# Patient Record
Sex: Female | Born: 1950 | Race: Black or African American | Hispanic: No | Marital: Married | Smoking: Former smoker
Health system: Southern US, Community
[De-identification: ages and names within clinical notes are randomized; demographics above are authoritative.]

## PROBLEM LIST (undated history)

## (undated) DIAGNOSIS — F329 Major depressive disorder, single episode, unspecified: Secondary | ICD-10-CM

## (undated) DIAGNOSIS — B1920 Unspecified viral hepatitis C without hepatic coma: Secondary | ICD-10-CM

## (undated) DIAGNOSIS — I509 Heart failure, unspecified: Secondary | ICD-10-CM

## (undated) DIAGNOSIS — G4700 Insomnia, unspecified: Secondary | ICD-10-CM

## (undated) DIAGNOSIS — I10 Essential (primary) hypertension: Secondary | ICD-10-CM

## (undated) DIAGNOSIS — F32A Depression, unspecified: Secondary | ICD-10-CM

## (undated) DIAGNOSIS — J45909 Unspecified asthma, uncomplicated: Secondary | ICD-10-CM

## (undated) DIAGNOSIS — Z9981 Dependence on supplemental oxygen: Secondary | ICD-10-CM

## (undated) HISTORY — DX: Essential (primary) hypertension: I10

## (undated) HISTORY — DX: Insomnia, unspecified: G47.00

## (undated) HISTORY — DX: Unspecified viral hepatitis C without hepatic coma: B19.20

## (undated) HISTORY — DX: Unspecified asthma, uncomplicated: J45.909

## (undated) HISTORY — PX: COLONOSCOPY W/ POLYPECTOMY: SHX1380

## (undated) HISTORY — DX: Depression, unspecified: F32.A

## (undated) HISTORY — DX: Dependence on supplemental oxygen: Z99.81

## (undated) HISTORY — DX: Heart failure, unspecified: I50.9

## (undated) HISTORY — DX: Major depressive disorder, single episode, unspecified: F32.9

## (undated) HISTORY — PX: KNEE ARTHROSCOPY: SUR90

---

## 2006-11-01 ENCOUNTER — Emergency Department (HOSPITAL_COMMUNITY): Admission: EM | Admit: 2006-11-01 | Discharge: 2006-11-01 | Payer: Self-pay | Admitting: Emergency Medicine

## 2006-12-10 ENCOUNTER — Emergency Department: Payer: Self-pay | Admitting: Emergency Medicine

## 2006-12-10 ENCOUNTER — Other Ambulatory Visit: Payer: Self-pay

## 2007-02-03 ENCOUNTER — Emergency Department (HOSPITAL_COMMUNITY): Admission: EM | Admit: 2007-02-03 | Discharge: 2007-02-03 | Payer: Self-pay | Admitting: *Deleted

## 2007-03-16 ENCOUNTER — Emergency Department (HOSPITAL_COMMUNITY): Admission: EM | Admit: 2007-03-16 | Discharge: 2007-03-16 | Payer: Self-pay | Admitting: Emergency Medicine

## 2007-03-19 ENCOUNTER — Ambulatory Visit: Payer: Self-pay | Admitting: Cardiology

## 2007-04-06 ENCOUNTER — Ambulatory Visit: Payer: Self-pay

## 2007-04-07 ENCOUNTER — Ambulatory Visit: Payer: Self-pay | Admitting: Cardiology

## 2007-06-27 ENCOUNTER — Other Ambulatory Visit: Payer: Self-pay

## 2007-06-27 ENCOUNTER — Emergency Department: Payer: Self-pay | Admitting: Emergency Medicine

## 2007-08-07 ENCOUNTER — Emergency Department: Payer: Self-pay | Admitting: Emergency Medicine

## 2007-08-07 ENCOUNTER — Other Ambulatory Visit: Payer: Self-pay

## 2007-09-21 ENCOUNTER — Emergency Department: Payer: Self-pay | Admitting: Emergency Medicine

## 2007-10-19 ENCOUNTER — Ambulatory Visit: Payer: Self-pay | Admitting: Internal Medicine

## 2007-10-20 ENCOUNTER — Ambulatory Visit: Payer: Self-pay | Admitting: Gastroenterology

## 2007-11-06 ENCOUNTER — Emergency Department: Payer: Self-pay | Admitting: Internal Medicine

## 2007-12-06 ENCOUNTER — Ambulatory Visit: Payer: Self-pay | Admitting: Gastroenterology

## 2008-04-27 ENCOUNTER — Emergency Department: Payer: Self-pay | Admitting: Emergency Medicine

## 2008-07-16 ENCOUNTER — Encounter: Payer: Self-pay | Admitting: Cardiovascular Disease

## 2008-07-16 ENCOUNTER — Emergency Department: Payer: Self-pay | Admitting: Emergency Medicine

## 2008-11-17 ENCOUNTER — Ambulatory Visit: Payer: Self-pay | Admitting: Internal Medicine

## 2009-02-06 ENCOUNTER — Encounter: Payer: Self-pay | Admitting: Emergency Medicine

## 2009-02-06 ENCOUNTER — Ambulatory Visit: Payer: Self-pay | Admitting: Radiology

## 2009-02-06 ENCOUNTER — Observation Stay (HOSPITAL_COMMUNITY): Admission: AD | Admit: 2009-02-06 | Discharge: 2009-02-07 | Payer: Self-pay | Admitting: Internal Medicine

## 2009-02-06 ENCOUNTER — Ambulatory Visit: Payer: Self-pay | Admitting: Internal Medicine

## 2009-02-07 ENCOUNTER — Encounter: Payer: Self-pay | Admitting: Cardiovascular Disease

## 2009-08-27 ENCOUNTER — Encounter: Payer: Self-pay | Admitting: Cardiovascular Disease

## 2009-08-29 ENCOUNTER — Ambulatory Visit: Payer: Self-pay | Admitting: Internal Medicine

## 2009-10-29 ENCOUNTER — Ambulatory Visit: Payer: Self-pay | Admitting: Cardiovascular Disease

## 2009-10-29 DIAGNOSIS — E785 Hyperlipidemia, unspecified: Secondary | ICD-10-CM

## 2009-10-29 DIAGNOSIS — I700 Atherosclerosis of aorta: Secondary | ICD-10-CM

## 2009-12-10 ENCOUNTER — Ambulatory Visit: Payer: Self-pay | Admitting: Internal Medicine

## 2009-12-11 ENCOUNTER — Emergency Department: Payer: Self-pay | Admitting: Emergency Medicine

## 2010-01-07 ENCOUNTER — Ambulatory Visit: Payer: Self-pay | Admitting: Gastroenterology

## 2010-02-04 ENCOUNTER — Ambulatory Visit: Payer: Self-pay

## 2010-02-15 ENCOUNTER — Inpatient Hospital Stay: Payer: Self-pay | Admitting: Family Medicine

## 2010-02-19 NOTE — Assessment & Plan Note (Signed)
Summary: NP6/AMD   Visit Type:  Initial Consult Primary Caeli Linehan:  Ruthann Cancer Anderson,M.D.  CC:  Abnormal CT of abdomen and pelvis.  Occas. has chest discomfort.Marland Kitchen  History of Present Illness: 60 year old woman with a history of smoking for 27 years who quit 5 years ago, strong family history of coronary artery disease who presents via referral from [facility] for peripheral vascular disease as seen on a CT scan. She has a history of borderline diabetes, mild obesity, hypertension, chronic hepatitis C.  She denies any symptoms of shortness of breath or chest pain. She was seen by urology for hematuria, pain and evaluation for stones. A CT scan showed atherosclerotic calcification in the aorta.   She currently feels well. She is not as active as she would like and her husband reports that she does not exercise.  A stress Myoview  in 2009 showed her to exercise for 6 minutes 35 seconds. Maximum heart rate 150, which is 92% of predicted maximum heart rate. Her MET level achieved was 7.  There were no ST-segment changes. Her ejection fraction was 78% with normal contractility and thickening in all areas of myocardium.  There was no sign of ischemia except for some minimal soft tissue attenuation in the inferior wall.   cholesterol is not available to [place] at this time  EKG shows normal sinus rhythm with rate of 84 beats per minute, no significant ST or T wave changes  Preventive Screening-Counseling & Management  Alcohol-Tobacco     Smoking Status: quit  Caffeine-Diet-Exercise     Does Patient Exercise: no      Drug Use:  no.    Current Medications (verified): 1)  Felodipine 5 Mg Xr24h-Tab (Felodipine) .... One Tablet Once Daily 2)  Losartan Potassium-Hctz 100-12.5 Mg Tabs (Losartan Potassium-Hctz) .... One Tablet Once Daily 3)  Aspir-Low 81 Mg Tbec (Aspirin) .... One Tablet Once Daily  Allergies (verified): 1)  ! Codeine  Past History:  Family History: Last updated:  10/29/2009 Family History of Coronary Artery Disease: Mother (age 53)  & Brother (age 34) deceased with MI.   Family History of Diabetes:  Family History of Hypertension:   Social History: Last updated: 10/29/2009 Tobacco Use - Former. Smoked 1PPD x 20 years. Quit 2006. Alcohol Use - yes--social Regular Exercise - no Drug Use - no Full Time  Risk Factors: Exercise: no (10/29/2009)  Risk Factors: Smoking Status: quit (10/29/2009)  Past Medical History: Hepatitis C Hypertension Diabetes Mellitus Colon Cancer  Past Surgical History: None  Family History: Family History of Coronary Artery Disease: Mother (age 41)  & Brother (age 59) deceased with MI.   Family History of Diabetes:  Family History of Hypertension:   Social History: Tobacco Use - Former. Smoked 1PPD x 20 years. Quit 2006. Alcohol Use - yes--social Regular Exercise - no Drug Use - no Full Time Smoking Status:  quit Does Patient Exercise:  no Drug Use:  no  Review of Systems  The patient denies anorexia, fever, weight loss, weight gain, vision loss, decreased hearing, hoarseness, chest pain, syncope, dyspnea on exertion, peripheral edema, prolonged cough, abdominal pain, incontinence, muscle weakness, depression, and enlarged lymph nodes.    Vital Signs:  Patient profile:   60 year old female Height:      64 inches Weight:      182 pounds BMI:     31.35 Pulse rate:   83 / minute BP sitting:   138 / 85  (left arm) Cuff size:   large  Vitals Entered By: Dolores Lory, [place] (October 29, 2009 11:49 AM)  Physical Exam  General:  Well developed, well nourished, in no acute distress. Head:  normocephalic and atraumatic Neck:  Neck supple, no JVD. No masses, thyromegaly or abnormal cervical nodes. Lungs:  Clear bilaterally to auscultation and percussion. Heart:  Non-displaced PMI, chest non-tender; regular rate and rhythm, S1, S2 without murmurs, rubs or gallops. Carotid upstroke normal, no bruit.  Pedals normal pulses. No edema, no varicosities. Abdomen:  Bowel sounds positive; abdomen soft and non-tender without masses Msk:  Back normal, normal gait. Muscle strength and tone normal. Pulses:  pulses normal in all 4 extremities Extremities:  No clubbing or cyanosis. Neurologic:  Alert and oriented x 3. Skin:  Intact without lesions or rashes. Psych:  Normal affect.   Impression & Recommendations:  Problem # 1:  AORTIC ATHEROSCLEROSIS (ICD-440.0) CT scan confirms calcific atherosclerosis. She has a long smoking history though stopped several years ago. Given her newly found peripheral vascular disease, her ideal LDL should be less than 70, total cholesterol less than 150. We'll try to obtain her most recent lipids from Dr. Frazier Richards. We will start her on simvastatin 10 mg daily titrating up to 20 mg daily after several weeks.  Problem # 2:  CVELFYBOFBPZWC-HENID (POE-423.4) She does have a very strong family history of coronary artery disease and will need to treat her aggressively. Also in light of her peripheral vascular disease, total cholesterol should be less than 150.  Her updated medication list for this problem includes:    Simvastatin 20 Mg Tabs (Simvastatin) .Marland Kitchen... Take one tablet by mouth daily at bedtime  Patient Instructions: 1)  Your physician recommends that you return for a FASTING lipid profile: January 2012 2)  Your physician has recommended you make the following change in your medication: Start simvastatin 6m take 1/2 tab for 1 month after 1 month start taking 1 tab daily  3)  Your physician recommends that you schedule a follow-up appointment in: as needed  Prescriptions: SIMVASTATIN 20 MG TABS (SIMVASTATIN) Take one tablet by mouth daily at bedtime  #30 x 12   Entered by:   ACordelia Pen RN   Authorized by:   TEsmond PlantsMD   Signed by:   ACordelia Pen RN on 10/29/2009   Method used:   Electronically to        [facility]  [address]  [facility](retail)       [address] [place]  [id number]      Ph: [phone]      Fax: [phone]  RxID:   [id number]  Appended Document: NP6/AMD I have reviewed her abdominal CT scan. This shows moderate diffuse calcific atherosclerosis through the mid to distal aorta, into the iliac arteries.   Aggressive lipid management should be continued. Screening for claudication type symptoms and possibly a lower extremity arterial ultrasound should be considered should she develop symptoms.

## 2010-04-07 LAB — COMPREHENSIVE METABOLIC PANEL
ALT: 41 U/L — ABNORMAL HIGH (ref 0–35)
AST: 51 U/L — ABNORMAL HIGH (ref 0–37)
Albumin: 3.3 g/dL — ABNORMAL LOW (ref 3.5–5.2)
Alkaline Phosphatase: 88 U/L (ref 39–117)
BUN: 20 mg/dL (ref 6–23)
GFR calc Af Amer: >60 mL/min (ref >60)
Potassium: 3.3 mEq/L — ABNORMAL LOW (ref 3.5–5.1)
Sodium: 138 mEq/L (ref 135–145)
Total Protein: 7.4 g/dL (ref 6.0–8.3)

## 2010-04-07 LAB — HEMOGLOBIN A1C: Mean Plasma Glucose: 166 mg/dL

## 2010-04-07 LAB — LIPID PANEL
Cholesterol: 104 mg/dL (ref 0–200)
Triglycerides: 56 mg/dL (ref <150)

## 2010-04-07 LAB — CARDIAC PANEL(CRET KIN+CKTOT+MB+TROPI)
CK, MB: 1.1 ng/mL (ref 0.3–4.0)
CK, MB: 1.2 ng/mL (ref 0.3–4.0)
Relative Index: INVALID (ref 0.0–2.5)
Total CK: 46 U/L (ref 7–177)
Troponin I: 0.01 ng/mL (ref 0.00–0.06)

## 2010-04-07 LAB — GLUCOSE, CAPILLARY: Glucose-Capillary: 106 mg/dL — ABNORMAL HIGH (ref 70–99)

## 2010-04-07 LAB — LIPASE, BLOOD: Lipase: 20 U/L (ref 11–59)

## 2010-04-08 LAB — CBC
RBC: 5.29 MIL/uL — ABNORMAL HIGH (ref 3.87–5.11)
WBC: 8.1 10*3/uL (ref 4.0–10.5)

## 2010-04-08 LAB — POCT CARDIAC MARKERS
CKMB, poc: <1 ng/mL — ABNORMAL LOW (ref 1.0–8.0)
Myoglobin, poc: 68.5 ng/mL (ref 12–200)
Troponin i, poc: <0.05 ng/mL (ref 0.00–0.09)

## 2010-04-08 LAB — BASIC METABOLIC PANEL WITH GFR
BUN: 20 mg/dL (ref 6–23)
CO2: 28 meq/L (ref 19–32)
Calcium: 9.7 mg/dL (ref 8.4–10.5)
Chloride: 104 meq/L (ref 96–112)
Creatinine, Ser: 0.8 mg/dL (ref 0.4–1.2)
GFR calc non Af Amer: >60 mL/min
Glucose, Bld: 97 mg/dL (ref 70–99)
Potassium: 3.7 meq/L (ref 3.5–5.1)
Sodium: 144 meq/L (ref 135–145)

## 2010-04-08 LAB — D-DIMER, QUANTITATIVE: D-Dimer, Quant: 0.3 ug/mL-FEU (ref 0.00–0.48)

## 2010-04-14 ENCOUNTER — Emergency Department: Payer: Self-pay | Admitting: Internal Medicine

## 2010-04-15 ENCOUNTER — Ambulatory Visit: Payer: Self-pay | Admitting: Gastroenterology

## 2010-06-04 NOTE — Assessment & Plan Note (Signed)
[facility] OFFICE NOTE   Stephanie Short, Stephanie Short                     MRN:          [id number]  DATE:04/07/2007                            DOB:          [birth year]    Stephanie Short returns today for follow-up concerning her chest discomfort.   A stress Myoview showed her to exercise for 6 minutes 35 seconds.  Maximum heart rate 150, which is 92% of predicted maximum heart rate.  Her MET level achieved was 7.  There were no ST-segment changes.   Her ejection fraction was 78% with normal contractility and thickening  in all areas of myocardium.  There was no sign of ischemia except for  some minimal soft tissue attenuation in the inferior wall.   I have spent about 20 minutes talking to Stephanie Short today about the  results of her stress test, exercise counseling, risk factor  modification including continuing her control of her blood pressure.  At  this point I think she only needs to see [place] back p.r.n.  She needs  primary care in the area who can follow annual blood work.     Thomas C. Verl Blalock, MD, [facility]  Electronically Signed    TCW/MedQ  DD: 04/07/2007  DT: 04/07/2007  Job #: [id number]

## 2010-06-04 NOTE — Assessment & Plan Note (Signed)
[facility] OFFICE NOTE   Stephanie Short, Stephanie Short                     MRN:          [id number]  DATE:03/19/2007                            DOB:          [birth year]    HISTORY:  I was asked by Dr. Lajean Saver to evaluate and follow with  Stephanie Short who was seen in the [facility] Emergency Room with chest  discomfort on March 16, 2007.   She is 60 years of age and just moved here from the [place] area.  She  is married and has 5 children.  She has had some intermittent chest  discomfort that is described as a dull aching pressure in the left side  of the chest.  She had some tingling in the arm at one point.  This was  not exertion related.   She does have risk factors including age, remote tobacco which she quit  in October 2005, history of recreational drug use until 1980, somewhat  sedentary lifestyle, obesity and hypertension.  She has diet-controlled  diabetes.   CURRENT MEDICATIONS:  1. Plendil 5 mg a day.  2. Lisinopril 5 mg a day.  3. Atenolol 100 mg a day.  4. Aspirin 81 mg a day.   SOCIAL HISTORY:  She no longer smokes.  She does not drink any alcohol.  She just moved to the area.  She is married.  She has 5 children.   PAST SURGICAL HISTORY:  Negative.   FAMILY HISTORY:  Negative for premature coronary artery disease, though  there is a history of stroke and coronary events in older parents and  siblings.  There is also history of diabetes and congestive heart  failure in her older father.   REVIEW OF SYSTEMS:  Other than HPI, positive for constipation, fatigue,  urinary tract problems and reflux.  She also has a problem with  insomnia.  The rest of her points of care review of systems were  reviewed and are negative.   PHYSICAL EXAMINATION:  VITAL SIGNS:  Blood pressure today is 132/86,  pulse is 60 and regular.  She is 5 feet 4 inches and weighs 170 pounds.  GENERAL:  Extremely  pleasant.  SKIN:  Warm and dry, unremarkable.  HEENT:  Normocephalic, atraumatic.  PERRL.  Extraocular movements are  intact.  Sclerae are clear.  Face symmetry is normal.  Carotid upstrokes  were equal bilaterally without bruits, no JVD.  NECK:  Supple.  Thyroid is not enlarged.  Trachea is midline.  LUNGS:  Clear.  HEART:  Reveals a regular rate and rhythm without gallop.  ABDOMEN:  Soft, good bowel sounds.  No midline bruit.  No hepatomegaly.  Organomegaly was difficult to assess.  EXTREMITIES:  No edema.  Pulses are intact.  NEURO:  Intact.   DIAGNOSTICS:  Electrocardiogram from the emergency room was reviewed as  was her blood work.  Her EKG was essentially normal except for some  nonspecific T-wave changes.  Her chest x-ray showed no acute  cardiopulmonary disease.   LABORATORY DATA:  Her hemoglobin is 17 and she again states she  does not  smoke.  I think she does admit to not drinking enough fluids.   ASSESSMENT:  1. Chest discomfort, worrisome for angina with multiple cardiac risk      factors.  2. Hypertension in good control.  3. Remote tobacco.  4. Elevated hemoglobin.  5. Question dehydration.   RECOMMENDATIONS:  1. Increase free water.  2. Increase activity to 3 hours per week.  3. Stress Myoview to rule out any obstructive coronary disease.  4. See [place] back in several weeks to discuss this and answer any other      questions.     Thomas C. Verl Blalock, MD, [facility]  Electronically Signed    TCW/MedQ  DD: 03/19/2007  DT: 03/20/2007  Job #: [id number]   cc:   Silvano Bilis. Ashok Cordia, M.D.

## 2010-07-05 ENCOUNTER — Encounter: Payer: Self-pay | Admitting: Cardiology

## 2010-07-11 ENCOUNTER — Emergency Department: Payer: Self-pay | Admitting: Emergency Medicine

## 2010-09-07 ENCOUNTER — Emergency Department: Payer: Self-pay | Admitting: *Deleted

## 2010-10-10 LAB — POCT CARDIAC MARKERS
Myoglobin, poc: 63.7
Operator id: 4295
Troponin i, poc: <0.05

## 2010-10-10 LAB — DIFFERENTIAL
Eosinophils Absolute: 0.1
Lymphocytes Relative: 18
Lymphs Abs: 1.3
Neutrophils Relative %: 72

## 2010-10-10 LAB — BASIC METABOLIC PANEL
BUN: 17
Creatinine, Ser: 0.88
GFR calc non Af Amer: >60

## 2010-10-10 LAB — CBC
Platelets: 219
WBC: 7.3

## 2010-10-11 LAB — I-STAT 8, (EC8 V) (CONVERTED LAB)
Bicarbonate: 30.6 — ABNORMAL HIGH
HCT: 52 — ABNORMAL HIGH
Hemoglobin: 17.7 — ABNORMAL HIGH
Operator id: 284251
Sodium: 140
TCO2: 32
pCO2, Ven: 55 — ABNORMAL HIGH

## 2010-10-11 LAB — DIFFERENTIAL
Basophils Absolute: 0
Basophils Relative: 1
Eosinophils Absolute: 0.1
Eosinophils Relative: 1
Lymphocytes Relative: 23
Lymphs Abs: 1.5
Monocytes Absolute: 0.6
Monocytes Relative: 10
Neutro Abs: 4.3
Neutrophils Relative %: 66

## 2010-10-11 LAB — COMPREHENSIVE METABOLIC PANEL
ALT: 50 — ABNORMAL HIGH
AST: 65 — ABNORMAL HIGH
Albumin: 3.7
CO2: 31
Calcium: 10.2
GFR calc Af Amer: >60
GFR calc non Af Amer: >60
Sodium: 142
Total Protein: 8.3

## 2010-10-11 LAB — CBC
HCT: 46.8 — ABNORMAL HIGH
Hemoglobin: 16 — ABNORMAL HIGH
MCHC: 34.1
MCV: 88.3
Platelets: 200
RBC: 5.3 — ABNORMAL HIGH
RDW: 14.1
WBC: 6.5

## 2010-10-11 LAB — POCT CARDIAC MARKERS
Myoglobin, poc: 46.1
Troponin i, poc: <0.05
Troponin i, poc: <0.05

## 2010-10-11 LAB — URINALYSIS, ROUTINE W REFLEX MICROSCOPIC
Bilirubin Urine: NEGATIVE
Glucose, UA: NEGATIVE
Hgb urine dipstick: NEGATIVE
Protein, ur: NEGATIVE
Urobilinogen, UA: 0.2

## 2010-10-11 LAB — POCT I-STAT CREATININE
Creatinine, Ser: 1.1
Operator id: 284251

## 2010-10-31 LAB — URINALYSIS, ROUTINE W REFLEX MICROSCOPIC
Glucose, UA: NEGATIVE
Ketones, ur: NEGATIVE
Nitrite: NEGATIVE
Protein, ur: NEGATIVE
pH: 6.5

## 2010-10-31 LAB — CBC
MCV: 88.1
Platelets: 201
RBC: 4.87
WBC: 6.3

## 2010-10-31 LAB — POCT CARDIAC MARKERS
CKMB, poc: <1 — ABNORMAL LOW
Myoglobin, poc: 51.4
Operator id: 2725

## 2010-10-31 LAB — BASIC METABOLIC PANEL
BUN: 14
Chloride: 105
Creatinine, Ser: 0.89
GFR calc Af Amer: >60
GFR calc non Af Amer: >60

## 2010-10-31 LAB — DIFFERENTIAL
Lymphocytes Relative: 21
Lymphs Abs: 1.3
Monocytes Relative: 10
Neutro Abs: 4.3
Neutrophils Relative %: 68

## 2010-12-06 ENCOUNTER — Emergency Department: Payer: Self-pay | Admitting: Emergency Medicine

## 2010-12-26 ENCOUNTER — Ambulatory Visit: Payer: Self-pay | Admitting: Physical Medicine and Rehabilitation

## 2011-01-21 HISTORY — PX: BACK SURGERY: SHX140

## 2011-01-28 ENCOUNTER — Ambulatory Visit: Payer: Self-pay | Admitting: Unknown Physician Specialty

## 2011-02-03 ENCOUNTER — Ambulatory Visit: Payer: Self-pay | Admitting: Unknown Physician Specialty

## 2011-02-03 LAB — URINALYSIS, COMPLETE
Bacteria: NONE SEEN
Bilirubin,UR: NEGATIVE
Ketone: NEGATIVE
RBC,UR: <1 /HPF (ref 0–5)
Specific Gravity: 1.021 (ref 1.003–1.030)
Squamous Epithelial: NONE SEEN
WBC UR: 4 /HPF (ref 0–5)

## 2011-02-03 LAB — MRSA PCR SCREENING

## 2011-02-03 LAB — CBC
HCT: 44.6 % (ref 35.0–47.0)
MCHC: 33.3 g/dL (ref 32.0–36.0)
MCV: 90 fL (ref 80–100)
RDW: 13.8 % (ref 11.5–14.5)

## 2011-02-03 LAB — BASIC METABOLIC PANEL
Anion Gap: 9 (ref 7–16)
BUN: 22 mg/dL — ABNORMAL HIGH (ref 7–18)
Calcium, Total: 10.1 mg/dL (ref 8.5–10.1)
Chloride: 105 mmol/L (ref 98–107)
Creatinine: 1.31 mg/dL — ABNORMAL HIGH (ref 0.60–1.30)
EGFR (African American): 53 — ABNORMAL LOW
EGFR (Non-African Amer.): 44 — ABNORMAL LOW
Osmolality: 290 (ref 275–301)
Potassium: 3.9 mmol/L (ref 3.5–5.1)
Sodium: 144 mmol/L (ref 136–145)

## 2011-02-13 ENCOUNTER — Ambulatory Visit: Payer: Self-pay | Admitting: Unknown Physician Specialty

## 2011-02-14 LAB — HEMOGLOBIN: HGB: 12.2 g/dL (ref 12.0–16.0)

## 2011-03-12 ENCOUNTER — Ambulatory Visit: Payer: Self-pay | Admitting: Internal Medicine

## 2011-05-22 ENCOUNTER — Emergency Department: Payer: Self-pay | Admitting: *Deleted

## 2011-05-22 LAB — CBC
HGB: 14.5 g/dL (ref 12.0–16.0)
MCH: 28.4 pg (ref 26.0–34.0)
MCHC: 32.8 g/dL (ref 32.0–36.0)
MCV: 86 fL (ref 80–100)
RBC: 5.11 10*6/uL (ref 3.80–5.20)
RDW: 14.4 % (ref 11.5–14.5)
WBC: 8 10*3/uL (ref 3.6–11.0)

## 2011-05-22 LAB — CK TOTAL AND CKMB (NOT AT ARMC)
CK, Total: 61 U/L (ref 21–215)
CK-MB: <0.5 ng/mL — ABNORMAL LOW (ref 0.5–3.6)

## 2011-05-22 LAB — COMPREHENSIVE METABOLIC PANEL
Albumin: 3.8 g/dL (ref 3.4–5.0)
Alkaline Phosphatase: 98 U/L (ref 50–136)
Anion Gap: 6 — ABNORMAL LOW (ref 7–16)
Bilirubin,Total: 0.3 mg/dL (ref 0.2–1.0)
Calcium, Total: 9.3 mg/dL (ref 8.5–10.1)
Co2: 28 mmol/L (ref 21–32)
Creatinine: 1.18 mg/dL (ref 0.60–1.30)
EGFR (African American): 58 — ABNORMAL LOW
Potassium: 4.1 mmol/L (ref 3.5–5.1)

## 2011-07-20 ENCOUNTER — Emergency Department: Payer: Self-pay | Admitting: *Deleted

## 2011-08-21 ENCOUNTER — Ambulatory Visit: Payer: Self-pay | Admitting: Pain Medicine

## 2011-09-01 ENCOUNTER — Ambulatory Visit: Payer: Self-pay | Admitting: Pain Medicine

## 2012-05-23 LAB — CBC
HCT: 42.5 % (ref 35.0–47.0)
MCH: 29.4 pg (ref 26.0–34.0)
Platelet: 220 10*3/uL (ref 150–440)
RBC: 4.93 10*6/uL (ref 3.80–5.20)
RDW: 13.7 % (ref 11.5–14.5)

## 2012-05-23 LAB — COMPREHENSIVE METABOLIC PANEL
Albumin: 4.1 g/dL (ref 3.4–5.0)
Anion Gap: 6 — ABNORMAL LOW (ref 7–16)
Chloride: 106 mmol/L (ref 98–107)
Creatinine: 0.91 mg/dL (ref 0.60–1.30)
EGFR (African American): >60
EGFR (Non-African Amer.): >60
Glucose: 91 mg/dL (ref 65–99)
Osmolality: 279 (ref 275–301)
SGOT(AST): 41 U/L — ABNORMAL HIGH (ref 15–37)
Sodium: 139 mmol/L (ref 136–145)
Total Protein: 9.1 g/dL — ABNORMAL HIGH (ref 6.4–8.2)

## 2012-05-23 LAB — TROPONIN I
Troponin-I: <0.02 ng/mL
Troponin-I: <0.02 ng/mL

## 2012-05-24 ENCOUNTER — Observation Stay: Payer: Self-pay | Admitting: Internal Medicine

## 2012-05-24 LAB — CBC WITH DIFFERENTIAL/PLATELET
Eosinophil %: 2 %
HCT: 37.7 % (ref 35.0–47.0)
Lymphocyte #: 2 10*3/uL (ref 1.0–3.6)
MCH: 29.8 pg (ref 26.0–34.0)
MCHC: 34.8 g/dL (ref 32.0–36.0)
MCV: 86 fL (ref 80–100)
Monocyte %: 9.2 %
Neutrophil %: 53.4 %
Platelet: 180 10*3/uL (ref 150–440)
RDW: 13.4 % (ref 11.5–14.5)
WBC: 5.9 10*3/uL (ref 3.6–11.0)

## 2012-05-24 LAB — COMPREHENSIVE METABOLIC PANEL
Albumin: 3.3 g/dL — ABNORMAL LOW (ref 3.4–5.0)
Alkaline Phosphatase: 91 U/L (ref 50–136)
Anion Gap: 7 (ref 7–16)
Bilirubin,Total: 0.4 mg/dL (ref 0.2–1.0)
Co2: 25 mmol/L (ref 21–32)
Potassium: 3.6 mmol/L (ref 3.5–5.1)
SGOT(AST): 31 U/L (ref 15–37)

## 2012-05-24 LAB — CK TOTAL AND CKMB (NOT AT ARMC)
CK, Total: 45 U/L (ref 21–215)
CK, Total: 48 U/L (ref 21–215)

## 2012-05-24 LAB — LIPID PANEL
Ldl Cholesterol, Calc: 38 mg/dL (ref 0–100)
VLDL Cholesterol, Calc: 13 mg/dL (ref 5–40)

## 2012-05-24 LAB — TROPONIN I: Troponin-I: <0.02 ng/mL

## 2012-05-27 ENCOUNTER — Ambulatory Visit: Payer: Self-pay | Admitting: Internal Medicine

## 2012-07-26 ENCOUNTER — Ambulatory Visit: Payer: Self-pay | Admitting: Gastroenterology

## 2012-09-16 ENCOUNTER — Ambulatory Visit: Payer: Self-pay | Admitting: Gastroenterology

## 2012-09-16 LAB — CBC WITH DIFFERENTIAL/PLATELET
Eosinophil %: 1 %
HGB: 15.3 g/dL (ref 12.0–16.0)
Lymphocyte %: 17.8 %
MCV: 85 fL (ref 80–100)
Neutrophil #: 6.4 10*3/uL (ref 1.4–6.5)
Platelet: 193 10*3/uL (ref 150–440)
RDW: 14.7 % — ABNORMAL HIGH (ref 11.5–14.5)

## 2012-09-16 LAB — PROTIME-INR
INR: 1
Prothrombin Time: 13.3 secs (ref 11.5–14.7)

## 2012-09-21 LAB — PATHOLOGY REPORT

## 2013-01-10 ENCOUNTER — Emergency Department: Payer: Self-pay | Admitting: Emergency Medicine

## 2013-01-10 LAB — COMPREHENSIVE METABOLIC PANEL
Albumin: 3.6 g/dL (ref 3.4–5.0)
Anion Gap: 7 (ref 7–16)
Bilirubin,Total: 0.3 mg/dL (ref 0.2–1.0)
Chloride: 104 mmol/L (ref 98–107)
Co2: 27 mmol/L (ref 21–32)
Creatinine: 1.13 mg/dL (ref 0.60–1.30)
Potassium: 3.9 mmol/L (ref 3.5–5.1)
Total Protein: 8.2 g/dL (ref 6.4–8.2)

## 2013-01-10 LAB — CBC
HGB: 13.6 g/dL (ref 12.0–16.0)
MCH: 29.6 pg (ref 26.0–34.0)
MCHC: 34.1 g/dL (ref 32.0–36.0)
Platelet: 192 10*3/uL (ref 150–440)
WBC: 7.7 10*3/uL (ref 3.6–11.0)

## 2013-01-11 LAB — URINALYSIS, COMPLETE
Bacteria: NONE SEEN
Bilirubin,UR: NEGATIVE
Ketone: NEGATIVE
Nitrite: NEGATIVE

## 2013-01-11 LAB — TROPONIN I: Troponin-I: <0.02 ng/mL

## 2013-03-20 ENCOUNTER — Emergency Department: Payer: Self-pay | Admitting: Emergency Medicine

## 2013-03-20 LAB — BASIC METABOLIC PANEL
Anion Gap: 6 — ABNORMAL LOW (ref 7–16)
BUN: 22 mg/dL — ABNORMAL HIGH (ref 7–18)
Calcium, Total: 10.5 mg/dL — ABNORMAL HIGH (ref 8.5–10.1)
Chloride: 102 mmol/L (ref 98–107)
Co2: 28 mmol/L (ref 21–32)
Creatinine: 1.15 mg/dL (ref 0.60–1.30)
EGFR (African American): 59 — ABNORMAL LOW
EGFR (Non-African Amer.): 51 — ABNORMAL LOW
Glucose: 98 mg/dL (ref 65–99)
Osmolality: 275 (ref 275–301)
Potassium: 4 mmol/L (ref 3.5–5.1)
Sodium: 136 mmol/L (ref 136–145)

## 2013-03-20 LAB — CBC
HCT: 42.1 % (ref 35.0–47.0)
HGB: 14.2 g/dL (ref 12.0–16.0)
MCH: 29 pg (ref 26.0–34.0)
MCHC: 33.9 g/dL (ref 32.0–36.0)
MCV: 86 fL (ref 80–100)
PLATELETS: 194 10*3/uL (ref 150–440)
RBC: 4.91 10*6/uL (ref 3.80–5.20)
RDW: 13.6 % (ref 11.5–14.5)
WBC: 7.3 10*3/uL (ref 3.6–11.0)

## 2013-03-20 LAB — TROPONIN I: Troponin-I: <0.02 ng/mL

## 2013-06-12 DIAGNOSIS — E1165 Type 2 diabetes mellitus with hyperglycemia: Secondary | ICD-10-CM | POA: Insufficient documentation

## 2013-06-12 DIAGNOSIS — J45909 Unspecified asthma, uncomplicated: Secondary | ICD-10-CM | POA: Insufficient documentation

## 2013-06-12 DIAGNOSIS — I10 Essential (primary) hypertension: Secondary | ICD-10-CM | POA: Insufficient documentation

## 2013-06-12 DIAGNOSIS — G4700 Insomnia, unspecified: Secondary | ICD-10-CM | POA: Insufficient documentation

## 2013-06-29 DIAGNOSIS — R202 Paresthesia of skin: Secondary | ICD-10-CM | POA: Insufficient documentation

## 2013-06-30 ENCOUNTER — Ambulatory Visit: Payer: Self-pay | Admitting: Internal Medicine

## 2013-07-06 ENCOUNTER — Ambulatory Visit: Payer: Self-pay | Admitting: Neurology

## 2013-10-14 ENCOUNTER — Emergency Department: Payer: Self-pay | Admitting: Emergency Medicine

## 2013-12-31 DIAGNOSIS — I700 Atherosclerosis of aorta: Secondary | ICD-10-CM | POA: Insufficient documentation

## 2014-01-11 DIAGNOSIS — F325 Major depressive disorder, single episode, in full remission: Secondary | ICD-10-CM | POA: Insufficient documentation

## 2014-05-12 NOTE — H&P (Signed)
PATIENT NAME:  Stephanie Short, Stephanie Short MR#:  [id number] DATE OF BIRTH:  03-01-1950  DATE OF ADMISSION:  05/24/2012  REFERRING PHYSICIAN:  Dr. Elyn Peers   PRIMARY CARE PHYSICIAN: Dr. Ouida Sills, Ruthann Cancer.   CHIEF COMPLAINT: Chest pain.   HISTORY OF PRESENT ILLNESS: This is a 64 year old female with significant past medical history of hypertension, borderline diabetes, anxiety disorder, remote history of tobacco abuse, who presents with complaints of chest pain. The patient reports chest pain started left-sided substernal, sharp in quality, radiating to the left arm and shoulder was accompanied by some nausea, and resolved on its own, happened at rest, no relieving or provoking factors. Denies any palpitations, sweating, shortness of breath, weakness, dizziness, lightheadedness. He has some complaints of headache, reports she had similar presentation of chest pain a couple of years ago. Reports she had negative stress test at [facility] around 18 months ago. The patient had troponin negative x2. The patient's EKG did not show any acute findings. Hospitalist services were requested to admit the patient for further evaluation for her chest pain.   PAST MEDICAL HISTORY: 1.  Borderline diabetes, not on any medications.  2.  Hypertension.  3.  Osteoarthritis.  4.  Hepatitis C.  5.  Anxiety.   SOCIAL HISTORY: The patient had history of smoking. He quit 10 years ago. No history of alcohol or illicit drug use.   FAMILY HISTORY: Significant for coronary artery disease in the family.   ALLERGIES:  1.  ACE INHIBITOR.  2.  CODEINE.  3.  DILAUDID.   4.  LOSARTAN.  5.  MORPHINE.  6.  TRAMADOL.   HOME MEDICATIONS:  1.  Spironolactone 25 mg oral daily.  2.  Xanax 0.5 as needed.  3.  Zolpidem 5 mg at bedtime.  4.  Aspirin 81 mg oral daily.  6.  Felodipine 5 mg oral daily.  7.  Milk thistle 500 mg 2 capsules daily.   REVIEW OF SYSTEMS: CONSTITUTIONAL: Patient denies any fever or chills, fatigue, weakness.   EYES: Denies blurry vision, double vision, inflammation or glaucoma.  ENT: Denies tinnitus, ear pain, hearing loss, epistaxis or discharge.  RESPIRATORY: Denies cough, wheezing, hemoptysis, dyspnea, painful respirations, chronic obstructive pulmonary disease.  CARDIOVASCULAR: Has complaints of chest pain. Denies edema, arrhythmia, palpitations, syncope.  GASTROINTESTINAL: Has complaints of nausea but denies any vomiting, diarrhea, abdominal pain, hematemesis, melena, jaundice, rectal bleed, constipation or bright red blood per rectum. GENITOURINARY: Denies dysuria, hematuria, renal colic.  ENDOCRINE:  Denies polyuria, polydipsia, heat or cold intolerance.  HEMATOLOGY: Denies anemia, easy bruising, bleeding diathesis.  INTEGUMENTARY: Denies acne, rash or skin lesions.  MUSCULOSKELETAL: Complains of chronic lower back pain. Denies any cramps, limited activity, redness, or gout. Has history of arthritis.  NEUROLOGICAL: Complains of headache but denies dysarthria, epilepsy, dementia, ataxia, memory loss, seizures, CVA, transient ischemic attack.  PSYCHIATRIC: History of anxiety and panic attacks. Denies insomnia, bipolar disorder, schizophrenia, substance abuse, alcohol abuse.   PHYSICAL EXAMINATION: VITAL SIGNS: Temperature 97.8, pulse 80, respiratory rate 15, blood pressure 139/85, saturating 96% on room air.  GENERAL: Well-nourished female who looks comfortable in bed in no apparent distress.  HEENT: Head atraumatic, normocephalic. Pupils equal, reactive to light. Pink conjunctivae. Anicteric sclerae. Moist oral mucosa.   NECK: Supple. No thyromegaly. No JVD.  CHEST: Good air entry bilaterally. No wheezing, rales, rhonchi, clear to auscultation, had no chest tenderness on palpation.  CARDIOVASCULAR: S1, S2 heard. No rubs, murmurs, or gallops.  ABDOMEN: Soft, nontender, nondistended. Bowel sounds present.  EXTREMITIES: No  edema. No clubbing. No cyanosis.  PSYCHIATRIC: Appropriate affect.  Awake, alert x3. Intact judgment and insight.  NEUROLOGIC:  Cranial nerves grossly intact. Motor five out of five.  SKIN: Normal skin turgor. Warm and dry.   PERTINENT LABORATORY DATA: Glucose 91, BUN 18, creatinine 0.91, sodium 139, potassium 4, chloride 106, ALT 37, AST 41, alkaline phosphatase 113. Troponin less than 0.02 x 2. White blood cells 7.3, hemoglobin 14.5, hematocrit 42.5, platelets 220. EKG showing normal sinus rhythm without significant ST or T wave changes at 72 beats per minute.   ASSESSMENT AND PLAN: 1.  Chest pain. The patient has a history of remote tobacco abuse, as well as history of family coronary artery disease and borderline diabetes. The patient will be admitted with telemetry monitor. We will continue to cycle her cardiac enzymes and if negative, will schedule her for Myoview stress test in a.m. The patient given 324 mg of aspirin in the ED. We will check her lipid panel, we will check her hemoglobin A1c.  2.  History of borderline diabetes, we will check a glycohemoglobin.  3.  Hypertension. Acceptable. Continue on felodipine and Aldactone.  4.  Anxiety.  Continue with p.r.n. alprazolam.  5.  Deep vein thrombosis prophylaxis, subcutaneous heparin.  6.  CODE STATUS: Full code.   Total time spent on admission and patient care: 50 minutes.    ____________________________ Albertine Patricia, MD dse:kb D: 05/24/2012 00:55:35 ET T: 05/24/2012 02:24:01 ET JOB#: [id number]  cc: Albertine Patricia, MD, <Dictator> DAWOOD Graciela Husbands MD ELECTRONICALLY SIGNED 05/24/2012 6:20

## 2014-05-14 NOTE — Op Note (Signed)
PATIENT NAME:  Stephanie Short, Stephanie Short MR#:  [id number] DATE OF BIRTH:  1950-05-21  DATE OF PROCEDURE:  01/28/2011  PREOPERATIVE DIAGNOSIS: Esophageal foreign body.   POSTOPERATIVE DIAGNOSIS: Esophageal foreign body.    OPERATION PERFORMED: Rigid esophagoscopy with removal of foreign body.   SURGEON: Roena Malady, MD    OPERATIVE FINDINGS: Two small chicken bones lodged in the cervical esophagus.   DESCRIPTION OF PROCEDURE: [name] was identified in the Emergency Room and taken to the operating room where she was placed in the supine position. After general endotracheal anesthesia, the table was turned 90 degrees. The patient is edentulous. A soft sponge was placed on the upper gum. A cervical esophagoscope was then introduced into the upper esophagus. There was an obvious foreign body lodged at approximately the level of the cricopharyngeus. Using the meat grabbers, the bones were grasped and removed in their entirety. There were two small bones which matched what was seen on the CT scan. Cervical esophagoscope was then introduced back into the cervical esophagus. There were some slight abrasions of the esophageal wall but there was no evidence of perforation and no evidence of significant ulceration. There was no residual foreign body noted. The esophagoscope was then removed. The patient was then returned to anesthesia where she was extubated in the operating room and taken to the recovery room in stable condition.   CULTURES: None.   SPECIMEN: None.   ESTIMATED BLOOD LOSS: None.   ____________________________ Roena Malady, MD ctm:drc D: 01/28/2011 18:08:47 ET T: 01/29/2011 07:44:47 ET JOB#: [id number] cc: Roena Malady, MD, <Dictator> Roena Malady MD ELECTRONICALLY SIGNED 02/13/2011 7:32

## 2014-05-14 NOTE — Discharge Summary (Signed)
PATIENT NAME:  Stephanie Short, Stephanie Short MR#:  [id number] DATE OF BIRTH:  1950/01/30  DATE OF ADMISSION:  02/13/2011 DATE OF DISCHARGE:  02/16/2011  ADMITTING DIAGNOSIS: Lumbar spinal stenosis with radiculopathy.   DISCHARGE DIAGNOSIS: Lumbar spinal stenosis with radiculopathy.    OPERATION: On 02/13/2011 the patient had a lumbar decompression at the L3-4, L4-5, and L5-S1 level.   SURGEON: Albesa Seen, MD   ASSISTANT: Reche Dixon, PA-C   ANESTHESIA: General.   ESTIMATED BLOOD LOSS: 200 mL.  REPLACEMENTS: 1700 mL of Crystalloid.   DRAINS: None.   COMPLICATIONS: None.   The patient was stabilized, brought to the recovery room, and then brought down to the orthopedic floor.   HISTORY: The patient is a 64 year old female who presented for persistent worsening pain involving her back and going down both legs. The patient was having worse on the right with her symptoms. The patient has had activity modification as well as had epidural injections with very little relief and has continued with difficulty.   PHYSICAL EXAMINATION: GENERAL: Well developed, well nourished female with a slow shuffling gait and a wide-based gait with ambulation with pain. CARDIAC: Normal. CHEST: Clear. MUSCULOSKELETAL: In regard to the lumbar spine, the patient has pain with extension but does okay with flexion. The patient has pain at the lumbosacral junction. The patient has straight leg raise positive on the right, negative on the left. This patient has mild weakness with extensor hallucis longus testing.   HOSPITAL COURSE: After initial admission on 02/13/2011, the patient was brought to the orthopedic floor. The patient had a hemoglobin the following day at 12.2 which remained stable. The patient had significant pain and was ambulating slowly, initially bed-to-chair, and progressed up to ambulating 200 feet on the day before discharge.   CONDITION AT DISCHARGE: Stable.   DISPOSITION: The patient was sent home.    DISCHARGE INSTRUCTIONS:  1. The patient will follow-up with [facility] in one week for staple removal.  2. The patient will keep her dressing on and try not to get it wet under the bandage but is allowed to shower.  3. The patient will use a regular diet.  4. The patient will call the clinic for any bright red bleeding, calf pain, bowel or bladder difficulty, or fever greater than 101.5.   DISCHARGE MEDICATIONS:  1. Resume home medication. 2. Tylenol 650 to 1000 mg every six hours as needed for pain. 3. Demerol 25 mg 1 to 2 tablets every 4 to 6 hours p.r.n. for pain if more severe.  ____________________________ Lenna Sciara. Reche Dixon, [place] jtm:drc D: 02/16/2011 06:55:29 ET T: 02/17/2011 09:52:47 ET JOB#: [id number]  cc: J. Reche Dixon, [place], <Dictator> J Effie Janoski [facility] PA ELECTRONICALLY SIGNED 02/19/2011 7:46

## 2014-05-14 NOTE — H&P (Signed)
PATIENT NAME:  Stephanie Short, GASCOIGNE MR#:  [id number] DATE OF BIRTH:  [birth year]  DATE OF ADMISSION:  01/28/2011  ER PHYSICIAN: Robb Matar, MD  ADMITTING PHYSICIAN: Beverly Gust, MD  REASON FOR ADMISSION: Foreign body in the esophagus.   HISTORY OF PRESENT ILLNESS: The patient is a 64 year old female who at lunch today was eating chicken wings and subsequently felt like a bone was lodged in her esophagus. She presented to the emergency room. She had both a diagnostic x-ray as well as a CT scan of the neck that showed a foreign body lodged transversely at approximately the level of the cricopharyngeus. She has not had anything to eat or drink since that time and is having significant dysphagia at this point.   PAST MEDICAL HISTORY: Mild hypertension; blood pressure on admission was 186/88.  MEDICATIONS: She has not taken any medications today.   REVIEW OF SYSTEMS: Review is significant only for dysphagia.   FAMILY HISTORY: Noncontributory.   PHYSICAL EXAMINATION:   HEENT: The external ears appear normal. The anterior nose is benign. The oral cavity and oropharynx are benign.  HEART: Regular rate and rhythm.   LUNGS: Clear to auscultation.  RESULTS: Review of her CT scan shows an obvious foreign body lodged in the esophagus.   IMPRESSION: Esophageal foreign body.   PLAN: We will take her to the operating room emergently for excision or removal of esophageal foreign body. She understands the risks and benefits including bleeding, infection, and esophageal perforation. She is eager to proceed. ____________________________ Roena Malady, MD ctm:slb D: 01/28/2011 17:08:55 ET T: 01/28/2011 17:39:11 ET JOB#: [id number]  cc: Roena Malady, MD, <Dictator> Roena Malady MD ELECTRONICALLY SIGNED 02/13/2011 7:32

## 2014-05-14 NOTE — Op Note (Signed)
PATIENT NAME:  Stephanie Short, Stephanie Short MR#:  [id number] DATE OF BIRTH:  Dec 05, 1950  DATE OF PROCEDURE:  02/13/2011  PREOPERATIVE DIAGNOSIS: Lumbar spinal stenosis with radiculopathy.   POSTOPERATIVE DIAGNOSIS: Lumbar spinal stenosis with radiculopathy.   PROCEDURES:  1. Lumbar decompression with laminectomy, bilateral partial facetectomy and foraminotomy L3-L4.  2. Lumbar laminectomy, decompression, bilateral facetectomy and foraminotomy L4-L5.  3. Lumbar laminectomy, decompression, bilateral facetectomy and foraminotomy L5-S1.   SURGEON: Alysia Penna. Mauri Pole, MD   ASSISTANT: Reche Dixon, PA-C.   ANESTHESIA: General.   ESTIMATED BLOOD LOSS: 200 mL.  REPLACEMENT: 1700 mL crystalloid.   DRAINS: None.   COMPLICATIONS: None.   BRIEF CLINICAL NOTE AND PATHOLOGY: The patient had persistent radicular pain with back pain, documented lumbar stenosis with foraminal narrowing. The options, risks, and benefits were discussed. The patient elected to proceed with operative intervention. At the time of the procedure, there was significant foraminal compression. There were mild bulging disks, diskectomy was not necessary at any level, and the disks were maintained for stability.   DESCRIPTION OF PROCEDURE: Preop antibiotics, adequate general anesthesia, prone position, all prominences well padded. Routine prepping and draping. Appropriate timeout was called.   Using lateral fluoroscopy, the area was marked and routine posterior incision was made. Subperiosteal dissection was performed after the fascia was incised along the tips of the spinous processes. A self-retaining retractor was placed and repositioned throughout the procedure.   The microscope was then brought onto the field, and decompression was performed with laminectomies at L3-L4, L4-L5 and L5-S1, bilateral partial facetectomies and foraminotomies. The nerve roots were carefully explored. The facet joints were undercut. It was felt that no instability  was created. The incision was thoroughly irrigated throughout and at the end of the procedure.   The entire area was covered with Surgiflo. The fascia was closed with #2 quill. The subcutaneous was closed with 0 quill, skin closed with staples. Soft sterile dressing was applied. Sponge, needle and instrument counts were reported as correct prior to and after wound closure. The patient was awakened and taken to the postanesthesia care unit having tolerated the procedure well.   ____________________________ Alysia Penna. Mauri Pole, MD jcc:cbb D: 02/14/2011 13:08:18 ET T: 02/14/2011 13:20:02 ET JOB#: [id number]  cc: Alysia Penna. Mauri Pole, MD, <Dictator> Alysia Penna Tiyanna Larcom MD ELECTRONICALLY SIGNED 02/16/2011 14:44

## 2014-05-23 DIAGNOSIS — E7800 Pure hypercholesterolemia, unspecified: Secondary | ICD-10-CM | POA: Insufficient documentation

## 2014-07-18 ENCOUNTER — Other Ambulatory Visit: Payer: Self-pay | Admitting: Internal Medicine

## 2014-07-18 DIAGNOSIS — Z1231 Encounter for screening mammogram for malignant neoplasm of breast: Secondary | ICD-10-CM

## 2014-07-27 ENCOUNTER — Ambulatory Visit
Admission: RE | Admit: 2014-07-27 | Discharge: 2014-07-27 | Disposition: A | Discharge status: Home / Self Care | Payer: 59 | Source: Ambulatory Visit | Attending: Internal Medicine | Admitting: Internal Medicine

## 2014-07-27 DIAGNOSIS — Z1231 Encounter for screening mammogram for malignant neoplasm of breast: Secondary | ICD-10-CM | POA: Insufficient documentation

## 2014-08-02 ENCOUNTER — Other Ambulatory Visit: Payer: Self-pay | Admitting: Obstetrics and Gynecology

## 2014-08-02 DIAGNOSIS — N939 Abnormal uterine and vaginal bleeding, unspecified: Secondary | ICD-10-CM

## 2014-08-02 DIAGNOSIS — N950 Postmenopausal bleeding: Secondary | ICD-10-CM | POA: Insufficient documentation

## 2014-08-07 ENCOUNTER — Ambulatory Visit
Admission: RE | Admit: 2014-08-07 | Discharge: 2014-08-07 | Disposition: A | Discharge status: Home / Self Care | Payer: 59 | Source: Ambulatory Visit | Attending: Obstetrics and Gynecology | Admitting: Obstetrics and Gynecology

## 2014-08-07 DIAGNOSIS — N939 Abnormal uterine and vaginal bleeding, unspecified: Secondary | ICD-10-CM | POA: Diagnosis not present

## 2014-08-15 ENCOUNTER — Encounter
Admission: RE | Admit: 2014-08-15 | Discharge: 2014-08-15 | Disposition: A | Discharge status: Home / Self Care | Payer: 59 | Source: Ambulatory Visit | Attending: Obstetrics and Gynecology | Admitting: Obstetrics and Gynecology

## 2014-08-15 DIAGNOSIS — I739 Peripheral vascular disease, unspecified: Secondary | ICD-10-CM | POA: Diagnosis not present

## 2014-08-15 DIAGNOSIS — Z79899 Other long term (current) drug therapy: Secondary | ICD-10-CM | POA: Diagnosis not present

## 2014-08-15 DIAGNOSIS — Z87891 Personal history of nicotine dependence: Secondary | ICD-10-CM | POA: Diagnosis not present

## 2014-08-15 DIAGNOSIS — Z01812 Encounter for preprocedural laboratory examination: Secondary | ICD-10-CM | POA: Insufficient documentation

## 2014-08-15 DIAGNOSIS — N939 Abnormal uterine and vaginal bleeding, unspecified: Secondary | ICD-10-CM | POA: Diagnosis present

## 2014-08-15 DIAGNOSIS — Z885 Allergy status to narcotic agent status: Secondary | ICD-10-CM | POA: Diagnosis not present

## 2014-08-15 DIAGNOSIS — N840 Polyp of corpus uteri: Secondary | ICD-10-CM

## 2014-08-15 DIAGNOSIS — Z888 Allergy status to other drugs, medicaments and biological substances status: Secondary | ICD-10-CM | POA: Diagnosis not present

## 2014-08-15 DIAGNOSIS — B182 Chronic viral hepatitis C: Secondary | ICD-10-CM | POA: Diagnosis not present

## 2014-08-15 DIAGNOSIS — N950 Postmenopausal bleeding: Secondary | ICD-10-CM | POA: Diagnosis not present

## 2014-08-15 DIAGNOSIS — N858 Other specified noninflammatory disorders of uterus: Secondary | ICD-10-CM | POA: Diagnosis not present

## 2014-08-15 DIAGNOSIS — K219 Gastro-esophageal reflux disease without esophagitis: Secondary | ICD-10-CM | POA: Diagnosis not present

## 2014-08-15 DIAGNOSIS — I10 Essential (primary) hypertension: Secondary | ICD-10-CM | POA: Diagnosis not present

## 2014-08-15 DIAGNOSIS — Z85038 Personal history of other malignant neoplasm of large intestine: Secondary | ICD-10-CM | POA: Diagnosis not present

## 2014-08-15 DIAGNOSIS — Z86010 Personal history of colonic polyps: Secondary | ICD-10-CM | POA: Diagnosis not present

## 2014-08-15 DIAGNOSIS — E119 Type 2 diabetes mellitus without complications: Secondary | ICD-10-CM | POA: Diagnosis not present

## 2014-08-15 LAB — CBC
HCT: 41.7 % (ref 35.0–47.0)
Hemoglobin: 13.6 g/dL (ref 12.0–16.0)
MCH: 27.6 pg (ref 26.0–34.0)
MCHC: 32.7 g/dL (ref 32.0–36.0)
MCV: 84.5 fL (ref 80.0–100.0)
Platelets: 209 10*3/uL (ref 150–440)
RBC: 4.93 MIL/uL (ref 3.80–5.20)
RDW: 14.8 % — ABNORMAL HIGH (ref 11.5–14.5)
WBC: 8.5 10*3/uL (ref 3.6–11.0)

## 2014-08-15 LAB — BASIC METABOLIC PANEL
Anion gap: 10 (ref 5–15)
BUN: 28 mg/dL — ABNORMAL HIGH (ref 6–20)
CALCIUM: 10.2 mg/dL (ref 8.9–10.3)
CO2: 26 mmol/L (ref 22–32)
Chloride: 101 mmol/L (ref 101–111)
Creatinine, Ser: 1.18 mg/dL — ABNORMAL HIGH (ref 0.44–1.00)
GFR, EST AFRICAN AMERICAN: 55 mL/min — AB (ref >60)
GFR, EST NON AFRICAN AMERICAN: 48 mL/min — AB (ref >60)
GLUCOSE: 98 mg/dL (ref 65–99)
POTASSIUM: 3.9 mmol/L (ref 3.5–5.1)
Sodium: 137 mmol/L (ref 135–145)

## 2014-08-15 NOTE — Patient Instructions (Signed)
  Your procedure is scheduled on: Friday August 18, 2014. Report to Same Day Surgery. To find out your arrival time please call [phone] between 1PM - 3PM on Thursday August 17, 2014.  Remember: Instructions that are not followed completely may result in serious medical risk, up to and including death, or upon the discretion of your surgeon and anesthesiologist your surgery may need to be rescheduled.    __x__ 1. Do not eat food or drink liquids after midnight. No gum chewing or hard candies.     __x     2. No Alcohol for 24 hours before or after surgery.   ____ 3. Bring all medications with you on the day of surgery if instructed.    __x__ 4. Notify your doctor if there is any change in your medical condition     (cold, fever, infections).     Do not wear jewelry, make-up, hairpins, clips or nail polish.  Do not wear lotions, powders, or perfumes. You may wear deodorant.  Do not shave 48 hours prior to surgery. Men may shave face and neck.  Do not bring valuables to the hospital.    [facility] is not responsible for any belongings or valuables.               Contacts, dentures or bridgework may not be worn into surgery.  Leave your suitcase in the car. After surgery it may be brought to your room.  For patients admitted to the hospital, discharge time is determined by your treatment team.   Patients discharged the day of surgery will not be allowed to drive home.    Please read over the following fact sheets that you were given:   [facility] Preparing for Surgery  __x__ Take these medicines the morning of surgery with A SIP OF WATER:    1. ranitidine (ZANTAC)    ____ Fleet Enema (as directed)   ____ Use CHG Soap as directed  ____ Use inhalers on the day of surgery  __x__ Stop metformin 2 days prior to surgery on Wednesday July 27th 2016.    ____ Take 1/2 of usual insulin dose the night before surgery and none on the morning of surgery.   _x___ Stopped aspirin on   July 24th per Dr's instructions.  ____ Stop Anti-inflammatories on does  Not apply.  OK to take Tylenol   ____ Stop supplements until after surgery.    ____ Bring C-Pap to the hospital.

## 2014-08-15 NOTE — H&P (Addendum)
Subjective:    Stephanie Short is a 64 y.o. female who presents to discuss management strategies for finding of endometrial polyp on bx. Pt had presented last week for postmenopausal vaginal bleeding.   History of diagnosis: She went through menopause 14 years ago and has never had any abnormal bleeding until 2 months ago. At that time, she felt some cramping and noticed a small amount of bleeding on her underwear. This resolved by the afternoon. No further bleeding after that time.   Pt has also endorsed some dysparunia since menopause as well as vaginal dryness.   Imaging:  TVUS: 7.18.16 6x3.0x4.1cm uterus; 22m, tiny endometrial fluid at upper uterine segment  Pathology:  7.13.16 Benign endometrial polyp fragment  Menstrual History: OB History    No data available     5 NSVDs, uncomplicated deliveries; ? H/o eclampsia   Menopause 14 yrs ago No LMP recorded. Patient is postmenopausal.   No family h/o cancer. No personal h/o cancer. No h/o ovarian cysts or fibroids. No h/o STDs.   The following portions of the patient's history were reviewed and updated as appropriate.   Past Surgical History  Procedure Laterality Date  . Knee arthroscopy Right     Past Medical History  Diagnosis Date  . Chronic hepatitis C   . Hypertension   . Diabetes mellitus     Diet controlled  . Colon cancer 2005    In a small colonic polyp  . Abnormal uterine bleeding   . Endometrial polyp    Current Outpatient Prescriptions on File Prior to Visit  Medication Sig Dispense Refill  . acetaminophen (TYLENOL EXTRA STRENGTH) 500 MG tablet Take 500 mg by mouth 2 (two) times daily as needed for Pain.    .Marland Kitchenaspirin (ENTERIC COATED ASPIRIN) 81 MG EC tablet Take 81 mg by mouth once daily.    . DULoxetine (CYMBALTA) 60 MG DR capsule Take 1 capsule (60 mg total) by mouth once daily. 30 capsule 11  . felodipine (PLENDIL) 5 MG ER tablet  TAKE 1 TABLET BY MOUTH EVERY DAY FOR BLOOD PRESSURE 90 tablet 1  . metFORMIN (GLUCOPHAGE-XR) 500 MG XR tablet Take 2 tablets (1,000 mg total) by mouth daily with dinner. 60 tablet 11  . ondansetron (ZOFRAN-ODT) 4 MG disintegrating tablet Take 1 tablet (4 mg total) by mouth every 8 (eight) hours as needed for Nausea. 15 tablet 0  . ranitidine (ZANTAC) 150 MG capsule TAKE ONE CAPSULE BY MOUTH TWICE A DAY 60 capsule 8  . spironolactone (ALDACTONE) 25 MG tablet TAKE 1 TABLET BY MOUTH EVERY MORNING 90 tablet 1  . zolpidem (AMBIEN) 10 mg tablet TAKE 1 TABLET BY MOUTH AT BEDTIME AS NEEDED 30 tablet 1   No current facility-administered medications on file prior to visit.  SH: former smoker, none currently, no drugs/alcohol Allergies  Allergen Reactions  . Ace Inhibitors Unknown  . Codeine Sulfate Unknown  . Dilaudid [Hydromorphone (Bulk)] Nausea  . Gabapentin Unknown  . Losartan-Hydrochlorothiazide Unknown  . Norco [Hydrocodone-Acetaminophen] Nausea  . Opioids - Morphine Analogues Nausea  . Percocet [Oxycodone-Acetaminophen] Nausea  . Tramadol Nausea and Vomiting    Objective:     Vitals    Filed Vitals:   08/11/14 1449  BP: 160/101  Pulse: 95  Weight: 83.915 kg (185 lb)        PHYSICAL EXAM: BP 160/101 mmHg  Pulse 95  Wt 83.915 kg (185 lb) Body mass index is 31.74 kg/(m^2). General appearance: alert, cooperative, pleasant, in no acute distress  Head: Normocephalic, atraumatic Heart: regular rate and rhythm, without murmur Lungs: clear to auscultation, without rales or wheeze, good air exchange Abdomen: soft, nondistended, nontender, no hepatosplenomegaly or masses Ext: no edema in LE bilaterally, good distal pulses  VE: no lesions on external genitalia, normal vulva; atrophied vaginal wall w/ loss of rugae, nonfriable vaginal wall; normal cervix Bimanual: No CMT, no ovarian masses, normal uterus, no  tenderness   HCM:  - pap/HPV neg 08/01/14 History of abnormal Pap smear: no. Last in 2013, NILM   Assessment:    64yo G5P5 w/ h/o HTN with postmenopausal bleeding, evidence of benign polyp on biopsy. Discussed r/b/a of polypectomy. Pt elects to proceed with D&C hysteroscopy, polypectomy in the OR.   Plan:   1. Endometrial polyp - plan for D&C hysteroscopy, polypectomy - r/b/a discussed with patient including risk of bleeding, pain, and infection. Other risks include risks associated with general anesthesia, small risk of uterine perforation, need for blood transfusion, and cessation of surgery without polypectomy due to increased fluid shifts during hysteroscopy.  - operative consent and blood transfusion consent signed - for surgical planning today - h/o HTN with values >160/110 on occasion, will need EKG  - Cat 2 Surgical risk; Class 2-3 ASA    Joylene Igo, MD     08/17/14  H&P update:  No changes  To OR for D&C hysteroscopy, polypectomy.  Lorette Ang, MD

## 2014-08-18 ENCOUNTER — Encounter
Admission: RE | Disposition: A | Discharge status: Home / Self Care | Payer: Self-pay | Source: Ambulatory Visit | Attending: Obstetrics and Gynecology

## 2014-08-18 ENCOUNTER — Ambulatory Visit
Admission: RE | Admit: 2014-08-18 | Discharge: 2014-08-18 | Disposition: A | Discharge status: Home / Self Care | Payer: 59 | Source: Ambulatory Visit | Attending: Obstetrics and Gynecology | Admitting: Obstetrics and Gynecology

## 2014-08-18 ENCOUNTER — Ambulatory Visit: Payer: 59 | Admitting: Anesthesiology

## 2014-08-18 ENCOUNTER — Encounter: Payer: Self-pay | Admitting: *Deleted

## 2014-08-18 DIAGNOSIS — Z79899 Other long term (current) drug therapy: Secondary | ICD-10-CM | POA: Insufficient documentation

## 2014-08-18 DIAGNOSIS — B182 Chronic viral hepatitis C: Secondary | ICD-10-CM | POA: Insufficient documentation

## 2014-08-18 DIAGNOSIS — I10 Essential (primary) hypertension: Secondary | ICD-10-CM | POA: Insufficient documentation

## 2014-08-18 DIAGNOSIS — Z885 Allergy status to narcotic agent status: Secondary | ICD-10-CM | POA: Insufficient documentation

## 2014-08-18 DIAGNOSIS — Z87891 Personal history of nicotine dependence: Secondary | ICD-10-CM | POA: Insufficient documentation

## 2014-08-18 DIAGNOSIS — N858 Other specified noninflammatory disorders of uterus: Secondary | ICD-10-CM | POA: Insufficient documentation

## 2014-08-18 DIAGNOSIS — I739 Peripheral vascular disease, unspecified: Secondary | ICD-10-CM | POA: Insufficient documentation

## 2014-08-18 DIAGNOSIS — Z888 Allergy status to other drugs, medicaments and biological substances status: Secondary | ICD-10-CM | POA: Insufficient documentation

## 2014-08-18 DIAGNOSIS — N840 Polyp of corpus uteri: Secondary | ICD-10-CM | POA: Insufficient documentation

## 2014-08-18 DIAGNOSIS — K219 Gastro-esophageal reflux disease without esophagitis: Secondary | ICD-10-CM | POA: Insufficient documentation

## 2014-08-18 DIAGNOSIS — N950 Postmenopausal bleeding: Secondary | ICD-10-CM | POA: Insufficient documentation

## 2014-08-18 DIAGNOSIS — Z86010 Personal history of colonic polyps: Secondary | ICD-10-CM | POA: Insufficient documentation

## 2014-08-18 DIAGNOSIS — E119 Type 2 diabetes mellitus without complications: Secondary | ICD-10-CM | POA: Insufficient documentation

## 2014-08-18 DIAGNOSIS — Z85038 Personal history of other malignant neoplasm of large intestine: Secondary | ICD-10-CM | POA: Insufficient documentation

## 2014-08-18 HISTORY — PX: HYSTEROSCOPY W/D&C: SHX1775

## 2014-08-18 LAB — TYPE AND SCREEN
ABO/RH(D): O POS
Antibody Screen: NEGATIVE

## 2014-08-18 LAB — GLUCOSE, CAPILLARY
GLUCOSE-CAPILLARY: 104 mg/dL — AB (ref 65–99)
Glucose-Capillary: 112 mg/dL — ABNORMAL HIGH (ref 65–99)

## 2014-08-18 LAB — ABO/RH: ABO/RH(D): O POS

## 2014-08-18 SURGERY — DILATATION AND CURETTAGE /HYSTEROSCOPY
Anesthesia: General

## 2014-08-18 MED ORDER — FAMOTIDINE 20 MG PO TABS
ORAL_TABLET | ORAL | Status: AC
  Administered 2014-08-18: 20 mg via ORAL
  Filled 2014-08-18: qty 1

## 2014-08-18 MED ORDER — FAMOTIDINE 20 MG PO TABS
20.0000 mg | ORAL_TABLET | Freq: Once | ORAL | Status: AC
  Administered 2014-08-18: 20 mg via ORAL

## 2014-08-18 MED ORDER — ONDANSETRON HCL 4 MG/2ML IJ SOLN
INTRAMUSCULAR | Status: AC
  Filled 2014-08-18: qty 2

## 2014-08-18 MED ORDER — LIDOCAINE HCL (CARDIAC) 20 MG/ML IV SOLN
INTRAVENOUS | Status: DC | PRN
  Administered 2014-08-18: 100 mg via INTRAVENOUS

## 2014-08-18 MED ORDER — FENTANYL CITRATE (PF) 100 MCG/2ML IJ SOLN: 25.0000 ug | INTRAMUSCULAR | Status: DC | PRN

## 2014-08-18 MED ORDER — ACETAMINOPHEN 10 MG/ML IV SOLN
INTRAVENOUS | Status: DC | PRN
  Administered 2014-08-18: 1000 mg via INTRAVENOUS

## 2014-08-18 MED ORDER — SODIUM CHLORIDE 0.9 % IV SOLN
INTRAVENOUS | Status: DC
  Administered 2014-08-18: 08:00:00 via INTRAVENOUS

## 2014-08-18 MED ORDER — ONDANSETRON HCL 4 MG/2ML IJ SOLN
4.0000 mg | Freq: Once | INTRAMUSCULAR | Status: AC | PRN
  Administered 2014-08-18: 4 mg via INTRAVENOUS

## 2014-08-18 MED ORDER — MIDAZOLAM HCL 5 MG/5ML IJ SOLN
INTRAMUSCULAR | Status: DC | PRN
  Administered 2014-08-18: 2 mg via INTRAVENOUS

## 2014-08-18 MED ORDER — ONDANSETRON HCL 4 MG/2ML IJ SOLN
INTRAMUSCULAR | Status: DC | PRN
  Administered 2014-08-18: 4 mg via INTRAVENOUS

## 2014-08-18 MED ORDER — ACETAMINOPHEN 10 MG/ML IV SOLN
INTRAVENOUS | Status: AC
  Filled 2014-08-18: qty 100

## 2014-08-18 MED ORDER — FENTANYL CITRATE (PF) 100 MCG/2ML IJ SOLN
INTRAMUSCULAR | Status: DC | PRN
  Administered 2014-08-18 (×4): 25 ug via INTRAVENOUS

## 2014-08-18 MED ORDER — PROPOFOL 10 MG/ML IV BOLUS
INTRAVENOUS | Status: DC | PRN
Start: 2014-08-18 — End: 2014-08-18
  Administered 2014-08-18: 200 mg via INTRAVENOUS

## 2014-08-18 SURGICAL SUPPLY — 17 items
CANISTER SUCT 3000ML (MISCELLANEOUS) ×2 IMPLANT
CATH ROBINSON RED A/P 16FR (CATHETERS) ×2 IMPLANT
ELECT RESECT POWERBALL 24F (MISCELLANEOUS) ×2 IMPLANT
GLOVE BIO SURGEON STRL SZ7 (GLOVE) ×8 IMPLANT
GLOVE BIOGEL PI IND STRL 7.5 (GLOVE) ×1 IMPLANT
GLOVE BIOGEL PI INDICATOR 7.5 (GLOVE) ×1
GOWN STRL REUS W/ TWL LRG LVL3 (GOWN DISPOSABLE) ×2 IMPLANT
GOWN STRL REUS W/TWL LRG LVL3 (GOWN DISPOSABLE) ×2
IV LACTATED RINGERS 1000ML (IV SOLUTION) ×2 IMPLANT
KIT RM TURNOVER CYSTO AR (KITS) ×2 IMPLANT
LOOP CUT RT ANGL 28F (MISCELLANEOUS) ×2 IMPLANT
PACK DNC HYST (MISCELLANEOUS) ×2 IMPLANT
PAD GROUND ADULT SPLIT (MISCELLANEOUS) ×2 IMPLANT
PAD OB MATERNITY 4.3X12.25 (PERSONAL CARE ITEMS) ×2 IMPLANT
PAD PREP 24X41 OB/GYN DISP (PERSONAL CARE ITEMS) ×2 IMPLANT
TUBING CONNECTING 10 (TUBING) ×2 IMPLANT
TUBING HYSTEROSCOPY DOLPHIN (MISCELLANEOUS) ×2 IMPLANT

## 2014-08-18 NOTE — Transfer of Care (Signed)
Immediate Anesthesia Transfer of Care Note  Patient: Stephanie Short  Procedure(s) Performed: Procedure(s): DILATATION AND CURETTAGE /HYSTEROSCOPY (N/A)  Patient Location: PACU  Anesthesia Type:General  Level of Consciousness: awake, alert  Airway & Oxygen Therapy: Patient Spontanous Breathing and Patient connected to face mask oxygen  Post-op Assessment: Report given to RN and Post -op Vital signs reviewed and stable  Post vital signs: Reviewed and stable  Last Vitals:  Filed Vitals:   08/18/14 0937  BP: 122/90  Pulse: 101  Temp: 35.9 C  Resp: 19    Complications: No apparent anesthesia complications

## 2014-08-18 NOTE — Discharge Instructions (Addendum)
Please take tylenol as needed for pain.  AMBULATORY SURGERY  DISCHARGE INSTRUCTIONS   1) The drugs that you were given will stay in your system until tomorrow so for the next 24 hours you should not:  A) Drive an automobile B) Make any legal decisions C) Drink any alcoholic beverage  2)  You may resume regular meals tomorrow.  Today it is better to start with liquids and gradually work up to solid foods. You may eat anything you prefer, but it is better to start with liquids, then soup and crackers, and gradually work up to solid foods.  2) Please notify your doctor immediately if you have any unusual bleeding, trouble breathing, redness and pain at the surgery site, drainage, fever, or pain not relieved by medication.  3) Additional Instructions:   Please contact your physician with any problems or Same Day Surgery at [phone], Monday through Friday 6 am to 4 pm, or [place] at [facility] number at [phone].

## 2014-08-18 NOTE — Addendum Note (Signed)
Addendum  created 08/18/14 1104 by Demetrius Charity, CRNA   Modules edited: Anesthesia Attestations

## 2014-08-18 NOTE — Op Note (Signed)
Operative Report Hysteroscopy with Dilation and Curettage   Indications: postmenopausal bleeding and endometrial polyp on biopsy  Pre-operative Diagnosis: post-menopausal bleeding, endometrial polyp  Post-operative Diagnosis: atrophic uterus  Procedure: 1. Exam under anesthesia 2. D&C 3. Hysteroscopy  Surgeon: Joylene Igo, MD  Assistant(s):  None  Anesthesia: General LMA anesthesia  Estimated Blood Loss:  Minimal         Intraoperative medications: none         Total IV Fluids: ml  Urine Output: 20 ml         Specimens: Endocervical curettings, endometrial curettings         Complications:  None; patient tolerated the procedure well.         Disposition: PACU - hemodynamically stable.         Condition: stable  Findings: Uterus measuring 7 cm by sound; normal cervix, vagina, perineum.   Indication for procedure/Consents: 76 female  here for scheduled surgery for endometrial polyp in postmenopausal woman. Risks of surgery were discussed with the patient including but not limited to: bleeding which may require transfusion; infection which may require antibiotics; injury to uterus or surrounding organs; intrauterine scarring which may impair future fertility; need for additional procedures including laparotomy or laparoscopy; and other postoperative/anesthesia complications. Written informed consent was obtained.    Procedure Details:   The patient was taken to the operating room where general anesthesia was administered and was found to be adequate. After a formal and adequate timeout was performed, she was placed in the dorsal lithotomy position and examined with the above findings. She was then prepped and draped in the sterile manner. Her bladder was catheterized for 20 ml of clear, yellow urine. A weighed speculum was then placed in the patient's vagina and a single tooth tenaculum was applied to the anterior lip of the cervix.  Her cervix was serially dilated to  21 [place] using [employer] dilators. The operative hysteroscope was introduced. Cavity was empty w/o evidence of endometrial polyp. Atrophic lining was noted. Left and Right os were visualized.  A sharp curettage was then performed until there was a gritty texture in all four quadrants. The tenaculum was removed from the anterior lip of the cervix and the vaginal speculum was removed after ensuring good hemostasis with pressure. The patient tolerated the procedure well and was taken to the recovery area awake and in stable condition. She received iv acetaminophen prior to leaving the OR.  The patient will be discharged to home as per PACU criteria. Routine postoperative instructions given. She was encouraged to use tylenol for pain given her intolerance of NSAIDS. She will follow up in the clinic in two weeks for postoperative evaluation.  Lorette Ang, MD

## 2014-08-18 NOTE — Anesthesia Preprocedure Evaluation (Signed)
Anesthesia Evaluation  Patient identified by MRN, date of birth, ID band Patient awake    Reviewed: Allergy & Precautions, NPO status , Patient's Chart, lab work & pertinent test results  History of Anesthesia Complications Negative for: history of anesthetic complications  Airway Mallampati: II  TM Distance: >3 FB Neck ROM: Full    Dental  (+) Upper Dentures, Lower Dentures   Pulmonary former smoker (quit x 10 yrs),          Cardiovascular hypertension, Pt. on medications + Peripheral Vascular Disease     Neuro/Psych    GI/Hepatic GERD-  Medicated,(+) Hepatitis - (hx, cured), C  Endo/Other  diabetes, Type 2, Oral Hypoglycemic Agents  Renal/GU      Musculoskeletal   Abdominal   Peds  Hematology   Anesthesia Other Findings   Reproductive/Obstetrics                             Anesthesia Physical Anesthesia Plan  ASA: III  Anesthesia Plan: General   Post-op Pain Management:    Induction: Intravenous  Airway Management Planned: LMA  Additional Equipment:   Intra-op Plan:   Post-operative Plan:   Informed Consent: I have reviewed the patients History and Physical, chart, labs and discussed the procedure including the risks, benefits and alternatives for the proposed anesthesia with the patient or authorized representative who has indicated his/her understanding and acceptance.     Plan Discussed with:   Anesthesia Plan Comments:         Anesthesia Quick Evaluation

## 2014-08-18 NOTE — Anesthesia Postprocedure Evaluation (Signed)
  Anesthesia Post-op Note  Patient: Stephanie Short  Procedure(s) Performed: Procedure(s): DILATATION AND CURETTAGE /HYSTEROSCOPY (N/A)  Anesthesia type:General  Patient location: PACU  Post pain: Pain level controlled  Post assessment: Post-op Vital signs reviewed, Patient's Cardiovascular Status Stable, Respiratory Function Stable, Patent Airway and No signs of Nausea or vomiting  Post vital signs: Reviewed and stable  Last Vitals:  Filed Vitals:   08/18/14 1034  BP: 132/71  Pulse: 94  Temp: 35.1 C  Resp: 16    Level of consciousness: awake, alert  and patient cooperative  Complications: No apparent anesthesia complications

## 2014-08-21 LAB — SURGICAL PATHOLOGY

## 2014-12-01 ENCOUNTER — Emergency Department: Payer: 59

## 2014-12-01 ENCOUNTER — Emergency Department
Admission: EM | Admit: 2014-12-01 | Discharge: 2014-12-01 | Disposition: A | Discharge status: Home / Self Care | Payer: 59 | Attending: Emergency Medicine | Admitting: Emergency Medicine

## 2014-12-01 ENCOUNTER — Encounter: Payer: Self-pay | Admitting: Medical Oncology

## 2014-12-01 DIAGNOSIS — Z87891 Personal history of nicotine dependence: Secondary | ICD-10-CM | POA: Insufficient documentation

## 2014-12-01 DIAGNOSIS — M25512 Pain in left shoulder: Secondary | ICD-10-CM | POA: Insufficient documentation

## 2014-12-01 DIAGNOSIS — E119 Type 2 diabetes mellitus without complications: Secondary | ICD-10-CM | POA: Diagnosis not present

## 2014-12-01 DIAGNOSIS — R110 Nausea: Secondary | ICD-10-CM | POA: Insufficient documentation

## 2014-12-01 DIAGNOSIS — R079 Chest pain, unspecified: Secondary | ICD-10-CM | POA: Diagnosis not present

## 2014-12-01 DIAGNOSIS — R1012 Left upper quadrant pain: Secondary | ICD-10-CM | POA: Insufficient documentation

## 2014-12-01 DIAGNOSIS — I10 Essential (primary) hypertension: Secondary | ICD-10-CM | POA: Insufficient documentation

## 2014-12-01 DIAGNOSIS — R1013 Epigastric pain: Secondary | ICD-10-CM | POA: Insufficient documentation

## 2014-12-01 DIAGNOSIS — R61 Generalized hyperhidrosis: Secondary | ICD-10-CM | POA: Insufficient documentation

## 2014-12-01 LAB — CBC
HCT: 39 % (ref 35.0–47.0)
Hemoglobin: 13.1 g/dL (ref 12.0–16.0)
MCH: 28.2 pg (ref 26.0–34.0)
MCHC: 33.6 g/dL (ref 32.0–36.0)
MCV: 83.8 fL (ref 80.0–100.0)
PLATELETS: 203 10*3/uL (ref 150–440)
RBC: 4.66 MIL/uL (ref 3.80–5.20)
RDW: 15.2 % — AB (ref 11.5–14.5)
WBC: 6 10*3/uL (ref 3.6–11.0)

## 2014-12-01 LAB — BASIC METABOLIC PANEL
Anion gap: 6 (ref 5–15)
BUN: 21 mg/dL — AB (ref 6–20)
CHLORIDE: 106 mmol/L (ref 101–111)
CO2: 26 mmol/L (ref 22–32)
CREATININE: 0.89 mg/dL (ref 0.44–1.00)
Calcium: 9.7 mg/dL (ref 8.9–10.3)
GFR calc Af Amer: >60 mL/min (ref >60)
GFR calc non Af Amer: >60 mL/min (ref >60)
GLUCOSE: 132 mg/dL — AB (ref 65–99)
Potassium: 4.5 mmol/L (ref 3.5–5.1)
Sodium: 138 mmol/L (ref 135–145)

## 2014-12-01 LAB — TROPONIN I
Troponin I: <0.03 ng/mL (ref <0.031)
Troponin I: <0.03 ng/mL (ref <0.031)

## 2014-12-01 LAB — LIPASE, BLOOD: Lipase: 24 U/L (ref 11–51)

## 2014-12-01 MED ORDER — MORPHINE SULFATE (PF) 4 MG/ML IV SOLN
4.0000 mg | Freq: Once | INTRAVENOUS | Status: DC
  Filled 2014-12-01: qty 1

## 2014-12-01 MED ORDER — ONDANSETRON HCL 4 MG/2ML IJ SOLN
4.0000 mg | Freq: Once | INTRAMUSCULAR | Status: AC
  Administered 2014-12-01: 4 mg via INTRAVENOUS
  Filled 2014-12-01: qty 2

## 2014-12-01 MED ORDER — IOHEXOL 350 MG/ML SOLN
100.0000 mL | Freq: Once | INTRAVENOUS | Status: AC | PRN
  Administered 2014-12-01: 100 mL via INTRAVENOUS

## 2014-12-01 MED ORDER — HYDROCODONE-ACETAMINOPHEN 5-325 MG PO TABS: 1.0000 | ORAL_TABLET | Freq: Once | ORAL | Status: DC

## 2014-12-01 NOTE — Discharge Instructions (Signed)
Abdominal Pain, Adult Many things can cause abdominal pain. Usually, abdominal pain is not caused by a disease and will improve without treatment. It can often be observed and treated at home. Your health care provider will do a physical exam and possibly order blood tests and X-rays to help determine the seriousness of your pain. However, in many cases, more time must pass before a clear cause of the pain can be found. Before that point, your health care provider may not know if you need more testing or further treatment. HOME CARE INSTRUCTIONS Monitor your abdominal pain for any changes. The following actions may help to alleviate any discomfort you are experiencing:  Only take over-the-counter or prescription medicines as directed by your health care provider.  Do not take laxatives unless directed to do so by your health care provider.  Try a clear liquid diet (broth, tea, or water) as directed by your health care provider. Slowly move to a bland diet as tolerated. SEEK MEDICAL CARE IF:  You have unexplained abdominal pain.  You have abdominal pain associated with nausea or diarrhea.  You have pain when you urinate or have a bowel movement.  You experience abdominal pain that wakes you in the night.  You have abdominal pain that is worsened or improved by eating food.  You have abdominal pain that is worsened with eating fatty foods.  You have a fever. SEEK IMMEDIATE MEDICAL CARE IF:  Your pain does not go away within 2 hours.  You keep throwing up (vomiting).  Your pain is felt only in portions of the abdomen, such as the right side or the left lower portion of the abdomen.  You pass bloody or black tarry stools. MAKE SURE YOU:  Understand these instructions.  Will watch your condition.  Will get help right away if you are not doing well or get worse.   This information is not intended to replace advice given to you by your health care provider. Make sure you discuss  any questions you have with your health care provider.   Document Released: 10/16/2004 Document Revised: 09/27/2014 Document Reviewed: 09/15/2012 Elsevier Interactive Patient Education 2016 [employer] Nonspecific Chest Pain  Chest pain can be caused by many different conditions. There is always a chance that your pain could be related to something serious, such as a heart attack or a blood clot in your lungs. Chest pain can also be caused by conditions that are not life-threatening. If you have chest pain, it is very important to follow up with your health care provider. CAUSES  Chest pain can be caused by:  Heartburn.  Pneumonia or bronchitis.  Anxiety or stress.  Inflammation around your heart (pericarditis) or lung (pleuritis or pleurisy).  A blood clot in your lung.  A collapsed lung (pneumothorax). It can develop suddenly on its own (spontaneous pneumothorax) or from trauma to the chest.  Shingles infection (varicella-zoster virus).  Heart attack.  Damage to the bones, muscles, and cartilage that make up your chest wall. This can include:  Bruised bones due to injury.  Strained muscles or cartilage due to frequent or repeated coughing or overwork.  Fracture to one or more ribs.  Sore cartilage due to inflammation (costochondritis). RISK FACTORS  Risk factors for chest pain may include:  Activities that increase your risk for trauma or injury to your chest.  Respiratory infections or conditions that cause frequent coughing.  Medical conditions or overeating that can cause heartburn.  Heart disease or family history  of heart disease.  Conditions or health behaviors that increase your risk of developing a blood clot.  Having had chicken pox (varicella zoster). SIGNS AND SYMPTOMS Chest pain can feel like:  Burning or tingling on the surface of your chest or deep in your chest.  Crushing, pressure, aching, or squeezing pain.  Dull or sharp pain that is worse  when you move, cough, or take a deep breath.  Pain that is also felt in your back, neck, shoulder, or arm, or pain that spreads to any of these areas. Your chest pain may come and go, or it may stay constant. DIAGNOSIS Lab tests or other studies may be needed to find the cause of your pain. Your health care provider may have you take a test called an ambulatory ECG (electrocardiogram). An ECG records your heartbeat patterns at the time the test is performed. You may also have other tests, such as:  Transthoracic echocardiogram (TTE). During echocardiography, sound waves are used to create a picture of all of the heart structures and to look at how blood flows through your heart.  Transesophageal echocardiogram (TEE).This is a more advanced imaging test that obtains images from inside your body. It allows your health care provider to see your heart in finer detail.  Cardiac monitoring. This allows your health care provider to monitor your heart rate and rhythm in real time.  Holter monitor. This is a portable device that records your heartbeat and can help to diagnose abnormal heartbeats. It allows your health care provider to track your heart activity for several days, if needed.  Stress tests. These can be done through exercise or by taking medicine that makes your heart beat more quickly.  Blood tests.  Imaging tests. TREATMENT  Your treatment depends on what is causing your chest pain. Treatment may include:  Medicines. These may include:  Acid blockers for heartburn.  Anti-inflammatory medicine.  Pain medicine for inflammatory conditions.  Antibiotic medicine, if an infection is present.  Medicines to dissolve blood clots.  Medicines to treat coronary artery disease.  Supportive care for conditions that do not require medicines. This may include:  Resting.  Applying heat or cold packs to injured areas.  Limiting activities until pain decreases. HOME CARE  INSTRUCTIONS  If you were prescribed an antibiotic medicine, finish it all even if you start to feel better.  Avoid any activities that bring on chest pain.  Do not use any tobacco products, including cigarettes, chewing tobacco, or electronic cigarettes. If you need help quitting, ask your health care provider.  Do not drink alcohol.  Take medicines only as directed by your health care provider.  Keep all follow-up visits as directed by your health care provider. This is important. This includes any further testing if your chest pain does not go away.  If heartburn is the cause for your chest pain, you may be told to keep your head raised (elevated) while sleeping. This reduces the chance that acid will go from your stomach into your esophagus.  Make lifestyle changes as directed by your health care provider. These may include:  Getting regular exercise. Ask your health care provider to suggest some activities that are safe for you.  Eating a heart-healthy diet. A registered dietitian can help you to learn healthy eating options.  Maintaining a healthy weight.  Managing diabetes, if necessary.  Reducing stress. SEEK MEDICAL CARE IF:  Your chest pain does not go away after treatment.  You have a rash with blisters  on your chest.  You have a fever. SEEK IMMEDIATE MEDICAL CARE IF:   Your chest pain is worse.  You have an increasing cough, or you cough up blood.  You have severe abdominal pain.  You have severe weakness.  You faint.  You have chills.  You have sudden, unexplained chest discomfort.  You have sudden, unexplained discomfort in your arms, back, neck, or jaw.  You have shortness of breath at any time.  You suddenly start to sweat, or your skin gets clammy.  You feel nauseous or you vomit.  You suddenly feel light-headed or dizzy.  Your heart begins to beat quickly, or it feels like it is skipping beats. These symptoms may represent a serious  problem that is an emergency. Do not wait to see if the symptoms will go away. Get medical help right away. Call your local emergency services (911 in the [place] Do not drive yourself to the hospital.   This information is not intended to replace advice given to you by your health care provider. Make sure you discuss any questions you have with your health care provider.   Document Released: 10/16/2004 Document Revised: 01/27/2014 Document Reviewed: 08/12/2013 Elsevier Interactive Patient Education [employer].

## 2014-12-01 NOTE — ED Notes (Signed)
Pt reports she had left sided chest pain yesterday- pain subsided but then worsened this am. Pt reports sob with pain.

## 2014-12-01 NOTE — ED Provider Notes (Signed)
[facility] Emergency Department Provider Note     Time seen: ----------------------------------------- 12:04 PM on 12/01/2014 -----------------------------------------    I have reviewed the triage vital signs and the nursing notes.   HISTORY  Chief Complaint Chest Pain    HPI Stephanie Short is a 64 y.o. female who presents ER for left-sided pain that started yesterday. Patient statesshe had epigastric and left upper quadrant pain that radiated into her chest and into her left shoulder. It was sharp, 10 out of 10, nothing made it better or worse. She states yesterday it happened briefly and then recurred today. She's never had pain like this before, currently has subsided some. Pain did radiate into her back and seemed to finally and up in her left shoulder   Past Medical History  Diagnosis Date  . Hypertension   . Diabetes mellitus   . Hepatitis C 2012    Dr. Staci Acosta pt took part in a study, now cured    Patient Active Problem List   Diagnosis Date Noted  . Endometrial polyp 08/15/2014  . HYPERLIPIDEMIA-MIXED 10/29/2009  . AORTIC ATHEROSCLEROSIS 10/29/2009    Past Surgical History  Procedure Laterality Date  . Back surgery  2013    [facility] Dr. Mauri Pole  . Hysteroscopy w/d&c N/A 08/18/2014    Procedure: DILATATION AND CURETTAGE /HYSTEROSCOPY;  Surgeon: Lorette Ang, MD;  Location: [facility] ORS;  Service: Gynecology;  Laterality: N/A;    Allergies Codeine; Dilaudid; Percocet; Tramadol; and Scallops  Social History Social History  Substance Use Topics  . Smoking status: Former Smoker -- 1.00 packs/day for 20 years    Quit date: 01/21/2004  . Smokeless tobacco: None  . Alcohol Use: 0.0 - 0.6 oz/week    0-1 Glasses of wine per week     Comment: Social    Review of Systems Constitutional: Negative for fever. Eyes: Negative for visual changes. ENT: Negative for sore throat. Cardiovascular: Positive for chest pain Respiratory:  Negative for shortness of breath. Gastrointestinal: Negative for abdominal pain, positive for nausea Genitourinary: Negative for dysuria. Musculoskeletal: Negative for back pain. Skin: Negative for rash. Positive for sweats Neurological: Negative for headaches, focal weakness or numbness.  10-point ROS otherwise negative.  ____________________________________________   PHYSICAL EXAM:  VITAL SIGNS: ED Triage Vitals  Enc Vitals Group     BP --      Pulse --      Resp --      Temp --      Temp src --      SpO2 --      Weight --      Height --      Head Cir --      Peak Flow --      Pain Score 12/01/14 1157 8     Pain Loc --      Pain Edu? --      Excl. in [place]? --     Constitutional: Alert and oriented. Well appearing and in no distress. Eyes: Conjunctivae are normal. PERRL. Normal extraocular movements. ENT   Head: Normocephalic and atraumatic.   Nose: No congestion/rhinnorhea.   Mouth/Throat: Mucous membranes are moist.   Neck: No stridor. Cardiovascular: Normal rate, regular rhythm. Normal and symmetric distal pulses are present in all extremities. No murmurs, rubs, or gallops. Respiratory: Normal respiratory effort without tachypnea nor retractions. Breath sounds are clear and equal bilaterally. No wheezes/rales/rhonchi. Gastrointestinal: Soft and nontender. No distention. No abdominal bruits.  Musculoskeletal: Nontender with normal range of  motion in all extremities. No joint effusions.  No lower extremity tenderness nor edema. Neurologic:  Normal speech and language. No gross focal neurologic deficits are appreciated. Speech is normal. No gait instability. Skin:  Skin is warm, dry and intact. No rash noted. Psychiatric: Mood and affect are normal. Speech and behavior are normal. Patient exhibits appropriate insight and judgment. ____________________________________________  EKG: Interpreted by me. Normal sinus rhythm rate 87 bpm, normal PR interval,  normal QRS with, normal QT interval. Normal axis.  ____________________________________________  ED COURSE:  Pertinent labs & imaging results that were available during my care of the patient were reviewed by me and considered in my medical decision making (see chart for details). Unclear etiology for symptoms. We'll check labs and imaging. Patient may need CT angiogram ____________________________________________    LABS (pertinent positives/negatives)  Labs Reviewed  BASIC METABOLIC PANEL - Abnormal; Notable for the following:    Glucose, Bld 132 (*)    BUN 21 (*)    All other components within normal limits  CBC - Abnormal; Notable for the following:    RDW 15.2 (*)    All other components within normal limits  TROPONIN I  LIPASE, BLOOD    RADIOLOGY Images were viewed by me  Chest x-ray, CT angiogram of the chest abdomen and pelvis IMPRESSION: No evidence of aneurysm or dissection involving the thoracoabdominal aorta.  No acute findings in the chest, abdomen or pelvis.  Mild patchy emphysematous disease and scarring which is stable.  Atherosclerotic coronary artery disease.  2 cm indeterminate hypodensity over the left renal midpole likely a cyst. Couple sub cm right renal cortical hypodensities too small to characterize but likely cysts. Recommend followup CT pre and post-contrast 6 months.  Minimal diverticulosis of the colon. ____________________________________________  FINAL ASSESSMENT AND PLAN  Abdominal pain, chest pain  Plan: Patient with labs and imaging as dictated above. No clear etiology for her symptoms, labs including troponin, lipase are all normal. She had a CT angiogram of the chest and pelvis which is normal. She was discharged with mild pain control and advised to have close follow-up for her doctor in 2 days for recheck.   Earleen Newport, MD   Earleen Newport, MD 12/01/14 [phone]

## 2014-12-01 NOTE — ED Notes (Addendum)
Pt brought in via triage w/ complaints of upper crushing abdominal pain which radiates to back, chest, left shoulder x 2 days.  Pt reports weakness, some SOB, nausea, sweating.  MD at bedside.

## 2014-12-13 ENCOUNTER — Other Ambulatory Visit: Payer: Self-pay | Admitting: Internal Medicine

## 2014-12-13 DIAGNOSIS — N281 Cyst of kidney, acquired: Secondary | ICD-10-CM

## 2014-12-19 ENCOUNTER — Ambulatory Visit: Admission: RE | Admit: 2014-12-19 | Source: Ambulatory Visit

## 2014-12-21 ENCOUNTER — Emergency Department: Payer: 59

## 2014-12-21 ENCOUNTER — Emergency Department
Admission: EM | Admit: 2014-12-21 | Discharge: 2014-12-21 | Disposition: A | Discharge status: Home / Self Care | Payer: 59 | Attending: Emergency Medicine | Admitting: Emergency Medicine

## 2014-12-21 ENCOUNTER — Encounter: Payer: Self-pay | Admitting: Emergency Medicine

## 2014-12-21 DIAGNOSIS — Z79899 Other long term (current) drug therapy: Secondary | ICD-10-CM | POA: Diagnosis not present

## 2014-12-21 DIAGNOSIS — M62838 Other muscle spasm: Secondary | ICD-10-CM | POA: Insufficient documentation

## 2014-12-21 DIAGNOSIS — I10 Essential (primary) hypertension: Secondary | ICD-10-CM | POA: Diagnosis not present

## 2014-12-21 DIAGNOSIS — Z87891 Personal history of nicotine dependence: Secondary | ICD-10-CM | POA: Diagnosis not present

## 2014-12-21 DIAGNOSIS — Z7982 Long term (current) use of aspirin: Secondary | ICD-10-CM | POA: Diagnosis not present

## 2014-12-21 DIAGNOSIS — Z7984 Long term (current) use of oral hypoglycemic drugs: Secondary | ICD-10-CM | POA: Insufficient documentation

## 2014-12-21 DIAGNOSIS — R0789 Other chest pain: Secondary | ICD-10-CM | POA: Diagnosis not present

## 2014-12-21 DIAGNOSIS — E119 Type 2 diabetes mellitus without complications: Secondary | ICD-10-CM | POA: Insufficient documentation

## 2014-12-21 DIAGNOSIS — R079 Chest pain, unspecified: Secondary | ICD-10-CM | POA: Diagnosis present

## 2014-12-21 LAB — CBC
HCT: 38.9 % (ref 35.0–47.0)
HEMOGLOBIN: 12.8 g/dL (ref 12.0–16.0)
MCH: 27.4 pg (ref 26.0–34.0)
MCHC: 32.9 g/dL (ref 32.0–36.0)
MCV: 83.3 fL (ref 80.0–100.0)
Platelets: 230 10*3/uL (ref 150–440)
RBC: 4.68 MIL/uL (ref 3.80–5.20)
RDW: 15 % — ABNORMAL HIGH (ref 11.5–14.5)
WBC: 7.3 10*3/uL (ref 3.6–11.0)

## 2014-12-21 LAB — BASIC METABOLIC PANEL
ANION GAP: 11 (ref 5–15)
BUN: 21 mg/dL — ABNORMAL HIGH (ref 6–20)
CALCIUM: 9.9 mg/dL (ref 8.9–10.3)
CO2: 22 mmol/L (ref 22–32)
CREATININE: 0.91 mg/dL (ref 0.44–1.00)
Chloride: 106 mmol/L (ref 101–111)
Glucose, Bld: 113 mg/dL — ABNORMAL HIGH (ref 65–99)
Potassium: 4.1 mmol/L (ref 3.5–5.1)
SODIUM: 139 mmol/L (ref 135–145)

## 2014-12-21 LAB — TROPONIN I

## 2014-12-21 LAB — GLUCOSE, CAPILLARY: GLUCOSE-CAPILLARY: 113 mg/dL — AB (ref 65–99)

## 2014-12-21 MED ORDER — ASPIRIN 81 MG PO CHEW
162.0000 mg | CHEWABLE_TABLET | Freq: Once | ORAL | Status: AC
  Administered 2014-12-21: 162 mg via ORAL

## 2014-12-21 MED ORDER — ASPIRIN 81 MG PO CHEW
CHEWABLE_TABLET | ORAL | Status: AC
  Filled 2014-12-21: qty 2

## 2014-12-21 NOTE — ED Provider Notes (Signed)
[facility] Emergency Department Provider Note REMINDER - THIS NOTE IS NOT A FINAL MEDICAL RECORD UNTIL IT IS SIGNED. UNTIL THEN, THE CONTENT BELOW MAY REFLECT INFORMATION FROM A DOCUMENTATION TEMPLATE, NOT THE ACTUAL PATIENT VISIT. ____________________________________________  Time seen: Approximately 4:26 PM  I have reviewed the triage vital signs and the nursing notes.   HISTORY  Chief Complaint Chest Pain    HPI Stephanie Short is a 64 y.o. female reports a previous history of hypertension, diabetes.  The patient reports that for about the last month, and occasionally even over the last several months, she will develop this severe kind of spasming feeling in her left upper abdomen told and radiated into her back and left shoulder. She reports that she had another episode today that became severe, but is "10 times better" than when it occurred about 3 weeks ago. She reports she gets his spasming pain that sort of feels like it takes her breath away and then goes away. It is not related to exertion. Today she was standing primary female.  In the present time she reports her pain is essentially almost gone, she states that her pain right now is about a 6 she is 10 times better than it was previously.  The pain is described as spasm me, and radiating up towards the left shoulder. Not associated with any nausea vomiting or diaphoresis.Patient reports that she thinks it's been due to "gas or cramps. She saw her primary care doctor after last ER visit for follow-up, and she said that there were really sure what is going on.  She reports that her last stress test was about 2 years ago with Dr. Chancy Milroy.   Past Medical History  Diagnosis Date  . Hypertension   . Diabetes mellitus   . Hepatitis C     Dr. Staci Acosta pt took part in a study, now cured    Patient Active Problem List   Diagnosis Date Noted  . Endometrial polyp 08/15/2014  . HYPERLIPIDEMIA-MIXED  10/29/2009  . AORTIC ATHEROSCLEROSIS 10/29/2009    Past Surgical History  Procedure Laterality Date  . Back surgery  2013    [facility] Dr. Mauri Pole  . Hysteroscopy w/d&c N/A 08/18/2014    Procedure: DILATATION AND CURETTAGE /HYSTEROSCOPY;  Surgeon: Lorette Ang, MD;  Location: [facility] ORS;  Service: Gynecology;  Laterality: N/A;    Current Outpatient Rx  Name  Route  Sig  Dispense  Refill  . acetaminophen (TYLENOL) 500 MG tablet   Oral   Take 1,000-1,500 mg by mouth every 6 (six) hours as needed for mild pain.         Marland Kitchen ALPRAZolam (XANAX) 0.25 MG tablet   Oral   Take 0.125-0.25 mg by mouth 2 (two) times daily as needed for anxiety or sleep.         . Ascorbic Acid (VITAMIN C PO)   Oral   Take 1 tablet by mouth at bedtime.         Marland Kitchen aspirin EC 81 MG tablet   Oral   Take 81 mg by mouth at bedtime.         . felodipine (PLENDIL) 10 MG 24 hr tablet   Oral   Take 10 mg by mouth at bedtime.         . metFORMIN (GLUCOPHAGE-XR) 500 MG 24 hr tablet   Oral   Take 1,000 mg by mouth every evening.         . ondansetron (ZOFRAN-ODT) 8  MG disintegrating tablet   Oral   Take 8 mg by mouth 3 (three) times daily as needed for nausea or vomiting.         . ranitidine (ZANTAC) 150 MG tablet   Oral   Take 150 mg by mouth 2 (two) times daily as needed for heartburn.          . spironolactone (ALDACTONE) 25 MG tablet   Oral   Take 25 mg by mouth daily.          Marland Kitchen zolpidem (AMBIEN) 10 MG tablet   Oral   Take 5-10 mg by mouth at bedtime as needed for sleep.           Allergies Codeine; Dilaudid; Percocet; Tramadol; Gabapentin; Hydrocodone-acetaminophen; Promethazine; and Scallops  Family History  Problem Relation Age of Onset  . Coronary artery disease Mother 20  . Coronary artery disease Brother 46  . Heart attack Brother     MI  . Diabetes Other   . Hypertension Other   . Colon cancer Other     Social History Social History  Substance Use Topics  .  Smoking status: Former Smoker -- 1.00 packs/day for 20 years    Quit date: 01/21/2004  . Smokeless tobacco: None  . Alcohol Use: 0.0 - 0.6 oz/week    0-1 Glasses of wine per week     Comment: Social    Review of Systems Constitutional: No fever/chills Eyes: No visual changes. ENT: No sore throat. Cardiovascular: See history of present illness Respiratory: Denies shortness of breath. Gastrointestinal: No abdominal pain.  No nausea, no vomiting.  No diarrhea.  No constipation. Genitourinary: Negative for dysuria. Musculoskeletal: Negative for back pain. Skin: Negative for rash. Neurological: Negative for headaches, focal weakness or numbness.  10-point ROS otherwise negative.  ____________________________________________   PHYSICAL EXAM:  VITAL SIGNS: ED Triage Vitals  Enc Vitals Group     BP 12/21/14 1350 130/75 mmHg     Pulse Rate 12/21/14 1350 93     Resp 12/21/14 1350 20     Temp 12/21/14 1350 97.6 F (36.4 C)     Temp Source 12/21/14 1350 Oral     SpO2 12/21/14 1350 95 %     Weight 12/21/14 1350 181 lb (82.101 kg)     Height 12/21/14 1350 5' 4.5" (1.638 m)     Head Cir --      Peak Flow --      Pain Score 12/21/14 1356 6     Pain Loc --      Pain Edu? --      Excl. in [place]? --    Constitutional: Alert and oriented. Well appearing though slightly anxious and in no acute distress. She just began her phone Moon and rhythm. Eyes: Conjunctivae are normal. PERRL. EOMI. Head: Atraumatic. Nose: No congestion/rhinnorhea. Mouth/Throat: Mucous membranes are moist.  Oropharynx non-erythematous. Neck: No stridor.   Cardiovascular: Normal rate, regular rhythm. Grossly normal heart sounds.  Good peripheral circulation. Respiratory: Normal respiratory effort.  No retractions. Lungs CTAB. Gastrointestinal: Soft and nontender. No distention. No abdominal bruits. No CVA tenderness. Musculoskeletal: No lower extremity tenderness nor edema.  No joint effusions. Neurologic:  Normal  speech and language. No gross focal neurologic deficits are appreciated. Skin:  Skin is warm, dry and intact. No rash noted. Psychiatric: Mood and affect are just slightly anxious and her friend accompanying her states that this is not unusual for. Speech and behavior are normal.  ____________________________________________   LABS (all  labs ordered are listed, but only abnormal results are displayed)  Labs Reviewed  BASIC METABOLIC PANEL - Abnormal; Notable for the following:    Glucose, Bld 113 (*)    BUN 21 (*)    All other components within normal limits  CBC - Abnormal; Notable for the following:    RDW 15.0 (*)    All other components within normal limits  GLUCOSE, CAPILLARY - Abnormal; Notable for the following:    Glucose-Capillary 113 (*)    All other components within normal limits  TROPONIN I  TROPONIN I   ____________________________________________  EKG  ED ECG REPORT I, Saniyyah Elster, the attending physician, personally viewed and interpreted this ECG.  Date: 12/21/2014 EKG Time: 1345 Rate: 85 Rhythm: normal sinus rhythm QRS Axis: normal Intervals: normal ST/T Wave abnormalities: normal Conduction Disutrbances: none Narrative Interpretation: unremarkable  ____________________________________________  RADIOLOGY  DG Chest 2 View (Final result) Result time: 12/21/14 14:36:50   Final result by Rad Results In Interface (12/21/14 14:36:50)   Narrative:   CLINICAL DATA: Central and right-sided chest pain. Intermittent shortness of breath  EXAM: CHEST 2 VIEW  COMPARISON: Chest radiograph December 01, 2014 and chest CT December 01, 2014  FINDINGS: There is slight scarring in the left base. Upper lobe emphysematous change is present, better appreciated on recent CT examination. There is no edema or consolidation. Heart size and pulmonary vascularity are normal. No adenopathy. No bone lesions. No pneumothorax.  IMPRESSION: Upper lobe emphysematous  change she is better seen on recent CT. Slight scarring left base. No edema or consolidation. No change in cardiac silhouette.     ____________________________________________   PROCEDURES  Procedure(s) performed: None  Critical Care performed: No  ____________________________________________   INITIAL IMPRESSION / ASSESSMENT AND PLAN / ED COURSE  Pertinent labs & imaging results that were available during my care of the patient were reviewed by me and considered in my medical decision making (see chart for details).  Patient is very reassuring EKG, no evidence of acute coronary disease. She is having what appears to be episodes of spasm-like crampy pain that appears to originate in the upper abdomen refer to the back. The present time her pain and symptoms are much better without intervention. Her labs them, and her clinical examination are very reassuring without evidence of significant abdominal discomfort and she reports significant improvement in her pain to a proximally 1/10 upon it was previous. She is in no distress.  The patient does have multiple risk factors for coronary disease, but her pain episodes like this seemed to be somewhat infrequent and recurrent in nature. Her troponin today is normal. I did offer her a GI cocktail, which she states she only wished for aspirin we discussed. I have called Dr. Linton Rump To discuss further recommendations as her cardiologist. Those symptoms and history are concerning, recurrent nature, improvement, and labs are quite reassuring. Notably the patient also had a CT  chest abdomen pelvis less than 1 month ago for the same symptoms that did not reveal an acute source. I do not believe repeat CT would be beneficial. ----------------------------------------- 4:36 PM on 12/21/2014 -----------------------------------------  Discussed with Dr. Chancy Milroy, reviewed clinical history EKG and he advises Isosorbide 3m once today, discharge or second  troponin is negative, and follow-up in his office at 9 AM tomorrow. Discussed with the patient and she is agreeable to the follow-up plan, careful return precautions, and she'll see Dr. KChancy Milroyat 9 AM.  ----------------------------------------- 6:25 PM on 12/21/2014 -----------------------------------------  Second  troponin is normal. Reassuring. Patient reports only minimal discomfort at this time. Given the patient's ongoing complaint of this discomfort that comes and goes over the last several months, and reassuring EKG and 2 negative troponins with her recent CT angiography I think she is appropriate for discharge and follow-up at 9 AM tomorrow with Dr. Chancy Milroy as planned. Patient is aware of the plan and careful return precautions including traditional chest pain precautions were reviewed with the patient. Because of her multiple allergies including nausea and vomiting to multiple medications, patient did not wish to take isosorbide tonight which I think is reasonable given her improvement in symptoms. ____________________________________________   FINAL CLINICAL IMPRESSION(S) / ED DIAGNOSES  Final diagnoses:  Other chest pain      Delman Kitten, MD 12/21/14 1826

## 2014-12-21 NOTE — ED Notes (Signed)
Patient presents to the ED with sharp central and right sided chest pain that began around 1pm.  Patient reports feeling short of breath and dizzy with the pain.  Patient denies shortness of breath at this time.  Patient reports feeling similar pain about 1 week ago.  Patient states she thought the pain was caused by heart burn but when she took heart burn medication the pain was not relieved.  Patient is in no obvious distress at this time.

## 2014-12-21 NOTE — Discharge Instructions (Signed)
Please follow-up with Dr. Chancy Milroy tomorrow at 9 AM.  You have been seen in the Emergency Department (ED) today for chest pain.  As we have discussed todays test results are normal, but you may require further testing.  Please follow up with the recommended doctor as instructed above in these documents regarding todays emergent visit and your recent symptoms to discuss further management.  Continue to take your regular medications. If you are not doing so already, please also take a daily baby aspirin (81 mg), at least until you follow up with your doctor.  Return to the Emergency Department (ED) if you experience any further chest pain/pressure/tightness, difficulty breathing, or sudden sweating, or other symptoms that concern you.   Chest Pain Observation It is often hard to give a specific diagnosis for the cause of chest pain. Among other possibilities your symptoms might be caused by inadequate oxygen delivery to your heart (angina). Angina that is not treated or evaluated can lead to a heart attack (myocardial infarction) or death. Blood tests, electrocardiograms, and X-rays may have been done to help determine a possible cause of your chest pain. After evaluation and observation, your health care provider has determined that it is unlikely your pain was caused by an unstable condition that requires hospitalization. However, a full evaluation of your pain may need to be completed, with additional diagnostic testing as directed. It is very important to keep your follow-up appointments. Not keeping your follow-up appointments could result in permanent heart damage, disability, or death. If there is any problem keeping your follow-up appointments, you must call your health care provider. HOME CARE INSTRUCTIONS  Due to the slight chance that your pain could be angina, it is important to follow your health care provider's treatment plan and also maintain a healthy lifestyle:  Maintain or work toward  achieving a healthy weight.  Stay physically active and exercise regularly.  Decrease your salt intake.  Eat a balanced, healthy diet. Talk to a dietitian to learn about heart-healthy foods.  Increase your fiber intake by including whole grains, vegetables, fruits, and nuts in your diet.  Avoid situations that cause stress, anger, or depression.  Take medicines as advised by your health care provider. Report any side effects to your health care provider. Do not stop medicines or adjust the dosages on your own.  Quit smoking. Do not use nicotine patches or gum until you check with your health care provider.  Keep your blood pressure, blood sugar, and cholesterol levels within normal limits.  Limit alcohol intake to no more than 1 drink per day for women who are not pregnant and 2 drinks per day for men.  Do not abuse drugs. SEEK IMMEDIATE MEDICAL CARE IF: You have severe chest pain or pressure which may include symptoms such as:  You feel pain or pressure in your arms, neck, jaw, or back.  You have severe back or abdominal pain, feel sick to your stomach (nauseous), or throw up (vomit).  You are sweating profusely.  You are having a fast or irregular heartbeat.  You feel short of breath while at rest.  You notice increasing shortness of breath during rest, sleep, or with activity.  You have chest pain that does not get better after rest or after taking your usual medicine.  You wake from sleep with chest pain.  You are unable to sleep because you cannot breathe.  You develop a frequent cough or you are coughing up blood.  You feel dizzy, faint, or  experience extreme fatigue.  You develop severe weakness, dizziness, fainting, or chills. Any of these symptoms may represent a serious problem that is an emergency. Do not wait to see if the symptoms will go away. Call your local emergency services (911 in the [place] Do not drive yourself to the hospital. MAKE SURE  YOU:  Understand these instructions.  Will watch your condition.  Will get help right away if you are not doing well or get worse.   This information is not intended to replace advice given to you by your health care provider. Make sure you discuss any questions you have with your health care provider.   Document Released: 02/08/2010 Document Revised: 01/11/2013 Document Reviewed: 07/08/2012 Elsevier Interactive Patient Education [employer].

## 2014-12-23 ENCOUNTER — Encounter: Payer: Self-pay | Admitting: *Deleted

## 2014-12-23 ENCOUNTER — Emergency Department: Payer: 59

## 2014-12-23 ENCOUNTER — Emergency Department
Admission: EM | Admit: 2014-12-23 | Discharge: 2014-12-23 | Disposition: A | Discharge status: Home / Self Care | Payer: 59 | Attending: Emergency Medicine | Admitting: Emergency Medicine

## 2014-12-23 DIAGNOSIS — R002 Palpitations: Secondary | ICD-10-CM | POA: Diagnosis not present

## 2014-12-23 DIAGNOSIS — Z7984 Long term (current) use of oral hypoglycemic drugs: Secondary | ICD-10-CM | POA: Diagnosis not present

## 2014-12-23 DIAGNOSIS — T463X5A Adverse effect of coronary vasodilators, initial encounter: Secondary | ICD-10-CM | POA: Diagnosis not present

## 2014-12-23 DIAGNOSIS — Z79899 Other long term (current) drug therapy: Secondary | ICD-10-CM | POA: Diagnosis not present

## 2014-12-23 DIAGNOSIS — R51 Headache: Secondary | ICD-10-CM | POA: Diagnosis not present

## 2014-12-23 DIAGNOSIS — Z7982 Long term (current) use of aspirin: Secondary | ICD-10-CM | POA: Diagnosis not present

## 2014-12-23 DIAGNOSIS — E119 Type 2 diabetes mellitus without complications: Secondary | ICD-10-CM | POA: Diagnosis not present

## 2014-12-23 DIAGNOSIS — I10 Essential (primary) hypertension: Secondary | ICD-10-CM | POA: Diagnosis not present

## 2014-12-23 DIAGNOSIS — T50905A Adverse effect of unspecified drugs, medicaments and biological substances, initial encounter: Secondary | ICD-10-CM

## 2014-12-23 DIAGNOSIS — F419 Anxiety disorder, unspecified: Secondary | ICD-10-CM | POA: Insufficient documentation

## 2014-12-23 DIAGNOSIS — Z87891 Personal history of nicotine dependence: Secondary | ICD-10-CM | POA: Insufficient documentation

## 2014-12-23 LAB — BASIC METABOLIC PANEL
Anion gap: 11 (ref 5–15)
BUN: 20 mg/dL (ref 6–20)
CALCIUM: 9.4 mg/dL (ref 8.9–10.3)
CO2: 22 mmol/L (ref 22–32)
CREATININE: 1.02 mg/dL — AB (ref 0.44–1.00)
Chloride: 105 mmol/L (ref 101–111)
GFR calc Af Amer: >60 mL/min (ref >60)
GFR, EST NON AFRICAN AMERICAN: 56 mL/min — AB (ref >60)
Glucose, Bld: 148 mg/dL — ABNORMAL HIGH (ref 65–99)
POTASSIUM: 4 mmol/L (ref 3.5–5.1)
SODIUM: 138 mmol/L (ref 135–145)

## 2014-12-23 LAB — CBC
HCT: 38 % (ref 35.0–47.0)
Hemoglobin: 12.5 g/dL (ref 12.0–16.0)
MCH: 27.8 pg (ref 26.0–34.0)
MCHC: 32.9 g/dL (ref 32.0–36.0)
MCV: 84.3 fL (ref 80.0–100.0)
Platelets: 228 10*3/uL (ref 150–440)
RBC: 4.51 MIL/uL (ref 3.80–5.20)
RDW: 15.3 % — ABNORMAL HIGH (ref 11.5–14.5)
WBC: 9.5 10*3/uL (ref 3.6–11.0)

## 2014-12-23 LAB — TROPONIN I: Troponin I: 0.03 ng/mL

## 2014-12-23 MED ORDER — LORAZEPAM 2 MG PO TABS
2.0000 mg | ORAL_TABLET | Freq: Once | ORAL | Status: AC
  Administered 2014-12-23: 2 mg via ORAL
  Filled 2014-12-23: qty 1

## 2014-12-23 MED ORDER — ACETAMINOPHEN 500 MG PO TABS
1000.0000 mg | ORAL_TABLET | Freq: Once | ORAL | Status: AC
  Administered 2014-12-23: 1000 mg via ORAL

## 2014-12-23 MED ORDER — ACETAMINOPHEN 500 MG PO TABS
ORAL_TABLET | ORAL | Status: AC
  Administered 2014-12-23: 1000 mg via ORAL
  Filled 2014-12-23: qty 2

## 2014-12-23 NOTE — ED Provider Notes (Signed)
Time Seen: Approximately 74  I have reviewed the triage notes  Chief Complaint: Medication Reaction   History of Present Illness: Stephanie Short is a 64 y.o. female who presents after taking isosorbide that was recently prescribed by her cardiologist. The patient's been evaluated on an outpatient basis for the possibility of acute coronary syndrome and has an upcoming stress echocardiogram planned. She saw a cardiologist after evaluation here in emergency department for chest discomfort and was started on the isosorbide. She states she took all her regular medications last evening at 11:00 and then took the isosorbide at 2 AM. She describes after approximately 15-20 minutes a headache and felt like her heart was racing with heart palpitations. Old shortness of breath and denies any focal weakness in either upper or lower extremities, no nausea or vomiting, no fever or cough, etc.   Past Medical History  Diagnosis Date  . Hypertension   . Diabetes mellitus   . Hepatitis C     Dr. Staci Acosta pt took part in a study, now cured    Patient Active Problem List   Diagnosis Date Noted  . Endometrial polyp 08/15/2014  . HYPERLIPIDEMIA-MIXED 10/29/2009  . AORTIC ATHEROSCLEROSIS 10/29/2009    Past Surgical History  Procedure Laterality Date  . Back surgery  2013    [facility] Dr. Mauri Pole  . Hysteroscopy w/d&c N/A 08/18/2014    Procedure: DILATATION AND CURETTAGE /HYSTEROSCOPY;  Surgeon: Lorette Ang, MD;  Location: [facility] ORS;  Service: Gynecology;  Laterality: N/A;    Past Surgical History  Procedure Laterality Date  . Back surgery  2013    [facility] Dr. Mauri Pole  . Hysteroscopy w/d&c N/A 08/18/2014    Procedure: DILATATION AND CURETTAGE /HYSTEROSCOPY;  Surgeon: Lorette Ang, MD;  Location: [facility] ORS;  Service: Gynecology;  Laterality: N/A;    Current Outpatient Rx  Name  Route  Sig  Dispense  Refill  . acetaminophen (TYLENOL) 500 MG tablet   Oral   Take 1,000-1,500 mg by mouth every 6  (six) hours as needed for mild pain.         Marland Kitchen ALPRAZolam (XANAX) 0.25 MG tablet   Oral   Take 0.125-0.25 mg by mouth 2 (two) times daily as needed for anxiety or sleep.         . Ascorbic Acid (VITAMIN C PO)   Oral   Take 1 tablet by mouth at bedtime.         Marland Kitchen aspirin EC 81 MG tablet   Oral   Take 81 mg by mouth at bedtime.         . felodipine (PLENDIL) 10 MG 24 hr tablet   Oral   Take 10 mg by mouth at bedtime.         . metFORMIN (GLUCOPHAGE-XR) 500 MG 24 hr tablet   Oral   Take 1,000 mg by mouth every evening.         . ondansetron (ZOFRAN-ODT) 8 MG disintegrating tablet   Oral   Take 8 mg by mouth 3 (three) times daily as needed for nausea or vomiting.         . ranitidine (ZANTAC) 150 MG tablet   Oral   Take 150 mg by mouth 2 (two) times daily as needed for heartburn.          . spironolactone (ALDACTONE) 25 MG tablet   Oral   Take 25 mg by mouth daily.          Marland Kitchen zolpidem (  AMBIEN) 10 MG tablet   Oral   Take 5-10 mg by mouth at bedtime as needed for sleep.           Allergies:  Codeine; Dilaudid; Percocet; Tramadol; Gabapentin; Hydrocodone-acetaminophen; Promethazine; and Scallops  Family History: Family History  Problem Relation Age of Onset  . Coronary artery disease Mother 36  . Coronary artery disease Brother 38  . Heart attack Brother     MI  . Diabetes Other   . Hypertension Other   . Colon cancer Other     Social History: Social History  Substance Use Topics  . Smoking status: Former Smoker -- 1.00 packs/day for 20 years    Quit date: 01/21/2004  . Smokeless tobacco: None  . Alcohol Use: 0.0 - 0.6 oz/week    0-1 Glasses of wine per week     Comment: Social     Review of Systems:   10 point review of systems was performed and was otherwise negative:  Constitutional: No fever Eyes: No visual disturbances ENT: No sore throat, ear pain Cardiac: No chest pain Respiratory: No current shortness of breath Abdomen:  No abdominal pain, no vomiting, No diarrhea Endocrine: No weight loss, No night sweats Extremities: No peripheral edema, cyanosis Skin: No rashes, easy bruising Neurologic: No focal weakness, trouble with speech or swollowing Urologic: No dysuria, Hematuria, or urinary frequency Her husband states that she seemed to be fairly anxious at home when her symptoms started.  Physical Exam:  ED Triage Vitals  Enc Vitals Group     BP 12/23/14 0422 134/70 mmHg     Pulse Rate 12/23/14 0422 107     Resp 12/23/14 0422 20     Temp 12/23/14 0422 97.5 F (36.4 C)     Temp Source 12/23/14 0422 Oral     SpO2 12/23/14 0422 92 %     Weight 12/23/14 0422 181 lb (82.101 kg)     Height 12/23/14 0422 _0  (1.626 m)     Head Cir --      Peak Flow --      Pain Score 12/23/14 0423 0     Pain Loc --      Pain Edu? --      Excl. in [place]? --     General: Awake , Alert , and Oriented times 3; GCS 15. Anxious Head: Normal cephalic , atraumatic Eyes: Pupils equal , round, reactive to light Nose/Throat: No nasal drainage, patent upper airway without erythema or exudate.  Neck: Supple, Full range of motion, No anterior adenopathy or palpable thyroid masses Lungs: Clear to ascultation without wheezes , rhonchi, or rales Heart: Regular rate, regular rhythm without murmurs , gallops , or rubs Abdomen: Soft, non tender without rebound, guarding , or rigidity; bowel sounds positive and symmetric in all 4 quadrants. No organomegaly .        Extremities: 2 plus symmetric pulses. No edema, clubbing or cyanosis Neurologic: normal ambulation, Motor symmetric without deficits, sensory intact Skin: warm, dry, no rashes   Labs:   All laboratory work was reviewed including any pertinent negatives or positives listed below:  Labs Reviewed  BASIC METABOLIC PANEL - Abnormal; Notable for the following:    Glucose, Bld 148 (*)    Creatinine, Ser 1.02 (*)    GFR calc non Af Amer 56 (*)    All other components within  normal limits  CBC - Abnormal; Notable for the following:    RDW 15.3 (*)    All  other components within normal limits  TROPONIN I   reviewed laboratory work showed no significant findings  EKG:  ED ECG REPORT I, Daymon Larsen, the attending physician, personally viewed and interpreted this ECG.  Date: 12/23/2014 EKG Time: 0450 Rate: *100 Rhythm: normal sinus rhythm QRS Axis: normal Intervals: normal ST/T Wave abnormalities: normal Conduction Disutrbances: none Narrative Interpretation: unremarkable Large artifact   Radiology:  last night. History of hypertension, diabetes, hepatitis-C.  EXAM: CHEST 2 VIEW  COMPARISON: Chest radiograph December 21, 2014 and CT chest December 01, 2014  FINDINGS: Cardiomediastinal silhouette is normal. RIGHT upper lobe scarring. Similar strandy densities LEFT lung base. The lungs are clear without pleural effusions or focal consolidations. Trachea projects midline and there is no pneumothorax. Soft tissue planes and included osseous structures are non-suspicious.  IMPRESSION: Similar LEFT lung base atelectasis/ scarring.   I personally reviewed the radiologic studies     ED Course:  Patient's stay was uneventful and she was given Tylenol for headache and Ativan by mouth. Her heart rate was monitored while here in emergency department she remained in normal sinus rhythm heart rate gradually decreased to the upper 80s and low 90s prior to discharge. She feels symptomatically improved and I felt there was unlikely to be any kind of true allergic reaction to the medication is most likely would be a side effect. She was advised touch base with her cardiologist office for either adjustment of the dosage of the medication or change to another form of ongoing nitroglycerin therapy. He denies any chest pain throughout this particular visit and otherwise her troponin, EKG, appears stable.    Assessment: Medication adverse effect   Final  Clinical Impression:   Final diagnoses:  Medication adverse effect, initial encounter     Plan:  Outpatient management Patient was advised to return immediately if condition worsens. Patient was advised to follow up with her primary care physician or other specialized physicians involved in their outpatient care             Daymon Larsen, MD 12/23/14 [phone]

## 2014-12-23 NOTE — ED Notes (Signed)
MD at bedside. 

## 2014-12-23 NOTE — ED Notes (Signed)
Pt presents w/ c/o heart racing and shortness of breath, denies chest pain. Pt RX'd isosorbide mononitrate and this is the first dose she has taken. Pt in no acute distress at this time.

## 2014-12-23 NOTE — Discharge Instructions (Signed)
Drug Toxicity Drug toxicity refers to harmful and unwanted (adverse) effects of a drug in your body. Drug toxicity often results from taking too much of a drug (overdose) by accident or on purpose. With some drugs, there is only a small difference between the dose that is needed to treat your condition and a dose that is harmful (narrow therapeutic range). However, any drug can be toxic at high doses, and even normal doses of certain drugs can be toxic for some people. These include over-the-counter (OTC) medicines. Drug toxicity can happen suddenly when you first start taking a drug or when you suddenly take too much of a drug (acute toxicity). It can also happen as a result of taking a drug for a long period of time (chronic toxicity). The effects of drug toxicity can be mild, dangerous, or even deadly. CAUSES Many things can cause drug toxicity. Common causes of acute toxicity include a drug overdose or an allergic reaction to a drug. Most drugs are broken down (metabolized) by your liver and eliminated (excreted) by your kidneys. Chronic drug toxicity can result from changes in the way that your body metabolizes a drug. This can happen, for example, if you weigh less than you did when you started taking a drug but you keep taking the same dose that you took at the heavier weight. RISK FACTORS You may have a higher risk for drug toxicity if you:  Are under 70 years of age or over 69 years of age.  Have liver disease, kidney disease, or another medical condition.  Are taking more than one drug.  Are pregnant.  Are allergic to certain drugs.  Have genes that cause you to be more affected by (susceptible to) certain drugs.  Take a drug that has a narrow therapeutic range. Certain types of drugs are more likely than others to cause toxicity. Many drugs have a narrow therapeutic range, including:  Blood thinners.  Heart medicines.  Diabetes medicines.  Medicines to prevent or stop  seizures.  Theophylline for asthma.  Lithium for bipolar disorder. SYMPTOMS Signs and symptoms of drug toxicity depend on the drug and the amount that was taken. They may start suddenly or develop gradually over time. DIAGNOSIS Drug toxicity may be diagnosed based on your symptoms. Some drugs have known side effects that suggest toxicity. It is important that you tell health care provider about all of the drugs that you are taking and whether you have ever had a reaction to a drug. Your health care provider will do a physical exam. You may have tests to check for drug toxicity, including:  Blood tests to measure the amount of the drug in your blood or to check for signs of kidney or liver damage.  Urine tests.  Other tests to check for organ damage. TREATMENT Treatment may include:  Stopping the drug.  Lowering the dose of the drug.  Switching to a different drug. You may also need treatment to stop or reverse the effects of the toxicity. These treatments depend on the drug that caused the toxicity, how severe the toxicity is, and which parts of your body are affected. HOME CARE INSTRUCTIONS  Take medicines only as directed by your health care provider. Always ask your health care provider to discuss the possible side effects of any new drug that you start taking.  Keep a list of all of the drugs that you take, including over-the-counter medicines. Bring this list with you to all of your medical visits.  Read  the drug inserts that come with your medicines.  Keep all follow-up visits as directed by your health care provider. This is important. SEEK MEDICAL CARE IF:  Your symptoms return.  You develop any new signs or symptoms when you are taking medicines.  You notice any signs that indicate that you are taking too much of your medicine, based on what your health care provider told you to watch for. SEEK IMMEDIATE MEDICAL CARE IF:  You have chest pain.  You have difficulty  breathing.  You have a loss of consciousness.   This information is not intended to replace advice given to you by your health care provider. Make sure you discuss any questions you have with your health care provider.   Document Released: 01/06/2005 Document Revised: 05/23/2014 Document Reviewed: 01/11/2014 Elsevier Interactive Patient Education [employer].

## 2014-12-23 NOTE — ED Notes (Signed)
Patient with no complaints at this time. Respirations even and unlabored. Skin warm/dry. Discharge instructions reviewed with patient at this time. Patient given opportunity to voice concerns/ask questions. IV removed per policy and band-aid applied to site. Patient discharged at this time and left Emergency Department with steady gait.  

## 2015-01-31 ENCOUNTER — Encounter: Payer: Self-pay | Admitting: Pain Medicine

## 2015-01-31 ENCOUNTER — Ambulatory Visit: Payer: 59 | Attending: Pain Medicine | Admitting: Pain Medicine

## 2015-01-31 ENCOUNTER — Other Ambulatory Visit: Payer: Self-pay | Admitting: Pain Medicine

## 2015-01-31 VITALS — BP 140/69 | HR 98 | Temp 98.0°F | Resp 18 | Ht 63.5 in | Wt 184.0 lb

## 2015-01-31 DIAGNOSIS — I129 Hypertensive chronic kidney disease with stage 1 through stage 4 chronic kidney disease, or unspecified chronic kidney disease: Secondary | ICD-10-CM | POA: Insufficient documentation

## 2015-01-31 DIAGNOSIS — I700 Atherosclerosis of aorta: Secondary | ICD-10-CM | POA: Insufficient documentation

## 2015-01-31 DIAGNOSIS — M8580 Other specified disorders of bone density and structure, unspecified site: Secondary | ICD-10-CM

## 2015-01-31 DIAGNOSIS — F1190 Opioid use, unspecified, uncomplicated: Secondary | ICD-10-CM | POA: Diagnosis not present

## 2015-01-31 DIAGNOSIS — M961 Postlaminectomy syndrome, not elsewhere classified: Secondary | ICD-10-CM | POA: Insufficient documentation

## 2015-01-31 DIAGNOSIS — M7062 Trochanteric bursitis, left hip: Secondary | ICD-10-CM | POA: Diagnosis not present

## 2015-01-31 DIAGNOSIS — M533 Sacrococcygeal disorders, not elsewhere classified: Secondary | ICD-10-CM | POA: Diagnosis not present

## 2015-01-31 DIAGNOSIS — M539 Dorsopathy, unspecified: Secondary | ICD-10-CM | POA: Diagnosis not present

## 2015-01-31 DIAGNOSIS — M545 Low back pain, unspecified: Secondary | ICD-10-CM

## 2015-01-31 DIAGNOSIS — Z5181 Encounter for therapeutic drug level monitoring: Secondary | ICD-10-CM | POA: Insufficient documentation

## 2015-01-31 DIAGNOSIS — F1120 Opioid dependence, uncomplicated: Secondary | ICD-10-CM

## 2015-01-31 DIAGNOSIS — G893 Neoplasm related pain (acute) (chronic): Secondary | ICD-10-CM

## 2015-01-31 DIAGNOSIS — M25559 Pain in unspecified hip: Secondary | ICD-10-CM | POA: Diagnosis present

## 2015-01-31 DIAGNOSIS — M160 Bilateral primary osteoarthritis of hip: Secondary | ICD-10-CM | POA: Diagnosis not present

## 2015-01-31 DIAGNOSIS — Z87891 Personal history of nicotine dependence: Secondary | ICD-10-CM | POA: Insufficient documentation

## 2015-01-31 DIAGNOSIS — G8929 Other chronic pain: Secondary | ICD-10-CM | POA: Diagnosis not present

## 2015-01-31 DIAGNOSIS — Z79891 Long term (current) use of opiate analgesic: Secondary | ICD-10-CM

## 2015-01-31 DIAGNOSIS — N183 Chronic kidney disease, stage 3 unspecified: Secondary | ICD-10-CM

## 2015-01-31 DIAGNOSIS — M792 Neuralgia and neuritis, unspecified: Secondary | ICD-10-CM

## 2015-01-31 DIAGNOSIS — Z79899 Other long term (current) drug therapy: Secondary | ICD-10-CM

## 2015-01-31 DIAGNOSIS — M5412 Radiculopathy, cervical region: Secondary | ICD-10-CM

## 2015-01-31 DIAGNOSIS — M47816 Spondylosis without myelopathy or radiculopathy, lumbar region: Secondary | ICD-10-CM

## 2015-01-31 DIAGNOSIS — M7061 Trochanteric bursitis, right hip: Secondary | ICD-10-CM

## 2015-01-31 DIAGNOSIS — E7800 Pure hypercholesterolemia, unspecified: Secondary | ICD-10-CM | POA: Insufficient documentation

## 2015-01-31 DIAGNOSIS — Z7189 Other specified counseling: Secondary | ICD-10-CM

## 2015-01-31 DIAGNOSIS — G3184 Mild cognitive impairment, so stated: Secondary | ICD-10-CM | POA: Insufficient documentation

## 2015-01-31 DIAGNOSIS — M549 Dorsalgia, unspecified: Secondary | ICD-10-CM | POA: Diagnosis present

## 2015-01-31 DIAGNOSIS — M810 Age-related osteoporosis without current pathological fracture: Secondary | ICD-10-CM

## 2015-01-31 DIAGNOSIS — R52 Pain, unspecified: Secondary | ICD-10-CM

## 2015-01-31 DIAGNOSIS — Z5189 Encounter for other specified aftercare: Secondary | ICD-10-CM

## 2015-01-31 DIAGNOSIS — M79606 Pain in leg, unspecified: Secondary | ICD-10-CM

## 2015-01-31 MED ORDER — ONDANSETRON 8 MG PO TBDP: 8.0000 mg | ORAL_TABLET | Freq: Three times a day (TID) | ORAL | Status: DC | PRN

## 2015-01-31 MED ORDER — OXYCODONE HCL 5 MG PO TABS: 2.5000 mg | ORAL_TABLET | ORAL | Status: DC | PRN

## 2015-01-31 NOTE — Progress Notes (Signed)
Did not bring medication with her. Out of Oxycodone pills for about a month.

## 2015-01-31 NOTE — Patient Instructions (Signed)
GENERAL RISKS AND COMPLICATIONS  What are the risk, side effects and possible complications? Generally speaking, most procedures are safe.  However, with any procedure there are risks, side effects, and the possibility of complications.  The risks and complications are dependent upon the sites that are lesioned, or the type of nerve block to be performed.  The closer the procedure is to the spine, the more serious the risks are.  Great care is taken when placing the radio frequency needles, block needles or lesioning probes, but sometimes complications can occur. 1. Infection: Any time there is an injection through the skin, there is a risk of infection.  This is why sterile conditions are used for these blocks.  There are four possible types of infection. 1. Localized skin infection. 2. Central Nervous System Infection-This can be in the form of Meningitis, which can be deadly. 3. Epidural Infections-This can be in the form of an epidural abscess, which can cause pressure inside of the spine, causing compression of the spinal cord with subsequent paralysis. This would require an emergency surgery to decompress, and there are no guarantees that the patient would recover from the paralysis. 4. Discitis-This is an infection of the intervertebral discs.  It occurs in about 1% of discography procedures.  It is difficult to treat and it may lead to surgery.        2. Pain: the needles have to go through skin and soft tissues, will cause soreness.       3. Damage to internal structures:  The nerves to be lesioned may be near blood vessels or    other nerves which can be potentially damaged.       4. Bleeding: Bleeding is more common if the patient is taking blood thinners such as  aspirin, Coumadin, Ticiid, Plavix, etc., or if he/she have some genetic predisposition  such as hemophilia. Bleeding into the spinal canal can cause compression of the spinal  cord with subsequent paralysis.  This would require an  emergency surgery to  decompress and there are no guarantees that the patient would recover from the  paralysis.       5. Pneumothorax:  Puncturing of a lung is a possibility, every time a needle is introduced in  the area of the chest or upper back.  Pneumothorax refers to free air around the  collapsed lung(s), inside of the thoracic cavity (chest cavity).  Another two possible  complications related to a similar event would include: Hemothorax and Chylothorax.   These are variations of the Pneumothorax, where instead of air around the collapsed  lung(s), you may have blood or chyle, respectively.       6. Spinal headaches: They may occur with any procedures in the area of the spine.       7. Persistent CSF (Cerebro-Spinal Fluid) leakage: This is a rare problem, but may occur  with prolonged intrathecal or epidural catheters either due to the formation of a fistulous  track or a dural tear.       8. Nerve damage: By working so close to the spinal cord, there is always a possibility of  nerve damage, which could be as serious as a permanent spinal cord injury with  paralysis.       9. Death:  Although rare, severe deadly allergic reactions known as "Anaphylactic  reaction" can occur to any of the medications used.      10. Worsening of the symptoms:  We can always make thing worse.  What are the chances of something like this happening? Chances of any of this occuring are extremely low.  By statistics, you have more of a chance of getting killed in a motor vehicle accident: while driving to the hospital than any of the above occurring .  Nevertheless, you should be aware that they are possibilities.  In general, it is similar to taking a shower.  Everybody knows that you can slip, hit your head and get killed.  Does that mean that you should not shower again?  Nevertheless always keep in mind that statistics do not mean anything if you happen to be on the wrong side of them.  Even if a procedure has a 1  (one) in a 1,000,000 (million) chance of going wrong, it you happen to be that one..Also, keep in mind that by statistics, you have more of a chance of having something go wrong when taking medications.  Who should not have this procedure? If you are on a blood thinning medication (e.g. Coumadin, Plavix, see list of "Blood Thinners"), or if you have an active infection going on, you should not have the procedure.  If you are taking any blood thinners, please inform your physician.  How should I prepare for this procedure?  Do not eat or drink anything at least six hours prior to the procedure.  Bring a driver with you .  It cannot be a taxi.  Come accompanied by an adult that can drive you back, and that is strong enough to help you if your legs get weak or numb from the local anesthetic.  Take all of your medicines the morning of the procedure with just enough water to swallow them.  If you have diabetes, make sure that you are scheduled to have your procedure done first thing in the morning, whenever possible.  If you have diabetes, take only half of your insulin dose and notify our nurse that you have done so as soon as you arrive at the clinic.  If you are diabetic, but only take blood sugar pills (oral hypoglycemic), then do not take them on the morning of your procedure.  You may take them after you have had the procedure.  Do not take aspirin or any aspirin-containing medications, at least eleven (11) days prior to the procedure.  They may prolong bleeding.  Wear loose fitting clothing that may be easy to take off and that you would not mind if it got stained with Betadine or blood.  Do not wear any jewelry or perfume  Remove any nail coloring.  It will interfere with some of our monitoring equipment.  NOTE: Remember that this is not meant to be interpreted as a complete list of all possible complications.  Unforeseen problems may occur.  BLOOD THINNERS The following drugs  contain aspirin or other products, which can cause increased bleeding during surgery and should not be taken for 2 weeks prior to and 1 week after surgery.  If you should need take something for relief of minor pain, you may take acetaminophen which is found in Tylenol,m Datril, Anacin-3 and Panadol. It is not blood thinner. The products listed below are.  Do not take any of the products listed below in addition to any listed on your instruction sheet.  A.P.C or A.P.C with Codeine Codeine Phosphate Capsules #3 Ibuprofen Ridaura  ABC compound Congesprin Imuran rimadil  Advil Cope Indocin Robaxisal  Alka-Seltzer Effervescent Pain Reliever and Antacid Coricidin or Coricidin-D  Indomethacin Rufen  Alka-Seltzer plus Cold Medicine Cosprin Ketoprofen S-A-C Tablets  Anacin Analgesic Tablets or Capsules Coumadin Korlgesic Salflex  Anacin Extra Strength Analgesic tablets or capsules CP-2 Tablets Lanoril Salicylate  Anaprox Cuprimine Capsules Levenox Salocol  Anexsia-D Dalteparin Magan Salsalate  Anodynos Darvon compound Magnesium Salicylate Sine-off  Ansaid Dasin Capsules Magsal Sodium Salicylate  Anturane Depen Capsules Marnal Soma  APF Arthritis pain formula Dewitt's Pills Measurin Stanback  Argesic Dia-Gesic Meclofenamic Sulfinpyrazone  Arthritis Bayer Timed Release Aspirin Diclofenac Meclomen Sulindac  Arthritis pain formula Anacin Dicumarol Medipren Supac  Analgesic (Safety coated) Arthralgen Diffunasal Mefanamic Suprofen  Arthritis Strength Bufferin Dihydrocodeine Mepro Compound Suprol  Arthropan liquid Dopirydamole Methcarbomol with Aspirin Synalgos  ASA tablets/Enseals Disalcid Micrainin Tagament  Ascriptin Doan's Midol Talwin  Ascriptin A/D Dolene Mobidin Tanderil  Ascriptin Extra Strength Dolobid Moblgesic Ticlid  Ascriptin with Codeine Doloprin or Doloprin with Codeine Momentum Tolectin  Asperbuf Duoprin Mono-gesic Trendar  Aspergum Duradyne Motrin or Motrin IB Triminicin  Aspirin  plain, buffered or enteric coated Durasal Myochrisine Trigesic  Aspirin Suppositories Easprin Nalfon Trillsate  Aspirin with Codeine Ecotrin Regular or Extra Strength Naprosyn Uracel  Atromid-S Efficin Naproxen Ursinus  Auranofin Capsules Elmiron Neocylate Vanquish  Axotal Emagrin Norgesic Verin  Azathioprine Empirin or Empirin with Codeine Normiflo Vitamin E  Azolid Emprazil Nuprin Voltaren  Bayer Aspirin plain, buffered or children's or timed BC Tablets or powders Encaprin Orgaran Warfarin Sodium  Buff-a-Comp Enoxaparin Orudis Zorpin  Buff-a-Comp with Codeine Equegesic Os-Cal-Gesic   Buffaprin Excedrin plain, buffered or Extra Strength Oxalid   Bufferin Arthritis Strength Feldene Oxphenbutazone   Bufferin plain or Extra Strength Feldene Capsules Oxycodone with Aspirin   Bufferin with Codeine Fenoprofen Fenoprofen Pabalate or Pabalate-SF   Buffets II Flogesic Panagesic   Buffinol plain or Extra Strength Florinal or Florinal with Codeine Panwarfarin   Buf-Tabs Flurbiprofen Penicillamine   Butalbital Compound Four-way cold tablets Penicillin   Butazolidin Fragmin Pepto-Bismol   Carbenicillin Geminisyn Percodan   Carna Arthritis Reliever Geopen Persantine   Carprofen Gold's salt Persistin   Chloramphenicol Goody's Phenylbutazone   Chloromycetin Haltrain Piroxlcam   Clmetidine heparin Plaquenil   Cllnoril Hyco-pap Ponstel   Clofibrate Hydroxy chloroquine Propoxyphen         Before stopping any of these medications, be sure to consult the physician who ordered them.  Some, such as Coumadin (Warfarin) are ordered to prevent or treat serious conditions such as "deep thrombosis", "pumonary embolisms", and other heart problems.  The amount of time that you may need off of the medication may also vary with the medication and the reason for which you were taking it.  If you are taking any of these medications, please make sure you notify your pain physician before you undergo any  procedures.         Facet Blocks Patient Information  Description: The facets are joints in the spine between the vertebrae.  Like any joints in the body, facets can become irritated and painful.  Arthritis can also effect the facets.  By injecting steroids and local anesthetic in and around these joints, we can temporarily block the nerve supply to them.  Steroids act directly on irritated nerves and tissues to reduce selling and inflammation which often leads to decreased pain.  Facet blocks may be done anywhere along the spine from the neck to the low back depending upon the location of your pain.   After numbing the skin with local anesthetic (like Novocaine), a small needle is passed onto the facet joints under x-ray guidance.  You may experience a sensation of pressure while this is being done.  The entire block usually lasts about 15-25 minutes.   Conditions which may be treated by facet blocks:   Low back/buttock pain  Neck/shoulder pain  Certain types of headaches  Preparation for the injection:  1. Do not eat any solid food or dairy products within 6 hours of your appointment. 2. You may drink clear liquid up to 2 hours before appointment.  Clear liquids include water, black coffee, juice or soda.  No milk or cream please. 3. You may take your regular medication, including pain medications, with a sip of water before your appointment.  Diabetics should hold regular insulin (if taken separately) and take 1/2 normal NPH dose the morning of the procedure.  Carry some sugar containing items with you to your appointment. 4. A driver must accompany you and be prepared to drive you home after your procedure. 5. Bring all your current medications with you. 6. An IV may be inserted and sedation may be given at the discretion of the physician. 7. A blood pressure cuff, EKG and other monitors will often be applied during the procedure.  Some patients may need to have extra oxygen  administered for a short period. 8. You will be asked to provide medical information, including your allergies and medications, prior to the procedure.  We must know immediately if you are taking blood thinners (like Coumadin/Warfarin) or if you are allergic to IV iodine contrast (dye).  We must know if you could possible be pregnant.  Possible side-effects:   Bleeding from needle site  Infection (rare, may require surgery)  Nerve injury (rare)  Numbness & tingling (temporary)  Difficulty urinating (rare, temporary)  Spinal headache (a headache worse with upright posture)  Light-headedness (temporary)  Pain at injection site (serveral days)  Decreased blood pressure (rare, temporary)  Weakness in arm/leg (temporary)  Pressure sensation in back/neck (temporary)   Call if you experience:   Fever/chills associated with headache or increased back/neck pain  Headache worsened by an upright position  New onset, weakness or numbness of an extremity below the injection site  Hives or difficulty breathing (go to the emergency room)  Inflammation or drainage at the injection site(s)  Severe back/neck pain greater than usual  New symptoms which are concerning to you  Please note:  Although the local anesthetic injected can often make your back or neck feel good for several hours after the injection, the pain will likely return. It takes 3-7 days for steroids to work.  You may not notice any pain relief for at least one week.  If effective, we will often do a series of 2-3 injections spaced 3-6 weeks apart to maximally decrease your pain.  After the initial series, you may be a candidate for a more permanent nerve block of the facets.  If you have any questions, please call #[phone]) [facility]

## 2015-01-31 NOTE — Progress Notes (Signed)
Patient's Name: Stephanie Short MRN: [id number] DOB: 1950-05-13 DOS: 01/31/2015  Primary Reason(s) for Visit: Encounter for Medication Management CC: Hip Pain and Back Pain   HPI:    Stephanie Short is a 65 y.o. year old, female patient, who returns today as an established patient. She has HYPERLIPIDEMIA-MIXED; AORTIC ATHEROSCLEROSIS; Endometrial polyp; Chronic pain; Chronic low back pain (Location of Primary Source of Pain) (Bilateral) (R>L); Lumbar spondylosis; Failed back surgical syndrome; Lumbar facet syndrome (Bilateral) (R>L); Long term current use of opiate analgesic; Long term prescription opiate use; Opiate use; Encounter for therapeutic drug level monitoring; Encounter for chronic pain management; Airway hyperreactivity; Atherosclerosis of abdominal aorta ([place]); Cervical nerve root disorder; Diabetes mellitus ([place]); Benign essential HTN; Cannot sleep; Major depression in remission ([place]); Burning or prickling sensation; Hemorrhage, postmenopausal; Pure hypercholesterolemia; Osteopenia; Chronic hip pain (Bilateral) (L>R); Greater trochanteric bursitis of hips (Bilateral); Chronic sacroiliac joint pain (Bilateral); Osteoarthritis of hips (Bilateral); Opioid dependence, uncomplicated ([place]); Pain management; Chronic pain of lower extremity  (Bilateral) (L>R); Postmenopausal osteoporosis; Chronic cervical radicular pain; Cancer associated pain; Neurogenic pain; and Chronic kidney disease on her problem list.. Her primarily concern today is the Hip Pain and Back Pain     The patient returns to the clinic today for pharmacological management of her chronic pain. Physical examination reveals the presence of bilateral lumbar facet syndrome, bilateral greater trochanteric bursitis, and bilateral sacroiliac joint pain. The Patient indicates that her primary pain is that of low back, bilaterally, with the right side being worse than the left. The patient has a history of a failed back surgery syndrome.  However, having said that, she indicates that prior to surgery she was having constant leg pain which went away after the surgery by Dr. Raliegh Ip list. She continued to have low back pain and pain going down to both of her hips with the left being worst on the right. She has been seen by several pain specialists and based on the available notes, the patient has had diagnostic local lumbar facet blocks done by Dr. Primus Bravo.  The patient is currently taking oxycodone 5 mg by mouth 3 times a day and she is requesting to have this dose increase. Evaluating the medication, she indicates that the onset of her analgesia is 10 minutes, the peak effect comes after 1.5 hours, and then last for hours. This is the typical pharmacology used this medication however, she indicates that every time that she takes the medication she has to take Zofran and she goes to sleep. Today we went over this and I explained to her that the goal of pain management is not to become nonfunctional, but to increase the level of functionality. To that goal, and we have that her current dose I believe it to be too high. So this problem, I have recommended that she start taking the oxycodone 5 mg (half of a tablet) up to 6 times a day. This will provide her with the same amount of pain medicine but it should not peak causing her to have problems with nausea or sleepiness. The patient admits that when she started taking the medication she would get cognitive impairment where she would become dizzy and somewhat disoriented.  Today's Pain Score: 8 , clinically she looks like a 2/10. Reported level of pain is incompatible with clinical obrservations. This may be secondary to a possible lack of understanding on how the pain scale works. Pain Type: Chronic pain Pain Location: Back Pain Orientation: Lower Pain Descriptors / Indicators: Spasm Pain  Frequency: Constant  Date of Last Visit: 08/30/14 (CPS) Service Provided on Last Visit: Med  Refill  Pharmacotherapy Review:   Side-effects or Adverse reactions: At the peak of the medication, she gets sleepy and nauseous. With time she has gotten better but at the very beginning she would get cognitive impairment where she would become dizzy and disoriented. Effectiveness: Described as relatively effective, allowing for increase in activities of daily living (ADL). At the peak of the medication she indicates that it would take away 100% of the pain. Onset of action: Within expected pharmacological parameters. Approximately 10 minutes. Duration of action: Within normal limits for medication. Approximately 4 hours. Peak effect: Timing and results are as within normal expected parameters. Approximately 1.5 hours. [place] PMP: Compliant with practice rules and regulations UDS Results: No UDS available, at this time UDS Interpretation: No UDS available, at this time Medication Assessment Form: Reviewed. Patient indicates being compliant with therapy Treatment compliance: Compliant Substance Use Disorder (SUD) Risk Level: Low Pharmacologic Plan: Dose was cut in half and then dosing. It was also short-term by half.  Lab Work: Illicit Drugs No results found for: THCU, COCAINSCRNUR, PCPSCRNUR, MDMA, AMPHETMU, METHADONE, ETOH  Inflammation Markers No results found for: ESRSEDRATE, CRP  Renal Function Lab Results  Component Value Date   BUN 20 12/23/2014   CREATININE 1.02* 12/23/2014   GFRAA >60 12/23/2014   GFRNONAA 56* 12/23/2014    Hepatic Function Lab Results  Component Value Date   AST 19 01/10/2013   ALT 21 01/10/2013   ALBUMIN 3.6 01/10/2013    Electrolytes Lab Results  Component Value Date   NA 138 12/23/2014   K 4.0 12/23/2014   CL 105 12/23/2014   CALCIUM 9.4 12/23/2014    Allergies:  Stephanie Short is allergic to codeine; dilaudid; percocet; tramadol; gabapentin; hydrocodone-acetaminophen; promethazine; and scallops.  Meds:  The patient has a current medication  list which includes the following prescription(s): acetaminophen, albuterol, alprazolam, ascorbic acid, aspirin ec, felodipine, metformin, ondansetron, oxycodone, ranitidine, spironolactone, trazodone, and zolpidem. Requested Prescriptions   Signed Prescriptions Disp Refills  . oxyCODONE (OXY IR/ROXICODONE) 5 MG immediate release tablet 90 tablet 0    Sig: Take 0.5 tablets (2.5 mg total) by mouth every 4 (four) hours as needed for moderate pain or severe pain.  Marland Kitchen ondansetron (ZOFRAN-ODT) 8 MG disintegrating tablet 30 tablet 0    Sig: Take 1 tablet (8 mg total) by mouth 3 (three) times daily as needed for nausea or vomiting.    ROS:  Constitutional: Afebrile, no chills, well hydrated and well nourished Gastrointestinal: negative Musculoskeletal:negative Neurological: negative Behavioral/Psych: negative  PFSH:  Medical:  Stephanie Short  has a past medical history of Hypertension; Diabetes mellitus; Hepatitis C; Asthma; Insomnia; and Depression. Family: family history includes Colon cancer in her other; Coronary artery disease (age of onset: 62) in her brother; Coronary artery disease (age of onset: 83) in her mother; Diabetes in her other; Heart attack in her brother; Hypertension in her other. Surgical:  has past surgical history that includes Back surgery (2013); Hysteroscopy w/D&C (N/A, 08/18/2014); Knee arthroscopy (Right); and Colonoscopy w/ polypectomy. Tobacco:  reports that she quit smoking about 11 years ago. She does not have any smokeless tobacco history on file. Alcohol:  reports that she drinks alcohol. Drug:  reports that she does not use illicit drugs.  Physical Exam:  Vitals:  Today's Vitals   01/31/15 0936  BP: 140/69  Pulse: 98  Temp: 98 F (36.7 C)  TempSrc: Oral  Resp:  18  Height: 5' 3.5" (1.613 m)  Weight: 184 lb (83.462 kg)  SpO2: 96%  PainSc: 8   PainLoc: Back  Calculated BMI: Body mass index is 32.08 kg/(m^2). General appearance: alert, cooperative, appears  stated age and no distress Eyes: PERLA Respiratory: No evidence respiratory distress, no audible rales or ronchi and no use of accessory muscles of respiration Neck: no adenopathy, no carotid bruit, no JVD, supple, symmetrical, trachea midline and thyroid not enlarged, symmetric, no tenderness/mass/nodules  Cervical Spine ROM: Decreased Palpation: No palpable trigger points  Upper Extremities ROM: Adequate bilaterally Strength: 5/5 for all flexors and extensors of the upper extremity, bilaterally Pulses: Palpable bilaterally Neurologic: No allodynia, No hyperesthesia, No hyperpathia and No sensory abnormalities  Lumbar Spine ROM: Decreased Palpation: No palpable trigger points Lumbar Hyperextension and rotation: Non-contributory Patrick's Maneuver: Non-contributory  Lower Extremities ROM: Adequate bilaterally Strength: 5/5 for all flexors and extensors of the lower extremity, bilaterally Pulses: Palpable bilaterally Neurologic: No allodynia, No hyperesthesia, No hyperpathia, No sensory abnormalities and No antalgic gait or posture  Assessment:  Encounter Diagnosis:  Primary Diagnosis: Chronic pain [G89.29]  Plan:   Interventional Therapies: Diagnostic, bilateral, lumbar facet block under fluoroscopic guidance and IV sedation.    Stephanie Short was seen today for hip pain and back pain.  Diagnoses and all orders for this visit:  Chronic pain -     Comprehensive metabolic panel -     C-reactive protein -     Magnesium -     Sedimentation rate -     Vitamin B12 -     oxyCODONE (OXY IR/ROXICODONE) 5 MG immediate release tablet; Take 0.5 tablets (2.5 mg total) by mouth every 4 (four) hours as needed for moderate pain or severe pain.  Chronic low back pain  Lumbar spondylosis, unspecified spinal osteoarthritis  Failed back surgical syndrome -     DG Lumbar Spine Complete W/Bend; Future  Lumbar facet syndrome (Bilateral) (R>L) -     LUMBAR FACET(MEDIAL BRANCH NERVE BLOCK)  MBNB; Future  Long term current use of opiate analgesic -     Drugs of abuse screen w/o alc, rtn urine-sln  Long term prescription opiate use  Opiate use -     ondansetron (ZOFRAN-ODT) 8 MG disintegrating tablet; Take 1 tablet (8 mg total) by mouth 3 (three) times daily as needed for nausea or vomiting.  Encounter for therapeutic drug level monitoring  Encounter for chronic pain management  Osteopenia  Chronic hip pain, unspecified laterality  Greater trochanteric bursitis of hips (Bilateral)  Chronic sacroiliac joint pain (Bilateral)  Primary osteoarthritis of both hips  Opioid dependence, uncomplicated ([facility])  Pain management  Chronic pain of lower extremity, unspecified laterality  Postmenopausal osteoporosis  Chronic cervical radicular pain  Cancer associated pain  Neurogenic pain  Chronic kidney disease, stage 3 (moderate)     Patient Instructions   GENERAL RISKS AND COMPLICATIONS  What are the risk, side effects and possible complications? Generally speaking, most procedures are safe.  However, with any procedure there are risks, side effects, and the possibility of complications.  The risks and complications are dependent upon the sites that are lesioned, or the type of nerve block to be performed.  The closer the procedure is to the spine, the more serious the risks are.  Great care is taken when placing the radio frequency needles, block needles or lesioning probes, but sometimes complications can occur. 1. Infection: Any time there is an injection through the skin, there is a risk of infection.  This is why sterile conditions are used for these blocks.  There are four possible types of infection. 1. Localized skin infection. 2. Central Nervous System Infection-This can be in the form of Meningitis, which can be deadly. 3. Epidural Infections-This can be in the form of an epidural abscess, which can cause pressure inside of the spine, causing compression of  the spinal cord with subsequent paralysis. This would require an emergency surgery to decompress, and there are no guarantees that the patient would recover from the paralysis. 4. Discitis-This is an infection of the intervertebral discs.  It occurs in about 1% of discography procedures.  It is difficult to treat and it may lead to surgery.        2. Pain: the needles have to go through skin and soft tissues, will cause soreness.       3. Damage to internal structures:  The nerves to be lesioned may be near blood vessels or    other nerves which can be potentially damaged.       4. Bleeding: Bleeding is more common if the patient is taking blood thinners such as  aspirin, Coumadin, Ticiid, Plavix, etc., or if he/she have some genetic predisposition  such as hemophilia. Bleeding into the spinal canal can cause compression of the spinal  cord with subsequent paralysis.  This would require an emergency surgery to  decompress and there are no guarantees that the patient would recover from the  paralysis.       5. Pneumothorax:  Puncturing of a lung is a possibility, every time a needle is introduced in  the area of the chest or upper back.  Pneumothorax refers to free air around the  collapsed lung(s), inside of the thoracic cavity (chest cavity).  Another two possible  complications related to a similar event would include: Hemothorax and Chylothorax.   These are variations of the Pneumothorax, where instead of air around the collapsed  lung(s), you may have blood or chyle, respectively.       6. Spinal headaches: They may occur with any procedures in the area of the spine.       7. Persistent CSF (Cerebro-Spinal Fluid) leakage: This is a rare problem, but may occur  with prolonged intrathecal or epidural catheters either due to the formation of a fistulous  track or a dural tear.       8. Nerve damage: By working so close to the spinal cord, there is always a possibility of  nerve damage, which could be as  serious as a permanent spinal cord injury with  paralysis.       9. Death:  Although rare, severe deadly allergic reactions known as "Anaphylactic  reaction" can occur to any of the medications used.      10. Worsening of the symptoms:  We can always make thing worse.  What are the chances of something like this happening? Chances of any of this occuring are extremely low.  By statistics, you have more of a chance of getting killed in a motor vehicle accident: while driving to the hospital than any of the above occurring .  Nevertheless, you should be aware that they are possibilities.  In general, it is similar to taking a shower.  Everybody knows that you can slip, hit your head and get killed.  Does that mean that you should not shower again?  Nevertheless always keep in mind that statistics do not mean anything if you happen to be on the  wrong side of them.  Even if a procedure has a 1 (one) in a 1,000,000 (million) chance of going wrong, it you happen to be that one..Also, keep in mind that by statistics, you have more of a chance of having something go wrong when taking medications.  Who should not have this procedure? If you are on a blood thinning medication (e.g. Coumadin, Plavix, see list of "Blood Thinners"), or if you have an active infection going on, you should not have the procedure.  If you are taking any blood thinners, please inform your physician.  How should I prepare for this procedure?  Do not eat or drink anything at least six hours prior to the procedure.  Bring a driver with you .  It cannot be a taxi.  Come accompanied by an adult that can drive you back, and that is strong enough to help you if your legs get weak or numb from the local anesthetic.  Take all of your medicines the morning of the procedure with just enough water to swallow them.  If you have diabetes, make sure that you are scheduled to have your procedure done first thing in the morning, whenever  possible.  If you have diabetes, take only half of your insulin dose and notify our nurse that you have done so as soon as you arrive at the clinic.  If you are diabetic, but only take blood sugar pills (oral hypoglycemic), then do not take them on the morning of your procedure.  You may take them after you have had the procedure.  Do not take aspirin or any aspirin-containing medications, at least eleven (11) days prior to the procedure.  They may prolong bleeding.  Wear loose fitting clothing that may be easy to take off and that you would not mind if it got stained with Betadine or blood.  Do not wear any jewelry or perfume  Remove any nail coloring.  It will interfere with some of our monitoring equipment.  NOTE: Remember that this is not meant to be interpreted as a complete list of all possible complications.  Unforeseen problems may occur.  BLOOD THINNERS The following drugs contain aspirin or other products, which can cause increased bleeding during surgery and should not be taken for 2 weeks prior to and 1 week after surgery.  If you should need take something for relief of minor pain, you may take acetaminophen which is found in Tylenol,m Datril, Anacin-3 and Panadol. It is not blood thinner. The products listed below are.  Do not take any of the products listed below in addition to any listed on your instruction sheet.  A.P.C or A.P.C with Codeine Codeine Phosphate Capsules #3 Ibuprofen Ridaura  ABC compound Congesprin Imuran rimadil  Advil Cope Indocin Robaxisal  Alka-Seltzer Effervescent Pain Reliever and Antacid Coricidin or Coricidin-D  Indomethacin Rufen  Alka-Seltzer plus Cold Medicine Cosprin Ketoprofen S-A-C Tablets  Anacin Analgesic Tablets or Capsules Coumadin Korlgesic Salflex  Anacin Extra Strength Analgesic tablets or capsules CP-2 Tablets Lanoril Salicylate  Anaprox Cuprimine Capsules Levenox Salocol  Anexsia-D Dalteparin Magan Salsalate  Anodynos Darvon compound  Magnesium Salicylate Sine-off  Ansaid Dasin Capsules Magsal Sodium Salicylate  Anturane Depen Capsules Marnal Soma  APF Arthritis pain formula Dewitt's Pills Measurin Stanback  Argesic Dia-Gesic Meclofenamic Sulfinpyrazone  Arthritis Bayer Timed Release Aspirin Diclofenac Meclomen Sulindac  Arthritis pain formula Anacin Dicumarol Medipren Supac  Analgesic (Safety coated) Arthralgen Diffunasal Mefanamic Suprofen  Arthritis Strength Bufferin Dihydrocodeine Mepro Compound Suprol  Arthropan  liquid Dopirydamole Methcarbomol with Aspirin Synalgos  ASA tablets/Enseals Disalcid Micrainin Tagament  Ascriptin Doan's Midol Talwin  Ascriptin A/D Dolene Mobidin Tanderil  Ascriptin Extra Strength Dolobid Moblgesic Ticlid  Ascriptin with Codeine Doloprin or Doloprin with Codeine Momentum Tolectin  Asperbuf Duoprin Mono-gesic Trendar  Aspergum Duradyne Motrin or Motrin IB Triminicin  Aspirin plain, buffered or enteric coated Durasal Myochrisine Trigesic  Aspirin Suppositories Easprin Nalfon Trillsate  Aspirin with Codeine Ecotrin Regular or Extra Strength Naprosyn Uracel  Atromid-S Efficin Naproxen Ursinus  Auranofin Capsules Elmiron Neocylate Vanquish  Axotal Emagrin Norgesic Verin  Azathioprine Empirin or Empirin with Codeine Normiflo Vitamin E  Azolid Emprazil Nuprin Voltaren  Bayer Aspirin plain, buffered or children's or timed BC Tablets or powders Encaprin Orgaran Warfarin Sodium  Buff-a-Comp Enoxaparin Orudis Zorpin  Buff-a-Comp with Codeine Equegesic Os-Cal-Gesic   Buffaprin Excedrin plain, buffered or Extra Strength Oxalid   Bufferin Arthritis Strength Feldene Oxphenbutazone   Bufferin plain or Extra Strength Feldene Capsules Oxycodone with Aspirin   Bufferin with Codeine Fenoprofen Fenoprofen Pabalate or Pabalate-SF   Buffets II Flogesic Panagesic   Buffinol plain or Extra Strength Florinal or Florinal with Codeine Panwarfarin   Buf-Tabs Flurbiprofen Penicillamine   Butalbital Compound  Four-way cold tablets Penicillin   Butazolidin Fragmin Pepto-Bismol   Carbenicillin Geminisyn Percodan   Carna Arthritis Reliever Geopen Persantine   Carprofen Gold's salt Persistin   Chloramphenicol Goody's Phenylbutazone   Chloromycetin Haltrain Piroxlcam   Clmetidine heparin Plaquenil   Cllnoril Hyco-pap Ponstel   Clofibrate Hydroxy chloroquine Propoxyphen         Before stopping any of these medications, be sure to consult the physician who ordered them.  Some, such as Coumadin (Warfarin) are ordered to prevent or treat serious conditions such as "deep thrombosis", "pumonary embolisms", and other heart problems.  The amount of time that you may need off of the medication may also vary with the medication and the reason for which you were taking it.  If you are taking any of these medications, please make sure you notify your pain physician before you undergo any procedures.         Facet Blocks Patient Information  Description: The facets are joints in the spine between the vertebrae.  Like any joints in the body, facets can become irritated and painful.  Arthritis can also effect the facets.  By injecting steroids and local anesthetic in and around these joints, we can temporarily block the nerve supply to them.  Steroids act directly on irritated nerves and tissues to reduce selling and inflammation which often leads to decreased pain.  Facet blocks may be done anywhere along the spine from the neck to the low back depending upon the location of your pain.   After numbing the skin with local anesthetic (like Novocaine), a small needle is passed onto the facet joints under x-ray guidance.  You may experience a sensation of pressure while this is being done.  The entire block usually lasts about 15-25 minutes.   Conditions which may be treated by facet blocks:   Low back/buttock pain  Neck/shoulder pain  Certain types of headaches  Preparation for the injection:  1. Do not  eat any solid food or dairy products within 6 hours of your appointment. 2. You may drink clear liquid up to 2 hours before appointment.  Clear liquids include water, black coffee, juice or soda.  No milk or cream please. 3. You may take your regular medication, including pain medications, with a sip of  water before your appointment.  Diabetics should hold regular insulin (if taken separately) and take 1/2 normal NPH dose the morning of the procedure.  Carry some sugar containing items with you to your appointment. 4. A driver must accompany you and be prepared to drive you home after your procedure. 5. Bring all your current medications with you. 6. An IV may be inserted and sedation may be given at the discretion of the physician. 7. A blood pressure cuff, EKG and other monitors will often be applied during the procedure.  Some patients may need to have extra oxygen administered for a short period. 8. You will be asked to provide medical information, including your allergies and medications, prior to the procedure.  We must know immediately if you are taking blood thinners (like Coumadin/Warfarin) or if you are allergic to IV iodine contrast (dye).  We must know if you could possible be pregnant.  Possible side-effects:   Bleeding from needle site  Infection (rare, may require surgery)  Nerve injury (rare)  Numbness & tingling (temporary)  Difficulty urinating (rare, temporary)  Spinal headache (a headache worse with upright posture)  Light-headedness (temporary)  Pain at injection site (serveral days)  Decreased blood pressure (rare, temporary)  Weakness in arm/leg (temporary)  Pressure sensation in back/neck (temporary)   Call if you experience:   Fever/chills associated with headache or increased back/neck pain  Headache worsened by an upright position  New onset, weakness or numbness of an extremity below the injection site  Hives or difficulty breathing (go to the  emergency room)  Inflammation or drainage at the injection site(s)  Severe back/neck pain greater than usual  New symptoms which are concerning to you  Please note:  Although the local anesthetic injected can often make your back or neck feel good for several hours after the injection, the pain will likely return. It takes 3-7 days for steroids to work.  You may not notice any pain relief for at least one week.  If effective, we will often do a series of 2-3 injections spaced 3-6 weeks apart to maximally decrease your pain.  After the initial series, you may be a candidate for a more permanent nerve block of the facets.  If you have any questions, please call #[phone]) [facility]   Medications discontinued today:  Medications Discontinued During This Encounter  Medication Reason  . ranitidine (ZANTAC) 150 MG capsule Error  . oxyCODONE (OXY IR/ROXICODONE) 5 MG immediate release tablet Reorder  . ondansetron (ZOFRAN-ODT) 8 MG disintegrating tablet Reorder  . pantoprazole (PROTONIX) 40 MG tablet Error   Medications administered today:  Ms. Sun had no medications administered during this visit.  Primary Care Physician: Kirk Ruths., MD Location: [facility] Facility Note by: Kathlen Brunswick. Dossie Arbour, M.D, DABA, DABAPM, DABPM, DABIPP, FIPP

## 2015-02-01 DIAGNOSIS — M810 Age-related osteoporosis without current pathological fracture: Secondary | ICD-10-CM | POA: Insufficient documentation

## 2015-02-01 DIAGNOSIS — M79606 Pain in leg, unspecified: Secondary | ICD-10-CM

## 2015-02-01 DIAGNOSIS — N189 Chronic kidney disease, unspecified: Secondary | ICD-10-CM | POA: Insufficient documentation

## 2015-02-01 DIAGNOSIS — M7062 Trochanteric bursitis, left hip: Secondary | ICD-10-CM

## 2015-02-01 DIAGNOSIS — M160 Bilateral primary osteoarthritis of hip: Secondary | ICD-10-CM | POA: Insufficient documentation

## 2015-02-01 DIAGNOSIS — R52 Pain, unspecified: Secondary | ICD-10-CM | POA: Insufficient documentation

## 2015-02-01 DIAGNOSIS — M7061 Trochanteric bursitis, right hip: Secondary | ICD-10-CM | POA: Insufficient documentation

## 2015-02-01 DIAGNOSIS — M533 Sacrococcygeal disorders, not elsewhere classified: Secondary | ICD-10-CM

## 2015-02-01 DIAGNOSIS — M792 Neuralgia and neuritis, unspecified: Secondary | ICD-10-CM | POA: Insufficient documentation

## 2015-02-01 DIAGNOSIS — M5412 Radiculopathy, cervical region: Secondary | ICD-10-CM

## 2015-02-01 DIAGNOSIS — G8929 Other chronic pain: Secondary | ICD-10-CM | POA: Insufficient documentation

## 2015-02-01 DIAGNOSIS — M25559 Pain in unspecified hip: Secondary | ICD-10-CM

## 2015-02-08 ENCOUNTER — Ambulatory Visit: Payer: Medicare Other | Admitting: Pain Medicine

## 2015-02-09 LAB — TOXASSURE SELECT 13 (MW), URINE: PDF: 0

## 2015-02-13 ENCOUNTER — Other Ambulatory Visit: Payer: Self-pay

## 2015-02-15 ENCOUNTER — Ambulatory Visit: Payer: 59 | Attending: Pain Medicine | Admitting: Pain Medicine

## 2015-02-15 ENCOUNTER — Encounter: Payer: Self-pay | Admitting: Pain Medicine

## 2015-02-15 VITALS — BP 139/80 | HR 88 | Temp 98.1°F | Resp 16 | Ht 63.0 in | Wt 182.0 lb

## 2015-02-15 DIAGNOSIS — M7061 Trochanteric bursitis, right hip: Secondary | ICD-10-CM | POA: Diagnosis not present

## 2015-02-15 DIAGNOSIS — E7800 Pure hypercholesterolemia, unspecified: Secondary | ICD-10-CM | POA: Insufficient documentation

## 2015-02-15 DIAGNOSIS — I129 Hypertensive chronic kidney disease with stage 1 through stage 4 chronic kidney disease, or unspecified chronic kidney disease: Secondary | ICD-10-CM | POA: Diagnosis not present

## 2015-02-15 DIAGNOSIS — M549 Dorsalgia, unspecified: Secondary | ICD-10-CM | POA: Diagnosis present

## 2015-02-15 DIAGNOSIS — I700 Atherosclerosis of aorta: Secondary | ICD-10-CM | POA: Diagnosis not present

## 2015-02-15 DIAGNOSIS — G893 Neoplasm related pain (acute) (chronic): Secondary | ICD-10-CM | POA: Insufficient documentation

## 2015-02-15 DIAGNOSIS — E782 Mixed hyperlipidemia: Secondary | ICD-10-CM | POA: Diagnosis not present

## 2015-02-15 DIAGNOSIS — E119 Type 2 diabetes mellitus without complications: Secondary | ICD-10-CM | POA: Insufficient documentation

## 2015-02-15 DIAGNOSIS — F1190 Opioid use, unspecified, uncomplicated: Secondary | ICD-10-CM | POA: Insufficient documentation

## 2015-02-15 DIAGNOSIS — M545 Low back pain: Secondary | ICD-10-CM | POA: Diagnosis not present

## 2015-02-15 DIAGNOSIS — G8929 Other chronic pain: Secondary | ICD-10-CM

## 2015-02-15 DIAGNOSIS — M47816 Spondylosis without myelopathy or radiculopathy, lumbar region: Secondary | ICD-10-CM | POA: Diagnosis not present

## 2015-02-15 DIAGNOSIS — M7062 Trochanteric bursitis, left hip: Secondary | ICD-10-CM | POA: Insufficient documentation

## 2015-02-15 DIAGNOSIS — F329 Major depressive disorder, single episode, unspecified: Secondary | ICD-10-CM | POA: Diagnosis not present

## 2015-02-15 MED ORDER — ROPIVACAINE HCL 2 MG/ML IJ SOLN
INTRAMUSCULAR | Status: AC
  Administered 2015-02-15: 9 mL
  Filled 2015-02-15: qty 20

## 2015-02-15 MED ORDER — MIDAZOLAM HCL 5 MG/5ML IJ SOLN
INTRAMUSCULAR | Status: AC
  Administered 2015-02-15: 2 mg via INTRAVENOUS
  Filled 2015-02-15: qty 5

## 2015-02-15 MED ORDER — FENTANYL CITRATE (PF) 100 MCG/2ML IJ SOLN
100.0000 ug | Freq: Once | INTRAMUSCULAR | Status: AC
  Administered 2015-02-15: 50 ug via INTRAVENOUS

## 2015-02-15 MED ORDER — ROPIVACAINE HCL 2 MG/ML IJ SOLN
9.0000 mL | Freq: Once | INTRAMUSCULAR | Status: AC
  Administered 2015-02-15: 9 mL

## 2015-02-15 MED ORDER — MIDAZOLAM HCL 5 MG/5ML IJ SOLN
5.0000 mg | Freq: Once | INTRAMUSCULAR | Status: AC
  Administered 2015-02-15: 2 mg via INTRAVENOUS

## 2015-02-15 MED ORDER — LACTATED RINGERS IV SOLN: 1000.0000 mL | INTRAVENOUS | Status: AC

## 2015-02-15 MED ORDER — TRIAMCINOLONE ACETONIDE 40 MG/ML IJ SUSP
40.0000 mg | Freq: Once | INTRAMUSCULAR | Status: AC
  Administered 2015-02-15: 40 mg

## 2015-02-15 MED ORDER — TRIAMCINOLONE ACETONIDE 40 MG/ML IJ SUSP
INTRAMUSCULAR | Status: AC
  Administered 2015-02-15: 40 mg
  Filled 2015-02-15: qty 2

## 2015-02-15 MED ORDER — FENTANYL CITRATE (PF) 100 MCG/2ML IJ SOLN
INTRAMUSCULAR | Status: AC
  Administered 2015-02-15: 50 ug via INTRAVENOUS
  Filled 2015-02-15: qty 2

## 2015-02-15 MED ORDER — LIDOCAINE HCL (PF) 1 % IJ SOLN: 10.0000 mL | Freq: Once | INTRAMUSCULAR | Status: DC

## 2015-02-15 NOTE — Progress Notes (Signed)
Safety precautions to be maintained throughout the outpatient stay will include: orient to surroundings, keep bed in low position, maintain call bell within reach at all times, provide assistance with transfer out of bed and ambulation.

## 2015-02-15 NOTE — Progress Notes (Signed)
Patient's Name: Stephanie Short MRN: [id number] DOB: 02/22/1950 DOS: 02/15/2015  Primary Reason(s) for Visit: Interventional Pain Management Treatment. CC: Back Pain   Pre-Procedure Assessment:  Ms. Games is a 65 y.o. year old, female patient, seen today for interventional treatment. She has HYPERLIPIDEMIA-MIXED; AORTIC ATHEROSCLEROSIS; Endometrial polyp; Chronic pain; Chronic low back pain (Location of Primary Source of Pain) (Bilateral) (R>L); Lumbar spondylosis; Failed back surgical syndrome; Lumbar facet syndrome (Bilateral) (R>L); Long term current use of opiate analgesic; Long term prescription opiate use; Opiate use; Encounter for therapeutic drug level monitoring; Encounter for chronic pain management; Airway hyperreactivity; Atherosclerosis of abdominal aorta ([place]); Cervical nerve root disorder; Diabetes mellitus ([place]); Benign essential HTN; Cannot sleep; Major depression in remission ([place]); Burning or prickling sensation; Hemorrhage, postmenopausal; Pure hypercholesterolemia; Osteopenia; Chronic hip pain (Bilateral) (L>R); Greater trochanteric bursitis of hips (Bilateral); Chronic sacroiliac joint pain (Bilateral); Osteoarthritis of hips (Bilateral); Opioid dependence, uncomplicated ([place]); Pain management; Chronic pain of lower extremity  (Bilateral) (L>R); Postmenopausal osteoporosis; Chronic cervical radicular pain; Cancer associated pain; Neurogenic pain; and Chronic kidney disease on her problem list.. Her primarily concern today is the Back Pain   Today's Initial Pain Score: 7/10 Reported level of pain is incompatible with clinical obrservations. This may be secondary to a possible lack of understanding on how the pain scale works. Pain Type: Chronic pain Pain Location: Back Pain Orientation:  (bilateral) Pain Descriptors / Indicators: Spasm, Sharp Pain Frequency: Constant  Post-procedure Pain Score: 0-No pain  Date of Last Visit: 01/31/15 Service Provided on Last Visit: Med  Refill  Verification of the correct person, correct site (including marking of site), and correct procedure were performed and confirmed by the patient.  Today's Vitals   02/15/15 1039 02/15/15 1044 02/15/15 1049 02/15/15 1053  BP: 139/84 141/91 138/82 139/80  Pulse: 89 89 88 88  Temp:      Resp: _0 Height:      Weight:      SpO2: 91% 94% 96% 97%  PainSc: 0-No pain 0-No pain 0-No pain 0-No pain  PainLoc:      Calculated BMI: Body mass index is 32.25 kg/(m^2). Allergies: She is allergic to codeine; dilaudid; percocet; tramadol; gabapentin; hydrocodone-acetaminophen; promethazine; and scallops.. 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 Lumbar facet syndrome (Bilateral) (R>L)   2. Lumbar spondylosis, unspecified spinal osteoarthritis   3. Chronic low back pain (Location of Primary Source of Pain) (Bilateral) (R>L)     In addition, [name] has Chronic pain; Chronic low back pain (Location of Primary Source of Pain) (Bilateral) (R>L); Lumbar spondylosis; Failed back surgical syndrome; Lumbar facet syndrome (Bilateral) (R>L); Cervical nerve root disorder; Chronic hip pain (Bilateral) (L>R); Greater trochanteric bursitis of hips (Bilateral); Chronic sacroiliac joint pain (Bilateral); Osteoarthritis of hips (Bilateral); Chronic pain of lower extremity  (Bilateral) (L>R); Chronic cervical radicular pain; Cancer associated pain; and Neurogenic pain on her pertinent problem list.  Consent: Secured. Under the influence of no sedatives a written informed consent was obtained, after having provided information on the risks and possible complications. To fulfill our ethical and legal obligations, as recommended by the American Medical Association's Code of Ethics, we have provided information to the patient about our  clinical impression; the nature and purpose of the treatment or procedure; the risks, benefits, and possible complications of the intervention; alternatives; the risk(s) and benefit(s)  of the alternative treatment(s) or procedure(s); and the risk(s) and benefit(s) of doing nothing. The patient was provided information about the risks and possible complications associated with the procedure. In the case of spinal procedures these may include, but are not limited to, failure to achieve desired goals, infection, bleeding, organ or nerve damage, allergic reactions, paralysis, and death. In addition, the patient was informed that Medicine is not an exact science; therefore, there is also the possibility of unforeseen risks and possible complications that may result in a catastrophic outcome. The patient indicated having understood very clearly. We have given the patient no guarantees and we have made no promises. Enough time was given to the patient to ask questions, all of which were answered to the patient's satisfaction.  Pre-Procedure Preparation: Safety Precautions: Allergies reviewed. Appropriate site, procedure, and patient were confirmed by following the Joint Commission's Universal Protocol (UP.01.01.01), in the form of a "Time Out". The patient was asked to confirm marked site and procedure, before commencing. The patient was asked about blood thinners, or active infections, both of which were denied. Patient was assessed for positional comfort and all pressure points were checked before starting procedure. Monitoring:  As per clinic protocol. Infection Control Precautions: Sterile technique used. Standard Universal Precautions were taken as recommended by the Department of [facility] [place] for Disease Control and Prevention (CDC). Standard pre-surgical skin prep was conducted. Respiratory hygiene and cough etiquette was practiced. Hand hygiene observed. Safe injection practices and needle disposal  techniques followed. SDV (single dose vial) medications used. Medications properly checked for expiration dates and contaminants. Personal protective equipment (PPE) used: Sterile double glove technique. Radiation resistant gloves. Sterile surgical gloves.  Anesthesia, Analgesia, Anxiolysis: Type: Moderate (Conscious) Sedation & Local Anesthesia Local Anesthetic: Lidocaine 1% Route: Intravenous (IV) IV Access: Secured Sedation: Meaningful verbal contact was maintained at all times during the procedure  Indication(s): Analgesia    Description of Procedure Process:  Time-out: "Time-out" completed before starting procedure, as per protocol. Position: Prone Target Area: For Lumbar Facet blocks, the target is the groove formed by the junction of the transverse process and superior articular process. For the L5 dorsal ramus, the target is the notch between superior articular process and sacral ala. For the S1 dorsal ramus, the target is the superior and lateral edge of the posterior S1 Sacral foramen. Approach: Paramedial approach. Area Prepped: Entire Posterior Lumbosacral Region Prepping solution: ChloraPrep (2% chlorhexidine gluconate and 70% isopropyl alcohol) Safety Precautions: Aspiration looking for blood return was conducted prior to all injections. At no point did we inject any substances, as a needle was being advanced. No attempts were made at seeking any paresthesias. Safe injection practices and needle disposal techniques used. Medications properly checked for expiration dates. SDV (single dose vial) medications used.   Description of the Procedure: Protocol guidelines were followed. The patient was placed in position over the fluoroscopy table. The target area was identified and the area prepped in the usual manner. Skin desensitized using vapocoolant spray. Skin & deeper tissues infiltrated with local anesthetic. Appropriate amount of time allowed to pass for local anesthetics to take  effect. The procedure needle was introduced through the skin, ipsilateral to the reported pain, and advanced to the target area. Employing the "Medial Branch Technique", the needles were advanced to the angle made by the superior and medial portion of the transverse process, and the lateral and inferior portion of the superior articulating process of the targeted vertebral bodies. This area is known as "Burton's Eye"  or the "Eye of the [place] Dog". A procedure needle was introduced through the skin, and this time advanced to the angle made by the superior and medial border of the sacral ala, and the lateral border of the S1 vertebral body. This last needle was later repositioned at the superior and lateral border of the posterior S1 foramen. Negative aspiration confirmed. Solution injected in intermittent fashion, asking for systemic symptoms every 0.5cc of injectate. The needles were then removed and the area cleansed, making sure to leave some of the prepping solution back to take advantage of its long term bactericidal properties. EBL: None Materials & Medications Used:  Needle(s) Used: 22g - 3.5" Spinal Needle(s) Medications Administered today: We administered fentaNYL, midazolam, ropivacaine (PF) 2 mg/ml (0.2%), triamcinolone acetonide, ropivacaine (PF) 2 mg/ml (0.2%), triamcinolone acetonide, fentaNYL, and midazolam.Please see chart orders for dosing details.  Imaging Guidance:  Type of Imaging Technique: Fluoroscopy Guidance (Spinal) Indication(s): Assistance in needle guidance and placement for procedures requiring needle placement in or near specific anatomical locations not easily accessible without such assistance. Exposure Time: Please see nurses notes. Contrast: None required. Fluoroscopic Guidance: I was personally present in the fluoroscopy suite, where the patient was placed in position for the procedure, over the fluoroscopy-compatible table. Fluoroscopy was manipulated, using "Tunnel  Vision Technique", to obtain the best possible view of the target area, on the affected side. Parallax error was corrected before commencing the procedure. A "direction-depth-direction" technique was used to introduce the needle under continuous pulsed fluoroscopic guidance. Once the target was reached, antero-posterior, oblique, and lateral fluoroscopic projection views were taken to confirm needle placement in all planes. Permanently recorded images stored by scanning into EMR. Interpretation: Intraoperative imaging interpretation by performing Physician. Adequate needle placement confirmed. Adequate needle placement confirmed in AP, lateral, & Oblique Views. No contrast injected.  Antibiotics:  Type:  Antibiotics Given (last 72 hours)    None      Indication(s): No indications identified.  Post-operative Assessment:  Complications: No immediate post-treatment complications were observed. Disposition: Return to clinic for follow-up evaluation. The patient tolerated the entire procedure well. A repeat set of vitals were taken after the procedure and the patient was kept under observation following institutional policy, for this procedure. Post-procedural neurological assessment was performed, showing return to baseline, prior to discharge. The patient was discharged home, once institutional criteria were met. The patient was provided with post-procedure discharge instructions, including a section on how to identify potential problems. Should any problems arise concerning this procedure, the patient was given instructions to immediately contact us, at any time, without hesitation. In any case, we plan to contact the patient by telephone for a follow-up status report regarding this interventional procedure. Comments:  No additional relevant information.  Primary Care Physician: Kirk Ruths., MD Location: [facility] Outpatient Pain Management Facility Note by: Kathlen Brunswick. Dossie Arbour, M.D, DABA,  DABAPM, DABPM, DABIPP, FIPP   Illustration of the posterior view of the lumbar spine and the posterior neural structures. Laminae of L2 through S1 are labeled. DPRL5, dorsal primary ramus of L5; DPRS1, dorsal primary ramus of S1; DPR3, dorsal primary ramus of L3; FJ, facet (zygapophyseal) joint L3-L4; I, inferior articular process of L4; LB1, lateral branch of dorsal primary ramus of L1; IAB, inferior articular branches from L3 medial branch (supplies L4-L5 facet joint); IBP, intermediate branch plexus; MB3, medial branch of dorsal primary ramus of L3; NR3, third lumbar nerve root; S, superior articular process of L5; SAB, superior articular branches from L4 (supplies L4-5 facet  joint also); TP3, transverse process of L3.  Disclaimer:  Medicine is not an Chief Strategy Officer. The only guarantee in medicine is that nothing is guaranteed. It is important to note that the decision to proceed with this intervention was based on the information collected from the patient. The Data and conclusions were drawn from the patient's questionnaire, the interview, and the physical examination. Because the information was provided in large part by the patient, it cannot be guaranteed that it has not been purposely or unconsciously manipulated. Every effort has been made to obtain as much relevant data as possible for this evaluation. It is important to note that the conclusions that lead to this procedure are derived in large part from the available data. Always take into account that the treatment will also be dependent on availability of resources and existing treatment guidelines, considered by other Pain Management Practitioners as being common knowledge and practice, at the time of the intervention. For Medico-Legal purposes, it is also important to point out that variation in procedural techniques and pharmacological choices are the acceptable norm. The indications, contraindications, technique, and results of the above  procedure should only be interpreted and judged by a Board-Certified Interventional Pain Specialist with extensive familiarity and expertise in the same exact procedure and technique. Attempts at providing opinions without similar or greater experience and expertise than that of the treating physician will be considered as inappropriate and unethical, and shall result in a formal complaint to the state medical board and applicable specialty societies.

## 2015-02-15 NOTE — Patient Instructions (Signed)
Facet Joint Block The facet joints connect the bones of the spine (vertebrae). They make it possible for you to bend, twist, and make other movements with your spine. They also prevent you from overbending, overtwisting, and making other excessive movements.  A facet joint block is a procedure where a numbing medicine (anesthetic) is injected into a facet joint. Often, a type of anti-inflammatory medicine called a steroid is also injected. A facet joint block may be done for two reasons:   Diagnosis. A facet joint block may be done as a test to see whether neck or back pain is caused by a worn-down or infected facet joint. If the pain gets better after a facet joint block, it means the pain is probably coming from the facet joint. If the pain does not get better, it means the pain is probably not coming from the facet joint.   Therapy. A facet joint block may be done to relieve neck or back pain caused by a facet joint. A facet joint block is only done as a therapy if the pain does not improve with medicine, exercise programs, physical therapy, and other forms of pain management. LET [facility] CARE PROVIDER KNOW ABOUT:   Any allergies you have.   All medicines you are taking, including vitamins, herbs, eyedrops, and over-the-counter medicines and creams.   Previous problems you or members of your family have had with the use of anesthetics.   Any blood disorders you have had.   Other health problems you have. RISKS AND COMPLICATIONS Generally, having a facet joint block is safe. However, as with any procedure, complications can occur. Possible complications associated with having a facet joint block include:   Bleeding.   Injury to a nerve near the injection site.   Pain at the injection site.   Weakness or numbness in areas controlled by nerves near the injection site.   Infection.   Temporary fluid retention.   Allergic reaction to anesthetics or medicines used during  the procedure. BEFORE THE PROCEDURE   Follow your health care provider's instructions if you are taking dietary supplements or medicines. You may need to stop taking them or reduce your dosage.   Do not take any new dietary supplements or medicines without asking your health care provider first.   Follow your health care provider's instructions about eating and drinking before the procedure. You may need to stop eating and drinking several hours before the procedure.   Arrange to have an adult drive you home after the procedure. PROCEDURE  You may need to remove your clothing and dress in an open-back gown so that your health care provider can access your spine.   The procedure will be done while you are lying on an X-ray table. Most of the time you will be asked to lie on your stomach, but you may be asked to lie in a different position if an injection will be made in your neck.   Special machines will be used to monitor your oxygen levels, heart rate, and blood pressure.   If an injection will be made in your neck, an intravenous (IV) tube will be inserted into one of your veins. Fluids and medicine will flow directly into your body through the IV tube.   The area over the facet joint where the injection will be made will be cleaned with an antiseptic soap. The surrounding skin will be covered with sterile drapes.   An anesthetic will be applied to your skin  to make the injection area numb. You may feel a temporary stinging or burning sensation.   A video X-ray machine will be used to locate the joint. A contrast dye may be injected into the facet joint area to help with locating the joint.   When the joint is located, an anesthetic medicine will be injected into the joint through the needle.   Your health care provider will ask you whether you feel pain relief. If you do feel relief, a steroid may be injected to provide pain relief for a longer period of time. If you do not  feel relief or feel only partial relief, additional injections of an anesthetic may be made in other facet joints.   The needle will be removed, the skin will be cleansed, and bandages will be applied.  AFTER THE PROCEDURE   You will be observed for 15-30 minutes before being allowed to go home. Do not drive. Have an adult drive you or take a taxi or public transportation instead.   If you feel pain relief, the pain will return in several hours or days when the anesthetic wears off.   You may feel pain relief 2-14 days after the procedure. The amount of time this relief lasts varies from person to person.   It is normal to feel some tenderness over the injected area(s) for 2 days following the procedure.   If you have diabetes, you may have a temporary increase in blood sugar.   This information is not intended to replace advice given to you by your health care provider. Make sure you discuss any questions you have with your health care provider.   Document Released: 05/28/2006 Document Revised: 01/27/2014 Document Reviewed: 10/27/2011 Elsevier Interactive Patient Education 2016 [employer] Pain Management Discharge Instructions  General Discharge Instructions :  If you need to reach your doctor call: Monday-Friday 8:00 am - 4:00 pm at [phone] or toll free [phone].  After clinic hours [phone] to have operator reach doctor.  Bring all of your medication bottles to all your appointments in the pain clinic.  To cancel or reschedule your appointment with Pain Management please remember to call 24 hours in advance to avoid a fee.  Refer to the educational materials which you have been given on: General Risks, I had my Procedure. Discharge Instructions, Post Sedation.  Post Procedure Instructions:  The drugs you were given will stay in your system until tomorrow, so for the next 24 hours you should not drive, make any legal decisions or drink any alcoholic  beverages.  You may eat anything you prefer, but it is better to start with liquids then soups and crackers, and gradually work up to solid foods.  Please notify your doctor immediately if you have any unusual bleeding, trouble breathing or pain that is not related to your normal pain.  Depending on the type of procedure that was done, some parts of your body may feel week and/or numb.  This usually clears up by tonight or the next day.  Walk with the use of an assistive device or accompanied by an adult for the 24 hours.  You may use ice on the affected area for the first 24 hours.  Put ice in a Ziploc bag and cover with a towel and place against area 15 minutes on 15 minutes off.  You may switch to heat after 24 hours.

## 2015-02-16 ENCOUNTER — Telehealth: Payer: Self-pay

## 2015-02-16 NOTE — Telephone Encounter (Signed)
No answer- Left message to call if needed

## 2015-02-21 ENCOUNTER — Other Ambulatory Visit: Payer: Self-pay | Admitting: Pain Medicine

## 2015-02-25 ENCOUNTER — Encounter: Payer: Self-pay | Admitting: Emergency Medicine

## 2015-02-25 ENCOUNTER — Emergency Department: Payer: 59

## 2015-02-25 ENCOUNTER — Emergency Department
Admission: EM | Admit: 2015-02-25 | Discharge: 2015-02-25 | Disposition: A | Discharge status: Home / Self Care | Payer: 59 | Attending: Emergency Medicine | Admitting: Emergency Medicine

## 2015-02-25 DIAGNOSIS — Z7952 Long term (current) use of systemic steroids: Secondary | ICD-10-CM | POA: Insufficient documentation

## 2015-02-25 DIAGNOSIS — M79652 Pain in left thigh: Secondary | ICD-10-CM | POA: Insufficient documentation

## 2015-02-25 DIAGNOSIS — J45901 Unspecified asthma with (acute) exacerbation: Secondary | ICD-10-CM | POA: Diagnosis not present

## 2015-02-25 DIAGNOSIS — I10 Essential (primary) hypertension: Secondary | ICD-10-CM | POA: Diagnosis not present

## 2015-02-25 DIAGNOSIS — Z79899 Other long term (current) drug therapy: Secondary | ICD-10-CM | POA: Insufficient documentation

## 2015-02-25 DIAGNOSIS — M2550 Pain in unspecified joint: Secondary | ICD-10-CM | POA: Diagnosis not present

## 2015-02-25 DIAGNOSIS — E119 Type 2 diabetes mellitus without complications: Secondary | ICD-10-CM | POA: Insufficient documentation

## 2015-02-25 DIAGNOSIS — J069 Acute upper respiratory infection, unspecified: Secondary | ICD-10-CM | POA: Diagnosis not present

## 2015-02-25 DIAGNOSIS — Z87891 Personal history of nicotine dependence: Secondary | ICD-10-CM | POA: Diagnosis not present

## 2015-02-25 DIAGNOSIS — R05 Cough: Secondary | ICD-10-CM | POA: Diagnosis present

## 2015-02-25 DIAGNOSIS — Z7982 Long term (current) use of aspirin: Secondary | ICD-10-CM | POA: Diagnosis not present

## 2015-02-25 DIAGNOSIS — Z7984 Long term (current) use of oral hypoglycemic drugs: Secondary | ICD-10-CM | POA: Insufficient documentation

## 2015-02-25 DIAGNOSIS — M545 Low back pain: Secondary | ICD-10-CM | POA: Insufficient documentation

## 2015-02-25 DIAGNOSIS — G8929 Other chronic pain: Secondary | ICD-10-CM | POA: Diagnosis not present

## 2015-02-25 MED ORDER — PREDNISONE 20 MG PO TABS: 40.0000 mg | ORAL_TABLET | Freq: Every day | ORAL | Status: AC

## 2015-02-25 MED ORDER — BENZONATATE 100 MG PO CAPS: 100.0000 mg | ORAL_CAPSULE | Freq: Three times a day (TID) | ORAL | Status: DC | PRN

## 2015-02-25 MED ORDER — SALINE SPRAY 0.65 % NA SOLN: 1.0000 | NASAL | Status: DC | PRN

## 2015-02-25 NOTE — ED Notes (Addendum)
Pt states having chills, subjective fever, nausea, headache and sinus pressure, a cough that is non-productive and a shooting pain down left thigh for the past couple of days.  The sxs were worse yesterday.  Pt has been trying to treat with hot tea and lemon and did take Tylenol 1067m around 1130 this morning.  C/o shortness of breath when ambulating long distances.  Hx of COPD.

## 2015-02-25 NOTE — ED Notes (Signed)
Pt asking about Z-pack prescription. Provided education on antibiotics do not work for viruses and that we are treating her sxs.  Also instructed patient to follow up with PCP in 2 days.

## 2015-02-25 NOTE — ED Provider Notes (Signed)
CSN: [id number]     Arrival date & time 02/25/15  1424 History   First MD Initiated Contact with Patient 02/25/15 1452     No chief complaint on file.     HPI Comments: 65 year old female presents today complaining of alternating sweats and chills, nasal congestion and cough for the past 2 days. Has not measured her temperature at home. Has also experienced some pain over her frontal and maxillary sinuses. Took some tylenol just prior to arrival with minimal relief. Has not taken any over the counter cold medicines. Reports she thinks she contracted the illness from her husband who works at the jail. Has also experienced nausea, no diarrhea or abdominal pain.  Pt also reports a sharp, shooting pain in her left thigh since receiving a lumbar facet injection 6 days ago. No numbness, tingling, loss of bowel or bladder function, perianal or genital numbness. Has chronic low back pain follow by Dr. Dossie Arbour  The history is provided by the patient.    Past Medical History  Diagnosis Date  . Hypertension   . Diabetes mellitus   . Hepatitis C     Dr. 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 Asthma   . Insomnia   . Depression    Past Surgical History  Procedure Laterality Date  . Back surgery  2013    [facility] Dr. Mauri Pole  . Hysteroscopy w/d&c N/A 08/18/2014    Procedure: DILATATION AND CURETTAGE /HYSTEROSCOPY;  Surgeon: Lorette Ang, MD;  Location: [facility] ORS;  Service: Gynecology;  Laterality: N/A;  . Knee arthroscopy Right   . Colonoscopy w/ polypectomy     Family History  Problem Relation Age of Onset  . Coronary artery disease Mother 66  . Coronary artery disease Brother 8  . Heart attack Brother     MI  . Diabetes Other   . Hypertension Other   . Colon cancer Other    Social History  Substance Use Topics  . Smoking status: Former Smoker -- 1.00 packs/day for 20 years    Quit date: 01/21/2004  . Smokeless tobacco: None  . Alcohol Use: 0.0 - 0.6 oz/week    0-1 Glasses of  wine per week     Comment: Social   OB History    No data available     Review of Systems  Constitutional: Negative for fever and chills.  HENT: Positive for congestion, rhinorrhea and sinus pressure.   Respiratory: Positive for cough. Negative for wheezing.   Gastrointestinal: Positive for nausea. Negative for vomiting, abdominal pain and diarrhea.  Musculoskeletal: Positive for myalgias and arthralgias.  Skin: Negative for rash.  Neurological: Positive for headaches. Negative for dizziness.  All other systems reviewed and are negative.     Allergies  Codeine; Dilaudid; Percocet; Tramadol; Gabapentin; Hydrocodone-acetaminophen; Promethazine; and Scallops  Home Medications   Prior to Admission medications   Medication Sig Start Date End Date Taking? Authorizing Provider  acetaminophen (TYLENOL) 500 MG tablet Take 1,000-1,500 mg by mouth every 6 (six) hours as needed for mild pain.    Historical Provider, MD  albuterol (PROVENTIL HFA;VENTOLIN HFA) 108 (90 Base) MCG/ACT inhaler Inhale 2 puffs into the lungs every 6 (six) hours as needed for wheezing or shortness of breath.    Historical Provider, MD  ALPRAZolam Duanne Moron) 0.25 MG tablet Take 0.125-0.25 mg by mouth 2 (two) times daily as needed for anxiety or sleep.    Historical Provider, MD  Ascorbic Acid (VITAMIN C PO)  Take 1 tablet by mouth at bedtime.    Historical Provider, MD  aspirin EC 81 MG tablet Take 81 mg by mouth at bedtime.    Historical Provider, MD  benzonatate (TESSALON PERLES) 100 MG capsule Take 1 capsule (100 mg total) by mouth 3 (three) times daily as needed for cough. 02/25/15 02/25/16  Harvest Dark, PA-C  felodipine (PLENDIL) 10 MG 24 hr tablet Take 10 mg by mouth at bedtime.    Historical Provider, MD  metFORMIN (GLUCOPHAGE-XR) 500 MG 24 hr tablet Take 1,000 mg by mouth every evening.    Historical Provider, MD  ondansetron (ZOFRAN-ODT) 8 MG disintegrating tablet Take 1 tablet (8 mg total) by mouth 3 (three) times  daily as needed for nausea or vomiting. 01/31/15   Milinda Pointer, MD  oxyCODONE (OXY IR/ROXICODONE) 5 MG immediate release tablet Take 0.5 tablets (2.5 mg total) by mouth every 4 (four) hours as needed for moderate pain or severe pain. 01/31/15   Milinda Pointer, MD  predniSONE (DELTASONE) 20 MG tablet Take 2 tablets (40 mg total) by mouth daily. 02/25/15 03/02/15  Harvest Dark, PA-C  ranitidine (ZANTAC) 150 MG tablet Take 150 mg by mouth 2 (two) times daily as needed for heartburn.     Historical Provider, MD  sodium chloride (OCEAN) 0.65 % SOLN nasal spray Place 1 spray into both nostrils as needed for congestion. 02/25/15   Harvest Dark, PA-C  spironolactone (ALDACTONE) 25 MG tablet Take 25 mg by mouth daily.     Historical Provider, MD  traZODone (DESYREL) 50 MG tablet Take by mouth. 01/04/15 01/04/16  Historical Provider, MD  zolpidem (AMBIEN) 10 MG tablet Take 5-10 mg by mouth at bedtime as needed for sleep.    Historical Provider, MD   BP 137/86 mmHg  Pulse 97  Temp(Src) 98.2 F (36.8 C) (Oral)  Resp 20  Ht _0  (1.626 m)  Wt 82.555 kg  BMI 31.22 kg/m2  SpO2 96% Physical Exam  Constitutional: She is oriented to person, place, and time. Vital signs are normal. She appears well-developed and well-nourished. She is active.  Non-toxic appearance. She does not have a sickly appearance. She does not appear ill.  HENT:  Head: Normocephalic and atraumatic.  Right Ear: Tympanic membrane and external ear normal.  Left Ear: Tympanic membrane and external ear normal.  Nose: Mucosal edema and rhinorrhea present.  Mouth/Throat: Uvula is midline, oropharynx is clear and moist and mucous membranes are normal.  TTP over left frontal and maxillary sinuses  Eyes: Conjunctivae and EOM are normal. Pupils are equal, round, and reactive to light.  Neck: Normal range of motion. Neck supple.  Cardiovascular: Normal rate, regular rhythm, normal heart sounds and intact distal pulses.  Exam reveals no  gallop and no friction rub.   No murmur heard. Pulmonary/Chest: Effort normal. No respiratory distress. She has wheezes. She has no rales.  Musculoskeletal: Normal range of motion.  TTP over left upper thigh.   Lymphadenopathy:    She has no cervical adenopathy.  Neurological: She is alert and oriented to person, place, and time. No cranial nerve deficit.  Skin: Skin is warm and dry.  Psychiatric: She has a normal mood and affect. Her behavior is normal. Judgment and thought content normal.  Nursing note and vitals reviewed.   ED Course  Procedures (including critical care time) Labs Review Labs Reviewed - No data to display  Imaging Review Dg Chest 2 View  02/25/2015  CLINICAL DATA:  Patient with chills  and fever. EXAM: CHEST  2 VIEW COMPARISON:  Chest radiograph 12/23/2014 FINDINGS: Stable cardiac and mediastinal contours. No consolidative pulmonary opacities. No pleural effusion or pneumothorax. Minimal scarring and or atelectasis left lung base. IMPRESSION: No acute cardiopulmonary process. Electronically Signed   By: Lovey Newcomer M.D.   On: 02/25/2015 15:19   I have personally reviewed and evaluated these images and lab results as part of my medical decision-making.   EKG Interpretation None     I personally reviewed and interpreted CXR and it appears unchanged from prior film dated December 2016 - chronic appearing scarring/stranding in left base  MDM  Advised left thigh pain likely related to chronic low back pain and recent injection. No red flag symptoms that would warrant further work up at this time. She will follow up with Dr. Dossie Arbour for further care on Monday.  Viral URI Push fluids, mucinex DM over the counter Prednisone 46m x 5 days Tessalon perles for cough Ocean nasal spray  Tylenol or motrin for headache Follow up with PCP in 2 days  Final diagnoses:  Viral URI        EHarvest Dark PA-C 02/25/15 [place] MD 02/26/15 2150

## 2015-02-25 NOTE — Discharge Instructions (Signed)
Upper Respiratory Infection, Adult Most upper respiratory infections (URIs) are a viral infection of the air passages leading to the lungs. A URI affects the nose, throat, and upper air passages. The most common type of URI is nasopharyngitis and is typically referred to as "the common cold." URIs run their course and usually go away on their own. Most of the time, a URI does not require medical attention, but sometimes a bacterial infection in the upper airways can follow a viral infection. This is called a secondary infection. Sinus and middle ear infections are common types of secondary upper respiratory infections. Bacterial pneumonia can also complicate a URI. A URI can worsen asthma and chronic obstructive pulmonary disease (COPD). Sometimes, these complications can require emergency medical care and may be life threatening.  CAUSES Almost all URIs are caused by viruses. A virus is a type of germ and can spread from one person to another.  RISKS FACTORS You may be at risk for a URI if:   You smoke.   You have chronic heart or lung disease.  You have a weakened defense (immune) system.   You are very young or very old.   You have nasal allergies or asthma.  You work in crowded or poorly ventilated areas.  You work in health care facilities or schools. SIGNS AND SYMPTOMS  Symptoms typically develop 2-3 days after you come in contact with a cold virus. Most viral URIs last 7-10 days. However, viral URIs from the influenza virus (flu virus) can last 14-18 days and are typically more severe. Symptoms may include:   Runny or stuffy (congested) nose.   Sneezing.   Cough.   Sore throat.   Headache.   Fatigue.   Fever.   Loss of appetite.   Pain in your forehead, behind your eyes, and over your cheekbones (sinus pain).  Muscle aches.  DIAGNOSIS  Your health care provider may diagnose a URI by:  Physical exam.  Tests to check that your symptoms are not due to  another condition such as:  Strep throat.  Sinusitis.  Pneumonia.  Asthma. TREATMENT  A URI goes away on its own with time. It cannot be cured with medicines, but medicines may be prescribed or recommended to relieve symptoms. Medicines may help:  Reduce your fever.  Reduce your cough.  Relieve nasal congestion. HOME CARE INSTRUCTIONS   Take medicines only as directed by your health care provider.   Gargle warm saltwater or take cough drops to comfort your throat as directed by your health care provider.  Use a warm mist humidifier or inhale steam from a shower to increase air moisture. This may make it easier to breathe.  Drink enough fluid to keep your urine clear or pale yellow.   Eat soups and other clear broths and maintain good nutrition.   Rest as needed.   Return to work when your temperature has returned to normal or as your health care provider advises. You may need to stay home longer to avoid infecting others. You can also use a face mask and careful hand washing to prevent spread of the virus.  Increase the usage of your inhaler if you have asthma.   Do not use any tobacco products, including cigarettes, chewing tobacco, or electronic cigarettes. If you need help quitting, ask your health care provider. PREVENTION  The best way to protect yourself from getting a cold is to practice good hygiene.   Avoid oral or hand contact with people with cold  symptoms.   Wash your hands often if contact occurs.  There is no clear evidence that vitamin C, vitamin E, echinacea, or exercise reduces the chance of developing a cold. However, it is always recommended to get plenty of rest, exercise, and practice good nutrition.  SEEK MEDICAL CARE IF:   You are getting worse rather than better.   Your symptoms are not controlled by medicine.   You have chills.  You have worsening shortness of breath.  You have brown or red mucus.  You have yellow or brown nasal  discharge.  You have pain in your face, especially when you bend forward.  You have a fever.  You have swollen neck glands.  You have pain while swallowing.  You have white areas in the back of your throat. SEEK IMMEDIATE MEDICAL CARE IF:   You have severe or persistent:  Headache.  Ear pain.  Sinus pain.  Chest pain.  You have chronic lung disease and any of the following:  Wheezing.  Prolonged cough.  Coughing up blood.  A change in your usual mucus.  You have a stiff neck.  You have changes in your:  Vision.  Hearing.  Thinking.  Mood. MAKE SURE YOU:   Understand these instructions.  Will watch your condition.  Will get help right away if you are not doing well or get worse.   This information is not intended to replace advice given to you by your health care provider. Make sure you discuss any questions you have with your health care provider.   Document Released: 07/02/2000 Document Revised: 05/23/2014 Document Reviewed: 04/13/2013 Elsevier Interactive Patient Education [employer].

## 2015-03-01 ENCOUNTER — Encounter: Payer: Medicare Other | Admitting: Pain Medicine

## 2015-03-01 NOTE — Progress Notes (Signed)
Quick Note:  NOTE: This forensic urine drug screen (UDS) test was conducted using a state-of-the-art ultra high performance liquid chromatography and mass spectrometry system (UPLC/MS-MS), the most sophisticated and accurate method available. UPLC/MS-MS is 1,000 times more precise and accurate than standard gas chromatography and mass spectrometry (GC/MS). This system can analyze 26 drug categories and 180 drug compounds.  The results of this test came back with unexpected findings. No oxycodone or alprazolam found. ______

## 2015-03-06 ENCOUNTER — Encounter: Payer: Medicare Other | Admitting: Pain Medicine

## 2015-03-07 ENCOUNTER — Encounter: Payer: Medicare Other | Admitting: Pain Medicine

## 2015-03-13 ENCOUNTER — Ambulatory Visit
Admission: RE | Admit: 2015-03-13 | Discharge: 2015-03-13 | Disposition: A | Discharge status: Home / Self Care | Payer: 59 | Source: Ambulatory Visit | Attending: Gastroenterology | Admitting: Gastroenterology

## 2015-03-13 ENCOUNTER — Ambulatory Visit: Payer: 59 | Admitting: Anesthesiology

## 2015-03-13 ENCOUNTER — Encounter: Payer: Self-pay | Admitting: Anesthesiology

## 2015-03-13 ENCOUNTER — Encounter
Admission: RE | Disposition: A | Discharge status: Home / Self Care | Payer: Self-pay | Source: Ambulatory Visit | Attending: Gastroenterology

## 2015-03-13 DIAGNOSIS — Z79899 Other long term (current) drug therapy: Secondary | ICD-10-CM | POA: Insufficient documentation

## 2015-03-13 DIAGNOSIS — Z888 Allergy status to other drugs, medicaments and biological substances status: Secondary | ICD-10-CM | POA: Insufficient documentation

## 2015-03-13 DIAGNOSIS — F329 Major depressive disorder, single episode, unspecified: Secondary | ICD-10-CM | POA: Insufficient documentation

## 2015-03-13 DIAGNOSIS — I10 Essential (primary) hypertension: Secondary | ICD-10-CM | POA: Diagnosis not present

## 2015-03-13 DIAGNOSIS — Z7984 Long term (current) use of oral hypoglycemic drugs: Secondary | ICD-10-CM | POA: Insufficient documentation

## 2015-03-13 DIAGNOSIS — K640 First degree hemorrhoids: Secondary | ICD-10-CM | POA: Insufficient documentation

## 2015-03-13 DIAGNOSIS — J45909 Unspecified asthma, uncomplicated: Secondary | ICD-10-CM | POA: Insufficient documentation

## 2015-03-13 DIAGNOSIS — Z7982 Long term (current) use of aspirin: Secondary | ICD-10-CM | POA: Insufficient documentation

## 2015-03-13 DIAGNOSIS — I739 Peripheral vascular disease, unspecified: Secondary | ICD-10-CM | POA: Insufficient documentation

## 2015-03-13 DIAGNOSIS — E119 Type 2 diabetes mellitus without complications: Secondary | ICD-10-CM | POA: Diagnosis not present

## 2015-03-13 DIAGNOSIS — Z85038 Personal history of other malignant neoplasm of large intestine: Secondary | ICD-10-CM | POA: Insufficient documentation

## 2015-03-13 DIAGNOSIS — M1990 Unspecified osteoarthritis, unspecified site: Secondary | ICD-10-CM | POA: Diagnosis not present

## 2015-03-13 DIAGNOSIS — Z87891 Personal history of nicotine dependence: Secondary | ICD-10-CM | POA: Diagnosis not present

## 2015-03-13 DIAGNOSIS — Z885 Allergy status to narcotic agent status: Secondary | ICD-10-CM | POA: Insufficient documentation

## 2015-03-13 DIAGNOSIS — Z91013 Allergy to seafood: Secondary | ICD-10-CM | POA: Insufficient documentation

## 2015-03-13 DIAGNOSIS — Z8619 Personal history of other infectious and parasitic diseases: Secondary | ICD-10-CM | POA: Insufficient documentation

## 2015-03-13 DIAGNOSIS — N289 Disorder of kidney and ureter, unspecified: Secondary | ICD-10-CM | POA: Diagnosis not present

## 2015-03-13 DIAGNOSIS — Z09 Encounter for follow-up examination after completed treatment for conditions other than malignant neoplasm: Secondary | ICD-10-CM | POA: Insufficient documentation

## 2015-03-13 DIAGNOSIS — G4700 Insomnia, unspecified: Secondary | ICD-10-CM | POA: Diagnosis not present

## 2015-03-13 HISTORY — PX: COLONOSCOPY WITH PROPOFOL: SHX5780

## 2015-03-13 LAB — GLUCOSE, CAPILLARY: Glucose-Capillary: 114 mg/dL — ABNORMAL HIGH (ref 65–99)

## 2015-03-13 SURGERY — COLONOSCOPY WITH PROPOFOL
Anesthesia: General

## 2015-03-13 MED ORDER — FENTANYL CITRATE (PF) 100 MCG/2ML IJ SOLN
INTRAMUSCULAR | Status: DC | PRN
  Administered 2015-03-13: 50 ug via INTRAVENOUS

## 2015-03-13 MED ORDER — SODIUM CHLORIDE 0.9 % IV SOLN
INTRAVENOUS | Status: DC
  Administered 2015-03-13: 1000 mL via INTRAVENOUS

## 2015-03-13 MED ORDER — PHENYLEPHRINE HCL 10 MG/ML IJ SOLN
INTRAMUSCULAR | Status: DC | PRN
  Administered 2015-03-13: 100 ug via INTRAVENOUS

## 2015-03-13 MED ORDER — PROPOFOL 500 MG/50ML IV EMUL
INTRAVENOUS | Status: DC | PRN
  Administered 2015-03-13: 160 ug/kg/min via INTRAVENOUS

## 2015-03-13 MED ORDER — PROPOFOL 10 MG/ML IV BOLUS
INTRAVENOUS | Status: DC | PRN
  Administered 2015-03-13: 100 mg via INTRAVENOUS

## 2015-03-13 MED ORDER — SODIUM CHLORIDE 0.9 % IV SOLN: INTRAVENOUS | Status: DC

## 2015-03-13 MED ORDER — LIDOCAINE HCL (CARDIAC) 20 MG/ML IV SOLN
INTRAVENOUS | Status: DC | PRN
  Administered 2015-03-13: 100 mg via INTRAVENOUS

## 2015-03-13 MED ORDER — MIDAZOLAM HCL 5 MG/5ML IJ SOLN
INTRAMUSCULAR | Status: DC | PRN
  Administered 2015-03-13: 1 mg via INTRAVENOUS

## 2015-03-13 NOTE — Anesthesia Preprocedure Evaluation (Signed)
Anesthesia Evaluation  Patient identified by MRN, date of birth, ID band Patient awake    Reviewed: Allergy & Precautions, H&P , NPO status , Patient's Chart, lab work & pertinent test results  History of Anesthesia Complications Negative for: history of anesthetic complications  Airway Mallampati: III  TM Distance: >3 FB Neck ROM: full    Dental  (+) Poor Dentition, Chipped, Missing   Pulmonary neg shortness of breath, asthma , COPD, former smoker,    Pulmonary exam normal breath sounds clear to auscultation       Cardiovascular Exercise Tolerance: Good hypertension, (-) angina+ Peripheral Vascular Disease  (-) Past MI and (-) DOE Normal cardiovascular exam Rhythm:regular Rate:Normal     Neuro/Psych PSYCHIATRIC DISORDERS Depression  Neuromuscular disease    GI/Hepatic negative GI ROS, (+) Hepatitis -, C  Endo/Other  diabetes, Well Controlled, Type 2  Renal/GU Renal disease  negative genitourinary   Musculoskeletal  (+) Arthritis ,   Abdominal   Peds  Hematology negative hematology ROS (+)   Anesthesia Other Findings Past Medical History:   Hypertension                                                 Diabetes mellitus                                            Hepatitis C                                                    Comment:Dr. Staci Acosta pt took part in a study, now cured   Asthma                                                       Insomnia                                                     Depression                                                  Past Surgical History:   BACK SURGERY                                     2013           Comment:[facility] Dr. Mauri Pole   HYSTEROSCOPY W/D&C                              N/A 08/18/2014      Comment:Procedure: DILATATION  AND CURETTAGE               /HYSTEROSCOPY;  Surgeon: Lorette Ang, MD;               Location: [facility] ORS;  Service: Gynecology;     Laterality: N/A;   KNEE ARTHROSCOPY                                Right              COLONOSCOPY W/ POLYPECTOMY                                   BMI    Body Mass Index   32.24 kg/m 2      Reproductive/Obstetrics negative OB ROS                             Anesthesia Physical Anesthesia Plan  ASA: III  Anesthesia Plan: General   Post-op Pain Management:    Induction:   Airway Management Planned:   Additional Equipment:   Intra-op Plan:   Post-operative Plan:   Informed Consent: I have reviewed the patients History and Physical, chart, labs and discussed the procedure including the risks, benefits and alternatives for the proposed anesthesia with the patient or authorized representative who has indicated his/her understanding and acceptance.   Dental Advisory Given  Plan Discussed with: Anesthesiologist, CRNA and Surgeon  Anesthesia Plan Comments:         Anesthesia Quick Evaluation

## 2015-03-13 NOTE — Anesthesia Postprocedure Evaluation (Signed)
Anesthesia Post Note  Patient: Stephanie Short  Procedure(s) Performed: Procedure(s) (LRB): COLONOSCOPY WITH PROPOFOL (N/A)  Patient location during evaluation: Endoscopy Anesthesia Type: General Level of consciousness: awake and alert Pain management: pain level controlled Vital Signs Assessment: post-procedure vital signs reviewed and stable Respiratory status: spontaneous breathing, nonlabored ventilation, respiratory function stable and patient connected to nasal cannula oxygen Cardiovascular status: blood pressure returned to baseline and stable Postop Assessment: no signs of nausea or vomiting Anesthetic complications: no    Last Vitals:  Filed Vitals:   03/13/15 1140 03/13/15 1150  BP: 114/82 115/76  Pulse: 88 85  Temp:    Resp: 22 16    Last Pain: There were no vitals filed for this visit.               Precious Haws Sidharth Leverette

## 2015-03-13 NOTE — H&P (Signed)
Outpatient short stay form Pre-procedure 03/13/2015 10:47 AM Lollie Sails MD  Primary Physician: Dr. Frazier Richards  Reason for visit:  Colonoscopy  History of present illness:  She is a 65 year old female presenting today for colonoscopy. She has a history of having a colon polyp removed several years ago that had a focus of cancer. Did not require any other treatment or surgery. Her last colonoscopy was 2014 showing no polyps that time. There is a tattoo in the sigmoid colon. He tolerated her prep well. She takes no aspirin products with the exception of a 81 mg daily aspirin or blood thinners.    Current facility-administered medications:  .  0.9 %  sodium chloride infusion, , Intravenous, Continuous, Lollie Sails, MD, Last Rate: 20 mL/hr at 03/13/15 1012, 1,000 mL at 03/13/15 1012 .  0.9 %  sodium chloride infusion, , Intravenous, Continuous, Lollie Sails, MD  Facility-administered medications prior to admission  Medication Dose Route Frequency Provider Last Rate Last Dose  . lidocaine (PF) (XYLOCAINE) 1 % injection 10 mL  10 mL Other Once Milinda Pointer, MD       Prescriptions prior to admission  Medication Sig Dispense Refill Last Dose  . aspirin EC 81 MG tablet Take 81 mg by mouth at bedtime.   Past Week at Unknown time  . benzonatate (TESSALON PERLES) 100 MG capsule Take 1 capsule (100 mg total) by mouth 3 (three) times daily as needed for cough. 30 capsule 0 Past Week at Unknown time  . felodipine (PLENDIL) 10 MG 24 hr tablet Take 10 mg by mouth at bedtime.   03/12/2015 at 2300  . metFORMIN (GLUCOPHAGE-XR) 500 MG 24 hr tablet Take 1,000 mg by mouth every evening.   Past Week at Unknown time  . ondansetron (ZOFRAN-ODT) 8 MG disintegrating tablet Take 1 tablet (8 mg total) by mouth 3 (three) times daily as needed for nausea or vomiting. 30 tablet 0 Past Week at Unknown time  . oxyCODONE (OXY IR/ROXICODONE) 5 MG immediate release tablet Take 0.5 tablets (2.5 mg  total) by mouth every 4 (four) hours as needed for moderate pain or severe pain. 90 tablet 0 Past Week at Unknown time  . ranitidine (ZANTAC) 150 MG tablet Take 150 mg by mouth 2 (two) times daily as needed for heartburn.    Past Week at Unknown time  . sodium chloride (OCEAN) 0.65 % SOLN nasal spray Place 1 spray into both nostrils as needed for congestion. 30 mL 0 Past Week at Unknown time  . spironolactone (ALDACTONE) 25 MG tablet Take 25 mg by mouth daily.    03/13/2015 at 0530  . traZODone (DESYREL) 50 MG tablet Take by mouth.   Past Week at Unknown time  . zolpidem (AMBIEN) 10 MG tablet Take 5-10 mg by mouth at bedtime as needed for sleep.   Past Week at Unknown time  . acetaminophen (TYLENOL) 500 MG tablet Take 1,000-1,500 mg by mouth every 6 (six) hours as needed for mild pain.   Taking  . albuterol (PROVENTIL HFA;VENTOLIN HFA) 108 (90 Base) MCG/ACT inhaler Inhale 2 puffs into the lungs every 6 (six) hours as needed for wheezing or shortness of breath.   Taking  . ALPRAZolam (XANAX) 0.25 MG tablet Take 0.125-0.25 mg by mouth 2 (two) times daily as needed for anxiety or sleep.   Taking  . Ascorbic Acid (VITAMIN C PO) Take 1 tablet by mouth at bedtime.   Taking     Allergies  Allergen Reactions  .  Codeine Nausea And Vomiting  . Dilaudid [Hydromorphone Hcl] Nausea And Vomiting  . Percocet [Oxycodone-Acetaminophen] Nausea And Vomiting  . Tramadol Nausea And Vomiting  . Gabapentin Nausea And Vomiting and Other (See Comments)    Reaction:  Dizziness and GI upset  . Hydrocodone-Acetaminophen Nausea And Vomiting  . Promethazine Other (See Comments)    Reaction:  GI upset   . Scallops [Shellfish Allergy] Nausea And Vomiting     Past Medical History  Diagnosis Date  . Hypertension   . Diabetes mellitus   . Hepatitis C     Dr. 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 Asthma   . Insomnia   . Depression     Review of systems:      Physical Exam    Heart and lungs: Regular  rate and rhythm without rub or gallop, lungs are bilaterally clear.    HEENT: Normocephalic atraumatic eyes are anicteric    Other:     Pertinant exam for procedure: Soft nontender nondistended bowel sounds positive normoactive    Planned proceedures: Colonoscopy and indicated procedures. I have discussed the risks benefits and complications of procedures to include not limited to bleeding, infection, perforation and the risk of sedation and the patient wishes to proceed.  It is of note the patient also has a recent history of some severe chest pain for which she underwent CT scanning. Told that this was likely related to his stomach issue. She does intermittently have some right upper quadrant discomfort. We did discuss this. She has had a gallbladder evaluation in the past. I've indicated to her that we will do a GI follow-up after this procedure and will double check on those symptoms. It may be necessary to do a EGD or perhaps HIDA scan for further evaluation they are not resolve completely. He stated that they have improved a lot and is taking a proton pump inhibitor currently.    Lollie Sails, MD Gastroenterology 03/13/2015  10:47 AM

## 2015-03-13 NOTE — Transfer of Care (Signed)
Immediate Anesthesia Transfer of Care Note  Patient: Stephanie Short  Procedure(s) Performed: Procedure(s): COLONOSCOPY WITH PROPOFOL (N/A)  Patient Location: PACU  Anesthesia Type:General  Level of Consciousness: awake and patient cooperative  Airway & Oxygen Therapy: Patient Spontanous Breathing and Patient connected to nasal cannula oxygen  Post-op Assessment: Report given to RN and Post -op Vital signs reviewed and stable  Post vital signs: Reviewed and stable  Last Vitals:  Filed Vitals:   03/13/15 1118 03/13/15 1121  BP: 86/59 86/59  Pulse: 97 92  Temp: 36.1 C 36.1 C  Resp: 17 18    Complications: No apparent anesthesia complications

## 2015-03-13 NOTE — Op Note (Signed)
[facility] Patient Name: Stephanie Short Procedure Date: 03/13/2015 10:45 AM MRN: [id number] Account #: [id number] Date of Birth: May 05, 1950 Admit Type: Outpatient Age: 65 Room: [facility] ENDO ROOM 3 Gender: Female Note Status: Finalized Procedure:            Colonoscopy Indications:          Personal history of malignant neoplasm of the colon Providers:            Lollie Sails, MD Referring MD:         Ocie Cornfield. Ouida Sills, MD (Referring MD) Medicines:            Monitored Anesthesia Care Complications:        No immediate complications. Procedure:            Pre-Anesthesia Assessment:                       - ASA Grade Assessment: III - A patient with severe                        systemic disease.                       After obtaining informed consent, the colonoscope was                        passed under direct vision. Throughout the procedure,                        the patient's blood pressure, pulse, and oxygen                        saturations were monitored continuously. The                        Colonoscope was introduced through the anus and                        advanced to the the cecum, identified by appendiceal                        orifice and ileocecal valve. The patient tolerated the                        procedure well. The quality of the bowel preparation                        was good. Findings:      The colon (entire examined portion) appeared normal.      Non-bleeding internal hemorrhoids were found during retroflexion. The       hemorrhoids were Grade I (internal hemorrhoids that do not prolapse).      The digital rectal exam was normal.      A tattoo was seen in the sigmoid colon. The tattoo site appeared normal.      A few small-mouthed diverticula were found in the descending colon and       transverse colon. Impression:           - The entire examined colon is normal.                       - Non-bleeding  internal hemorrhoids.                       - A tattoo was seen in the sigmoid colon. The tattoo                        site appeared normal.                       - No specimens collected. Recommendation:       - Discharge patient to home. Procedure Code(s):    --- Professional ---                       [phone], Colonoscopy, flexible; diagnostic, including                        collection of specimen(s) by brushing or washing, when                        performed (separate procedure) Diagnosis Code(s):    --- Professional ---                       K64.8, Other hemorrhoids                       Z85.038, Personal history of other malignant neoplasm                        of large intestine CPT copyright 2016 American Medical Association. All rights reserved. The codes documented in this report are preliminary and upon coder review may  be revised to meet current compliance requirements. Lollie Sails, MD 03/13/2015 11:18:09 AM This report has been signed electronically. Number of Addenda: 0 Note Initiated On: 03/13/2015 10:45 AM Scope Withdrawal Time: 0 hours 8 minutes 48 seconds  Total Procedure Duration: 0 hours 17 minutes 47 seconds       [facility]

## 2015-03-14 ENCOUNTER — Encounter: Payer: Self-pay | Admitting: Gastroenterology

## 2015-03-21 ENCOUNTER — Encounter: Payer: Medicare Other | Admitting: Pain Medicine

## 2015-03-29 ENCOUNTER — Encounter: Payer: 59 | Admitting: Pain Medicine

## 2015-04-24 ENCOUNTER — Encounter: Payer: Self-pay | Admitting: Pain Medicine

## 2015-04-24 ENCOUNTER — Ambulatory Visit: Payer: 59 | Attending: Pain Medicine | Admitting: Pain Medicine

## 2015-04-24 VITALS — BP 143/80 | HR 106 | Temp 98.6°F | Resp 16 | Ht 63.0 in | Wt 179.0 lb

## 2015-04-24 DIAGNOSIS — Z87891 Personal history of nicotine dependence: Secondary | ICD-10-CM | POA: Diagnosis not present

## 2015-04-24 DIAGNOSIS — M79605 Pain in left leg: Secondary | ICD-10-CM | POA: Diagnosis not present

## 2015-04-24 DIAGNOSIS — M79604 Pain in right leg: Secondary | ICD-10-CM | POA: Diagnosis not present

## 2015-04-24 DIAGNOSIS — E119 Type 2 diabetes mellitus without complications: Secondary | ICD-10-CM | POA: Diagnosis not present

## 2015-04-24 DIAGNOSIS — J45909 Unspecified asthma, uncomplicated: Secondary | ICD-10-CM | POA: Diagnosis not present

## 2015-04-24 DIAGNOSIS — Z9889 Other specified postprocedural states: Secondary | ICD-10-CM | POA: Insufficient documentation

## 2015-04-24 DIAGNOSIS — E782 Mixed hyperlipidemia: Secondary | ICD-10-CM | POA: Diagnosis not present

## 2015-04-24 DIAGNOSIS — N950 Postmenopausal bleeding: Secondary | ICD-10-CM | POA: Insufficient documentation

## 2015-04-24 DIAGNOSIS — M5412 Radiculopathy, cervical region: Secondary | ICD-10-CM | POA: Diagnosis not present

## 2015-04-24 DIAGNOSIS — M8580 Other specified disorders of bone density and structure, unspecified site: Secondary | ICD-10-CM | POA: Insufficient documentation

## 2015-04-24 DIAGNOSIS — G4700 Insomnia, unspecified: Secondary | ICD-10-CM | POA: Insufficient documentation

## 2015-04-24 DIAGNOSIS — M545 Low back pain: Secondary | ICD-10-CM | POA: Insufficient documentation

## 2015-04-24 DIAGNOSIS — E7800 Pure hypercholesterolemia, unspecified: Secondary | ICD-10-CM | POA: Diagnosis not present

## 2015-04-24 DIAGNOSIS — I700 Atherosclerosis of aorta: Secondary | ICD-10-CM | POA: Insufficient documentation

## 2015-04-24 DIAGNOSIS — Z5181 Encounter for therapeutic drug level monitoring: Secondary | ICD-10-CM

## 2015-04-24 DIAGNOSIS — G589 Mononeuropathy, unspecified: Secondary | ICD-10-CM | POA: Diagnosis not present

## 2015-04-24 DIAGNOSIS — Z79891 Long term (current) use of opiate analgesic: Secondary | ICD-10-CM

## 2015-04-24 DIAGNOSIS — I129 Hypertensive chronic kidney disease with stage 1 through stage 4 chronic kidney disease, or unspecified chronic kidney disease: Secondary | ICD-10-CM | POA: Insufficient documentation

## 2015-04-24 DIAGNOSIS — M25559 Pain in unspecified hip: Secondary | ICD-10-CM | POA: Diagnosis present

## 2015-04-24 DIAGNOSIS — M160 Bilateral primary osteoarthritis of hip: Secondary | ICD-10-CM | POA: Insufficient documentation

## 2015-04-24 DIAGNOSIS — M549 Dorsalgia, unspecified: Secondary | ICD-10-CM | POA: Diagnosis present

## 2015-04-24 DIAGNOSIS — F329 Major depressive disorder, single episode, unspecified: Secondary | ICD-10-CM | POA: Insufficient documentation

## 2015-04-24 DIAGNOSIS — G8929 Other chronic pain: Secondary | ICD-10-CM | POA: Diagnosis not present

## 2015-04-24 DIAGNOSIS — F1190 Opioid use, unspecified, uncomplicated: Secondary | ICD-10-CM

## 2015-04-24 DIAGNOSIS — B1920 Unspecified viral hepatitis C without hepatic coma: Secondary | ICD-10-CM | POA: Diagnosis not present

## 2015-04-24 DIAGNOSIS — M47816 Spondylosis without myelopathy or radiculopathy, lumbar region: Secondary | ICD-10-CM | POA: Diagnosis not present

## 2015-04-24 MED ORDER — OXYCODONE HCL 5 MG PO TABS: 2.5000 mg | ORAL_TABLET | ORAL | Status: DC | PRN

## 2015-04-24 NOTE — Patient Instructions (Signed)
Facet Blocks Patient Information  Description: The facets are joints in the spine between the vertebrae.  Like any joints in the body, facets can become irritated and painful.  Arthritis can also effect the facets.  By injecting steroids and local anesthetic in and around these joints, we can temporarily block the nerve supply to them.  Steroids act directly on irritated nerves and tissues to reduce selling and inflammation which often leads to decreased pain.  Facet blocks may be done anywhere along the spine from the neck to the low back depending upon the location of your pain.   After numbing the skin with local anesthetic (like Novocaine), a small needle is passed onto the facet joints under x-ray guidance.  You may experience a sensation of pressure while this is being done.  The entire block usually lasts about 15-25 minutes.   Conditions which may be treated by facet blocks:   Low back/buttock pain  Neck/shoulder pain  Certain types of headaches  Preparation for the injection:  1. Do not eat any solid food or dairy products within 8 hours of your appointment. 2. You may drink clear liquid up to 3 hours before appointment.  Clear liquids include water, black coffee, juice or soda.  No milk or cream please. 3. You may take your regular medication, including pain medications, with a sip of water before your appointment.  Diabetics should hold regular insulin (if taken separately) and take 1/2 normal NPH dose the morning of the procedure.  Carry some sugar containing items with you to your appointment. 4. A driver must accompany you and be prepared to drive you home after your procedure. 5. Bring all your current medications with you. 6. An IV may be inserted and sedation may be given at the discretion of the physician. 7. A blood pressure cuff, EKG and other monitors will often be applied during the procedure.  Some patients may need to have extra oxygen administered for a short  period. 8. You will be asked to provide medical information, including your allergies and medications, prior to the procedure.  We must know immediately if you are taking blood thinners (like Coumadin/Warfarin) or if you are allergic to IV iodine contrast (dye).  We must know if you could possible be pregnant.  Possible side-effects:   Bleeding from needle site  Infection (rare, may require surgery)  Nerve injury (rare)  Numbness & tingling (temporary)  Difficulty urinating (rare, temporary)  Spinal headache (a headache worse with upright posture)  Light-headedness (temporary)  Pain at injection site (serveral days)  Decreased blood pressure (rare, temporary)  Weakness in arm/leg (temporary)  Pressure sensation in back/neck (temporary)   Call if you experience:   Fever/chills associated with headache or increased back/neck pain  Headache worsened by an upright position  New onset, weakness or numbness of an extremity below the injection site  Hives or difficulty breathing (go to the emergency room)  Inflammation or drainage at the injection site(s)  Severe back/neck pain greater than usual  New symptoms which are concerning to you  Please note:  Although the local anesthetic injected can often make your back or neck feel good for several hours after the injection, the pain will likely return. It takes 3-7 days for steroids to work.  You may not notice any pain relief for at least one week.  If effective, we will often do a series of 2-3 injections spaced 3-6 weeks apart to maximally decrease your pain.  After the initial  series, you may be a candidate for a more permanent nerve block of the facets.  If you have any questions, please call #[phone]) [facility]

## 2015-04-24 NOTE — Progress Notes (Signed)
Patient's Name: Stephanie Short Patient type: Established  MRN: [id number] Service setting: Ambulatory outpatient  DOB: [birth year]   DOS: 04/24/2015    Primary Reason(s) for Visit: Encounter for post-procedure evaluation of chronic illness with mild to moderate exacerbation CC: Hip Pain and Back Pain   HPI  Stephanie Short is a 65 y.o. year old, female patient, who returns today as an established patient. She has HYPERLIPIDEMIA-MIXED; AORTIC ATHEROSCLEROSIS; Endometrial polyp; Chronic pain; Chronic low back pain (Location of Primary Source of Pain) (Bilateral) (R>L); Lumbar spondylosis; Failed back surgical syndrome; Lumbar facet syndrome (Bilateral) (R>L); Long term current use of opiate analgesic; Long term prescription opiate use; Opiate use (22.5 MME/Day); Encounter for therapeutic drug level monitoring; Encounter for chronic pain management; Airway hyperreactivity; Atherosclerosis of abdominal aorta ([place]); Cervical nerve root disorder; Diabetes mellitus ([place]); Benign essential HTN; Cannot sleep; Major depression in remission ([place]); Burning or prickling sensation; Hemorrhage, postmenopausal; Pure hypercholesterolemia; Osteopenia; Chronic hip pain (Bilateral) (L>R); Greater trochanteric bursitis of hips (Bilateral); Chronic sacroiliac joint pain (Bilateral); Osteoarthritis of hips (Bilateral); Opioid dependence, uncomplicated ([place]); Pain management; Chronic pain of lower extremity  (Bilateral) (L>R); Postmenopausal osteoporosis; Chronic cervical radicular pain; Neurogenic pain; and Chronic kidney disease on her problem list.. Her primarily concern today is the Hip Pain and Back Pain   Pain Assessment: Self-Reported Pain Score: 6  Reported level is compatible with observation Pain Type: Chronic pain Pain Location: Hip Pain Orientation: Left, Right Pain Descriptors / Indicators: Aching Pain Frequency: Intermittent  The patient comes in today clinics today for follow-up evaluation and medication  management of her chronic pain.  Date of Last Visit: 02/15/15 Service Provided on Last Visit: Procedure (blf)  Controlled Substance Pharmacotherapy Assessment  Analgesic: Oxycodone IR 5 mg, half to 1 tablet every 4 hours #90. (15 mg/day) Pill Count: The patient did not bring her empty bottles or medications to this appointment. Final warning given to the patient. MME/day: 22.5 mg/day.  Pharmacokinetics: Onset of action (Liberation/Absorption): Within expected pharmacological parameters Time to Peak effect (Distribution): Timing and results are as within normal expected parameters Duration of action (Metabolism/Excretion): Within normal limits for medication Pharmacodynamics: Analgesic Effect: More than 50% Activity Facilitation: Medication(s) allow patient to sit, stand, walk, and do the basic ADLs Perceived Effectiveness: Described as relatively effective, allowing for increase in activities of daily living (ADL) Side-effects or Adverse reactions: None reported Monitoring: [place] PMP: Online review of the past 35-monthperiod conducted. Compliant with practice rules and regulations UDS Results/interpretation: The patient's last UDS was done on 01/31/2015 and it came back abnormal due to the absence of declared oxycodone and alprazolam. The sample was completely negative for any opioids. Medication Assessment Form: Reviewed. Patient indicates being compliant with therapy Treatment compliance: Compliant Risk Assessment: Aberrant Behavior: None observed today Substance Use Disorder (SUD) Risk Level: Low Risk of opioid abuse or dependence: 0.7-3.0% with doses ? 36 MME/day and 6.1-26% with doses ? 120 MME/day. Opioid Risk Tool (ORT) Score: Total Score: 1 Low Risk for SUD (Score <3) Depression Scale Score: PHQ-2: PHQ-2 Total Score: 0 No depression (0) PHQ-9: PHQ-9 Total Score: 0 No depression (0-4)  Pharmacologic Plan: No change in therapy, at this time  Post-Procedure Assessment  Procedure  done on last visit: Diagnostic bilateral lumbar facet block under fluoroscopic guidance and IV sedation on 02/15/2015. Unfortunately, the patient did not keep her 2 week follow-up appointment after the procedure and she also did not complete the postprocedure pain diary. Side-effects or Adverse reactions: None reported Sedation: Sedation given  Results: Ultra-Short Term Relief (First 1 hour after procedure): 100 %  Analgesia during this period is likely to be Local Anesthetic and/or IV Sedative (Analgesic/Anxiolitic) related Short Term Relief (Initial 4-6 hrs after procedure): 0 % (questionable) No analgesic effect could suggest the source of pain to be elsewhere Long Term Relief : 0 % (questionable) No benefit could suggest etiology to be non-inflammatory, possibly compressive   Current Relief (Now): 0%  No persistent benefit would suggest no long-term anti-inflammatory benefit from intervention and/or persistent or recurrent aggravating factors Interpretation of Results: Unfortunately, this diagnostic injection cannot be relied on due to the fact that the patient did not keep her 2 week follow-up appointment and she also did not keep her postprocedure diary. This diagnostic injection will need to be repeated again.  Laboratory Chemistry  Inflammation Markers No results found for: ESRSEDRATE, CRP  Renal Function Lab Results  Component Value Date   BUN 20 12/23/2014   CREATININE 1.02* 12/23/2014   GFRAA >60 12/23/2014   GFRNONAA 56* 12/23/2014    Hepatic Function Lab Results  Component Value Date   AST 19 01/10/2013   ALT 21 01/10/2013   ALBUMIN 3.6 01/10/2013    Electrolytes Lab Results  Component Value Date   NA 138 12/23/2014   K 4.0 12/23/2014   CL 105 12/23/2014   CALCIUM 9.4 12/23/2014    Pain Modulating Vitamins No results found for: VD25OH, QJ194RD4YCX, KG8185UD1, SH7026VZ8, VITAMINB12  Coagulation Parameters Lab Results  Component Value Date   INR 1.0  09/16/2012   LABPROT 13.3 09/16/2012    Note: I personally reviewed the above data. Results shared with patient.  Meds  The patient has a current medication list which includes the following prescription(s): acetaminophen, albuterol, alprazolam, ascorbic acid, aspirin ec, felodipine, metformin, ondansetron, oxycodone, ranitidine, spironolactone, and zolpidem.  Current Outpatient Prescriptions on File Prior to Visit  Medication Sig  . acetaminophen (TYLENOL) 500 MG tablet Take 1,000-1,500 mg by mouth every 6 (six) hours as needed for mild pain.  Marland Kitchen albuterol (PROVENTIL HFA;VENTOLIN HFA) 108 (90 Base) MCG/ACT inhaler Inhale 2 puffs into the lungs every 6 (six) hours as needed for wheezing or shortness of breath.  . ALPRAZolam (XANAX) 0.25 MG tablet Take 0.125-0.25 mg by mouth 2 (two) times daily as needed for anxiety or sleep.  . Ascorbic Acid (VITAMIN C PO) Take 1 tablet by mouth at bedtime.  Marland Kitchen aspirin EC 81 MG tablet Take 81 mg by mouth at bedtime.  . felodipine (PLENDIL) 10 MG 24 hr tablet Take 10 mg by mouth at bedtime.  . metFORMIN (GLUCOPHAGE-XR) 500 MG 24 hr tablet Take 1,000 mg by mouth every evening.  . ondansetron (ZOFRAN-ODT) 8 MG disintegrating tablet Take 1 tablet (8 mg total) by mouth 3 (three) times daily as needed for nausea or vomiting.  Marland Kitchen spironolactone (ALDACTONE) 25 MG tablet Take 25 mg by mouth daily.   Marland Kitchen zolpidem (AMBIEN) 10 MG tablet Take 5-10 mg by mouth at bedtime as needed for sleep.   No current facility-administered medications on file prior to visit.    ROS  Constitutional: Afebrile, no chills, well hydrated and well nourished Gastrointestinal: No upper or lower GI bleeding, no nausea, no vomiting and no acute GI distress Musculoskeletal: No acute joint swelling or redness, no acute loss of range of motion and no acute onset weakness Neurological: Denies any acute onset apraxia, no episodes of paralysis, no acute loss of coordination, no acute loss of  consciousness and no acute onset aphasia, dysarthria,  agnosia, or amnesia  Allergies  Stephanie Short is allergic to codeine; dilaudid; percocet; tramadol; gabapentin; hydrocodone-acetaminophen; hydromorphone; promethazine; and scallops.  [place]  Medical:  Stephanie Short  has a past medical history of Hypertension; Diabetes mellitus; Hepatitis C; Asthma; Insomnia; and Depression. Family: family history includes Colon cancer in her other; Coronary artery disease (age of onset: 45) in her brother; Coronary artery disease (age of onset: 67) in her mother; Diabetes in her other; Heart attack in her brother; Hypertension in her other. Surgical:  has past surgical history that includes Back surgery (2013); Hysteroscopy w/D&C (N/A, 08/18/2014); Knee arthroscopy (Right); Colonoscopy w/ polypectomy; and Colonoscopy with propofol (N/A, 03/13/2015). Tobacco:  reports that she quit smoking about 11 years ago. She does not have any smokeless tobacco history on file. Alcohol:  reports that she drinks alcohol. Drug:  reports that she does not use illicit drugs.  Physical Examination  Constitutional Vitals:  Today's Vitals   04/24/15 1353 04/24/15 1355  BP:  143/80  Pulse: 106   Temp: 98.6 F (37 C)   Resp: 16   Height: _0  (1.6 m)   Weight: 179 lb (81.194 kg)   SpO2: 95%   PainSc: 6  6   PainLoc: Hip     Calculated BMI: Body mass index is 31.72 kg/(m^2). Obese (Class I) (30-34.9 kg/m2) - 68% higher incidence of chronic pain General appearance: alert, cooperative, appears stated age and no distress Eyes: PERLA Respiratory: No evidence respiratory distress, no audible rales or ronchi and no use of accessory muscles of respiration  Cervical Spine Inspection: Normal anatomy Alignment: Symetrical AROM: Adequate  Upper Extremities Right  Left  Inspection: No gross anomalies detected  Inspection: No gross anomalies detected  AROM: Adequate  AROM: Adequate  Sensory: Normal  Sensory: Normal  Motor:  Unremarkable       Motor: Unremarkable        Thoracic Spine Inspection: No gross anomalies detected Alignment: Symetrical AROM: Adequate  Lumbar Spine Inspection: No gross anomalies detected Alignment: Symetrical AROM: Decreased and positive bilaterally for lumbar facet pain  Gait: WNL  Lower Extremities Right  Left  Inspection: No gross anomalies detected  Inspection: No gross anomalies detected  AROM: Adequate  AROM: Adequate  Sensory:  Normal  Sensory:  Normal  Motor: Unremarkable       Motor: Unremarkable        Assessment & Plan  Primary Diagnosis & Pertinent Problem List: The primary encounter diagnosis was Chronic pain. Diagnoses of Encounter for therapeutic drug level monitoring, Long term current use of opiate analgesic, Chronic low back pain (Location of Primary Source of Pain) (Bilateral) (R>L), Lumbar facet syndrome (Bilateral) (R>L), Lumbar spondylosis, unspecified spinal osteoarthritis, and Opiate use (22.5 MME/Day) were also pertinent to this visit.  Visit Diagnosis: 1. Chronic pain   2. Encounter for therapeutic drug level monitoring   3. Long term current use of opiate analgesic   4. Chronic low back pain (Location of Primary Source of Pain) (Bilateral) (R>L)   5. Lumbar facet syndrome (Bilateral) (R>L)   6. Lumbar spondylosis, unspecified spinal osteoarthritis   7. Opiate use (22.5 MME/Day)     Problem-specific Plan(s): No problem-specific assessment & plan notes found for this encounter.   Plan of Care  Pharmacotherapy (Medications Ordered): Meds ordered this encounter  Medications  . oxyCODONE (OXY IR/ROXICODONE) 5 MG immediate release tablet    Sig: Take 0.5 tablets (2.5 mg total) by mouth every 4 (four) hours as needed for moderate pain or severe  pain.    Dispense:  90 tablet    Refill:  0    Do not place this medication, or any other prescription from our practice, on "Automatic Refill". Patient may have prescription filled one day early if  pharmacy is closed on scheduled refill date. Do not fill until: 04/24/15 To last until: 05/24/15    [facility] & Procedure Ordered: Orders Placed This Encounter  Procedures  . LUMBAR FACET(MEDIAL BRANCH NERVE BLOCK) MBNB    Standing Status: Future     Number of Occurrences:      Standing Expiration Date: 04/23/2016    Scheduling Instructions:     Side: Bilateral     Level: L2, L3, L4, L5, & S1 Medial Branch Nerve     Sedation: With Sedation.     Timeframe: ASAA    Order Specific Question:  Where will this procedure be performed?    Answer:  [facility]  . ToxASSURE Select 13 (MW), Urine    Volume: 30 ml(s). Minimum 3 ml of urine is needed. Document temperature of fresh sample. Indications: Long term (current) use of opiate analgesic (Z79.891)  . Comprehensive metabolic panel    Standing Status: Future     Number of Occurrences:      Standing Expiration Date: 05/24/2015    Order Specific Question:  Has the patient fasted?    Answer:  No  . C-reactive protein    Standing Status: Future     Number of Occurrences:      Standing Expiration Date: 05/24/2015  . Magnesium    Standing Status: Future     Number of Occurrences:      Standing Expiration Date: 05/24/2015  . Sedimentation rate    Standing Status: Future     Number of Occurrences:      Standing Expiration Date: 05/24/2015  . Vitamin B12    Standing Status: Future     Number of Occurrences:      Standing Expiration Date: 05/24/2015  . Vitamin D 1,25 dihydroxy    Standing Status: Future     Number of Occurrences:      Standing Expiration Date: 05/24/2015    Imaging Ordered: None  Interventional Therapies: Scheduled:  Repeat diagnostic bilateral lumbar facet block under thorascopic guidance and IV sedation.    Considering:  Lumbar facet radiofrequency ablation.    PRN Procedures:  None at this time.    Referral(s) or Consult(s): None at this time.  Medications administered during this visit: Stephanie Short had no  medications administered during this visit.  Future Appointments Date Time Provider [place]  05/08/2015 10:30 AM Milinda Pointer, MD [facility] None  05/23/2015 2:40 PM Milinda Pointer, MD [facility] None  06/05/2015 8:00 AM [facility]    Primary Care Physician: Kirk Ruths., MD Location: [facility] Note by: Kathlen Brunswick. Dossie Arbour, M.D, DABA, DABAPM, DABPM, DABIPP, FIPP  Pain Score Disclaimer: We use the NRS-11 scale. This is a self-reported, subjective measurement of pain severity with only modest accuracy. It is used primarily to identify changes within a particular patient. It must be understood that outpatient pain scales are significantly less accurate that those used for research, where they can be applied under ideal controlled circumstances with minimal exposure to variables. In reality, the score is likely to be a combination of pain intensity and pain affect, where pain affect describes the degree of emotional arousal or changes in action readiness caused by the sensory experience of pain. Factors such as  social and work situation, setting, emotional state, anxiety levels, expectation, and prior pain experience may influence pain perception and show large inter-individual differences that may also be affected by time variables.

## 2015-04-25 ENCOUNTER — Other Ambulatory Visit
Admission: RE | Admit: 2015-04-25 | Discharge: 2015-04-25 | Disposition: A | Discharge status: Home / Self Care | Payer: 59 | Source: Ambulatory Visit | Attending: Pain Medicine | Admitting: Pain Medicine

## 2015-04-25 DIAGNOSIS — G8929 Other chronic pain: Secondary | ICD-10-CM

## 2015-04-25 LAB — COMPREHENSIVE METABOLIC PANEL
ALK PHOS: 70 U/L (ref 38–126)
ALT: 16 U/L (ref 14–54)
ANION GAP: 6 (ref 5–15)
AST: 25 U/L (ref 15–41)
Albumin: 4.5 g/dL (ref 3.5–5.0)
BILIRUBIN TOTAL: 0.3 mg/dL (ref 0.3–1.2)
BUN: 28 mg/dL — ABNORMAL HIGH (ref 6–20)
CALCIUM: 10.1 mg/dL (ref 8.9–10.3)
CO2: 26 mmol/L (ref 22–32)
CREATININE: 1.15 mg/dL — AB (ref 0.44–1.00)
Chloride: 103 mmol/L (ref 101–111)
GFR calc non Af Amer: 49 mL/min — ABNORMAL LOW (ref >60)
GFR, EST AFRICAN AMERICAN: 57 mL/min — AB (ref >60)
GLUCOSE: 111 mg/dL — AB (ref 65–99)
Potassium: 4.6 mmol/L (ref 3.5–5.1)
Sodium: 135 mmol/L (ref 135–145)
TOTAL PROTEIN: 8.7 g/dL — AB (ref 6.5–8.1)

## 2015-04-25 LAB — MAGNESIUM: Magnesium: 1.9 mg/dL (ref 1.7–2.4)

## 2015-04-25 LAB — SEDIMENTATION RATE: SED RATE: 7 mm/h (ref 0–30)

## 2015-04-25 NOTE — Progress Notes (Signed)
Quick Note:   Normal fasting (NPO x 8 hours) glucose levels are between 65-99 mg/dl, with 2 hour fasting, levels are usually less than 140 mg/dl. Any random blood glucose level greater than 200 mg/dl is considered to be Diabetes.  BUN-to-creatinine ratio >20:1 (BUN dispropertionally higher than the creatinine levels) suggests prerenal azotemia (dehydration or renal hypoperfusion), while <10:1 levels suggest renal damage.  BUN levels between 7 to 20 mg/dL (2.5 to 7.1 mmol/L) are considered normal. Elevated blood urea nitrogen can also be due to: urinary tract obstruction; congestive heart failure or recent heart attack; gastrointestinal bleeding; dehydration; shock; severe burns; certain medications, such as corticosteroids and some antibiotics; and/or a high protein diet.  Normal Creatinine levels are between 0.5 and 0.9 mg/dl for our lab. Any condition that impairs the function of the kidneys is likely to raise the creatinine level in the blood. The most common causes of longstanding (chronic) kidney disease in adults are high blood pressure and diabetes. Other causes of elevated blood creatinine levels include drugs, ingestion of a large amount of dietary meat, kidney infections, rhabdomyolysis (abnormal muscle breakdown), and urinary tract obstruction.  Results of a total protein test are usually considered along with those from other tests of the CMP and will give the healthcare practitioner information on a person's general health status with regard to nutrition and/or conditions involving major organs, such as the kidney and liver. A high total protein level may be seen with chronic inflammation or infections such as viral hepatitis or HIV. It also may be associated with bone marrow disorders such as multiple myeloma. Possible causes of high blood protein include: Bone marrow disorder Multiple myeloma Amyloidosis Monoclonal gammopathy of undetermined significance (MGUS) Chronic inflammatory  conditions HIV/AIDS Dehydration (which may make blood proteins appear falsely elevated)  A high-protein diet doesn't cause high blood protein.  High blood protein is not a specific disease or condition in itself. It's usually a laboratory finding uncovered during the evaluation of a particular condition or symptom. For instance, although high blood protein is found in people who are dehydrated, the real problem is that the blood plasma is actually more concentrated.  Certain proteins in the blood may be elevated as your body fights an infection or some other inflammation. People with certain bone marrow diseases, such as multiple myeloma, may have high blood protein levels before they show any other symptoms. eGFR (Estimated Glomerular Filtration Rate) results are reported as milliliters/minute/1.50m (mL/min/1.757m. Because some laboratories do not collect information on a patient's race when the sample is collected for testing, they may report calculated results for both African Americans and non-African Americans.  The [facility]s only reporting actual results once values are < 60 mL/min. 1. Normal values: 90-120 mL/min 2. Below 60 mL/min suggests that some kidney damage has occurred. 3. Between 591nd 30 indicate (Moderate) Stage 3 kidney disease. 4. Between 29 and 15 represent (Severe) Stage 4 kidney disease. 5. Less than 15 is considered (Kidney Failure) Stage 5. ______

## 2015-04-26 LAB — VITAMIN B12: Vitamin B-12: 178 pg/mL — ABNORMAL LOW (ref 180–914)

## 2015-04-26 LAB — C-REACTIVE PROTEIN: CRP: 0.6 mg/dL (ref <1.0)

## 2015-04-28 ENCOUNTER — Other Ambulatory Visit: Payer: Self-pay | Admitting: Pain Medicine

## 2015-04-28 ENCOUNTER — Encounter: Payer: Self-pay | Admitting: Pain Medicine

## 2015-04-28 DIAGNOSIS — E538 Deficiency of other specified B group vitamins: Secondary | ICD-10-CM

## 2015-04-28 MED ORDER — CYANOCOBALAMIN 1500 MCG PO TBDP: 1.0000 | ORAL_TABLET | Freq: Every day | ORAL | Status: DC

## 2015-04-28 NOTE — Progress Notes (Signed)
Quick Note:   Normal Vitamin B-12 levels are between 180 and 914 pg/mL, for our Lab. Patients with levels below 180 pg/ml are considered to have a Vitamin B-12 "deficiency". Older patients with levels between 200 and 500 may be symptomatic, in which case it is considered an "insufficiency". Symptoms of deficiency or insufficiency may include: tingling and numbness of the digits (fingers & toes), generalized muscle weakness, staggering, irritability, confusion, forgetfulness, tenderness, fatigue, shortness of breath, palpitation, anemia, sporadic episodes of diarrhea, decreased immune system, cognitive impairment, and degeneration of the posterior sensory columns of the spinal cord. Deficiency can lead to anemia and congestive heart failure. Lack of vitamin B12 may lead to peripheral neuropathy. The recommended over-the-counter Vitamin B12 dose intake for deficiency is 125 to 2,000 micrograms of cyanocobalamin taken by mouth, daily.  ______

## 2015-04-29 LAB — VITAMIN D 1,25 DIHYDROXY
VITAMIN D 1, 25 (OH) TOTAL: 55 pg/mL
Vitamin D2 1, 25 (OH)2: <10 pg/mL
Vitamin D3 1, 25 (OH)2: 55 pg/mL

## 2015-04-30 ENCOUNTER — Ambulatory Visit: Payer: 59 | Admitting: Pain Medicine

## 2015-05-01 LAB — TOXASSURE SELECT 13 (MW), URINE: PDF: 0

## 2015-05-08 ENCOUNTER — Ambulatory Visit: Payer: 59 | Attending: Pain Medicine | Admitting: Pain Medicine

## 2015-05-08 ENCOUNTER — Encounter: Payer: Self-pay | Admitting: Pain Medicine

## 2015-05-08 VITALS — BP 128/73 | HR 88 | Temp 95.6°F | Resp 18

## 2015-05-08 DIAGNOSIS — R200 Anesthesia of skin: Secondary | ICD-10-CM | POA: Diagnosis not present

## 2015-05-08 DIAGNOSIS — M8580 Other specified disorders of bone density and structure, unspecified site: Secondary | ICD-10-CM | POA: Diagnosis not present

## 2015-05-08 DIAGNOSIS — I700 Atherosclerosis of aorta: Secondary | ICD-10-CM | POA: Diagnosis not present

## 2015-05-08 DIAGNOSIS — Z79891 Long term (current) use of opiate analgesic: Secondary | ICD-10-CM | POA: Diagnosis not present

## 2015-05-08 DIAGNOSIS — M533 Sacrococcygeal disorders, not elsewhere classified: Secondary | ICD-10-CM | POA: Insufficient documentation

## 2015-05-08 DIAGNOSIS — M7061 Trochanteric bursitis, right hip: Secondary | ICD-10-CM | POA: Insufficient documentation

## 2015-05-08 DIAGNOSIS — E119 Type 2 diabetes mellitus without complications: Secondary | ICD-10-CM | POA: Diagnosis not present

## 2015-05-08 DIAGNOSIS — M5030 Other cervical disc degeneration, unspecified cervical region: Secondary | ICD-10-CM | POA: Insufficient documentation

## 2015-05-08 DIAGNOSIS — M79605 Pain in left leg: Secondary | ICD-10-CM | POA: Insufficient documentation

## 2015-05-08 DIAGNOSIS — G8929 Other chronic pain: Secondary | ICD-10-CM | POA: Diagnosis not present

## 2015-05-08 DIAGNOSIS — N840 Polyp of corpus uteri: Secondary | ICD-10-CM | POA: Diagnosis not present

## 2015-05-08 DIAGNOSIS — M7062 Trochanteric bursitis, left hip: Secondary | ICD-10-CM | POA: Insufficient documentation

## 2015-05-08 DIAGNOSIS — M79604 Pain in right leg: Secondary | ICD-10-CM | POA: Diagnosis not present

## 2015-05-08 DIAGNOSIS — E782 Mixed hyperlipidemia: Secondary | ICD-10-CM | POA: Diagnosis not present

## 2015-05-08 DIAGNOSIS — E538 Deficiency of other specified B group vitamins: Secondary | ICD-10-CM | POA: Insufficient documentation

## 2015-05-08 DIAGNOSIS — M549 Dorsalgia, unspecified: Secondary | ICD-10-CM | POA: Diagnosis present

## 2015-05-08 DIAGNOSIS — M47816 Spondylosis without myelopathy or radiculopathy, lumbar region: Secondary | ICD-10-CM | POA: Insufficient documentation

## 2015-05-08 DIAGNOSIS — M545 Low back pain: Secondary | ICD-10-CM | POA: Diagnosis not present

## 2015-05-08 DIAGNOSIS — E7800 Pure hypercholesterolemia, unspecified: Secondary | ICD-10-CM | POA: Diagnosis not present

## 2015-05-08 DIAGNOSIS — F329 Major depressive disorder, single episode, unspecified: Secondary | ICD-10-CM | POA: Diagnosis not present

## 2015-05-08 DIAGNOSIS — I129 Hypertensive chronic kidney disease with stage 1 through stage 4 chronic kidney disease, or unspecified chronic kidney disease: Secondary | ICD-10-CM | POA: Insufficient documentation

## 2015-05-08 MED ORDER — FENTANYL CITRATE (PF) 100 MCG/2ML IJ SOLN
INTRAMUSCULAR | Status: AC
  Administered 2015-05-08: 100 ug via INTRAVENOUS
  Filled 2015-05-08: qty 2

## 2015-05-08 MED ORDER — ROPIVACAINE HCL 2 MG/ML IJ SOLN
INTRAMUSCULAR | Status: AC
  Administered 2015-05-08: 11:00:00
  Filled 2015-05-08: qty 20

## 2015-05-08 MED ORDER — MIDAZOLAM HCL 5 MG/5ML IJ SOLN
INTRAMUSCULAR | Status: AC
  Administered 2015-05-08: 2 mg via INTRAVENOUS
  Filled 2015-05-08: qty 5

## 2015-05-08 MED ORDER — LIDOCAINE HCL (PF) 1 % IJ SOLN: 10.0000 mL | Freq: Once | INTRAMUSCULAR | Status: DC

## 2015-05-08 MED ORDER — TRIAMCINOLONE ACETONIDE 40 MG/ML IJ SUSP: 40.0000 mg | Freq: Once | INTRAMUSCULAR | Status: DC

## 2015-05-08 MED ORDER — FENTANYL CITRATE (PF) 100 MCG/2ML IJ SOLN: 100.0000 ug | INTRAMUSCULAR | Status: DC

## 2015-05-08 MED ORDER — LACTATED RINGERS IV SOLN: 1000.0000 mL | INTRAVENOUS | Status: AC

## 2015-05-08 MED ORDER — ROPIVACAINE HCL 2 MG/ML IJ SOLN: 9.0000 mL | Freq: Once | INTRAMUSCULAR | Status: DC

## 2015-05-08 MED ORDER — TRIAMCINOLONE ACETONIDE 40 MG/ML IJ SUSP
INTRAMUSCULAR | Status: AC
  Administered 2015-05-08: 11:00:00
  Filled 2015-05-08: qty 2

## 2015-05-08 MED ORDER — MIDAZOLAM HCL 5 MG/5ML IJ SOLN: 5.0000 mg | INTRAMUSCULAR | Status: DC

## 2015-05-08 NOTE — Progress Notes (Signed)
Safety precautions to be maintained throughout the outpatient stay will include: orient to surroundings, keep bed in low position, maintain call bell within reach at all times, provide assistance with transfer out of bed and ambulation.

## 2015-05-08 NOTE — Patient Instructions (Signed)
Pain Management Discharge Instructions  General Discharge Instructions :  If you need to reach your doctor call: Monday-Friday 8:00 am - 4:00 pm at [phone] or toll free [phone].  After clinic hours [phone] to have operator reach doctor.  Bring all of your medication bottles to all your appointments in the pain clinic.  To cancel or reschedule your appointment with Pain Management please remember to call 24 hours in advance to avoid a fee.  Refer to the educational materials which you have been given on: General Risks, I had my Procedure. Discharge Instructions, Post Sedation.  Post Procedure Instructions:  The drugs you were given will stay in your system until tomorrow, so for the next 24 hours you should not drive, make any legal decisions or drink any alcoholic beverages.  You may eat anything you prefer, but it is better to start with liquids then soups and crackers, and gradually work up to solid foods.  Please notify your doctor immediately if you have any unusual bleeding, trouble breathing or pain that is not related to your normal pain.  Depending on the type of procedure that was done, some parts of your body may feel week and/or numb.  This usually clears up by tonight or the next day.  Walk with the use of an assistive device or accompanied by an adult for the 24 hours.  You may use ice on the affected area for the first 24 hours.  Put ice in a Ziploc bag and cover with a towel and place against area 15 minutes on 15 minutes off.  You may switch to heat after 24 hours.GENERAL RISKS AND COMPLICATIONS  What are the risk, side effects and possible complications? Generally speaking, most procedures are safe.  However, with any procedure there are risks, side effects, and the possibility of complications.  The risks and complications are dependent upon the sites that are lesioned, or the type of nerve block to be performed.  The closer the procedure is to the spine,  the more serious the risks are.  Great care is taken when placing the radio frequency needles, block needles or lesioning probes, but sometimes complications can occur. 1. Infection: Any time there is an injection through the skin, there is a risk of infection.  This is why sterile conditions are used for these blocks.  There are four possible types of infection. 1. Localized skin infection. 2. Central Nervous System Infection-This can be in the form of Meningitis, which can be deadly. 3. Epidural Infections-This can be in the form of an epidural abscess, which can cause pressure inside of the spine, causing compression of the spinal cord with subsequent paralysis. This would require an emergency surgery to decompress, and there are no guarantees that the patient would recover from the paralysis. 4. Discitis-This is an infection of the intervertebral discs.  It occurs in about 1% of discography procedures.  It is difficult to treat and it may lead to surgery.        2. Pain: the needles have to go through skin and soft tissues, will cause soreness.       3. Damage to internal structures:  The nerves to be lesioned may be near blood vessels or    other nerves which can be potentially damaged.       4. Bleeding: Bleeding is more common if the patient is taking blood thinners such as  aspirin, Coumadin, Ticiid, Plavix, etc., or if he/she have some genetic predisposition  such as  hemophilia. Bleeding into the spinal canal can cause compression of the spinal  cord with subsequent paralysis.  This would require an emergency surgery to  decompress and there are no guarantees that the patient would recover from the  paralysis.       5. Pneumothorax:  Puncturing of a lung is a possibility, every time a needle is introduced in  the area of the chest or upper back.  Pneumothorax refers to free air around the  collapsed lung(s), inside of the thoracic cavity (chest cavity).  Another two possible  complications  related to a similar event would include: Hemothorax and Chylothorax.   These are variations of the Pneumothorax, where instead of air around the collapsed  lung(s), you may have blood or chyle, respectively.       6. Spinal headaches: They may occur with any procedures in the area of the spine.       7. Persistent CSF (Cerebro-Spinal Fluid) leakage: This is a rare problem, but may occur  with prolonged intrathecal or epidural catheters either due to the formation of a fistulous  track or a dural tear.       8. Nerve damage: By working so close to the spinal cord, there is always a possibility of  nerve damage, which could be as serious as a permanent spinal cord injury with  paralysis.       9. Death:  Although rare, severe deadly allergic reactions known as "Anaphylactic  reaction" can occur to any of the medications used.      10. Worsening of the symptoms:  We can always make thing worse.  What are the chances of something like this happening? Chances of any of this occuring are extremely low.  By statistics, you have more of a chance of getting killed in a motor vehicle accident: while driving to the hospital than any of the above occurring .  Nevertheless, you should be aware that they are possibilities.  In general, it is similar to taking a shower.  Everybody knows that you can slip, hit your head and get killed.  Does that mean that you should not shower again?  Nevertheless always keep in mind that statistics do not mean anything if you happen to be on the wrong side of them.  Even if a procedure has a 1 (one) in a 1,000,000 (million) chance of going wrong, it you happen to be that one..Also, keep in mind that by statistics, you have more of a chance of having something go wrong when taking medications.  Who should not have this procedure? If you are on a blood thinning medication (e.g. Coumadin, Plavix, see list of "Blood Thinners"), or if you have an active infection going on, you should not  have the procedure.  If you are taking any blood thinners, please inform your physician.  How should I prepare for this procedure?  Do not eat or drink anything at least six hours prior to the procedure.  Bring a driver with you .  It cannot be a taxi.  Come accompanied by an adult that can drive you back, and that is strong enough to help you if your legs get weak or numb from the local anesthetic.  Take all of your medicines the morning of the procedure with just enough water to swallow them.  If you have diabetes, make sure that you are scheduled to have your procedure done first thing in the morning, whenever possible.  If you have diabetes,  take only half of your insulin dose and notify our nurse that you have done so as soon as you arrive at the clinic.  If you are diabetic, but only take blood sugar pills (oral hypoglycemic), then do not take them on the morning of your procedure.  You may take them after you have had the procedure.  Do not take aspirin or any aspirin-containing medications, at least eleven (11) days prior to the procedure.  They may prolong bleeding.  Wear loose fitting clothing that may be easy to take off and that you would not mind if it got stained with Betadine or blood.  Do not wear any jewelry or perfume  Remove any nail coloring.  It will interfere with some of our monitoring equipment.  NOTE: Remember that this is not meant to be interpreted as a complete list of all possible complications.  Unforeseen problems may occur.  BLOOD THINNERS The following drugs contain aspirin or other products, which can cause increased bleeding during surgery and should not be taken for 2 weeks prior to and 1 week after surgery.  If you should need take something for relief of minor pain, you may take acetaminophen which is found in Tylenol,m Datril, Anacin-3 and Panadol. It is not blood thinner. The products listed below are.  Do not take any of the products listed below  in addition to any listed on your instruction sheet.  A.P.C or A.P.C with Codeine Codeine Phosphate Capsules #3 Ibuprofen Ridaura  ABC compound Congesprin Imuran rimadil  Advil Cope Indocin Robaxisal  Alka-Seltzer Effervescent Pain Reliever and Antacid Coricidin or Coricidin-D  Indomethacin Rufen  Alka-Seltzer plus Cold Medicine Cosprin Ketoprofen S-A-C Tablets  Anacin Analgesic Tablets or Capsules Coumadin Korlgesic Salflex  Anacin Extra Strength Analgesic tablets or capsules CP-2 Tablets Lanoril Salicylate  Anaprox Cuprimine Capsules Levenox Salocol  Anexsia-D Dalteparin Magan Salsalate  Anodynos Darvon compound Magnesium Salicylate Sine-off  Ansaid Dasin Capsules Magsal Sodium Salicylate  Anturane Depen Capsules Marnal Soma  APF Arthritis pain formula Dewitt's Pills Measurin Stanback  Argesic Dia-Gesic Meclofenamic Sulfinpyrazone  Arthritis Bayer Timed Release Aspirin Diclofenac Meclomen Sulindac  Arthritis pain formula Anacin Dicumarol Medipren Supac  Analgesic (Safety coated) Arthralgen Diffunasal Mefanamic Suprofen  Arthritis Strength Bufferin Dihydrocodeine Mepro Compound Suprol  Arthropan liquid Dopirydamole Methcarbomol with Aspirin Synalgos  ASA tablets/Enseals Disalcid Micrainin Tagament  Ascriptin Doan's Midol Talwin  Ascriptin A/D Dolene Mobidin Tanderil  Ascriptin Extra Strength Dolobid Moblgesic Ticlid  Ascriptin with Codeine Doloprin or Doloprin with Codeine Momentum Tolectin  Asperbuf Duoprin Mono-gesic Trendar  Aspergum Duradyne Motrin or Motrin IB Triminicin  Aspirin plain, buffered or enteric coated Durasal Myochrisine Trigesic  Aspirin Suppositories Easprin Nalfon Trillsate  Aspirin with Codeine Ecotrin Regular or Extra Strength Naprosyn Uracel  Atromid-S Efficin Naproxen Ursinus  Auranofin Capsules Elmiron Neocylate Vanquish  Axotal Emagrin Norgesic Verin  Azathioprine Empirin or Empirin with Codeine Normiflo Vitamin E  Azolid Emprazil Nuprin Voltaren  Bayer  Aspirin plain, buffered or children's or timed BC Tablets or powders Encaprin Orgaran Warfarin Sodium  Buff-a-Comp Enoxaparin Orudis Zorpin  Buff-a-Comp with Codeine Equegesic Os-Cal-Gesic   Buffaprin Excedrin plain, buffered or Extra Strength Oxalid   Bufferin Arthritis Strength Feldene Oxphenbutazone   Bufferin plain or Extra Strength Feldene Capsules Oxycodone with Aspirin   Bufferin with Codeine Fenoprofen Fenoprofen Pabalate or Pabalate-SF   Buffets II Flogesic Panagesic   Buffinol plain or Extra Strength Florinal or Florinal with Codeine Panwarfarin   Buf-Tabs Flurbiprofen Penicillamine   Butalbital Compound Four-way cold tablets  Penicillin   Butazolidin Fragmin Pepto-Bismol   Carbenicillin Geminisyn Percodan   Carna Arthritis Reliever Geopen Persantine   Carprofen Gold's salt Persistin   Chloramphenicol Goody's Phenylbutazone   Chloromycetin Haltrain Piroxlcam   Clmetidine heparin Plaquenil   Cllnoril Hyco-pap Ponstel   Clofibrate Hydroxy chloroquine Propoxyphen         Before stopping any of these medications, be sure to consult the physician who ordered them.  Some, such as Coumadin (Warfarin) are ordered to prevent or treat serious conditions such as "deep thrombosis", "pumonary embolisms", and other heart problems.  The amount of time that you may need off of the medication may also vary with the medication and the reason for which you were taking it.  If you are taking any of these medications, please make sure you notify your pain physician before you undergo any procedures.         Facet Joint Block, Care After Refer to this sheet in the next few weeks. These instructions provide you with information on caring for yourself after your procedure. Your health care provider may also give you more specific instructions. Your treatment has been planned according to current medical practices, but problems sometimes occur. Call your health care provider if you have any problems  or questions after your procedure. HOME CARE INSTRUCTIONS  2. Keep track of the amount of pain relief you feel and how long it lasts. 3. Limit pain medicine within the first 4-6 hours after the procedure as directed by your health care provider. 4. Resume taking dietary supplements and medicines as directed by your health care provider. 5. You may resume your regular diet. 6. Do not apply heat near or over the injection site(s) for 24 hours.  7. Do not take a bath or soak in water (such as a pool or lake) for 24 hours. 8. Do not drive for 24 hours unless approved by your health care provider. 9. Avoid strenuous activity for 24 hours. 10. Remove your bandages the morning after the procedure.  11. If the injection site is tender, applying an ice pack may relieve some tenderness. To do this: 1. Put ice in a bag. 2. Place a towel between your skin and the bag. 3. Leave the ice on for 15-20 minutes, 3-4 times a day. 12. Keep follow-up appointments as directed by your health care provider. SEEK MEDICAL CARE IF:   Your pain is not controlled by your medicines.   There is drainage from the injection site.   There is significant bleeding or swelling at the injection site.  You have diabetes and your blood sugar is above 180 mg/dL. SEEK IMMEDIATE MEDICAL CARE IF:   You develop a fever of 101F (38.3C) or greater.   You have worsening pain or swelling around the injection site.   You have red streaking around the injection site.   You develop severe pain that is not controlled by your medicines.   You develop a headache, stiff neck, nausea, or vomiting.   Your eyes become very sensitive to light.   You have weakness, paralysis, or tingling in your arms or legs that was not present before the procedure.   You develop difficulty urinating or breathing.    This information is not intended to replace advice given to you by your health care provider. Make sure you discuss any  questions you have with your health care provider.   Document Released: 12/24/2011 Document Revised: 01/27/2014 Document Reviewed: 12/24/2011 Elsevier Interactive Patient Education 2016  [employer] Facet Joint Block The facet joints connect the bones of the spine (vertebrae). They make it possible for you to bend, twist, and make other movements with your spine. They also prevent you from overbending, overtwisting, and making other excessive movements.  A facet joint block is a procedure where a numbing medicine (anesthetic) is injected into a facet joint. Often, a type of anti-inflammatory medicine called a steroid is also injected. A facet joint block may be done for two reasons:  13. Diagnosis. A facet joint block may be done as a test to see whether neck or back pain is caused by a worn-down or infected facet joint. If the pain gets better after a facet joint block, it means the pain is probably coming from the facet joint. If the pain does not get better, it means the pain is probably not coming from the facet joint.  14. Therapy. A facet joint block may be done to relieve neck or back pain caused by a facet joint. A facet joint block is only done as a therapy if the pain does not improve with medicine, exercise programs, physical therapy, and other forms of pain management. LET [facility] CARE PROVIDER KNOW ABOUT:   Any allergies you have.   All medicines you are taking, including vitamins, herbs, eyedrops, and over-the-counter medicines and creams.   Previous problems you or members of your family have had with the use of anesthetics.   Any blood disorders you have had.   Other health problems you have. RISKS AND COMPLICATIONS Generally, having a facet joint block is safe. However, as with any procedure, complications can occur. Possible complications associated with having a facet joint block include:   Bleeding.   Injury to a nerve near the injection site.   Pain at the  injection site.   Weakness or numbness in areas controlled by nerves near the injection site.   Infection.   Temporary fluid retention.   Allergic reaction to anesthetics or medicines used during the procedure. BEFORE THE PROCEDURE   Follow your health care provider's instructions if you are taking dietary supplements or medicines. You may need to stop taking them or reduce your dosage.   Do not take any new dietary supplements or medicines without asking your health care provider first.   Follow your health care provider's instructions about eating and drinking before the procedure. You may need to stop eating and drinking several hours before the procedure.   Arrange to have an adult drive you home after the procedure. PROCEDURE 12. You may need to remove your clothing and dress in an open-back gown so that your health care provider can access your spine.  13. The procedure will be done while you are lying on an X-ray table. Most of the time you will be asked to lie on your stomach, but you may be asked to lie in a different position if an injection will be made in your neck.  14. Special machines will be used to monitor your oxygen levels, heart rate, and blood pressure.  15. If an injection will be made in your neck, an intravenous (IV) tube will be inserted into one of your veins. Fluids and medicine will flow directly into your body through the IV tube.  16. The area over the facet joint where the injection will be made will be cleaned with an antiseptic soap. The surrounding skin will be covered with sterile drapes.  17. An anesthetic will be applied to  your skin to make the injection area numb. You may feel a temporary stinging or burning sensation.  18. A video X-ray machine will be used to locate the joint. A contrast dye may be injected into the facet joint area to help with locating the joint.  19. When the joint is located, an anesthetic medicine will be injected  into the joint through the needle.  44. Your health care provider will ask you whether you feel pain relief. If you do feel relief, a steroid may be injected to provide pain relief for a longer period of time. If you do not feel relief or feel only partial relief, additional injections of an anesthetic may be made in other facet joints.  21. The needle will be removed, the skin will be cleansed, and bandages will be applied.  AFTER THE PROCEDURE   You will be observed for 15-30 minutes before being allowed to go home. Do not drive. Have an adult drive you or take a taxi or public transportation instead.   If you feel pain relief, the pain will return in several hours or days when the anesthetic wears off.   You may feel pain relief 2-14 days after the procedure. The amount of time this relief lasts varies from person to person.   It is normal to feel some tenderness over the injected area(s) for 2 days following the procedure.   If you have diabetes, you may have a temporary increase in blood sugar.   This information is not intended to replace advice given to you by your health care provider. Make sure you discuss any questions you have with your health care provider.   Document Released: 05/28/2006 Document Revised: 01/27/2014 Document Reviewed: 10/27/2011 Elsevier Interactive Patient Education [employer].

## 2015-05-08 NOTE — Progress Notes (Signed)
Patient's Name: Stephanie Short  Patient type: Established  MRN: [id number]  Service setting: Ambulatory outpatient  DOB: 29-May-1950  Location: [facility]  DOS: 05/08/2015  Primary Care Physician: Kirk Ruths., MD  Note by: Kathlen Brunswick. Dossie Arbour, M.D, DABA, DABAPM, DABPM, DABIPP, [place]  Referring Physician: Kirk Ruths, MD  Specialty: Board-Certified Interventional Pain Management     Primary Reason(s) for Visit: Interventional Pain Management Treatment. CC: Back Pain  Procedure:  Anesthesia, Analgesia, Anxiolysis:  Type: Diagnostic Medial Branch Facet Block Region: Lumbar Level: L2, L3, L4, L5, & S1 Medial Branch Level(s) Laterality: Bilateral  Indications: 1. Lumbar facet syndrome (Bilateral) (R>L)   2. Lumbar spondylosis, unspecified spinal osteoarthritis   3. Chronic low back pain (Location of Primary Source of Pain) (Bilateral) (R>L)     Pre-procedure Pain Score: 7/10 Reported level of pain is compatible with clinical observations Post-procedure Pain Score: 5   Type: Moderate (Conscious) Sedation & Local Anesthesia Local Anesthetic: Lidocaine 1% Route: Intravenous (IV) IV Access: Secured Sedation: Meaningful verbal contact was maintained at all times during the procedure  Indication(s): Analgesia & Anxiolysis   Pre-Procedure Assessment:  Stephanie Short is a 65 y.o. year old, female patient, seen today for interventional treatment. She has HYPERLIPIDEMIA-MIXED; AORTIC ATHEROSCLEROSIS; Endometrial polyp; Chronic pain; Chronic low back pain (Location of Primary Source of Pain) (Bilateral) (R>L); Lumbar spondylosis; Failed back surgical syndrome; Lumbar facet syndrome (Bilateral) (R>L); Long term current use of opiate analgesic; Long term prescription opiate use; Opiate use (22.5 MME/Day); Encounter for therapeutic drug level monitoring; Encounter for chronic pain management; Airway hyperreactivity; Atherosclerosis of abdominal aorta ([place]);  Cervical nerve root disorder; Diabetes mellitus ([place]); Benign essential HTN; Cannot sleep; Major depression in remission ([place]); Burning or prickling sensation; Hemorrhage, postmenopausal; Pure hypercholesterolemia; Osteopenia; Chronic hip pain (Bilateral) (L>R); Greater trochanteric bursitis of hips (Bilateral); Chronic sacroiliac joint pain (Bilateral); Osteoarthritis of hips (Bilateral); Opioid dependence, uncomplicated ([place]); Pain management; Chronic pain of lower extremity  (Bilateral) (L>R); Postmenopausal osteoporosis; Chronic cervical radicular pain; Neurogenic pain; Chronic kidney disease; and Vitamin B12 deficiency on her problem list.. Her primarily concern today is the Back Pain   Pain Type: Chronic pain Pain Location: Back Pain Orientation: Lower Pain Descriptors / Indicators: Dull Pain Frequency: Intermittent  Date of Last Visit: 04/24/15 Service Provided on Last Visit: Med Refill  Verification of the correct person, correct site (including marking of site), and correct procedure were performed and confirmed by the patient.  Today's Vitals   05/08/15 1148 05/08/15 1158 05/08/15 1208 05/08/15 1218  BP: 138/84 137/74 127/69 128/73  Pulse: 90 96 94 88  Temp: 95.6 F (35.3 C)     TempSrc:      Resp: _0 SpO2: 92% 95% 95% 95%  PainSc:    5   Calculated BMI: There is no weight on file to calculate BMI. Allergies: She is allergic to codeine; dilaudid; percocet; tramadol; gabapentin; hydrocodone-acetaminophen; hydromorphone; promethazine; and scallops.. Primary Diagnosis: Facet syndrome, lumbar [M54.5]  Consent: Secured. Under the influence of no sedatives a written informed consent was obtained, after having provided information on the risks and possible complications. To fulfill our ethical and legal obligations, as recommended by the American Medical Association's Code of Ethics, we have provided information to the patient about our clinical impression; the nature and  purpose of the treatment or procedure; the risks, benefits, and possible complications of the intervention; alternatives; the risk(s) and benefit(s) of the alternative treatment(s) or procedure(s); and the risk(s) and  benefit(s) of doing nothing. The patient was provided information about the risks and possible complications associated with the procedure. In the case of spinal procedures these may include, but are not limited to, failure to achieve desired goals, infection, bleeding, organ or nerve damage, allergic reactions, paralysis, and death. In addition, the patient was informed that Medicine is not an exact science; therefore, there is also the possibility of unforeseen risks and possible complications that may result in a catastrophic outcome. The patient indicated having understood very clearly. We have given the patient no guarantees and we have made no promises. Enough time was given to the patient to ask questions, all of which were answered to the patient's satisfaction.  Pre-Procedure Preparation: Safety Precautions: Allergies reviewed. Appropriate site, procedure, and patient were confirmed by following the Joint Commission's Universal Protocol (UP.01.01.01), in the form of a "Time Out". The patient was asked to confirm marked site and procedure, before commencing. The patient was asked about blood thinners, or active infections, both of which were denied. Patient was assessed for positional comfort and all pressure points were checked before starting procedure. Monitoring:  As per clinic protocol. Infection Control Precautions: Sterile technique used. Standard Universal Precautions were taken as recommended by the Department of [facility] for Disease Control and Prevention (CDC). Standard pre-surgical skin prep was conducted. Respiratory hygiene and cough etiquette was practiced. Hand hygiene observed. Safe injection practices and needle disposal techniques followed. SDV (single dose  vial) medications used. Medications properly checked for expiration dates and contaminants. Personal protective equipment (PPE) used: Sterile double glove technique. Radiation resistant gloves. Sterile surgical gloves.  Description of Procedure Process:   Time-out: "Time-out" completed before starting procedure, as per protocol. Position: Prone Target Area: For Lumbar Facet blocks, the target is the groove formed by the junction of the transverse process and superior articular process. For the L5 dorsal ramus, the target is the notch between superior articular process and sacral ala. For the S1 dorsal ramus, the target is the superior and lateral edge of the posterior S1 Sacral foramen. Approach: Paramedial approach. Area Prepped: Entire Posterior Lumbosacral Region Prepping solution: ChloraPrep (2% chlorhexidine gluconate and 70% isopropyl alcohol) Safety Precautions: Aspiration looking for blood return was conducted prior to all injections. At no point did we inject any substances, as a needle was being advanced. No attempts were made at seeking any paresthesias. Safe injection practices and needle disposal techniques used. Medications properly checked for expiration dates. SDV (single dose vial) medications used.   Description of the Procedure: Protocol guidelines were followed. The patient was placed in position over the fluoroscopy table. The target area was identified and the area prepped in the usual manner. Skin desensitized using vapocoolant spray. Skin & deeper tissues infiltrated with local anesthetic. Appropriate amount of time allowed to pass for local anesthetics to take effect. The procedure needle was introduced through the skin, ipsilateral to the reported pain, and advanced to the target area. Employing the "Medial Branch Technique", the needles were advanced to the angle made by the superior and medial portion of the transverse process, and the lateral and inferior portion of the  superior articulating process of the targeted vertebral bodies. This area is known as "Burton's Eye" or the "Eye of the [place] Dog". A procedure needle was introduced through the skin, and this time advanced to the angle made by the superior and medial border of the sacral ala, and the lateral border of the S1 vertebral body. This last needle was later  repositioned at the superior and lateral border of the posterior S1 foramen. Negative aspiration confirmed. Solution injected in intermittent fashion, asking for systemic symptoms every 0.5cc of injectate. The needles were then removed and the area cleansed, making sure to leave some of the prepping solution back to take advantage of its long term bactericidal properties. EBL: None Materials & Medications Used:  Needle(s) Used: 22g - 3.5" Spinal Needle(s) Medications Administered today: We administered ropivacaine (PF) 2 mg/ml (0.2%), fentaNYL, triamcinolone acetonide, and midazolam.Please see chart orders for dosing details.  Imaging Guidance:   Type of Imaging Technique: Fluoroscopy Guidance (Spinal) Indication(s): Assistance in needle guidance and placement for procedures requiring needle placement in or near specific anatomical locations not easily accessible without such assistance. Exposure Time: Please see nurses notes. Contrast: None required. Fluoroscopic Guidance: I was personally present in the fluoroscopy suite, where the patient was placed in position for the procedure, over the fluoroscopy-compatible table. Fluoroscopy was manipulated, using "Tunnel Vision Technique", to obtain the best possible view of the target area, on the affected side. Parallax error was corrected before commencing the procedure. A "direction-depth-direction" technique was used to introduce the needle under continuous pulsed fluoroscopic guidance. Once the target was reached, antero-posterior, oblique, and lateral fluoroscopic projection views were taken to confirm  needle placement in all planes. Permanently recorded images stored by scanning into EMR. Interpretation: Intraoperative imaging interpretation by performing Physician. Adequate needle placement confirmed. Adequate needle placement confirmed in AP, lateral, & Oblique Views. No contrast injected.  Antibiotic Prophylaxis:  Indication(s): No indications identified. Type:  Antibiotics Given (last 72 hours)    None       Post-operative Assessment:   Complications: No immediate post-treatment complications were observed. Disposition: Return to clinic for follow-up evaluation. The patient tolerated the entire procedure well. A repeat set of vitals were taken after the procedure and the patient was kept under observation following institutional policy, for this procedure. Post-procedural neurological assessment was performed, showing return to baseline, prior to discharge. The patient was discharged home, once institutional criteria were met. The patient was provided with post-procedure discharge instructions, including a section on how to identify potential problems. Should any problems arise concerning this procedure, the patient was given instructions to immediately contact us, at any time, without hesitation. In any case, we plan to contact the patient by telephone for a follow-up status report regarding this interventional procedure. Comments:  No additional relevant information.  Medications administered during this visit: We administered ropivacaine (PF) 2 mg/ml (0.2%), fentaNYL, triamcinolone acetonide, and midazolam.  Prescriptions ordered during this visit: New Prescriptions   No medications on file    Future Appointments Date Time Provider [place]  05/23/2015 2:40 PM Milinda Pointer, MD [facility] None  06/05/2015 8:00 AM [facility]    Primary Care Physician: Kirk Ruths., MD Location: [facility] Note by: Kathlen Brunswick.  Dossie Arbour, M.D, DABA, DABAPM, DABPM, DABIPP, FIPP   Illustration of the posterior view of the lumbar spine and the posterior neural structures. Laminae of L2 through S1 are labeled. DPRL5, dorsal primary ramus of L5; DPRS1, dorsal primary ramus of S1; DPR3, dorsal primary ramus of L3; FJ, facet (zygapophyseal) joint L3-L4; I, inferior articular process of L4; LB1, lateral branch of dorsal primary ramus of L1; IAB, inferior articular branches from L3 medial branch (supplies L4-L5 facet joint); IBP, intermediate branch plexus; MB3, medial branch of dorsal primary ramus of L3; NR3, third lumbar nerve root; S, superior articular process of L5; SAB, superior  articular branches from L4 (supplies L4-5 facet joint also); TP3, transverse process of L3.  Disclaimer:  Medicine is not an Chief Strategy Officer. The only guarantee in medicine is that nothing is guaranteed. It is important to note that the decision to proceed with this intervention was based on the information collected from the patient. The Data and conclusions were drawn from the patient's questionnaire, the interview, and the physical examination. Because the information was provided in large part by the patient, it cannot be guaranteed that it has not been purposely or unconsciously manipulated. Every effort has been made to obtain as much relevant data as possible for this evaluation. It is important to note that the conclusions that lead to this procedure are derived in large part from the available data. Always take into account that the treatment will also be dependent on availability of resources and existing treatment guidelines, considered by other Pain Management Practitioners as being common knowledge and practice, at the time of the intervention. For Medico-Legal purposes, it is also important to point out that variation in procedural techniques and pharmacological choices are the acceptable norm. The indications, contraindications, technique, and  results of the above procedure should only be interpreted and judged by a Board-Certified Interventional Pain Specialist with extensive familiarity and expertise in the same exact procedure and technique. Attempts at providing opinions without similar or greater experience and expertise than that of the treating physician will be considered as inappropriate and unethical, and shall result in a formal complaint to the state medical board and applicable specialty societies.

## 2015-05-09 ENCOUNTER — Telehealth: Payer: Self-pay | Admitting: *Deleted

## 2015-05-09 NOTE — Telephone Encounter (Signed)
Spoke with patient re; procedure on yesterday.  Verbalizes that she had difficulty with nausea in recovery and during the afternoon and evening following her procedure.  States she is better now and was able to keep light meals down last evening.  Also asked soreness at the site, which I explained was normal and would subside and recommended ice to the area.  No other concerns verbalized.  Patient will discuss with Dr Dossie Arbour at evaluation appt.

## 2015-05-23 ENCOUNTER — Ambulatory Visit: Payer: 59 | Attending: Pain Medicine | Admitting: Pain Medicine

## 2015-05-23 ENCOUNTER — Encounter: Payer: Self-pay | Admitting: Pain Medicine

## 2015-05-23 VITALS — BP 146/97 | HR 97 | Temp 98.1°F | Resp 16 | Ht 64.0 in | Wt 180.0 lb

## 2015-05-23 DIAGNOSIS — F329 Major depressive disorder, single episode, unspecified: Secondary | ICD-10-CM | POA: Insufficient documentation

## 2015-05-23 DIAGNOSIS — R200 Anesthesia of skin: Secondary | ICD-10-CM | POA: Diagnosis not present

## 2015-05-23 DIAGNOSIS — M25559 Pain in unspecified hip: Secondary | ICD-10-CM | POA: Diagnosis not present

## 2015-05-23 DIAGNOSIS — G8929 Other chronic pain: Secondary | ICD-10-CM | POA: Diagnosis not present

## 2015-05-23 DIAGNOSIS — Z5181 Encounter for therapeutic drug level monitoring: Secondary | ICD-10-CM | POA: Diagnosis not present

## 2015-05-23 DIAGNOSIS — Z87891 Personal history of nicotine dependence: Secondary | ICD-10-CM | POA: Diagnosis not present

## 2015-05-23 DIAGNOSIS — E119 Type 2 diabetes mellitus without complications: Secondary | ICD-10-CM | POA: Insufficient documentation

## 2015-05-23 DIAGNOSIS — M25551 Pain in right hip: Secondary | ICD-10-CM | POA: Diagnosis not present

## 2015-05-23 DIAGNOSIS — I129 Hypertensive chronic kidney disease with stage 1 through stage 4 chronic kidney disease, or unspecified chronic kidney disease: Secondary | ICD-10-CM | POA: Insufficient documentation

## 2015-05-23 DIAGNOSIS — M533 Sacrococcygeal disorders, not elsewhere classified: Secondary | ICD-10-CM | POA: Diagnosis not present

## 2015-05-23 DIAGNOSIS — M549 Dorsalgia, unspecified: Secondary | ICD-10-CM | POA: Diagnosis present

## 2015-05-23 DIAGNOSIS — M25552 Pain in left hip: Secondary | ICD-10-CM | POA: Diagnosis not present

## 2015-05-23 DIAGNOSIS — N950 Postmenopausal bleeding: Secondary | ICD-10-CM | POA: Diagnosis not present

## 2015-05-23 DIAGNOSIS — M545 Low back pain: Secondary | ICD-10-CM | POA: Insufficient documentation

## 2015-05-23 DIAGNOSIS — M47816 Spondylosis without myelopathy or radiculopathy, lumbar region: Secondary | ICD-10-CM

## 2015-05-23 DIAGNOSIS — E7800 Pure hypercholesterolemia, unspecified: Secondary | ICD-10-CM | POA: Diagnosis not present

## 2015-05-23 DIAGNOSIS — M8580 Other specified disorders of bone density and structure, unspecified site: Secondary | ICD-10-CM | POA: Insufficient documentation

## 2015-05-23 DIAGNOSIS — E782 Mixed hyperlipidemia: Secondary | ICD-10-CM | POA: Insufficient documentation

## 2015-05-23 DIAGNOSIS — B1920 Unspecified viral hepatitis C without hepatic coma: Secondary | ICD-10-CM | POA: Insufficient documentation

## 2015-05-23 DIAGNOSIS — M810 Age-related osteoporosis without current pathological fracture: Secondary | ICD-10-CM | POA: Insufficient documentation

## 2015-05-23 DIAGNOSIS — E538 Deficiency of other specified B group vitamins: Secondary | ICD-10-CM | POA: Diagnosis not present

## 2015-05-23 DIAGNOSIS — R52 Pain, unspecified: Secondary | ICD-10-CM

## 2015-05-23 DIAGNOSIS — I700 Atherosclerosis of aorta: Secondary | ICD-10-CM | POA: Insufficient documentation

## 2015-05-23 DIAGNOSIS — Z789 Other specified health status: Secondary | ICD-10-CM | POA: Insufficient documentation

## 2015-05-23 DIAGNOSIS — F1190 Opioid use, unspecified, uncomplicated: Secondary | ICD-10-CM

## 2015-05-23 DIAGNOSIS — G4700 Insomnia, unspecified: Secondary | ICD-10-CM | POA: Diagnosis not present

## 2015-05-23 DIAGNOSIS — Z9119 Patient's noncompliance with other medical treatment and regimen: Secondary | ICD-10-CM

## 2015-05-23 DIAGNOSIS — Z79891 Long term (current) use of opiate analgesic: Secondary | ICD-10-CM | POA: Diagnosis not present

## 2015-05-23 DIAGNOSIS — N840 Polyp of corpus uteri: Secondary | ICD-10-CM | POA: Insufficient documentation

## 2015-05-23 DIAGNOSIS — M7061 Trochanteric bursitis, right hip: Secondary | ICD-10-CM | POA: Insufficient documentation

## 2015-05-23 DIAGNOSIS — M7062 Trochanteric bursitis, left hip: Secondary | ICD-10-CM | POA: Diagnosis not present

## 2015-05-23 DIAGNOSIS — Z5189 Encounter for other specified aftercare: Secondary | ICD-10-CM

## 2015-05-23 DIAGNOSIS — Z91199 Patient's noncompliance with other medical treatment and regimen due to unspecified reason: Secondary | ICD-10-CM

## 2015-05-23 MED ORDER — OXYCODONE HCL 5 MG PO TABS: 2.5000 mg | ORAL_TABLET | ORAL | Status: DC | PRN

## 2015-05-23 MED ORDER — CYANOCOBALAMIN 1500 MCG PO TBDP: 1.0000 | ORAL_TABLET | Freq: Every day | ORAL | Status: DC

## 2015-05-23 MED ORDER — ONDANSETRON 8 MG PO TBDP
8.0000 mg | ORAL_TABLET | Freq: Three times a day (TID) | ORAL | Status: DC | PRN
Start: 2015-05-23 — End: 2015-10-17

## 2015-05-23 NOTE — Progress Notes (Signed)
Patient's Name: Stephanie Short  Patient type: Established  MRN: [id number]  Service setting: Ambulatory outpatient  DOB: 04/01/1950  Location: [facility]  DOS: 05/23/2015  Primary Care Physician: Kirk Ruths., MD  Note by: Kathlen Brunswick. Dossie Arbour, M.D, DABA, [place], DABPM, Milagros Evener, FIPP  Referring Physician: Kirk Ruths, MD  Specialty: Board-Certified Interventional Pain Management  Last Visit to Pain Management: 05/09/2015   Primary Reason(s) for Visit: Encounter for prescription drug management (Level of risk: moderate) CC: Back Pain   HPI  Stephanie Short is a 65 y.o. year old, female patient, who returns today as an established patient. She has HYPERLIPIDEMIA-MIXED; AORTIC ATHEROSCLEROSIS; Endometrial polyp; Chronic pain; Chronic low back pain (Location of Primary Source of Pain) (Bilateral) (R>L); Lumbar spondylosis; Failed back surgical syndrome; Lumbar facet syndrome (Bilateral) (R>L); Long term current use of opiate analgesic; Long term prescription opiate use; Opiate use (22.5 MME/Day); Encounter for therapeutic drug level monitoring; Encounter for chronic pain management; Airway hyperreactivity; Atherosclerosis of abdominal aorta ([place]); Cervical nerve root disorder; Diabetes mellitus ([place]); Benign essential HTN; Cannot sleep; Major depression in remission ([place]); Burning or prickling sensation; Hemorrhage, postmenopausal; Pure hypercholesterolemia; Osteopenia; Chronic hip pain (Bilateral) (L>R); Greater trochanteric bursitis of hips (Bilateral); Chronic sacroiliac joint pain (Bilateral); Osteoarthritis of hips (Bilateral); Pain management; Chronic pain of lower extremity  (Bilateral) (L>R); Postmenopausal osteoporosis; Chronic cervical radicular pain; Neurogenic pain; Chronic kidney disease; Vitamin B12 deficiency; Problem with medical care compliance; and Poor historian on her problem list.. Her primarily concern today is the Back Pain   Pain  Assessment: Self-Reported Pain Score: 6 , clinically she looks like a 2/10. Reported level is inconsistent with clinical obrservations Pain Type: Chronic pain Pain Location: Back Pain Orientation: Lower Pain Descriptors / Indicators: Aching, Constant, Dull, Radiating, Sharp (dull ache in hips,and at times has sharp pain on the right  side of back  and some times on the left lower back it is a sharp pain) Pain Frequency: Constant  The patient comes into the clinics today for pharmacological management of her chronic pain. I last saw this patient on 05/08/2015. Her body mass index is 30.88 kg/(m^2). The patient  reports that she does not use illicit drugs. The patient returns after having had a diagnostic bilateral lumbar facet block under fluoroscopic guidance and IV sedation #2. Just as in the case of the first diagnostic injection, she does not seem to be pain attention to my instructions and did not follow up with any good data. She did not bring her medications to be counted and we also did not have her postprocedure pain diary.  Date of Last Visit: 05/08/15 Service Provided on Last Visit: Procedure  Controlled Substance Pharmacotherapy Assessment & REMS (Risk Evaluation and Mitigation Strategy)  Analgesic: Oxycodone IR 5 mg, half of a tablet every 4 hours with a maximum of 3 tablets per day (15 mg/day of oxycodone) Pill Count: The patient did not bring her medications to her appointment today. She was given a final warning with regards to this. MME/day: 22.5 mg/day Pharmacokinetics: Onset of action (Liberation/Absorption): Within expected pharmacological parameters Time to Peak effect (Distribution): Timing and results are as within normal expected parameters Duration of action (Metabolism/Excretion): Within normal limits for medication Pharmacodynamics: Analgesic Effect: More than 50% Activity Facilitation: Medication(s) allow patient to sit, stand, walk, and do the basic ADLs Perceived  Effectiveness: Described as relatively effective, allowing for increase in activities of daily living (ADL) Side-effects or Adverse reactions: None reported Monitoring:  PMP: Online  review of the past 98-monthperiod conducted. Compliant with practice rules and regulations UDS Results/interpretation: The patient's last UDS was done on 04/24/2015 and it came back within normal limits except for the absence of alprazolam which she had indicated that she was taking. The patient has been notified of the CDC guidelines and to avoid mixing benzodiazepines and opioids due to the high risk of respiratory depression and death. Medication Assessment Form: Reviewed. Patient indicates being compliant with therapy Treatment compliance: Compliant Risk Assessment: Aberrant Behavior: None observed today Substance Use Disorder (SUD) Risk Level: No change since last visit Risk of opioid abuse or dependence: 0.7-3.0% with doses ? 36 MME/day and 6.1-26% with doses ? 120 MME/day. Opioid Risk Tool (ORT) Score:  1 Low Risk for SUD (Score <3) Depression Scale Score: PHQ-2: PHQ-2 Total Score: 0 No depression (0) PHQ-9: PHQ-9 Total Score: 0 No depression (0-4)  Pharmacologic Plan: No change in therapy, at this time  Post-Procedure Assessment  Procedure done on last visit: Diagnostic bilateral lumbar facet block #2 under fluoroscopic guidance and IV sedation. Side-effects or Adverse reactions: Postprocedure nausea. Sedation: Sedation given  Results: Ultra-Short Term Relief (First 1 hour after procedure):  (pt too nauseated at this time after the procedure; she does remember having some pain at the injection site.) Immediately after the procedure the patient had indicated that her pain had gone from a 7/10 to a 5/10. Unfortunately this is a second time that we try to get information from this patient and she has not been able to provided to [place] In the case of the first diagnostic bilateral lumbar facet block done on  02/15/2015 the patient indicated that her pain immediately after the procedure had gone from a 7/10 to a 0/10. On the patient's follow-up on 04/24/2015 she indicated 100% relief of the pain for only 1 hour after which she couldn't remember anything else because she did not keep her 2 week follow-up appointment. Analgesia during this period is likely to be Local Anesthetic and/or IV Sedative (Analgesic/Anxiolitic) related Short Term Relief (Initial 4-6 hrs after procedure):  (unable to give percentage as patient was nauseated and sick all day, patient states" went home and stayed in bed all day and part of the next day".) Once again, we're finding this patient to be a very poor historian. Complete relief confirms area to be the source of pain Long Term Relief : 50 % (pain is more toleratble with hips, lower back  relief approx 50 %) Long-term benefit would suggest an inflammatory etiology to the pain   Current Relief (Now): 50%  Persistent relief would suggest effective anti-inflammatory effects from steroids Interpretation of Results: The patient indicates that the procedure done on 05/08/2015 provided her with 50% relief of her pain that seems to continue at this point. Unfortunately, she is a very poor historian and she is not really keeping the pain diary as we had requested. She is turning out to be in very poor compliance when he comes to following up with the results of the treatments and reporting them back.  Laboratory Chemistry  Inflammation Markers Lab Results  Component Value Date   ESRSEDRATE 7 04/25/2015   CRP 0.6 04/25/2015    Renal Function Lab Results  Component Value Date   BUN 28* 04/25/2015   CREATININE 1.15* 04/25/2015   GFRAA 57* 04/25/2015   GFRNONAA 49* 04/25/2015    Hepatic Function Lab Results  Component Value Date   AST 25 04/25/2015   ALT 16 04/25/2015  ALBUMIN 4.5 04/25/2015    Electrolytes Lab Results  Component Value Date   NA 135 04/25/2015   K  4.6 04/25/2015   CL 103 04/25/2015   CALCIUM 10.1 04/25/2015   MG 1.9 04/25/2015    Pain Modulating Vitamins Lab Results  Component Value Date   VD125OH2TOT 55 04/25/2015   [id number] 55 04/25/2015   TG2563SL3 <10 04/25/2015   VITAMINB12 178* 04/25/2015    Coagulation Parameters Lab Results  Component Value Date   INR 1.0 09/16/2012   LABPROT 13.3 09/16/2012    Note: I personally reviewed the above data. Results made available to patient.  Recent Diagnostic Imaging  No results found.  Meds  The patient has a current medication list which includes the following prescription(s): acetaminophen, albuterol, alprazolam, ascorbic acid, aspirin ec, cyanocobalamin, felodipine, metformin, ondansetron, oxycodone, oxycodone, oxycodone, ranitidine, spironolactone, and zolpidem.  Current Outpatient Prescriptions on File Prior to Visit  Medication Sig  . acetaminophen (TYLENOL) 500 MG tablet Take 1,000-1,500 mg by mouth every 6 (six) hours as needed for mild pain.  Marland Kitchen albuterol (PROVENTIL HFA;VENTOLIN HFA) 108 (90 Base) MCG/ACT inhaler Inhale 2 puffs into the lungs every 6 (six) hours as needed for wheezing or shortness of breath.  . ALPRAZolam (XANAX) 0.25 MG tablet Take 0.125-0.25 mg by mouth 2 (two) times daily as needed for anxiety or sleep.  . Ascorbic Acid (VITAMIN C PO) Take 1 tablet by mouth at bedtime.  Marland Kitchen aspirin EC 81 MG tablet Take 81 mg by mouth at bedtime.  . felodipine (PLENDIL) 10 MG 24 hr tablet Take 10 mg by mouth at bedtime.  . metFORMIN (GLUCOPHAGE-XR) 500 MG 24 hr tablet Take 1,000 mg by mouth every evening.  . ranitidine (ZANTAC) 150 MG capsule Take 150 mg by mouth 2 (two) times daily.  Marland Kitchen spironolactone (ALDACTONE) 25 MG tablet Take 25 mg by mouth daily.   Marland Kitchen zolpidem (AMBIEN) 10 MG tablet Take 5-10 mg by mouth at bedtime as needed for sleep.   No current facility-administered medications on file prior to visit.    ROS  Constitutional: Denies any fever or  chills Gastrointestinal: No reported hemesis, hematochezia, vomiting, or acute GI distress Musculoskeletal: Denies any acute onset joint swelling, redness, loss of ROM, or weakness Neurological: No reported episodes of acute onset apraxia, aphasia, dysarthria, agnosia, amnesia, paralysis, loss of coordination, or loss of consciousness  Allergies  Stephanie Short is allergic to codeine; dilaudid; percocet; tramadol; gabapentin; hydrocodone-acetaminophen; hydromorphone; promethazine; and scallops.  [place]  Medical:  Stephanie Short  has a past medical history of Hypertension; Diabetes mellitus; Hepatitis C; Asthma; Insomnia; and Depression. Family: family history includes Colon cancer in her other; Coronary artery disease (age of onset: 50) in her brother; Coronary artery disease (age of onset: 3) in her mother; Diabetes in her father and other; Heart attack in her brother; Heart disease in her father; Hypertension in her other. Surgical:  has past surgical history that includes Back surgery (2013); Hysteroscopy w/D&C (N/A, 08/18/2014); Knee arthroscopy (Right); Colonoscopy w/ polypectomy; and Colonoscopy with propofol (N/A, 03/13/2015). Tobacco:  reports that she quit smoking about 11 years ago. She does not have any smokeless tobacco history on file. Alcohol:  reports that she drinks alcohol. Drug:  reports that she does not use illicit drugs.  Constitutional Exam  Vitals: Blood pressure 146/97, pulse 97, temperature 98.1 F (36.7 C), temperature source Oral, resp. rate 16, height _0  (1.626 m), weight 180 lb (81.647 kg), SpO2 98 %. General appearance: Well nourished, well  developed, and well hydrated. In no acute distress Calculated BMI/Body habitus: Body mass index is 30.88 kg/(m^2). (30-34.9 kg/m2) Obese (Class I) - 68% higher incidence of chronic pain Psych/Mental status: Alert and oriented x 3 (person, place, & time) Eyes: PERLA Respiratory: No evidence of acute respiratory distress  Cervical  Spine Exam  Inspection: No masses, redness, or swelling Alignment: Symmetrical ROM: Functional: Unrestricted ROM Active: Adequate ROM Stability: No instability detected Muscle strength & Tone: Functionally intact Sensory: Unimpaired Palpation: No complaints of tenderness  Upper Extremity (UE) Exam    Side: Right upper extremity  Side: Left upper extremity  Inspection: No masses, redness, swelling, or asymmetry  Inspection: No masses, redness, swelling, or asymmetry  ROM:  ROM:  Functional: Unrestricted ROM  Functional: Unrestricted ROM  Active: Adequate ROM  Active: Adequate ROM  Muscle strength & Tone: Functionally intact  Muscle strength & Tone: Functionally intact  Sensory: Unimpaired  Sensory: Unimpaired  Palpation: Non-contributory  Palpation: Non-contributory   Thoracic Spine Exam  Inspection: No masses, redness, or swelling Alignment: Symmetrical ROM: Functional: Unrestricted ROM Active: Adequate ROM Stability: No instability detected Sensory: Unimpaired Muscle strength & Tone: Functionally intact Palpation: No complaints of tenderness  Lumbar Spine Exam  Inspection: No masses, redness, or swelling Alignment: Symmetrical ROM: Functional: Limited ROM Active: Decreased ROM Stability: No instability detected Muscle strength & Tone: Functionally intact Sensory: Unimpaired Palpation: Tender Provocative Tests: Lumbar Hyperextension and rotation test: Positive for bilateral lumbar facet pain. Patrick's Maneuver: Positive for bilateral sacroiliac joint pain and bilateral hip pain.  Gait & Posture Assessment  Gait: Antalgic Posture: Antalgic  Lower Extremity Exam    Side: Right lower extremity  Side: Left lower extremity  Inspection: No masses, redness, swelling, or asymmetry ROM:  Inspection: No masses, redness, swelling, or asymmetry ROM:  Functional: Unrestricted ROM  Functional: Unrestricted ROM  Active: Adequate ROM  Active: Adequate ROM  Muscle strength &  Tone: Functionally intact  Muscle strength & Tone: Functionally intact  Sensory: Unimpaired  Sensory: Unimpaired  Palpation: Non-contributory  Palpation: Non-contributory   Assessment & Plan  Primary Diagnosis & Pertinent Problem List: The primary encounter diagnosis was Chronic pain. Diagnoses of Encounter for therapeutic drug level monitoring, Long term current use of opiate analgesic, Chronic low back pain (Location of Primary Source of Pain) (Bilateral) (R>L), Opiate use (22.5 MME/Day), Opiate use, Vitamin B12 deficiency, Pain management, Chronic hip pain, unspecified laterality, Lumbar facet syndrome (Bilateral) (R>L), Chronic sacroiliac joint pain (Bilateral), Problem with medical care compliance, and Poor historian were also pertinent to this visit.  Visit Diagnosis: 1. Chronic pain   2. Encounter for therapeutic drug level monitoring   3. Long term current use of opiate analgesic   4. Chronic low back pain (Location of Primary Source of Pain) (Bilateral) (R>L)   5. Opiate use (22.5 MME/Day)   6. Opiate use   7. Vitamin B12 deficiency   8. Pain management   9. Chronic hip pain, unspecified laterality   10. Lumbar facet syndrome (Bilateral) (R>L)   11. Chronic sacroiliac joint pain (Bilateral)   12. Problem with medical care compliance   13. Poor historian     Problems updated and reviewed during this visit: Problem  Problem With Medical Care Compliance  Poor Historian  Pain Management   Back surgery done by Dr. Mauri Pole. Prior pain specialists include: Dr. Mohammed Kindle [facility]), Dr. Sharlet Salina [facility]), Dr. Angie Fava ([facility]), in addition to another physician at [facility] pain clinic which she cannot  recall. Currently being seen and evaluated by Dr. Dossie Arbour [facility])   Opiate use (22.5 MME/Day)   Oxycodone IR 5 mg, half of a tablet every 4 hours with a maximum of 3 tablets per day (15 mg/day of oxycodone)     Problem-specific  Plan(s): Problem with medical care compliance She is not keeping up with the follow-up evaluations after diagnostic procedures and she is also not keeping her postprocedure diary as instructed. In addition to this, she is not bringing her medications to the appointments to be counted when she is also not bring him back her postprocedure diary.   No new assessment & plan notes have been filed under this hospital service since the last note was generated. Service: Pain Management   Plan of Care   Problem List Items Addressed This Visit      High   Chronic hip pain (Bilateral) (L>R) (Chronic)   Relevant Medications   oxyCODONE (OXY IR/ROXICODONE) 5 MG immediate release tablet   oxyCODONE (OXY IR/ROXICODONE) 5 MG immediate release tablet   oxyCODONE (OXY IR/ROXICODONE) 5 MG immediate release tablet   Other Relevant Orders   HIP INJECTION   Chronic low back pain (Location of Primary Source of Pain) (Bilateral) (R>L) (Chronic)   Relevant Medications   oxyCODONE (OXY IR/ROXICODONE) 5 MG immediate release tablet   oxyCODONE (OXY IR/ROXICODONE) 5 MG immediate release tablet   oxyCODONE (OXY IR/ROXICODONE) 5 MG immediate release tablet   Chronic pain - Primary (Chronic)   Relevant Medications   oxyCODONE (OXY IR/ROXICODONE) 5 MG immediate release tablet   oxyCODONE (OXY IR/ROXICODONE) 5 MG immediate release tablet   oxyCODONE (OXY IR/ROXICODONE) 5 MG immediate release tablet   Chronic sacroiliac joint pain (Bilateral) (Chronic)   Relevant Medications   oxyCODONE (OXY IR/ROXICODONE) 5 MG immediate release tablet   oxyCODONE (OXY IR/ROXICODONE) 5 MG immediate release tablet   oxyCODONE (OXY IR/ROXICODONE) 5 MG immediate release tablet   Other Relevant Orders   SACROILIAC JOINT INJECTINS   Lumbar facet syndrome (Bilateral) (R>L) (Chronic)   Relevant Medications   oxyCODONE (OXY IR/ROXICODONE) 5 MG immediate release tablet   oxyCODONE (OXY IR/ROXICODONE) 5 MG immediate release tablet    oxyCODONE (OXY IR/ROXICODONE) 5 MG immediate release tablet   Other Relevant Orders   LUMBAR FACET(MEDIAL BRANCH NERVE BLOCK) MBNB     Medium   Encounter for therapeutic drug level monitoring   Long term current use of opiate analgesic (Chronic)   Relevant Orders   ToxASSURE Select 13 (MW), Urine   Opiate use (22.5 MME/Day) (Chronic)   Relevant Medications   ondansetron (ZOFRAN-ODT) 8 MG disintegrating tablet   Pain management   Poor historian   Problem with medical care compliance    She is not keeping up with the follow-up evaluations after diagnostic procedures and she is also not keeping her postprocedure diary as instructed. In addition to this, she is not bringing her medications to the appointments to be counted when she is also not bring him back her postprocedure diary.        Low   Vitamin B12 deficiency   Relevant Medications   Cyanocobalamin (VITAMELTS ENERGY VITAMIN B-12) 1500 MCG TBDP       Pharmacotherapy (Medications Ordered): Meds ordered this encounter  Medications  . oxyCODONE (OXY IR/ROXICODONE) 5 MG immediate release tablet    Sig: Take 0.5 tablets (2.5 mg total) by mouth every 4 (four) hours as needed for severe pain.    Dispense:  90 tablet  Refill:  0    Do not place this medication, or any other prescription from our practice, on "Automatic Refill". Patient may have prescription filled one day early if pharmacy is closed on scheduled refill date. Do not fill until: 05/24/15 To last until: 06/23/15  . ondansetron (ZOFRAN-ODT) 8 MG disintegrating tablet    Sig: Take 1 tablet (8 mg total) by mouth 3 (three) times daily as needed for nausea or vomiting.    Dispense:  30 tablet    Refill:  2    Do not place this medication, or any other prescription from our practice, on "Automatic Refill". Patient may have prescription filled one day early if pharmacy is closed on scheduled refill date.  . Cyanocobalamin (VITAMELTS ENERGY VITAMIN B-12) 1500 MCG TBDP     Sig: Take 1 tablet by mouth daily.    Dispense:  30 tablet    Refill:  2    Do not place this medication, or any other prescription from our practice, on "Automatic Refill".  . oxyCODONE (OXY IR/ROXICODONE) 5 MG immediate release tablet    Sig: Take 0.5 tablets (2.5 mg total) by mouth every 4 (four) hours as needed for severe pain.    Dispense:  90 tablet    Refill:  0    Do not place this medication, or any other prescription from our practice, on "Automatic Refill". Patient may have prescription filled one day early if pharmacy is closed on scheduled refill date. Do not fill until: 06/23/15 To last until: 07/23/15  . oxyCODONE (OXY IR/ROXICODONE) 5 MG immediate release tablet    Sig: Take 0.5 tablets (2.5 mg total) by mouth every 4 (four) hours as needed for severe pain.    Dispense:  90 tablet    Refill:  0    Do not place this medication, or any other prescription from our practice, on "Automatic Refill". Patient may have prescription filled one day early if pharmacy is closed on scheduled refill date. Do not fill until: 07/23/15 To last until: 08/22/15    [facility] & Procedure Ordered: Orders Placed This Encounter  Procedures  . LUMBAR FACET(MEDIAL BRANCH NERVE BLOCK) MBNB  . SACROILIAC JOINT INJECTINS  . HIP INJECTION  . ToxASSURE Select 13 (MW), Urine    Imaging Ordered: None  Interventional Therapies: Scheduled:  None at this time.    Considering:   1. Repeat diagnostic lumbar facet block under fluoroscopic guidance and IV sedation.  2. Possible bilateral lumbar facet radiofrequency ablation. 3. Possible diagnostic bilateral sacroiliac joint block under fluoroscopic guidance, with or without sedation.  4. Possible diagnostic bilateral intra-articular hip joint injection under fluoroscopic guidance, with or without sedation.    PRN Procedures:   1. For the low back pain, bilateral diagnostic lumbar facet block + diagnostic bilateral sacroiliac joint injection under  fluoroscopic guidance with IV sedation.  2. For the hip pain, diagnostic intra-articular hip joint injection under fluoroscopic guidance, with or without sedation.    Referral(s) or Consult(s): None at this time.  New Prescriptions   No medications on file    Medications administered during this visit: Stephanie Short had no medications administered during this visit.  Requested PM Follow-up: Return in about 3 months (around 08/23/2015) for Medication Management, (3-Mo), Procedure (PRN - Patient will call).  Future Appointments Date Time Provider [place]  06/05/2015 8:00 AM [facility]  06/06/2015 10:30 AM Wallene Huh, DPM [facility]  08/22/2015 1:40 PM Milinda Pointer, MD [facility] None  Primary Care Physician: Kirk Ruths., MD Location: [facility] Note by: Kathlen Brunswick. Dossie Arbour, M.D, DABA, DABAPM, DABPM, DABIPP, FIPP  Pain Score Disclaimer: We use the NRS-11 scale. This is a self-reported, subjective measurement of pain severity with only modest accuracy. It is used primarily to identify changes within a particular patient. It must be understood that outpatient pain scales are significantly less accurate that those used for research, where they can be applied under ideal controlled circumstances with minimal exposure to variables. In reality, the score is likely to be a combination of pain intensity and pain affect, where pain affect describes the degree of emotional arousal or changes in action readiness caused by the sensory experience of pain. Factors such as social and work situation, setting, emotional state, anxiety levels, expectation, and prior pain experience may influence pain perception and show large inter-individual differences that may also be affected by time variables.

## 2015-05-23 NOTE — Progress Notes (Signed)
Safety precautions to be maintained throughout the outpatient stay will include: orient to surroundings, keep bed in low position, maintain call bell within reach at all times, provide assistance with transfer out of bed and ambulation. Patient did not bring her medications with her today.

## 2015-05-23 NOTE — Assessment & Plan Note (Signed)
She is not keeping up with the follow-up evaluations after diagnostic procedures and she is also not keeping her postprocedure diary as instructed. In addition to this, she is not bringing her medications to the appointments to be counted when she is also not bring him back her postprocedure diary.

## 2015-05-30 LAB — TOXASSURE SELECT 13 (MW), URINE

## 2015-06-05 ENCOUNTER — Ambulatory Visit
Admission: RE | Admit: 2015-06-05 | Discharge: 2015-06-05 | Disposition: A | Discharge status: Home / Self Care | Payer: 59 | Source: Ambulatory Visit | Attending: Internal Medicine | Admitting: Internal Medicine

## 2015-06-06 ENCOUNTER — Encounter: Payer: Self-pay | Admitting: Podiatry

## 2015-06-06 ENCOUNTER — Emergency Department: Payer: 59

## 2015-06-06 ENCOUNTER — Ambulatory Visit: Payer: Self-pay | Admitting: Podiatry

## 2015-06-06 ENCOUNTER — Encounter: Payer: Self-pay | Admitting: *Deleted

## 2015-06-06 ENCOUNTER — Ambulatory Visit (INDEPENDENT_AMBULATORY_CARE_PROVIDER_SITE_OTHER): Payer: 59 | Admitting: Podiatry

## 2015-06-06 ENCOUNTER — Emergency Department
Admission: EM | Admit: 2015-06-06 | Discharge: 2015-06-07 | Disposition: A | Discharge status: Home / Self Care | Payer: 59 | Attending: Emergency Medicine | Admitting: Emergency Medicine

## 2015-06-06 VITALS — BP 146/99 | HR 90 | Resp 16 | Ht 64.0 in | Wt 178.0 lb

## 2015-06-06 DIAGNOSIS — Z7982 Long term (current) use of aspirin: Secondary | ICD-10-CM | POA: Diagnosis not present

## 2015-06-06 DIAGNOSIS — N189 Chronic kidney disease, unspecified: Secondary | ICD-10-CM | POA: Diagnosis not present

## 2015-06-06 DIAGNOSIS — B351 Tinea unguium: Secondary | ICD-10-CM | POA: Diagnosis not present

## 2015-06-06 DIAGNOSIS — I159 Secondary hypertension, unspecified: Secondary | ICD-10-CM | POA: Insufficient documentation

## 2015-06-06 DIAGNOSIS — J45909 Unspecified asthma, uncomplicated: Secondary | ICD-10-CM | POA: Insufficient documentation

## 2015-06-06 DIAGNOSIS — Z79899 Other long term (current) drug therapy: Secondary | ICD-10-CM | POA: Insufficient documentation

## 2015-06-06 DIAGNOSIS — Z87891 Personal history of nicotine dependence: Secondary | ICD-10-CM | POA: Insufficient documentation

## 2015-06-06 DIAGNOSIS — E1122 Type 2 diabetes mellitus with diabetic chronic kidney disease: Secondary | ICD-10-CM | POA: Diagnosis not present

## 2015-06-06 DIAGNOSIS — E785 Hyperlipidemia, unspecified: Secondary | ICD-10-CM | POA: Insufficient documentation

## 2015-06-06 DIAGNOSIS — F329 Major depressive disorder, single episode, unspecified: Secondary | ICD-10-CM | POA: Diagnosis not present

## 2015-06-06 DIAGNOSIS — R51 Headache: Secondary | ICD-10-CM

## 2015-06-06 DIAGNOSIS — R519 Headache, unspecified: Secondary | ICD-10-CM

## 2015-06-06 LAB — COMPREHENSIVE METABOLIC PANEL
ALBUMIN: 4.3 g/dL (ref 3.5–5.0)
ALT: 17 U/L (ref 14–54)
AST: 25 U/L (ref 15–41)
Alkaline Phosphatase: 83 U/L (ref 38–126)
Anion gap: 10 (ref 5–15)
BUN: 16 mg/dL (ref 6–20)
CHLORIDE: 101 mmol/L (ref 101–111)
CO2: 26 mmol/L (ref 22–32)
CREATININE: 0.87 mg/dL (ref 0.44–1.00)
Calcium: 10 mg/dL (ref 8.9–10.3)
GFR calc non Af Amer: >60 mL/min (ref >60)
GLUCOSE: 108 mg/dL — AB (ref 65–99)
Potassium: 4.1 mmol/L (ref 3.5–5.1)
SODIUM: 137 mmol/L (ref 135–145)
Total Bilirubin: 0.4 mg/dL (ref 0.3–1.2)
Total Protein: 8.6 g/dL — ABNORMAL HIGH (ref 6.5–8.1)

## 2015-06-06 LAB — CBC
HCT: 40.6 % (ref 35.0–47.0)
Hemoglobin: 13.4 g/dL (ref 12.0–16.0)
MCH: 27.6 pg (ref 26.0–34.0)
MCHC: 32.9 g/dL (ref 32.0–36.0)
MCV: 83.9 fL (ref 80.0–100.0)
PLATELETS: 203 10*3/uL (ref 150–440)
RBC: 4.84 MIL/uL (ref 3.80–5.20)
RDW: 16.6 % — ABNORMAL HIGH (ref 11.5–14.5)
WBC: 7.4 10*3/uL (ref 3.6–11.0)

## 2015-06-06 LAB — TROPONIN I: Troponin I: <0.03 ng/mL (ref <0.031)

## 2015-06-06 MED ORDER — HYDRALAZINE HCL 20 MG/ML IJ SOLN
5.0000 mg | Freq: Once | INTRAMUSCULAR | Status: AC
  Administered 2015-06-06: 5 mg via INTRAVENOUS
  Filled 2015-06-06: qty 1

## 2015-06-06 MED ORDER — PROCHLORPERAZINE EDISYLATE 5 MG/ML IJ SOLN: 10.0000 mg | Freq: Once | INTRAMUSCULAR | Status: DC

## 2015-06-06 MED ORDER — ACETAMINOPHEN 500 MG PO TABS
ORAL_TABLET | ORAL | Status: AC
  Administered 2015-06-06: 1000 mg via ORAL
  Filled 2015-06-06: qty 2

## 2015-06-06 MED ORDER — ACETAMINOPHEN 500 MG PO TABS
1000.0000 mg | ORAL_TABLET | Freq: Once | ORAL | Status: AC
  Administered 2015-06-06: 1000 mg via ORAL

## 2015-06-06 MED ORDER — TERBINAFINE HCL 250 MG PO TABS: 250.0000 mg | ORAL_TABLET | Freq: Every day | ORAL | Status: DC

## 2015-06-06 NOTE — ED Notes (Signed)
Pt in via triage with complaints of high blood pressure, headache, chest tightness since about 1400 today.  Pt reports sob, nausea with chest tightness.  Pt denies any changes in vision.  Pt A/Ox4, hypertensive at this time.  MD at bedside.

## 2015-06-06 NOTE — ED Provider Notes (Signed)
[facility] Emergency Department Provider Note    ____________________________________________  Time seen: ~2225  I have reviewed the triage vital signs and the nursing notes.   HISTORY  Chief Complaint Headache and Chest Pain   History limited by: Not Limited   HPI Stephanie Short is a 65 y.o. female who presents to the emergency department today because of concerns for headache and hypertension. The patient states that she noted her blood pressure was elevated today. She states that normally he is never this elevated. She states she was getting readings of systolic in the 810F and diastolic in the 751W. She states that with this increase in blood pressure she was also having headache and some chest tightness. She states she is on blood pressure medications. She denies missing any recent doses. She denies any fevers.   Past Medical History  Diagnosis Date  . Hypertension   . Diabetes mellitus   . Hepatitis C     Dr. 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 Asthma   . Insomnia   . Depression     Patient Active Problem List   Diagnosis Date Noted  . Problem with medical care compliance 05/23/2015  . Poor historian 05/23/2015  . Vitamin B12 deficiency 04/28/2015  . Chronic hip pain (Bilateral) (L>R) 02/01/2015  . Greater trochanteric bursitis of hips (Bilateral) 02/01/2015  . Chronic sacroiliac joint pain (Bilateral) 02/01/2015  . Osteoarthritis of hips (Bilateral) 02/01/2015  . Pain management 02/01/2015  . Chronic pain of lower extremity  (Bilateral) (L>R) 02/01/2015  . Postmenopausal osteoporosis 02/01/2015  . Chronic cervical radicular pain 02/01/2015  . Neurogenic pain 02/01/2015  . Chronic kidney disease 02/01/2015  . Chronic pain 01/31/2015  . Chronic low back pain (Location of Primary Source of Pain) (Bilateral) (R>L) 01/31/2015  . Lumbar spondylosis 01/31/2015  . 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 Lumbar facet  syndrome (Bilateral) (R>L) 01/31/2015  . Long term current use of opiate analgesic 01/31/2015  . Long term prescription opiate use 01/31/2015  . Opiate use (22.5 MME/Day) 01/31/2015  . Encounter for therapeutic drug level monitoring 01/31/2015  . Encounter for chronic pain management 01/31/2015  . Osteopenia 01/31/2015  . Endometrial polyp 08/15/2014  . Hemorrhage, postmenopausal 08/02/2014  . Pure hypercholesterolemia 05/23/2014  . Major depression in remission ([place]) 01/11/2014  . Atherosclerosis of abdominal aorta ([place]) 12/31/2013  . Cervical nerve root disorder 09/15/2013  . Burning or prickling sensation 06/29/2013  . Airway hyperreactivity 06/12/2013  . Diabetes mellitus ([place]) 06/12/2013  . Benign essential HTN 06/12/2013  . Cannot sleep 06/12/2013  . HYPERLIPIDEMIA-MIXED 10/29/2009  . AORTIC ATHEROSCLEROSIS 10/29/2009    Past Surgical History  Procedure Laterality Date  . Back surgery  2013    [facility] Dr. Mauri Pole  . Hysteroscopy w/d&c N/A 08/18/2014    Procedure: DILATATION AND CURETTAGE /HYSTEROSCOPY;  Surgeon: Lorette Ang, MD;  Location: [facility] ORS;  Service: Gynecology;  Laterality: N/A;  . Knee arthroscopy Right   . Colonoscopy w/ polypectomy    . Colonoscopy with propofol N/A 03/13/2015    Procedure: COLONOSCOPY WITH PROPOFOL;  Surgeon: Lollie Sails, MD;  Location: [facility] ENDOSCOPY;  Service: Endoscopy;  Laterality: N/A;    Current Outpatient Rx  Name  Route  Sig  Dispense  Refill  . acetaminophen (TYLENOL) 500 MG tablet   Oral   Take 1,000-1,500 mg by mouth every 6 (six) hours as needed for mild pain.         Marland Kitchen  albuterol (PROVENTIL HFA;VENTOLIN HFA) 108 (90 Base) MCG/ACT inhaler   Inhalation   Inhale 2 puffs into the lungs every 6 (six) hours as needed for wheezing or shortness of breath.         . ALPRAZolam (XANAX) 0.25 MG tablet   Oral   Take 0.125-0.25 mg by mouth 2 (two) times daily as needed for anxiety or sleep.         . Ascorbic Acid (VITAMIN  C PO)   Oral   Take 1 tablet by mouth at bedtime.         Marland Kitchen aspirin EC 81 MG tablet   Oral   Take 81 mg by mouth at bedtime.         . Cyanocobalamin (VITAMELTS ENERGY VITAMIN B-12) 1500 MCG TBDP   Oral   Take 1 tablet by mouth daily.   30 tablet   2     Do not place this medication, or any other prescri ...   . felodipine (PLENDIL) 10 MG 24 hr tablet   Oral   Take 10 mg by mouth at bedtime.         . metFORMIN (GLUCOPHAGE-XR) 500 MG 24 hr tablet   Oral   Take 1,000 mg by mouth every evening.         . ondansetron (ZOFRAN-ODT) 8 MG disintegrating tablet   Oral   Take 1 tablet (8 mg total) by mouth 3 (three) times daily as needed for nausea or vomiting.   30 tablet   2     Do not place this medication, or any other prescri ...   . oxyCODONE (OXY IR/ROXICODONE) 5 MG immediate release tablet   Oral   Take 0.5 tablets (2.5 mg total) by mouth every 4 (four) hours as needed for severe pain.   90 tablet   0     Do not place this medication, or any other prescri ...   . ranitidine (ZANTAC) 150 MG capsule   Oral   Take 150 mg by mouth 2 (two) times daily.      5   . spironolactone (ALDACTONE) 25 MG tablet   Oral   Take 25 mg by mouth daily.          Marland Kitchen terbinafine (LAMISIL) 250 MG tablet   Oral   Take 1 tablet (250 mg total) by mouth daily.   60 tablet   0   . zolpidem (AMBIEN) 10 MG tablet   Oral   Take 5-10 mg by mouth at bedtime as needed for sleep.           Allergies Codeine; Dilaudid; Percocet; Tramadol; Gabapentin; Hydrocodone-acetaminophen; Hydromorphone; Promethazine; and [place]all[place]  Family History  Problem Relation Age of Onset  . Coronary artery disease Mother 41  . Coronary artery disease Brother 59  . Heart attack Brother     MI  . Diabetes Other   . Hypertension Other   . Colon cancer Other   . Heart disease Father   . Diabetes Father     Social History Social History  Substance Use Topics  . Smoking status: Former Smoker  -- 1.00 packs/day for 20 years    Quit date: 01/21/2004  . Smokeless tobacco: None  . Alcohol Use: 0.0 - 0.6 oz/week    0-1 Glasses of wine per week     Comment: Social    Review of Systems  Constitutional: Negative for fever. Cardiovascular: Positive for chest tightness Respiratory: Negative for shortness of breath. Gastrointestinal:  Negative for abdominal pain, vomiting and diarrhea. Neurological: Positive for headache   10-point ROS otherwise negative.  ____________________________________________   PHYSICAL EXAM:  VITAL SIGNS: ED Triage Vitals  Enc Vitals Group     BP 06/06/15 1900 171/90 mmHg     Pulse Rate 06/06/15 1900 106     Resp 06/06/15 1900 20     Temp 06/06/15 1900 98.2 F (36.8 C)     Temp Source 06/06/15 1900 Oral     SpO2 06/06/15 1900 96 %     Weight 06/06/15 1900 179 lb (81.194 kg)     Height 06/06/15 1900 5' 4" (1.626 m)     Head Cir --      Peak Flow --      Pain Score 06/06/15 1902 7   Constitutional: Alert and oriented. Well appearing and in no distress. Eyes: Conjunctivae are normal. PERRL. Normal extraocular movements. ENT   Head: Normocephalic and atraumatic.   Nose: No congestion/rhinnorhea.   Mouth/Throat: Mucous membranes are moist.   Neck: No stridor. Hematological/Lymphatic/Immunilogical: No cervical lymphadenopathy. Cardiovascular: Normal rate, regular rhythm.  No murmurs, rubs, or gallops. Respiratory: Normal respiratory effort without tachypnea nor retractions. Breath sounds are clear and equal bilaterally. No wheezes/rales/rhonchi. Gastrointestinal: Soft and nontender. No distention.  Genitourinary: Deferred Musculoskeletal: Normal range of motion in all extremities. No joint effusions.  No lower extremity tenderness nor edema. Neurologic:  Normal speech and language. No gross focal neurologic deficits are appreciated.  Skin:  Skin is warm, dry and intact. No rash noted. Psychiatric: Mood and affect are normal.  Speech and behavior are normal. Patient exhibits appropriate insight and judgment.  ____________________________________________    LABS (pertinent positives/negatives)  Labs Reviewed  CBC - Abnormal; Notable for the following:    RDW 16.6 (*)    All other components within normal limits  COMPREHENSIVE METABOLIC PANEL - Abnormal; Notable for the following:    Glucose, Bld 108 (*)    Total Protein 8.6 (*)    All other components within normal limits  TROPONIN I     ____________________________________________   EKG  I, Nance Pear, attending physician, personally viewed and interpreted this EKG  EKG Time: 1902 Rate: 103 Rhythm: sinus tachycardia Axis: normal Intervals: qtc 474 QRS: narrow ST changes: no st elevation Impression: abnormal ekg   ____________________________________________    RADIOLOGY  CXR IMPRESSION: Areas of mild scarring bilaterally. No edema or consolidation. Atherosclerotic calcification noted in the right carotid artery.  ____________________________________________   PROCEDURES  Procedure(s) performed: None  Critical Care performed: No  ____________________________________________   INITIAL IMPRESSION / ASSESSMENT AND PLAN / ED COURSE  Pertinent labs & imaging results that were available during my care of the patient were reviewed by me and considered in my medical decision making (see chart for details).  Patient presented to the emergency department today because of concerns for high blood pressure, headache and some chest tightness. Patient's blood pressure is quite elevated. On physical exam no concerning findings. Will give medication to tide to decrease blood pressure and treat symptoms. Blood work without any concerning findings.  ____________________________________________   FINAL CLINICAL IMPRESSION(S) / ED DIAGNOSES  Final diagnoses:  Headache, unspecified headache type  Secondary hypertension, unspecified      Nance Pear, MD 06/07/15 1229

## 2015-06-06 NOTE — ED Notes (Addendum)
Pt reports she has a headache and chest tightness today.  No v/d. Pt states pain radiates into back   Pt reports sob. No cough.  Non smoker pt alert speech clear.   Pt took no otc meds today for h/a

## 2015-06-06 NOTE — Progress Notes (Signed)
Subjective:    Patient ID: Stephanie Short, female    DOB: 1950-07-29, 65 y.o.   MRN: [id number]  HPI Chief Complaint  Patient presents with  . Nail Problem    Bilateral; great toes; nail discoloration & thickened nails; Right foot-great toe-lateral; pt stated, "wants nail checked for ingrown; was red & swollen last week"      Review of Systems  HENT: Positive for hearing loss and tinnitus.   Respiratory: Positive for wheezing.   Endocrine: Positive for polydipsia and polyuria.  Genitourinary: Positive for frequency.  Musculoskeletal: Positive for back pain.  Skin: Positive for color change.  Neurological: Positive for dizziness, weakness, light-headedness and numbness.  All other systems reviewed and are negative.      Objective:   Physical Exam        Assessment & Plan:

## 2015-06-06 NOTE — Progress Notes (Signed)
Subjective:     Patient ID: Stephanie Short, female   DOB: [birth year], 65 y.o.   MRN: [id number]  HPI patient presents with significant discoloration of the hallux nails bilateral stating that she wants to try to do something to improve the condition   Review of Systems  All other systems reviewed and are negative.      Objective:   Physical Exam  Constitutional: She is oriented to person, place, and time.  Cardiovascular: Intact distal pulses.   Musculoskeletal: Normal range of motion.  Neurological: She is oriented to person, place, and time.  Skin: Skin is warm and dry.  Nursing note and vitals reviewed.  neurovascular status intact muscle strength adequate with patient found to have thickened hallux nailbeds bilateral and thick and second nails with digital deformity and elongation of the toes noted. Patient's found to have good digital perfusion and is well oriented 3     Assessment:     Combination of mycotic nail infection it's been documented before along with digital disease creating thickness of the nailbed    Plan:     H&P and conditions reviewed with patient. Today I went ahead and discussed different treatment options and she wants to be aggressive and I've recommended a combination of oral Lamisil for 60 days and I did review her last liver function studies along with laser treatment and topical formulas 3 treatment. I explained risk and she wants to do this treatment and is scheduled for laser and was sent me any other blood work that she may have

## 2015-06-06 NOTE — Discharge Instructions (Signed)
Please seek medical attention for any high fevers, chest pain, shortness of breath, change in behavior, persistent vomiting, bloody stool or any other new or concerning symptoms.   Hypertension Hypertension is another name for high blood pressure. High blood pressure forces your heart to work harder to pump blood. A blood pressure reading has two numbers, which includes a higher number over a lower number (example: 110/72). HOME CARE   Have your blood pressure rechecked by your doctor.  Only take medicine as told by your doctor. Follow the directions carefully. The medicine does not work as well if you skip doses. Skipping doses also puts you at risk for problems.  Do not smoke.  Monitor your blood pressure at home as told by your doctor. GET HELP IF:  You think you are having a reaction to the medicine you are taking.  You have repeat headaches or feel dizzy.  You have puffiness (swelling) in your ankles.  You have trouble with your vision. GET HELP RIGHT AWAY IF:   You get a very bad headache and are confused.  You feel weak, numb, or faint.  You get chest or belly (abdominal) pain.  You throw up (vomit).  You cannot breathe very well. MAKE SURE YOU:   Understand these instructions.  Will watch your condition.  Will get help right away if you are not doing well or get worse.   This information is not intended to replace advice given to you by your health care provider. Make sure you discuss any questions you have with your health care provider.   Document Released: 06/25/2007 Document Revised: 01/11/2013 Document Reviewed: 10/29/2012 Elsevier Interactive Patient Education [employer].

## 2015-06-06 NOTE — Patient Instructions (Signed)
ANTIBACTERIAL SOAP INSTRUCTIONS  THE DAY AFTER PROCEDURE  Please follow the instructions your doctor has marked.   Shower as usual. Before getting out, place a drop of antibacterial liquid soap (Dial) on a wet, clean washcloth.  Gently wipe washcloth over affected area.  Afterward, rinse the area with warm water.  Blot the area dry with a soft cloth and cover with antibiotic ointment (neosporin, polysporin, bacitracin) and band aid or gauze and tape  Place 3-4 drops of antibacterial liquid soap in a quart of warm tap water.  Submerge foot into water for 20 minutes.  If bandage was applied after your procedure, leave on to allow for easy lift off, then remove and continue with soak for the remaining time.  Next, blot area dry with a soft cloth and cover with a bandage.  Apply other medications as directed by your doctor, such as cortisporin otic solution (eardrops) or neosporin antibiotic ointment  Soak toe/toes for approximately 1-2 weeks, allow affected toe to air dry periodically throughout the day when not wearing shoes. When wearing a shoe, apply neosporin as needed with a light bandage to protect the nail bed.  Leave original bandage ( this is the big blue bandage you leave the office wearing) in place for 24 hours if tolerable. Begin first soak after the 24 hour period. If severe pain or throbbing occurs you can begin your first soak before the 24 hour period. Submerge toe with bandages on into soapy water to allow bandages to loosen, then remove bandages. Allow area to air dry before dressing with a bandage.  Drainage for approximately 2-4 weeks is normal,  Discomfort to area is to be expected. Tylenol or ibuprofen can be taken as directed if tolerated.  Any severe pain, blistering, increased swelling or redness needs to be evaluated, please call the office with any questions or concerns

## 2015-06-07 NOTE — ED Provider Notes (Signed)
-----------------------------------------   12:18 AM on 06/07/2015 -----------------------------------------  Blood pressure is improved. Patient feeling better despite declining Compazine. She has an appointment with her PCP Dr. Ouida Sills at 8:15 this morning. I encouraged her to keep her follow-up appointment and to return to the emergency department for any concerns. Strict return precautions given. Patient verbalizes understanding and agrees with plan of care.  Paulette Blanch, MD 06/07/15 [phone]

## 2015-06-07 NOTE — ED Notes (Signed)
Dr. Beather Arbour returns to the bedside to speak with patient. Patient pending discharge following MD conversation with patient.

## 2015-06-08 ENCOUNTER — Emergency Department
Admission: EM | Admit: 2015-06-08 | Discharge: 2015-06-08 | Disposition: A | Discharge status: Home / Self Care | Payer: 59 | Attending: Emergency Medicine | Admitting: Emergency Medicine

## 2015-06-08 DIAGNOSIS — I10 Essential (primary) hypertension: Secondary | ICD-10-CM | POA: Insufficient documentation

## 2015-06-08 DIAGNOSIS — Z5321 Procedure and treatment not carried out due to patient leaving prior to being seen by health care provider: Secondary | ICD-10-CM | POA: Diagnosis not present

## 2015-06-08 MED ORDER — ACETAMINOPHEN 325 MG PO TABS
650.0000 mg | ORAL_TABLET | Freq: Once | ORAL | Status: AC
  Administered 2015-06-08: 650 mg via ORAL

## 2015-06-08 MED ORDER — ACETAMINOPHEN 325 MG PO TABS
ORAL_TABLET | ORAL | Status: AC
  Filled 2015-06-08: qty 2

## 2015-06-08 NOTE — ED Notes (Signed)
Patient wanted a recheck after the first initial reading because she "couldn't believe it." Recheck on right arm 125/80

## 2015-06-08 NOTE — ED Notes (Signed)
Patient to ED for elevated blood pressure at home. Asked if she came to the ED because of the number or because she had symptoms, she states she had headache and nausea. Headache still present but nausea has subsided due to Zofran at home. She was concerned because her brother had a stroke about 8-9 years ago.

## 2015-06-08 NOTE — ED Notes (Signed)
Per Dr. Beather Arbour, no repeat labs necessary tonight as patient was worked up last night for same complaint. As patient does not complain of CP no EKG required at this time. Verbal order for Tylenol received.

## 2015-06-15 ENCOUNTER — Other Ambulatory Visit: Payer: Self-pay | Admitting: Internal Medicine

## 2015-06-15 DIAGNOSIS — Z1231 Encounter for screening mammogram for malignant neoplasm of breast: Secondary | ICD-10-CM

## 2015-06-27 ENCOUNTER — Other Ambulatory Visit: Payer: 59

## 2015-08-14 ENCOUNTER — Telehealth: Payer: Self-pay | Admitting: *Deleted

## 2015-08-14 NOTE — Telephone Encounter (Signed)
Pharmacy asking if ok to continue giving SL Vitamin B, because that is what she has been getting in the past. Pharmacy informed ok to give SL.

## 2015-08-22 ENCOUNTER — Encounter: Payer: 59 | Admitting: Pain Medicine

## 2015-08-29 ENCOUNTER — Ambulatory Visit
Admission: RE | Admit: 2015-08-29 | Discharge: 2015-08-29 | Disposition: A | Discharge status: Home / Self Care | Payer: 59 | Source: Ambulatory Visit | Attending: Internal Medicine | Admitting: Internal Medicine

## 2015-08-29 ENCOUNTER — Other Ambulatory Visit: Payer: Self-pay | Admitting: Internal Medicine

## 2015-08-29 DIAGNOSIS — Z1231 Encounter for screening mammogram for malignant neoplasm of breast: Secondary | ICD-10-CM | POA: Diagnosis not present

## 2015-10-11 ENCOUNTER — Encounter: Payer: 59 | Admitting: Pain Medicine

## 2015-10-17 ENCOUNTER — Ambulatory Visit: Payer: 59 | Attending: Pain Medicine | Admitting: Pain Medicine

## 2015-10-17 ENCOUNTER — Encounter: Payer: Self-pay | Admitting: Pain Medicine

## 2015-10-17 VITALS — BP 138/74 | HR 107 | Temp 97.8°F | Resp 18 | Ht 62.0 in | Wt 179.0 lb

## 2015-10-17 DIAGNOSIS — Z7984 Long term (current) use of oral hypoglycemic drugs: Secondary | ICD-10-CM | POA: Diagnosis not present

## 2015-10-17 DIAGNOSIS — M47816 Spondylosis without myelopathy or radiculopathy, lumbar region: Secondary | ICD-10-CM

## 2015-10-17 DIAGNOSIS — M539 Dorsopathy, unspecified: Secondary | ICD-10-CM

## 2015-10-17 DIAGNOSIS — Z5181 Encounter for therapeutic drug level monitoring: Secondary | ICD-10-CM

## 2015-10-17 DIAGNOSIS — F1190 Opioid use, unspecified, uncomplicated: Secondary | ICD-10-CM

## 2015-10-17 DIAGNOSIS — G8929 Other chronic pain: Secondary | ICD-10-CM | POA: Diagnosis not present

## 2015-10-17 DIAGNOSIS — M25551 Pain in right hip: Secondary | ICD-10-CM | POA: Insufficient documentation

## 2015-10-17 DIAGNOSIS — E538 Deficiency of other specified B group vitamins: Secondary | ICD-10-CM | POA: Diagnosis not present

## 2015-10-17 DIAGNOSIS — Z87891 Personal history of nicotine dependence: Secondary | ICD-10-CM | POA: Diagnosis not present

## 2015-10-17 DIAGNOSIS — I129 Hypertensive chronic kidney disease with stage 1 through stage 4 chronic kidney disease, or unspecified chronic kidney disease: Secondary | ICD-10-CM | POA: Insufficient documentation

## 2015-10-17 DIAGNOSIS — M25552 Pain in left hip: Secondary | ICD-10-CM | POA: Diagnosis present

## 2015-10-17 DIAGNOSIS — E1122 Type 2 diabetes mellitus with diabetic chronic kidney disease: Secondary | ICD-10-CM | POA: Insufficient documentation

## 2015-10-17 DIAGNOSIS — M542 Cervicalgia: Secondary | ICD-10-CM | POA: Diagnosis not present

## 2015-10-17 DIAGNOSIS — M25559 Pain in unspecified hip: Secondary | ICD-10-CM

## 2015-10-17 DIAGNOSIS — Z79899 Other long term (current) drug therapy: Secondary | ICD-10-CM | POA: Insufficient documentation

## 2015-10-17 DIAGNOSIS — Z79891 Long term (current) use of opiate analgesic: Secondary | ICD-10-CM | POA: Diagnosis not present

## 2015-10-17 DIAGNOSIS — N189 Chronic kidney disease, unspecified: Secondary | ICD-10-CM | POA: Diagnosis not present

## 2015-10-17 DIAGNOSIS — Z885 Allergy status to narcotic agent status: Secondary | ICD-10-CM | POA: Insufficient documentation

## 2015-10-17 DIAGNOSIS — E782 Mixed hyperlipidemia: Secondary | ICD-10-CM | POA: Insufficient documentation

## 2015-10-17 DIAGNOSIS — Z7982 Long term (current) use of aspirin: Secondary | ICD-10-CM | POA: Diagnosis not present

## 2015-10-17 DIAGNOSIS — M545 Low back pain: Secondary | ICD-10-CM

## 2015-10-17 DIAGNOSIS — Z91013 Allergy to seafood: Secondary | ICD-10-CM | POA: Diagnosis not present

## 2015-10-17 DIAGNOSIS — M961 Postlaminectomy syndrome, not elsewhere classified: Secondary | ICD-10-CM

## 2015-10-17 MED ORDER — ONDANSETRON 4 MG PO TBDP: 4.0000 mg | ORAL_TABLET | Freq: Three times a day (TID) | ORAL | 2 refills | Status: DC | PRN

## 2015-10-17 MED ORDER — OXYCODONE HCL 5 MG PO TABS
2.5000 mg | ORAL_TABLET | ORAL | 0 refills | Status: DC | PRN
Start: 2015-10-17 — End: 2015-11-26

## 2015-10-17 MED ORDER — CYANOCOBALAMIN 1500 MCG PO TBDP: 1.0000 | ORAL_TABLET | Freq: Every day | ORAL | 2 refills | Status: DC

## 2015-10-17 NOTE — Patient Instructions (Signed)
Please read the Pain Scale handout You were given a prescription for Oxycodone today. Prescriptions for Zofran and Cyanocobalamin were sent electronically to your pharmacy.

## 2015-10-17 NOTE — Progress Notes (Signed)
Patient's Name: Stephanie Short  MRN: [id number]  Referring Provider: Kirk Ruths, MD  DOB: 12-27-1950  PCP: Stephanie Short., MD  DOS: 10/17/2015  Note by: Kathlen Brunswick. Dossie Arbour, MD  Service setting: Ambulatory outpatient  Specialty: Interventional Pain Management  Location: [facility] (AMB) Pain Management Facility    Patient type: Established   Primary Reason(s) for Visit: Encounter for prescription drug management (Level of risk: moderate) CC: Back Pain (low) and Hip Pain (bilateral)  HPI  [name] is a 65 y.o. year old, female patient, who comes today for an initial evaluation. She has HYPERLIPIDEMIA-MIXED; AORTIC ATHEROSCLEROSIS; Endometrial polyp; Chronic pain; Chronic low back pain (Location of Primary Source of Pain) (Bilateral) (R>L); Lumbar spondylosis; Failed back surgical syndrome; Lumbar facet syndrome (Bilateral) (R>L); Long term current use of opiate analgesic; Long term prescription opiate use; Opiate use (22.5 MME/Day); Encounter for therapeutic drug level monitoring; Encounter for chronic pain management; Airway hyperreactivity; Atherosclerosis of abdominal aorta ([place]); Cervical nerve root disorder; Diabetes mellitus ([place]); Benign essential HTN; Cannot sleep; Major depression in remission ([place]); Burning or prickling sensation; Hemorrhage, postmenopausal; Pure hypercholesterolemia; Osteopenia; Chronic hip pain(2) (Bilateral) (L>R); Greater trochanteric bursitis of hips (Bilateral); Chronic sacroiliac joint pain (Bilateral); Osteoarthritis of hips (Bilateral); Pain management; Chronic pain of lower extremity  (Bilateral) (L>R); Postmenopausal osteoporosis; Chronic cervical radicular pain; Neurogenic pain; Chronic kidney disease; Vitamin B12 deficiency; Problem with medical care compliance; Poor historian; Asthma; and Chronic neck pain (3) (B) (R>L) on her problem list.. Her primarily concern today is the Back Pain (low) and Hip Pain (bilateral)  Pain Assessment: Self-Reported  Pain Score: 7 /10 Clinically the patient looks like a 2/10 Reported level is inconsistent with clinical observations. Information on the proper use of the pain score provided to the patient today. Pain Type: Chronic pain Pain Location: Back (hips) Pain Orientation: Lower (bilateral hips) Pain Descriptors / Indicators: Aching, Constant Pain Frequency: Constant  The patient comes into the clinics today for pharmacological management of her chronic pain. I last saw this patient on 05/23/2015. The patient  reports that she does not use drugs. Her body mass index is 32.74 kg/m.  Date of Last Visit: 05/23/15 Service Provided on Last Visit: Med Refill  Controlled Substance Pharmacotherapy Assessment & REMS (Risk Evaluation and Mitigation Strategy)  Analgesic: Oxycodone IR 2.5 mg, 1 tablet every 4 hours PRN Maximum: 3/day. (7.5 mg/day of oxycodone) MME/day: 11.25  Mg/day Pill Count: Did not bring pain bottles  For pill count.  States she has been out of her medicine for a little over a month. Patient reminded that she needs to bring the pills to be candidate and if she runs out then she needs to bring the empty bottle. Patient informed that should she not bring those, she will not get a refill on her medication. Pharmacokinetics: Onset of action (Liberation/Absorption): Within expected pharmacological parameters Time to Peak effect (Distribution): Timing and results are as within normal expected parameters Duration of action (Metabolism/Excretion): Within normal limits for medication Pharmacodynamics: Analgesic Effect: More than 50% Activity Facilitation: Medication(s) allow patient to sit, stand, walk, and do the basic ADLs Perceived Effectiveness: Described as relatively effective, allowing for increase in activities of daily living (ADL) Side-effects or Adverse reactions: None reported Monitoring: [place] PMP: Online review of the past 71-monthperiod conducted. Compliant with practice rules and  regulations List of all UDS test(s) done:  Lab Results  Component Value Date   [place] 05/23/2015   [place] 04/24/2015   [place] 01/31/2015  Last UDS on record: ToxAssure Select 13  Date Value Ref Range Status  05/23/2015 FINAL  Final    Comment:    ==================================================================== TOXASSURE SELECT 13 (MW) ==================================================================== Test                             Result       Flag       Units Drug Absent but Declared for Prescription Verification   Alprazolam                     Not Detected UNEXPECTED ng/mg creat   Oxycodone                      Not Detected UNEXPECTED ng/mg creat ==================================================================== Test                      Result    Flag   Units      Ref Range   Creatinine              372              mg/dL      >=20 ==================================================================== Declared Medications:  The flagging and interpretation on this report are based on the  following declared medications.  Unexpected results may arise from  inaccuracies in the declared medications.  **Note: The testing scope of this panel includes these medications:  Alprazolam (Xanax)  Oxycodone  **Note: The testing scope of this panel does not include following  reported medications:  Acetaminophen (Tylenol)  Albuterol  Aspirin (Aspirin 81)  Cyanocobalamin  Felodipine  Metformin  Ondansetron  Ranitidine (Zantac)  Spironolactone (Aldactone)  Vitamin C  Zolpidem (Ambien) ==================================================================== For clinical consultation, please call [phone]. ====================================================================    UDS interpretation: Compliant Patient informed of the CDC guidelines and recommendations to stay away from the concomitant use of benzodiazepines and opioids due to  the increased risk of respiratory depression and death. Medication Assessment Form: Reviewed. Patient indicates being compliant with therapy Treatment compliance: Compliant Risk Assessment: Aberrant Behavior: None observed today Substance Use Disorder (SUD) Risk Level: Low-to-moderate Risk of opioid abuse or dependence: 0.7-3.0% with doses ? 36 MME/day and 6.1-26% with doses ? 120 MME/day. Opioid Risk Tool (ORT) Score: 0   Low Risk for SUD (Score <3) Depression Scale Score: PHQ-2: 0   No depression (0) PHQ-9: 0   No depression (0-4)  Pharmacologic Plan: No change in therapy, at this time  Laboratory Chemistry  Inflammation Markers Lab Results  Component Value Date   ESRSEDRATE 7 04/25/2015   CRP 0.6 04/25/2015   Renal Function Lab Results  Component Value Date   BUN 16 06/06/2015   CREATININE 0.87 06/06/2015   GFRAA >60 06/06/2015   GFRNONAA >60 06/06/2015   Hepatic Function Lab Results  Component Value Date   AST 25 06/06/2015   ALT 17 06/06/2015   ALBUMIN 4.3 06/06/2015   Electrolytes Lab Results  Component Value Date   NA 137 06/06/2015   K 4.1 06/06/2015   CL 101 06/06/2015   CALCIUM 10.0 06/06/2015   MG 1.9 04/25/2015   Pain Modulating Vitamins Lab Results  Component Value Date   VD125OH2TOT 55 04/25/2015   QA8341DQ2 55 04/25/2015   VD2125OH2 <10 04/25/2015   VITAMINB12 178 (L) 04/25/2015   Coagulation Parameters Lab Results  Component Value Date   INR 1.0 09/16/2012   LABPROT 13.3 09/16/2012  PLT 203 06/06/2015   Cardiovascular Lab Results  Component Value Date   HGB 13.4 06/06/2015   HCT 40.6 06/06/2015    Note: Lab results reviewed.  Recent Diagnostic Imaging  Mm Screening Breast Tomo Bilateral  Result Date: 08/30/2015 CLINICAL DATA:  Screening. EXAM: 2D DIGITAL SCREENING BILATERAL MAMMOGRAM WITH CAD AND ADJUNCT TOMO COMPARISON:  Previous exam(s). ACR Breast Density Category b: There are scattered areas of fibroglandular density.  FINDINGS: There are no findings suspicious for malignancy. Images were processed with CAD. IMPRESSION: No mammographic evidence of malignancy. A result letter of this screening mammogram will be mailed directly to the patient. RECOMMENDATION: Screening mammogram in one year. (Code:[id number]-B-01Y) BI-RADS CATEGORY  1: Negative. Electronically Signed   By: Lillia Mountain M.D.   On: 08/30/2015 09:18   Meds  The patient has a current medication list which includes the following prescription(s): acetaminophen, albuterol, alprazolam, ascorbic acid, aspirin ec, cyanocobalamin, felodipine, metformin, ondansetron, oxycodone, pantoprazole, ranitidine, spironolactone, and zolpidem.  Current Outpatient Prescriptions on File Prior to Visit  Medication Sig  . acetaminophen (TYLENOL) 500 MG tablet Take 1,000-1,500 mg by mouth every 6 (six) hours as needed for mild pain.  Marland Kitchen albuterol (PROVENTIL HFA;VENTOLIN HFA) 108 (90 Base) MCG/ACT inhaler Inhale 2 puffs into the lungs every 6 (six) hours as needed for wheezing or shortness of breath.  . ALPRAZolam (XANAX) 0.25 MG tablet Take 0.125-0.25 mg by mouth 2 (two) times daily as needed for anxiety or sleep.  . Ascorbic Acid (VITAMIN C PO) Take 1 tablet by mouth at bedtime.  Marland Kitchen aspirin EC 81 MG tablet Take 81 mg by mouth at bedtime.  . felodipine (PLENDIL) 10 MG 24 hr tablet Take 10 mg by mouth at bedtime.  . metFORMIN (GLUCOPHAGE-XR) 500 MG 24 hr tablet Take 1,000 mg by mouth every evening.  . ranitidine (ZANTAC) 150 MG capsule Take 150 mg by mouth 2 (two) times daily.  Marland Kitchen spironolactone (ALDACTONE) 25 MG tablet Take 25 mg by mouth daily.   Marland Kitchen zolpidem (AMBIEN) 10 MG tablet Take 5-10 mg by mouth at bedtime as needed for sleep.   No current facility-administered medications on file prior to visit.    ROS  Constitutional: Denies any fever or chills Gastrointestinal: No reported hemesis, hematochezia, vomiting, or acute GI distress Musculoskeletal: Denies any acute onset joint  swelling, redness, loss of ROM, or weakness Neurological: No reported episodes of acute onset apraxia, aphasia, dysarthria, agnosia, amnesia, paralysis, loss of coordination, or loss of consciousness  Allergies  Ms. Muilenburg is allergic to codeine; dilaudid [hydromorphone hcl]; percocet [oxycodone-acetaminophen]; tramadol; gabapentin; hydrocodone-acetaminophen; hydromorphone; oxycodone-acetaminophen; promethazine; and scallops [shellfish allergy].  [place]  Medical:  Ms. Rabbani  has a past medical history of Asthma; Depression; Diabetes mellitus; Hepatitis C; Hypertension; and Insomnia. Family: family history includes Colon cancer in her other; Coronary artery disease (age of onset: 52) in her brother; Coronary artery disease (age of onset: 62) in her mother; Diabetes in her father and other; Heart attack in her brother; Heart disease in her father; Hypertension in her other. Surgical:  has a past surgical history that includes Back surgery (2013); Hysteroscopy w/D&C (N/A, 08/18/2014); Knee arthroscopy (Right); Colonoscopy w/ polypectomy; and Colonoscopy with propofol (N/A, 03/13/2015). Tobacco:  reports that she quit smoking about 11 years ago. She has a 20.00 pack-year smoking history. She has never used smokeless tobacco. Alcohol:  reports that she drinks alcohol. Drug:  reports that she does not use drugs.  Constitutional Exam  General appearance: Well nourished, well developed, and  well hydrated. In no acute distress Vitals:   10/17/15 0858  BP: 138/74  Pulse: (!) 107  Resp: 18  Temp: 97.8 F (36.6 C)  SpO2: 96%  Weight: 179 lb (81.2 kg)  Height: _0  (1.575 m)  BMI Assessment: Estimated body mass index is 32.74 kg/m as calculated from the following:   Height as of this encounter: _1  (1.575 m).   Weight as of this encounter: 179 lb (81.2 kg).   BMI interpretation: (30-34.9 kg/m2) = Obese (Class I): This range is associated with a 68% higher incidence of chronic pain. BMI Readings  from Last 4 Encounters:  10/17/15 32.74 kg/m  06/06/15 30.73 kg/m  06/06/15 30.55 kg/m  05/23/15 30.90 kg/m   Wt Readings from Last 4 Encounters:  10/17/15 179 lb (81.2 kg)  06/06/15 179 lb (81.2 kg)  06/06/15 178 lb (80.7 kg)  05/23/15 180 lb (81.6 kg)  Psych/Mental status: Alert and oriented x 3 (person, place, & time) Eyes: PERLA Respiratory: No evidence of acute respiratory distress  Cervical Spine Exam  Inspection: No masses, redness, or swelling Alignment: Symmetrical Functional ROM: Unrestricted ROM Stability: No instability detected Muscle strength & Tone: Functionally intact Sensory: Unimpaired Palpation: Non-contributory  Upper Extremity (UE) Exam    Side: Right upper extremity  Side: Left upper extremity  Inspection: No masses, redness, swelling, or asymmetry  Inspection: No masses, redness, swelling, or asymmetry  Functional ROM: Unrestricted ROM         Functional ROM: Unrestricted ROM          Muscle strength & Tone: Functionally intact  Muscle strength & Tone: Functionally intact  Sensory: Unimpaired  Sensory: Unimpaired  Palpation: Non-contributory  Palpation: Non-contributory   Thoracic Spine Exam  Inspection: No masses, redness, or swelling Alignment: Symmetrical Functional ROM: Unrestricted ROM Stability: No instability detected Sensory: Unimpaired Muscle strength & Tone: Functionally intact Palpation: Non-contributory  Lumbar Spine Exam  Inspection: No masses, redness, or swelling Alignment: Symmetrical Functional ROM: Unrestricted ROM Stability: No instability detected Muscle strength & Tone: Functionally intact Sensory: Unimpaired Palpation: Non-contributory Provocative Tests: Lumbar Hyperextension and rotation test: evaluation deferred today       Patrick's Maneuver: evaluation deferred today              Gait & Posture Assessment  Ambulation: Unassisted Gait: Relatively normal for age and body habitus Posture: WNL   Lower Extremity  Exam    Side: Right lower extremity  Side: Left lower extremity  Inspection: No masses, redness, swelling, or asymmetry  Inspection: No masses, redness, swelling, or asymmetry  Functional ROM: Unrestricted ROM          Functional ROM: Unrestricted ROM          Muscle strength & Tone: Functionally intact  Muscle strength & Tone: Functionally intact  Sensory: Unimpaired  Sensory: Unimpaired  Palpation: Non-contributory  Palpation: Non-contributory   Assessment  Primary Diagnosis & Pertinent Problem List: The primary encounter diagnosis was Chronic pain. Diagnoses of Long term current use of opiate analgesic, Opiate use (22.5 MME/Day), Encounter for therapeutic drug level monitoring, Chronic low back pain (Location of Primary Source of Pain) (Bilateral) (R>L), Failed back surgical syndrome, Lumbar facet syndrome (Bilateral) (R>L), Vitamin B12 deficiency, Chronic hip pain, unspecified laterality, and Chronic neck pain (3) (B) (R>L) were also pertinent to this visit.  Visit Diagnosis: 1. Chronic pain   2. Long term current use of opiate analgesic   3. Opiate use (22.5 MME/Day)   4. Encounter for therapeutic drug  level monitoring   5. Chronic low back pain (Location of Primary Source of Pain) (Bilateral) (R>L)   6. Failed back surgical syndrome   7. Lumbar facet syndrome (Bilateral) (R>L)   8. Vitamin B12 deficiency   9. Chronic hip pain, unspecified laterality   10. Chronic neck pain (3) (B) (R>L)    Plan of Care  Pharmacotherapy (Medications Ordered): Meds ordered this encounter  Medications  . oxyCODONE (OXY IR/ROXICODONE) 5 MG immediate release tablet    Sig: Take 0.5 tablets (2.5 mg total) by mouth every 4 (four) hours as needed for severe pain.    Dispense:  90 tablet    Refill:  0    Do not place this medication, or any other prescription from our practice, on "Automatic Refill". Patient may have prescription filled one day early if pharmacy is closed on scheduled refill date. Do  not fill until: 10/17/15 To last until: 11/16/15  . Cyanocobalamin (VITAMELTS ENERGY VITAMIN B-12) 1500 MCG TBDP    Sig: Take 1 tablet by mouth daily.    Dispense:  30 tablet    Refill:  2    Do not place this medication, or any other prescription from our practice, on "Automatic Refill".  . ondansetron (ZOFRAN-ODT) 4 MG disintegrating tablet    Sig: Take 1 tablet (4 mg total) by mouth every 8 (eight) hours as needed for nausea or vomiting.    Dispense:  30 tablet    Refill:  2   New Prescriptions   ONDANSETRON (ZOFRAN-ODT) 4 MG DISINTEGRATING TABLET    Take 1 tablet (4 mg total) by mouth every 8 (eight) hours as needed for nausea or vomiting.   Medications administered during this visit: Ms. San had no medications administered during this visit. Lab-work, Procedure(s), & Referral(s) Ordered: Orders Placed This Encounter  Procedures  . LUMBAR FACET(MEDIAL BRANCH NERVE BLOCK) MBNB  . ToxASSURE Select 13 (MW), Urine   Imaging & Referral(s) Ordered: None  Interventional Therapies: Scheduled:  None at this time.    Considering:  Repeat diagnostic lumbar facet block under fluoroscopic guidance and IV sedation. Possible bilateral lumbar facet radiofrequency ablation Possible diagnostic bilateral sacroiliac joint block under fluoroscopic guidance, with or without sedation.  Possible diagnostic bilateral intra-articular hip joint injection under fluoroscopic guidance, with or without sedation.    PRN Procedures:  For the low back pain, bilateral diagnostic lumbar facet block + diagnostic bilateral sacroiliac joint injection under fluoroscopic guidance with IV sedation.  For the hip pain, diagnostic intra-articular hip joint injection under fluoroscopic guidance, with or without sedation.    Requested PM Follow-up: Return in 4 weeks (on 11/14/2015) for Med-Mgmt, In addition.  Future Appointments Date Time Provider [place]  11/22/2015 9:20 AM Milinda Pointer, MD  [facility] None   Primary Care Physician: Stephanie Short., MD Location: [facility] Note by: Kathlen Brunswick. Dossie Arbour, M.D, DABA, DABAPM, DABPM, DABIPP, FIPP  Pain Score Disclaimer: We use the NRS-11 scale. This is a self-reported, subjective measurement of pain severity with only modest accuracy. It is used primarily to identify changes within a particular patient. It must be understood that outpatient pain scales are significantly less accurate that those used for research, where they can be applied under ideal controlled circumstances with minimal exposure to variables. In reality, the score is likely to be a combination of pain intensity and pain affect, where pain affect describes the degree of emotional arousal or changes in action readiness caused by the sensory experience of pain. Factors  such as social and work situation, setting, emotional state, anxiety levels, expectation, and prior pain experience may influence pain perception and show large inter-individual differences that may also be affected by time variables.  Patient instructions provided during this appointment: Patient Instructions  Please read the Pain Scale handout You were given a prescription for Oxycodone today. Prescriptions for Zofran and Cyanocobalamin were sent electronically to your pharmacy.

## 2015-10-17 NOTE — Progress Notes (Signed)
Safety precautions to be maintained throughout the outpatient stay will include: orient to surroundings, keep bed in low position, maintain call bell within reach at all times, provide assistance with transfer out of bed and ambulation.  Did not bring pain bottles  For pill count.  States she has been out of her medicine for a little over a month.

## 2015-10-24 LAB — TOXASSURE SELECT 13 (MW), URINE

## 2015-11-22 ENCOUNTER — Encounter: Payer: 59 | Admitting: Pain Medicine

## 2015-11-26 ENCOUNTER — Other Ambulatory Visit: Payer: Self-pay | Admitting: Pain Medicine

## 2015-11-26 ENCOUNTER — Ambulatory Visit
Admission: RE | Admit: 2015-11-26 | Discharge: 2015-11-26 | Disposition: A | Discharge status: Home / Self Care | Payer: 59 | Source: Ambulatory Visit | Attending: Pain Medicine | Admitting: Pain Medicine

## 2015-11-26 ENCOUNTER — Encounter: Payer: Self-pay | Admitting: Pain Medicine

## 2015-11-26 ENCOUNTER — Other Ambulatory Visit: Payer: 59

## 2015-11-26 ENCOUNTER — Ambulatory Visit (HOSPITAL_BASED_OUTPATIENT_CLINIC_OR_DEPARTMENT_OTHER): Payer: 59 | Admitting: Pain Medicine

## 2015-11-26 VITALS — BP 143/78 | HR 88 | Temp 96.7°F | Resp 14 | Ht 64.0 in | Wt 182.0 lb

## 2015-11-26 DIAGNOSIS — M545 Low back pain: Secondary | ICD-10-CM | POA: Insufficient documentation

## 2015-11-26 DIAGNOSIS — G894 Chronic pain syndrome: Secondary | ICD-10-CM | POA: Diagnosis not present

## 2015-11-26 DIAGNOSIS — R52 Pain, unspecified: Secondary | ICD-10-CM

## 2015-11-26 DIAGNOSIS — Z888 Allergy status to other drugs, medicaments and biological substances status: Secondary | ICD-10-CM | POA: Diagnosis not present

## 2015-11-26 DIAGNOSIS — Z91013 Allergy to seafood: Secondary | ICD-10-CM | POA: Diagnosis not present

## 2015-11-26 DIAGNOSIS — F1190 Opioid use, unspecified, uncomplicated: Secondary | ICD-10-CM | POA: Diagnosis not present

## 2015-11-26 DIAGNOSIS — Z79891 Long term (current) use of opiate analgesic: Secondary | ICD-10-CM | POA: Insufficient documentation

## 2015-11-26 DIAGNOSIS — M1288 Other specific arthropathies, not elsewhere classified, other specified site: Secondary | ICD-10-CM

## 2015-11-26 DIAGNOSIS — M47816 Spondylosis without myelopathy or radiculopathy, lumbar region: Secondary | ICD-10-CM

## 2015-11-26 DIAGNOSIS — G8929 Other chronic pain: Secondary | ICD-10-CM

## 2015-11-26 DIAGNOSIS — M488X6 Other specified spondylopathies, lumbar region: Secondary | ICD-10-CM | POA: Diagnosis not present

## 2015-11-26 DIAGNOSIS — M25552 Pain in left hip: Secondary | ICD-10-CM | POA: Diagnosis not present

## 2015-11-26 DIAGNOSIS — Z885 Allergy status to narcotic agent status: Secondary | ICD-10-CM | POA: Diagnosis not present

## 2015-11-26 MED ORDER — LACTATED RINGERS IV SOLN: 1000.0000 mL | Freq: Once | INTRAVENOUS | Status: DC

## 2015-11-26 MED ORDER — TRIAMCINOLONE ACETONIDE 40 MG/ML IJ SUSP
INTRAMUSCULAR | Status: AC
  Administered 2015-11-26: 11:00:00
  Filled 2015-11-26: qty 2

## 2015-11-26 MED ORDER — FENTANYL CITRATE (PF) 100 MCG/2ML IJ SOLN: 25.0000 ug | INTRAMUSCULAR | Status: DC | PRN

## 2015-11-26 MED ORDER — MIDAZOLAM HCL 5 MG/5ML IJ SOLN: 1.0000 mg | INTRAMUSCULAR | Status: DC | PRN

## 2015-11-26 MED ORDER — MIDAZOLAM HCL 5 MG/5ML IJ SOLN
INTRAMUSCULAR | Status: AC
  Filled 2015-11-26: qty 5

## 2015-11-26 MED ORDER — ONDANSETRON 4 MG PO TBDP: 4.0000 mg | ORAL_TABLET | Freq: Three times a day (TID) | ORAL | 2 refills | Status: DC | PRN

## 2015-11-26 MED ORDER — TRIAMCINOLONE ACETONIDE 40 MG/ML IJ SUSP: 40.0000 mg | Freq: Once | INTRAMUSCULAR | Status: DC

## 2015-11-26 MED ORDER — ROPIVACAINE HCL 2 MG/ML IJ SOLN
INTRAMUSCULAR | Status: AC
  Administered 2015-11-26: 11:00:00
  Filled 2015-11-26: qty 20

## 2015-11-26 MED ORDER — OXYCODONE HCL 5 MG PO TABS: 2.5000 mg | ORAL_TABLET | ORAL | 0 refills | Status: DC | PRN

## 2015-11-26 MED ORDER — ROPIVACAINE HCL 2 MG/ML IJ SOLN: 9.0000 mL | Freq: Once | INTRAMUSCULAR | Status: DC

## 2015-11-26 MED ORDER — FENTANYL CITRATE (PF) 100 MCG/2ML IJ SOLN
INTRAMUSCULAR | Status: AC
  Filled 2015-11-26: qty 2

## 2015-11-26 MED ORDER — LIDOCAINE HCL (PF) 1 % IJ SOLN
INTRAMUSCULAR | Status: AC
  Administered 2015-11-26: 11:00:00
  Filled 2015-11-26: qty 5

## 2015-11-26 MED ORDER — LIDOCAINE HCL (PF) 1 % IJ SOLN: 10.0000 mL | Freq: Once | INTRAMUSCULAR | Status: DC

## 2015-11-26 NOTE — Progress Notes (Signed)
Patient's Name: Stephanie Short  MRN: [id number]  Referring Provider: Kirk Ruths, MD  DOB: [birth year]  PCP: Kirk Ruths., MD  DOS: 11/26/2015  Note by: Kathlen Brunswick. Dossie Arbour, MD  Service setting: Ambulatory outpatient  Location: [facility] (AMB) Pain Management Facility  Visit type: Procedure  Specialty: Interventional Pain Management  Patient type: Established   Primary Reason for Visit: Interventional Pain Management Treatment. CC: Back Pain (lower, left) and Hip Pain (left)  Procedure:  Anesthesia, Analgesia, Anxiolysis:  Type: Diagnostic Medial Branch Facet Block Region: Lumbar Level: L2, L3, L4, L5, & S1 Medial Branch Level(s) Laterality: Bilateral  Type: Local Anesthesia with Moderate (Conscious) Sedation Local Anesthetic: Lidocaine 1% Route: Intravenous (IV) IV Access: Secured Sedation: Meaningful verbal contact was maintained at all times during the procedure  Indication(s): Analgesia and Anxiety  Indications: 1. Lumbar facet syndrome (Bilateral) (R>L)   2. Lumbar spondylosis   3. Chronic low back pain (Location of Primary Source of Pain) (Bilateral) (R>L)   4. Opiate use (22.5 MME/Day)   5. Chronic pain syndrome    Pain Score: Pre-procedure: 3 /10 Post-procedure: 0-No pain/10  Pre-Procedure Assessment:  Stephanie Short is a 65 y.o. (year old), female patient, seen today for interventional treatment. She  has a past surgical history that includes Back surgery (2013); Hysteroscopy w/D&C (N/A, 08/18/2014); Knee arthroscopy (Right); Colonoscopy w/ polypectomy; and Colonoscopy with propofol (N/A, 03/13/2015).. Her primarily concern today is the Back Pain (lower, left) and Hip Pain (left) The primary encounter diagnosis was Lumbar facet syndrome (Bilateral) (R>L). Diagnoses of Lumbar spondylosis, Chronic low back pain (Location of Primary Source of Pain) (Bilateral) (R>L), Opiate use (22.5 MME/Day), and Chronic pain syndrome were also pertinent to this visit.  Pain Type:  Chronic pain Pain Location: Back (hip) Pain Orientation: Lower, Left (left) Pain Descriptors / Indicators: Aching, Constant Pain Frequency: Constant  Date of Last Visit: 10/17/15 Service Provided on Last Visit: Evaluation  Coagulation Parameters Lab Results  Component Value Date   INR 1.0 09/16/2012   LABPROT 13.3 09/16/2012   PLT 203 06/06/2015   Verification of the correct person, correct site (including marking of site), and correct procedure were performed and confirmed by the patient.  Consent: Before the procedure and under the influence of no sedative(s), amnesic(s), or anxiolytics, the patient was informed of the treatment options, risks and possible complications. To fulfill our ethical and legal obligations, as recommended by the American Medical Association's Code of Ethics, I have informed the patient of my clinical impression; the nature and purpose of the treatment or procedure; the risks, benefits, and possible complications of the intervention; the alternatives, including doing nothing; the risk(s) and benefit(s) of the alternative treatment(s) or procedure(s); and the risk(s) and benefit(s) of doing nothing. The patient was provided information about the general risks and possible complications associated with the procedure. These may include, but are not limited to: failure to achieve desired goals, infection, bleeding, organ or nerve damage, allergic reactions, paralysis, and death. In addition, the patient was informed of those risks and complications associated to Spine-related procedures, such as failure to decrease pain; infection (i.e.: Meningitis, epidural or intraspinal abscess); bleeding (i.e.: epidural hematoma, subarachnoid hemorrhage, or any other type of intraspinal or peri-dural bleeding); organ or nerve damage (i.e.: Any type of peripheral nerve, nerve root, or spinal cord injury) with subsequent damage to sensory, motor, and/or autonomic systems, resulting in  permanent pain, numbness, and/or weakness of one or several areas of the body; allergic reactions; (i.e.: anaphylactic reaction); and/or death.  Furthermore, the patient was informed of those risks and complications associated with the medications. These include, but are not limited to: allergic reactions (i.e.: anaphylactic or anaphylactoid reaction(s)); adrenal axis suppression; blood sugar elevation that in diabetics may result in ketoacidosis or comma; water retention that in patients with history of congestive heart failure may result in shortness of breath, pulmonary edema, and decompensation with resultant heart failure; weight gain; swelling or edema; medication-induced neural toxicity; particulate matter embolism and blood vessel occlusion with resultant organ, and/or nervous system infarction; and/or aseptic necrosis of one or more joints. Finally, the patient was informed that Medicine is not an exact science; therefore, there is also the possibility of unforeseen or unpredictable risks and/or possible complications that may result in a catastrophic outcome. The patient indicated having understood very clearly. We have given the patient no guarantees and we have made no promises. Enough time was given to the patient to ask questions, all of which were answered to the patient's satisfaction. Stephanie Short has indicated that she wanted to continue with the procedure.  Consent Attestation: I, the ordering provider, attest that I have discussed with the patient the benefits, risks, side-effects, alternatives, likelihood of achieving goals, and potential problems during recovery for the procedure that I have provided informed consent.  Pre-Procedure Preparation:  Safety Precautions: Allergies reviewed. The patient was asked about blood thinners, or active infections, both of which were denied. The patient was asked to confirm the procedure and laterality, before marking the site, and again before  commencing the procedure. Appropriate site, procedure, and patient were confirmed by following the Joint Commission's Universal Protocol (UP.01.01.01), in the form of a "Time Out". The patient was asked to participate by confirming the accuracy of the "Time Out" information. Patient was assessed for positional comfort and pressure points before starting the procedure. Allergies: She is allergic to codeine; dilaudid [hydromorphone hcl]; percocet [oxycodone-acetaminophen]; tramadol; gabapentin; hydrocodone-acetaminophen; hydromorphone; oxycodone-acetaminophen; promethazine; and scallops [shellfish allergy]. Allergy Precautions: None required Infection Control Precautions: Sterile technique used. Standard Universal Precautions were taken as recommended by the Department of [facility] for Disease Control and Prevention (CDC). Standard pre-surgical skin prep was conducted. Respiratory hygiene and cough etiquette was practiced. Hand hygiene observed. Safe injection practices and needle disposal techniques followed. SDV (single dose vial) medications used. Medications properly checked for expiration dates and contaminants. Personal protective equipment (PPE) used as per protocol. Monitoring:  As per clinic protocol. Vitals:   11/26/15 1100 11/26/15 1110 11/26/15 1120 11/26/15 1130  BP: 120/90 131/80 131/80 (!) 143/78  Pulse: 90 85 88 88  Resp: _0 Temp:  (!) 96.7 F (35.9 C)    TempSrc:      SpO2: 97% 92% 91% 95%  Weight:      Height:      Calculated BMI: Body mass index is 31.24 kg/m. Time-out: "Time-out" completed before starting procedure, as per protocol.  Description of Procedure Process:   Time-out: "Time-out" completed before starting procedure, as per protocol. Position: Prone Target Area: For Lumbar Facet blocks, the target is the groove formed by the junction of the transverse process and superior articular process. For the L5 dorsal ramus, the target is the notch  between superior articular process and sacral ala. For the S1 dorsal ramus, the target is the superior and lateral edge of the posterior S1 Sacral foramen. Approach: Paramedial approach. Area Prepped: Entire Posterior Lumbosacral Region Prepping solution: ChloraPrep (2% chlorhexidine gluconate and 70% isopropyl alcohol) Safety Precautions: Aspiration looking  for blood return was conducted prior to all injections. At no point did we inject any substances, as a needle was being advanced. No attempts were made at seeking any paresthesias. Safe injection practices and needle disposal techniques used. Medications properly checked for expiration dates. SDV (single dose vial) medications used. Description of the Procedure: Protocol guidelines were followed. The patient was placed in position over the fluoroscopy table. The target area was identified and the area prepped in the usual manner. Skin desensitized using vapocoolant spray. Skin & deeper tissues infiltrated with local anesthetic. Appropriate amount of time allowed to pass for local anesthetics to take effect. The procedure needle was introduced through the skin, ipsilateral to the reported pain, and advanced to the target area. Employing the "Medial Branch Technique", the needles were advanced to the angle made by the superior and medial portion of the transverse process, and the lateral and inferior portion of the superior articulating process of the targeted vertebral bodies. This area is known as "Burton's Eye" or the "Eye of the [place] Dog". A procedure needle was introduced through the skin, and this time advanced to the angle made by the superior and medial border of the sacral ala, and the lateral border of the S1 vertebral body. This last needle was later repositioned at the superior and lateral border of the posterior S1 foramen. Negative aspiration confirmed. Solution injected in intermittent fashion, asking for systemic symptoms every 0.5cc of  injectate. The needles were then removed and the area cleansed, making sure to leave some of the prepping solution back to take advantage of its long term bactericidal properties. EBL: None Materials & Medications Used:  Needle(s) Used: 22g - 3.5" Spinal Needle(s)   Illustration of the posterior view of the lumbar spine and the posterior neural structures. Laminae of L2 through S1 are labeled. DPRL5, dorsal primary ramus of L5; DPRS1, dorsal primary ramus of S1; DPR3, dorsal primary ramus of L3; FJ, facet (zygapophyseal) joint L3-L4; I, inferior articular process of L4; LB1, lateral branch of dorsal primary ramus of L1; IAB, inferior articular branches from L3 medial branch (supplies L4-L5 facet joint); IBP, intermediate branch plexus; MB3, medial branch of dorsal primary ramus of L3; NR3, third lumbar nerve root; S, superior articular process of L5; SAB, superior articular branches from L4 (supplies L4-5 facet joint also); TP3, transverse process of L3.  Imaging Guidance (Spinal):  Type of Imaging Technique: Fluoroscopy Guidance (Spinal) Indication(s): Assistance in needle guidance and placement for procedures requiring needle placement in or near specific anatomical locations not easily accessible without such assistance. Exposure Time: Please see nurses notes. Contrast: None used. Fluoroscopic Guidance: I was personally present during the use of fluoroscopy. "Tunnel Vision Technique" used to obtain the best possible view of the target area. Parallax error corrected before commencing the procedure. "Direction-depth-direction" technique used to introduce the needle under continuous pulsed fluoroscopy. Once target was reached, antero-posterior, oblique, and lateral fluoroscopic projection used confirm needle placement in all planes. Images permanently stored in EMR. Interpretation: No contrast injected. I personally interpreted the imaging intraoperatively. Adequate needle placement confirmed in  multiple planes. Permanent images saved into the patient's record.  Antibiotic Prophylaxis:  Indication(s): No indications identified. Type:  Antibiotics Given (last 72 hours)    None      Post-operative Assessment:  Complications: No immediate post-treatment complications observed by team, or reported by patient. Disposition: The patient tolerated the entire procedure well. A repeat set of vitals were taken after the procedure and the patient was  kept under observation following institutional policy, for this type of procedure. Post-procedural neurological assessment was performed, showing return to baseline, prior to discharge. The patient was provided with post-procedure discharge instructions, including a section on how to identify potential problems. Should any problems arise concerning this procedure, the patient was given instructions to immediately contact us, at any time, without hesitation. In any case, we plan to contact the patient by telephone for a follow-up status report regarding this interventional procedure. Comments:  No additional relevant information.  Plan of Care  Discharge to: Discharge home  Medications ordered for procedure: Meds ordered this encounter  Medications  . lidocaine (PF) (XYLOCAINE) 1 % injection    GARNER, CYNTHIA: cabinet override  . ropivacaine (PF) 2 mg/ml (0.2%) (NAROPIN) 2 MG/ML epidural    GARNER, CYNTHIA: cabinet override  . fentaNYL (SUBLIMAZE) 100 MCG/2ML injection    GARNER, CYNTHIA: cabinet override  . triamcinolone acetonide (KENALOG-40) 40 MG/ML injection    GARNER, CYNTHIA: cabinet override  . midazolam (VERSED) 5 MG/5ML injection    GARNER, CYNTHIA: cabinet override  . fentaNYL (SUBLIMAZE) injection 25-50 mcg    Make sure Narcan is available in the pyxis when using this medication. In the event of respiratory depression (RR< 8/min): Titrate NARCAN (naloxone) in increments of 0.1 to 0.2 mg IV at 2-3 minute intervals, until desired  degree of reversal.  . lactated ringers infusion 1,000 mL  . midazolam (VERSED) 5 MG/5ML injection 1-2 mg    Make sure Flumazenil is available in the pyxis when using this medication. If oversedation occurs, administer 0.2 mg IV over 15 sec. If after 45 sec no response, administer 0.2 mg again over 1 min; may repeat at 1 min intervals; not to exceed 4 doses (1 mg)  . triamcinolone acetonide (KENALOG-40) injection 40 mg  . lidocaine (PF) (XYLOCAINE) 1 % injection 10 mL  . ropivacaine (PF) 2 mg/ml (0.2%) (NAROPIN) epidural 9 mL  . ondansetron (ZOFRAN-ODT) 4 MG disintegrating tablet    Sig: Take 1 tablet (4 mg total) by mouth every 8 (eight) hours as needed for nausea or vomiting.    Dispense:  30 tablet    Refill:  2  . oxyCODONE (OXY IR/ROXICODONE) 5 MG immediate release tablet    Sig: Take 0.5 tablets (2.5 mg total) by mouth every 4 (four) hours as needed for severe pain.    Dispense:  90 tablet    Refill:  0    Do not place this medication, or any other prescription from our practice, on "Automatic Refill". Patient may have prescription filled one day early if pharmacy is closed on scheduled refill date. Do not fill until: 11/26/15 To last until: 12/26/15   Medications administered: (For more details, see medical record) We administered lidocaine (PF), ropivacaine (PF) 2 mg/ml (0.2%), and triamcinolone acetonide. Lab-work, Procedure(s), & Referral(s) Ordered: Orders Placed This Encounter  Procedures  . LUMBAR FACET(MEDIAL BRANCH NERVE BLOCK) MBNB   Imaging Ordered: No results found for this or any previous visit. New Prescriptions   No medications on file   Physician-requested Follow-up:  Return in about 2 weeks (around 12/10/2015) for Post-Procedure evaluation.  Future Appointments Date Time Provider [place]  01/02/2016 1:45 PM Milinda Pointer, MD [facility] None   Primary Care Physician: Kirk Ruths., MD Location: [facility]  Facility Note by: Kathlen Brunswick. Dossie Arbour, M.D, DABA, DABAPM, DABPM, DABIPP, FIPP  Disclaimer:  Medicine is not an exact science. The only guarantee in medicine is that nothing is guaranteed. It  is important to note that the decision to proceed with this intervention was based on the information collected from the patient. The Data and conclusions were drawn from the patient's questionnaire, the interview, and the physical examination. Because the information was provided in large part by the patient, it cannot be guaranteed that it has not been purposely or unconsciously manipulated. Every effort has been made to obtain as much relevant data as possible for this evaluation. It is important to note that the conclusions that lead to this procedure are derived in large part from the available data. Always take into account that the treatment will also be dependent on availability of resources and existing treatment guidelines, considered by other Pain Management Practitioners as being common knowledge and practice, at the time of the intervention. For Medico-Legal purposes, it is also important to point out that variation in procedural techniques and pharmacological choices are the acceptable norm. The indications, contraindications, technique, and results of the above procedure should only be interpreted and judged by a Board-Certified Interventional Pain Specialist with extensive familiarity and expertise in the same exact procedure and technique. Attempts at providing opinions without similar or greater experience and expertise than that of the treating physician will be considered as inappropriate and unethical, and shall result in a formal complaint to the state medical board and applicable specialty societies.  Instructions provided at this appointment: Patient Instructions  Pain Management Discharge Instructions  General Discharge Instructions :  If you need to reach your doctor call: Monday-Friday  8:00 am - 4:00 pm at [phone] or toll free [phone].  After clinic hours [phone] to have operator reach doctor.  Bring all of your medication bottles to all your appointments in the pain clinic.  To cancel or reschedule your appointment with Pain Management please remember to call 24 hours in advance to avoid a fee.  Refer to the educational materials which you have been given on: General Risks, I had my Procedure. Discharge Instructions, Post Sedation.  Post Procedure Instructions:  The drugs you were given will stay in your system until tomorrow, so for the next 24 hours you should not drive, make any legal decisions or drink any alcoholic beverages.  You may eat anything you prefer, but it is better to start with liquids then soups and crackers, and gradually work up to solid foods.  Please notify your doctor immediately if you have any unusual bleeding, trouble breathing or pain that is not related to your normal pain.  Depending on the type of procedure that was done, some parts of your body may feel week and/or numb.  This usually clears up by tonight or the next day.  Walk with the use of an assistive device or accompanied by an adult for the 24 hours.  You may use ice on the affected area for the first 24 hours.  Put ice in a Ziploc bag and cover with a towel and place against area 15 minutes on 15 minutes off.  You may switch to heat after 24 hours.Facet Joint Block, Care After Refer to this sheet in the next few weeks. These instructions provide you with information on caring for yourself after your procedure. Your health care provider may also give you more specific instructions. Your treatment has been planned according to current medical practices, but problems sometimes occur. Call your health care provider if you have any problems or questions after your procedure. HOME CARE INSTRUCTIONS   Keep track of the amount of pain relief  you feel and how long it  lasts.  Limit pain medicine within the first 4-6 hours after the procedure as directed by your health care provider.  Resume taking dietary supplements and medicines as directed by your health care provider.  You may resume your regular diet.  Do not apply heat near or over the injection site(s) for 24 hours.   Do not take a bath or soak in water (such as a pool or lake) for 24 hours.  Do not drive for 24 hours unless approved by your health care provider.  Avoid strenuous activity for 24 hours.  Remove your bandages the morning after the procedure.   If the injection site is tender, applying an ice pack may relieve some tenderness. To do this:  Put ice in a bag.  Place a towel between your skin and the bag.  Leave the ice on for 15-20 minutes, 3-4 times a day.  Keep follow-up appointments as directed by your health care provider. SEEK MEDICAL CARE IF:   Your pain is not controlled by your medicines.   There is drainage from the injection site.   There is significant bleeding or swelling at the injection site.  You have diabetes and your blood sugar is above 180 mg/dL. SEEK IMMEDIATE MEDICAL CARE IF:   You develop a fever of 101F (38.3C) or greater.   You have worsening pain or swelling around the injection site.   You have red streaking around the injection site.   You develop severe pain that is not controlled by your medicines.   You develop a headache, stiff neck, nausea, or vomiting.   Your eyes become very sensitive to light.   You have weakness, paralysis, or tingling in your arms or legs that was not present before the procedure.   You develop difficulty urinating or breathing.    This information is not intended to replace advice given to you by your health care provider. Make sure you discuss any questions you have with your health care provider.   Document Released: 12/24/2011 Document Revised: 01/27/2014 Document Reviewed:  12/24/2011 Elsevier Interactive Patient Education 2016 [employer] Facet Joint Block The facet joints connect the bones of the spine (vertebrae). They make it possible for you to bend, twist, and make other movements with your spine. They also prevent you from overbending, overtwisting, and making other excessive movements.  A facet joint block is a procedure where a numbing medicine (anesthetic) is injected into a facet joint. Often, a type of anti-inflammatory medicine called a steroid is also injected. A facet joint block may be done for two reasons:   Diagnosis. A facet joint block may be done as a test to see whether neck or back pain is caused by a worn-down or infected facet joint. If the pain gets better after a facet joint block, it means the pain is probably coming from the facet joint. If the pain does not get better, it means the pain is probably not coming from the facet joint.   Therapy. A facet joint block may be done to relieve neck or back pain caused by a facet joint. A facet joint block is only done as a therapy if the pain does not improve with medicine, exercise programs, physical therapy, and other forms of pain management. LET [facility] CARE PROVIDER KNOW ABOUT:   Any allergies you have.   All medicines you are taking, including vitamins, herbs, eyedrops, and over-the-counter medicines and creams.   Previous problems  you or members of your family have had with the use of anesthetics.   Any blood disorders you have had.   Other health problems you have. RISKS AND COMPLICATIONS Generally, having a facet joint block is safe. However, as with any procedure, complications can occur. Possible complications associated with having a facet joint block include:   Bleeding.   Injury to a nerve near the injection site.   Pain at the injection site.   Weakness or numbness in areas controlled by nerves near the injection site.   Infection.   Temporary fluid  retention.   Allergic reaction to anesthetics or medicines used during the procedure. BEFORE THE PROCEDURE   Follow your health care provider's instructions if you are taking dietary supplements or medicines. You may need to stop taking them or reduce your dosage.   Do not take any new dietary supplements or medicines without asking your health care provider first.   Follow your health care provider's instructions about eating and drinking before the procedure. You may need to stop eating and drinking several hours before the procedure.   Arrange to have an adult drive you home after the procedure. PROCEDURE  You may need to remove your clothing and dress in an open-back gown so that your health care provider can access your spine.   The procedure will be done while you are lying on an X-ray table. Most of the time you will be asked to lie on your stomach, but you may be asked to lie in a different position if an injection will be made in your neck.   Special machines will be used to monitor your oxygen levels, heart rate, and blood pressure.   If an injection will be made in your neck, an intravenous (IV) tube will be inserted into one of your veins. Fluids and medicine will flow directly into your body through the IV tube.   The area over the facet joint where the injection will be made will be cleaned with an antiseptic soap. The surrounding skin will be covered with sterile drapes.   An anesthetic will be applied to your skin to make the injection area numb. You may feel a temporary stinging or burning sensation.   A video X-ray machine will be used to locate the joint. A contrast dye may be injected into the facet joint area to help with locating the joint.   When the joint is located, an anesthetic medicine will be injected into the joint through the needle.   Your health care provider will ask you whether you feel pain relief. If you do feel relief, a steroid may be  injected to provide pain relief for a longer period of time. If you do not feel relief or feel only partial relief, additional injections of an anesthetic may be made in other facet joints.   The needle will be removed, the skin will be cleansed, and bandages will be applied.  AFTER THE PROCEDURE   You will be observed for 15-30 minutes before being allowed to go home. Do not drive. Have an adult drive you or take a taxi or public transportation instead.   If you feel pain relief, the pain will return in several hours or days when the anesthetic wears off.   You may feel pain relief 2-14 days after the procedure. The amount of time this relief lasts varies from person to person.   It is normal to feel some tenderness over the injected area(s) for  2 days following the procedure.   If you have diabetes, you may have a temporary increase in blood sugar.   This information is not intended to replace advice given to you by your health care provider. Make sure you discuss any questions you have with your health care provider.   Document Released: 05/28/2006 Document Revised: 01/27/2014 Document Reviewed: 10/27/2011 Elsevier Interactive Patient Education [employer].

## 2015-11-26 NOTE — Patient Instructions (Signed)
Pain Management Discharge Instructions  General Discharge Instructions :  If you need to reach your doctor call: Monday-Friday 8:00 am - 4:00 pm at [phone] or toll free [phone].  After clinic hours [phone] to have operator reach doctor.  Bring all of your medication bottles to all your appointments in the pain clinic.  To cancel or reschedule your appointment with Pain Management please remember to call 24 hours in advance to avoid a fee.  Refer to the educational materials which you have been given on: General Risks, I had my Procedure. Discharge Instructions, Post Sedation.  Post Procedure Instructions:  The drugs you were given will stay in your system until tomorrow, so for the next 24 hours you should not drive, make any legal decisions or drink any alcoholic beverages.  You may eat anything you prefer, but it is better to start with liquids then soups and crackers, and gradually work up to solid foods.  Please notify your doctor immediately if you have any unusual bleeding, trouble breathing or pain that is not related to your normal pain.  Depending on the type of procedure that was done, some parts of your body may feel week and/or numb.  This usually clears up by tonight or the next day.  Walk with the use of an assistive device or accompanied by an adult for the 24 hours.  You may use ice on the affected area for the first 24 hours.  Put ice in a Ziploc bag and cover with a towel and place against area 15 minutes on 15 minutes off.  You may switch to heat after 24 hours.Facet Joint Block, Care After Refer to this sheet in the next few weeks. These instructions provide you with information on caring for yourself after your procedure. Your health care provider may also give you more specific instructions. Your treatment has been planned according to current medical practices, but problems sometimes occur. Call your health care provider if you have any problems or  questions after your procedure. HOME CARE INSTRUCTIONS   Keep track of the amount of pain relief you feel and how long it lasts.  Limit pain medicine within the first 4-6 hours after the procedure as directed by your health care provider.  Resume taking dietary supplements and medicines as directed by your health care provider.  You may resume your regular diet.  Do not apply heat near or over the injection site(s) for 24 hours.   Do not take a bath or soak in water (such as a pool or lake) for 24 hours.  Do not drive for 24 hours unless approved by your health care provider.  Avoid strenuous activity for 24 hours.  Remove your bandages the morning after the procedure.   If the injection site is tender, applying an ice pack may relieve some tenderness. To do this:  Put ice in a bag.  Place a towel between your skin and the bag.  Leave the ice on for 15-20 minutes, 3-4 times a day.  Keep follow-up appointments as directed by your health care provider. SEEK MEDICAL CARE IF:   Your pain is not controlled by your medicines.   There is drainage from the injection site.   There is significant bleeding or swelling at the injection site.  You have diabetes and your blood sugar is above 180 mg/dL. SEEK IMMEDIATE MEDICAL CARE IF:   You develop a fever of 101F (38.3C) or greater.   You have worsening pain or swelling around  the injection site.   You have red streaking around the injection site.   You develop severe pain that is not controlled by your medicines.   You develop a headache, stiff neck, nausea, or vomiting.   Your eyes become very sensitive to light.   You have weakness, paralysis, or tingling in your arms or legs that was not present before the procedure.   You develop difficulty urinating or breathing.    This information is not intended to replace advice given to you by your health care provider. Make sure you discuss any questions you have  with your health care provider.   Document Released: 12/24/2011 Document Revised: 01/27/2014 Document Reviewed: 12/24/2011 Elsevier Interactive Patient Education 2016 [employer] Facet Joint Block The facet joints connect the bones of the spine (vertebrae). They make it possible for you to bend, twist, and make other movements with your spine. They also prevent you from overbending, overtwisting, and making other excessive movements.  A facet joint block is a procedure where a numbing medicine (anesthetic) is injected into a facet joint. Often, a type of anti-inflammatory medicine called a steroid is also injected. A facet joint block may be done for two reasons:   Diagnosis. A facet joint block may be done as a test to see whether neck or back pain is caused by a worn-down or infected facet joint. If the pain gets better after a facet joint block, it means the pain is probably coming from the facet joint. If the pain does not get better, it means the pain is probably not coming from the facet joint.   Therapy. A facet joint block may be done to relieve neck or back pain caused by a facet joint. A facet joint block is only done as a therapy if the pain does not improve with medicine, exercise programs, physical therapy, and other forms of pain management. LET [facility] CARE PROVIDER KNOW ABOUT:   Any allergies you have.   All medicines you are taking, including vitamins, herbs, eyedrops, and over-the-counter medicines and creams.   Previous problems you or members of your family have had with the use of anesthetics.   Any blood disorders you have had.   Other health problems you have. RISKS AND COMPLICATIONS Generally, having a facet joint block is safe. However, as with any procedure, complications can occur. Possible complications associated with having a facet joint block include:   Bleeding.   Injury to a nerve near the injection site.   Pain at the injection site.    Weakness or numbness in areas controlled by nerves near the injection site.   Infection.   Temporary fluid retention.   Allergic reaction to anesthetics or medicines used during the procedure. BEFORE THE PROCEDURE   Follow your health care provider's instructions if you are taking dietary supplements or medicines. You may need to stop taking them or reduce your dosage.   Do not take any new dietary supplements or medicines without asking your health care provider first.   Follow your health care provider's instructions about eating and drinking before the procedure. You may need to stop eating and drinking several hours before the procedure.   Arrange to have an adult drive you home after the procedure. PROCEDURE  You may need to remove your clothing and dress in an open-back gown so that your health care provider can access your spine.   The procedure will be done while you are lying on an X-ray table. Most  of the time you will be asked to lie on your stomach, but you may be asked to lie in a different position if an injection will be made in your neck.   Special machines will be used to monitor your oxygen levels, heart rate, and blood pressure.   If an injection will be made in your neck, an intravenous (IV) tube will be inserted into one of your veins. Fluids and medicine will flow directly into your body through the IV tube.   The area over the facet joint where the injection will be made will be cleaned with an antiseptic soap. The surrounding skin will be covered with sterile drapes.   An anesthetic will be applied to your skin to make the injection area numb. You may feel a temporary stinging or burning sensation.   A video X-ray machine will be used to locate the joint. A contrast dye may be injected into the facet joint area to help with locating the joint.   When the joint is located, an anesthetic medicine will be injected into the joint through the  needle.   Your health care provider will ask you whether you feel pain relief. If you do feel relief, a steroid may be injected to provide pain relief for a longer period of time. If you do not feel relief or feel only partial relief, additional injections of an anesthetic may be made in other facet joints.   The needle will be removed, the skin will be cleansed, and bandages will be applied.  AFTER THE PROCEDURE   You will be observed for 15-30 minutes before being allowed to go home. Do not drive. Have an adult drive you or take a taxi or public transportation instead.   If you feel pain relief, the pain will return in several hours or days when the anesthetic wears off.   You may feel pain relief 2-14 days after the procedure. The amount of time this relief lasts varies from person to person.   It is normal to feel some tenderness over the injected area(s) for 2 days following the procedure.   If you have diabetes, you may have a temporary increase in blood sugar.   This information is not intended to replace advice given to you by your health care provider. Make sure you discuss any questions you have with your health care provider.   Document Released: 05/28/2006 Document Revised: 01/27/2014 Document Reviewed: 10/27/2011 Elsevier Interactive Patient Education [employer].

## 2015-11-27 ENCOUNTER — Telehealth: Payer: Self-pay | Admitting: *Deleted

## 2015-12-21 ENCOUNTER — Inpatient Hospital Stay: Payer: 59

## 2015-12-21 ENCOUNTER — Encounter (INDEPENDENT_AMBULATORY_CARE_PROVIDER_SITE_OTHER): Payer: Self-pay

## 2015-12-21 ENCOUNTER — Inpatient Hospital Stay: Payer: 59 | Attending: Hematology and Oncology | Admitting: Hematology and Oncology

## 2015-12-21 VITALS — BP 132/84 | HR 103 | Temp 96.7°F | Resp 18 | Ht 64.17 in | Wt 181.2 lb

## 2015-12-21 DIAGNOSIS — R0602 Shortness of breath: Secondary | ICD-10-CM | POA: Diagnosis not present

## 2015-12-21 DIAGNOSIS — I10 Essential (primary) hypertension: Secondary | ICD-10-CM | POA: Diagnosis not present

## 2015-12-21 DIAGNOSIS — M1712 Unilateral primary osteoarthritis, left knee: Secondary | ICD-10-CM | POA: Diagnosis not present

## 2015-12-21 DIAGNOSIS — Z79899 Other long term (current) drug therapy: Secondary | ICD-10-CM | POA: Insufficient documentation

## 2015-12-21 DIAGNOSIS — E119 Type 2 diabetes mellitus without complications: Secondary | ICD-10-CM | POA: Insufficient documentation

## 2015-12-21 DIAGNOSIS — F329 Major depressive disorder, single episode, unspecified: Secondary | ICD-10-CM | POA: Diagnosis not present

## 2015-12-21 DIAGNOSIS — Z8619 Personal history of other infectious and parasitic diseases: Secondary | ICD-10-CM | POA: Diagnosis not present

## 2015-12-21 DIAGNOSIS — J45909 Unspecified asthma, uncomplicated: Secondary | ICD-10-CM

## 2015-12-21 DIAGNOSIS — R51 Headache: Secondary | ICD-10-CM | POA: Insufficient documentation

## 2015-12-21 DIAGNOSIS — G4700 Insomnia, unspecified: Secondary | ICD-10-CM | POA: Diagnosis not present

## 2015-12-21 DIAGNOSIS — D472 Monoclonal gammopathy: Secondary | ICD-10-CM | POA: Diagnosis present

## 2015-12-21 DIAGNOSIS — Z800 Family history of malignant neoplasm of digestive organs: Secondary | ICD-10-CM | POA: Diagnosis not present

## 2015-12-21 DIAGNOSIS — Z87891 Personal history of nicotine dependence: Secondary | ICD-10-CM | POA: Insufficient documentation

## 2015-12-21 LAB — COMPREHENSIVE METABOLIC PANEL
ALT: 15 U/L (ref 14–54)
AST: 25 U/L (ref 15–41)
Albumin: 4.3 g/dL (ref 3.5–5.0)
Alkaline Phosphatase: 84 U/L (ref 38–126)
Anion gap: 9 (ref 5–15)
BUN: 23 mg/dL — ABNORMAL HIGH (ref 6–20)
CO2: 24 mmol/L (ref 22–32)
Calcium: 10.1 mg/dL (ref 8.9–10.3)
Chloride: 105 mmol/L (ref 101–111)
Creatinine, Ser: 0.79 mg/dL (ref 0.44–1.00)
GFR calc Af Amer: >60 mL/min (ref >60)
GFR calc non Af Amer: >60 mL/min (ref >60)
Glucose, Bld: 173 mg/dL — ABNORMAL HIGH (ref 65–99)
Potassium: 3.7 mmol/L (ref 3.5–5.1)
Sodium: 138 mmol/L (ref 135–145)
Total Bilirubin: 0.3 mg/dL (ref 0.3–1.2)
Total Protein: 8.8 g/dL — ABNORMAL HIGH (ref 6.5–8.1)

## 2015-12-21 LAB — CBC WITH DIFFERENTIAL/PLATELET
Basophils Absolute: 0 10*3/uL (ref 0–0.1)
Basophils Relative: 1 %
Eosinophils Absolute: 0 10*3/uL (ref 0–0.7)
Eosinophils Relative: 1 %
HCT: 40.4 % (ref 35.0–47.0)
Hemoglobin: 13.5 g/dL (ref 12.0–16.0)
Lymphocytes Relative: 19 %
Lymphs Abs: 1.3 10*3/uL (ref 1.0–3.6)
MCH: 26.2 pg (ref 26.0–34.0)
MCHC: 33.4 g/dL (ref 32.0–36.0)
MCV: 78.4 fL — ABNORMAL LOW (ref 80.0–100.0)
Monocytes Absolute: 0.5 10*3/uL (ref 0.2–0.9)
Monocytes Relative: 8 %
Neutro Abs: 5.1 10*3/uL (ref 1.4–6.5)
Neutrophils Relative %: 73 %
Platelets: 243 10*3/uL (ref 150–440)
RBC: 5.15 MIL/uL (ref 3.80–5.20)
RDW: 16.5 % — ABNORMAL HIGH (ref 11.5–14.5)
WBC: 7.1 10*3/uL (ref 3.6–11.0)

## 2015-12-21 NOTE — Progress Notes (Signed)
[facility] day:  12/21/2015  Chief Complaint: Stephanie Short is a 65 y.o. female with an elevated immune protein who is referred in consultation by Dr. Frazier Richards for assessment and management.  HPI: The patient presented with a 2 week history of off and on headaches.  Screening labs were drawn.  Serum protein was elevated at 8.5.  SPEP on 11/29/2015 revealed a 0.4 g/dL IgG monoclonal protein with kappa light chain specificity.  Creatinine was 0.9, albumen 4.8, and calcium 10.4 (8.7-10.3).  Symptomatically, she denies any fever sweats or weight loss. Her energy level is good sometimes. She describes being depressed a lot. She has some shortness of breath with exertion, but this is not new. She denies any GI symptoms. She is up-to-date on her colonoscopy.  She denies any bone pain. She has some left knee arthritis. She has had no issues with recurrent infections.   Past Medical History:  Diagnosis Date  . Asthma   . Depression   . Diabetes mellitus   . Hepatitis C    Dr. Staci Short pt took part in a study, now cured  . Hypertension   . Insomnia     Past Surgical History:  Procedure Laterality Date  . BACK SURGERY  2013   [facility] Dr. Mauri Short  . COLONOSCOPY W/ POLYPECTOMY    . COLONOSCOPY WITH PROPOFOL N/A 03/13/2015   Procedure: COLONOSCOPY WITH PROPOFOL;  Surgeon: Stephanie Sails, MD;  [facility];  Service: Endoscopy;  Laterality: N/A;  . HYSTEROSCOPY W/D&C N/A 08/18/2014   Procedure: DILATATION AND CURETTAGE /HYSTEROSCOPY;  Surgeon: Stephanie Ang, MD;  Location: [facility] ORS;  Service: Gynecology;  Laterality: N/A;  . KNEE ARTHROSCOPY Right     Family History  Problem Relation Age of Onset  . Coronary artery disease Mother 41  . Coronary artery disease Brother 77  . Heart attack Brother     MI  . Heart disease Father   . Diabetes Father   . Diabetes Other   . Hypertension Other   . Colon cancer Other   Nephew  died at the age of 24 with colon cancer.  Social History:  reports that she quit smoking about 11 years ago. She has a 20.00 pack-year smoking history. She has never used smokeless tobacco. She reports that she drinks alcohol. She reports that she does not use drugs.  The patient is accompanied by her husband, Stephanie Short, today.  Allergies:  Allergies  Allergen Reactions  . Codeine Nausea And Vomiting  . Dilaudid [Hydromorphone Hcl] Nausea And Vomiting  . Percocet [Oxycodone-Acetaminophen] Nausea And Vomiting and Nausea Only  . Tramadol Nausea And Vomiting and Nausea Only  . Gabapentin Nausea And Vomiting and Other (See Comments)    Reaction:  Dizziness and GI upset  . Hydrocodone-Acetaminophen Nausea And Vomiting and Nausea Only  . Hydromorphone Nausea Only  . Oxycodone-Acetaminophen Nausea Only  . Promethazine Other (See Comments)    Other reaction(s): Other (See Comments) Reaction:  GI upset  Reaction:  GI upset   . Scallops [Shellfish Allergy] Nausea And Vomiting    Other reaction(s): Nausea And Vomiting    Current Medications: Current Outpatient Prescriptions  Medication Sig Dispense Refill  . acetaminophen (TYLENOL) 500 MG tablet Take 1,000-1,500 mg by mouth every 6 (six) hours as needed for mild pain.    Marland Kitchen albuterol (PROVENTIL HFA;VENTOLIN HFA) 108 (90 Base) MCG/ACT inhaler Inhale 2 puffs into the lungs every 6 (six) hours as needed for  wheezing or shortness of breath.    . ALPRAZolam (XANAX) 0.25 MG tablet Take 0.125-0.25 mg by mouth 2 (two) times daily as needed for anxiety or sleep.    . Ascorbic Acid (VITAMIN C PO) Take 1 tablet by mouth at bedtime.    Marland Kitchen aspirin EC 81 MG tablet Take 81 mg by mouth at bedtime.    . Cyanocobalamin (VITAMELTS ENERGY VITAMIN B-12) 1500 MCG TBDP Take 1 tablet by mouth daily. 30 tablet 2  . felodipine (PLENDIL) 10 MG 24 hr tablet Take 10 mg by mouth at bedtime.    . metFORMIN (GLUCOPHAGE-XR) 500 MG 24 hr tablet Take 1,000 mg by mouth every  evening.    . ondansetron (ZOFRAN-ODT) 4 MG disintegrating tablet Take 1 tablet (4 mg total) by mouth every 8 (eight) hours as needed for nausea or vomiting. 30 tablet 2  . oxyCODONE (OXY IR/ROXICODONE) 5 MG immediate release tablet Take 0.5 tablets (2.5 mg total) by mouth every 4 (four) hours as needed for severe pain. 90 tablet 0  . pantoprazole (PROTONIX) 40 MG tablet Take 40 mg by mouth daily.  3  . ranitidine (ZANTAC) 150 MG capsule Take 150 mg by mouth 2 (two) times daily.  5  . ranitidine (ZANTAC) 150 MG tablet TAKE 1 TABLET (150 MG TOTAL) BY MOUTH 2 (TWO) TIMES DAILY. AS NEEDED  11  . spironolactone (ALDACTONE) 25 MG tablet Take 25 mg by mouth daily.     Marland Kitchen zolpidem (AMBIEN) 10 MG tablet Take 5-10 mg by mouth at bedtime as needed for sleep.    Marland Kitchen zolpidem (AMBIEN) 5 MG tablet TAKE 1/2-1 TABLET BY MOUTH AT BEDTIME AS NEEDED FOR SLEEP  0   Current Facility-Administered Medications  Medication Dose Route Frequency Provider Last Rate Last Dose  . fentaNYL (SUBLIMAZE) injection 25-50 mcg  25-50 mcg Intravenous Q5 min PRN Milinda Pointer, MD      . lactated ringers infusion 1,000 mL  1,000 mL Intravenous Once Milinda Pointer, MD      . lidocaine (PF) (XYLOCAINE) 1 % injection 10 mL  10 mL Other Once Milinda Pointer, MD      . midazolam (VERSED) 5 MG/5ML injection 1-2 mg  1-2 mg Intravenous Q5 min PRN Milinda Pointer, MD      . ropivacaine (PF) 2 mg/ml (0.2%) (NAROPIN) epidural 9 mL  9 mL Other Once Milinda Pointer, MD      . triamcinolone acetonide (KENALOG-40) injection 40 mg  40 mg Other Once Milinda Pointer, MD        Review of Systems:  GENERAL:  Energy level is good sometimes.  No fevers, sweats or weight loss. PERFORMANCE STATUS (ECOG):  1 HEENT:  No visual changes, runny nose, sore throat, mouth sores or tenderness. Lungs: Shortness of breath with exertion.  No cough.  No hemoptysis. Cardiac:  No chest pain, palpitations, orthopnea, or PND. GI:  No nausea, vomiting,  diarrhea, constipation, melena or hematochezia. Up-to-date on colonoscopy. GU:  No urgency, frequency, dysuria, or hematuria.  UTI 1 month ago. Musculoskeletal:  h/o back surgery.  Left knee arthritis.  No muscle tenderness. Extremities:  No pain or swelling. Skin:  No rashes or skin changes. Neuro:  Off and on headaches for 2 weeks.  No numbness or weakness, balance or coordination issues. Endocrine:  No diabetes, thyroid issues, hot flashes or night sweats. Psych:  Depression.  No anxiety. Pain:  No focal pain. Review of systems:  All other systems reviewed and found to be negative.  Physical Exam: Blood pressure 132/84, pulse (!) 103, temperature 96.7 F (35.9 C), temperature source Tympanic, resp. rate 18, weight 181 lb 3.5 oz (82.2 kg). GENERAL:  Well developed, well nourished, woman sitting comfortably in the exam room in no acute distress. MENTAL STATUS:  Alert and oriented to person, place and time. HEAD:  Wearing a black wrap.  Brown hair.  Normocephalic, atraumatic, face symmetric, no Cushingoid features. EYES:  Glasses. Brown eyes.  Pupils equal round and reactive to light and accomodation.  No conjunctivitis or scleral icterus. ENT:  Oropharynx clear without lesion.  Tongue normal.  Dentures.  Mucous membranes moist.  RESPIRATORY:  Clear to auscultation without rales, wheezes or rhonchi. CARDIOVASCULAR:  Regular rate and rhythm without murmur, rub or gallop. ABDOMEN:  Soft, non-tender, with active bowel sounds, and no hepatosplenomegaly.  No masses. SKIN:  No rashes, ulcers or lesions. EXTREMITIES: No edema, no skin discoloration or tenderness.  No palpable cords. LYMPH NODES: No palpable cervical, supraclavicular, axillary or inguinal adenopathy  NEUROLOGICAL: Unremarkable. PSYCH:  Appropriate.   No visits with results within 3 Day(s) from this visit.  Latest known visit with results is:  Clinical Support on 10/17/2015  Component Date Value Ref Range Status  . ToxAssure  Select 13 10/24/2015 FINAL   Final   Comment: ==================================================================== TOXASSURE SELECT 13 (MW) ==================================================================== Test                             Result       Flag       Units Drug Absent but Declared for Prescription Verification   Alprazolam                     Not Detected UNEXPECTED ng/mg creat   Oxycodone                      Not Detected UNEXPECTED ng/mg creat ==================================================================== Test                      Result    Flag   Units      Ref Range   Creatinine              133              mg/dL      >=20 ==================================================================== Declared Medications:  The flagging and interpretation on this report are based on the  following declared medications.  Unexpected results may arise from  inaccuracies in the declared medications.  **Note: The testing scope of this panel includes these medications:  Alprazolam (Xanax)  Oxycodone  **N                          ote: The testing scope of this panel does not include following  reported medications:  Acetaminophen (Tylenol)  Albuterol (Proventil)  Aspirin (Aspirin 81)  Cyanocobalamin  Felodipine  Metformin  Ondansetron  Pantoprazole  Ranitidine  Spironolactone  Vitamin C  Zolpidem ==================================================================== For clinical consultation, please call [phone]. ====================================================================     Assessment:  COLLENE MASSIMINO is a 65 y.o. 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  SPEP on 11/29/2015 revealed a 0.4 g/dL IgG monoclonal protein with kappa light chain specificity.  Creatinine was 0.9, albumen 4.8, and calcium 10.4 (8.7-10.3).  Symptomatically, she has had an off and on headache.  She denies any  bone pain or recurrent infections.  Exam is  normal.  Plan: 1.  Discuss monoclonal protein  Differential includes monoclonal gammopathy of unknown significance (MGUS) and lymphoproliferative disorders.  Suspect MGUS. Discuss work-up.  Discuss risk of transformation to a lymphoproliferative disorder is 1%/year and requires regular follow-up if initial work-up is negative. 2.  Labs today: CBC with diff, CMP, myeloma panel, FLCA, beta 2-microglobulin. 3.  Collect 24 hour urine for UPEP and free light chains. 4.  Bone survey:  r/o lytic lesions. 5.  RTC in 1-2 weeks for MD assessment and review of work-up   Lequita Asal, MD  12/21/2015

## 2015-12-22 LAB — BETA 2 MICROGLOBULIN, SERUM: Beta-2 Microglobulin: 1.9 mg/L (ref 0.6–2.4)

## 2015-12-24 LAB — KAPPA/LAMBDA LIGHT CHAINS
Kappa free light chain: 24.7 mg/L — ABNORMAL HIGH (ref 3.3–19.4)
Kappa, lambda light chain ratio: 1.41 (ref 0.26–1.65)
Lambda free light chains: 17.5 mg/L (ref 5.7–26.3)

## 2015-12-25 LAB — MULTIPLE MYELOMA PANEL, SERUM
Albumin SerPl Elph-Mcnc: 4.1 g/dL (ref 2.9–4.4)
Albumin/Glob SerPl: 1.1 (ref 0.7–1.7)
Alpha 1: 0.2 g/dL (ref 0.0–0.4)
Alpha2 Glob SerPl Elph-Mcnc: 0.9 g/dL (ref 0.4–1.0)
B-Globulin SerPl Elph-Mcnc: 1.3 g/dL (ref 0.7–1.3)
Gamma Glob SerPl Elph-Mcnc: 1.6 g/dL (ref 0.4–1.8)
Globulin, Total: 4 g/dL — ABNORMAL HIGH (ref 2.2–3.9)
IgA: 311 mg/dL (ref 87–352)
IgG (Immunoglobin G), Serum: 1500 mg/dL (ref 700–1600)
IgM, Serum: 181 mg/dL (ref 26–217)
M Protein SerPl Elph-Mcnc: 0.6 g/dL — ABNORMAL HIGH
Total Protein ELP: 8.1 g/dL (ref 6.0–8.5)

## 2015-12-27 DIAGNOSIS — D472 Monoclonal gammopathy: Secondary | ICD-10-CM | POA: Diagnosis not present

## 2015-12-28 ENCOUNTER — Inpatient Hospital Stay (HOSPITAL_BASED_OUTPATIENT_CLINIC_OR_DEPARTMENT_OTHER): Payer: 59 | Admitting: Hematology and Oncology

## 2015-12-28 ENCOUNTER — Ambulatory Visit
Admission: RE | Admit: 2015-12-28 | Discharge: 2015-12-28 | Disposition: A | Discharge status: Home / Self Care | Payer: 59 | Source: Ambulatory Visit | Attending: Hematology and Oncology | Admitting: Hematology and Oncology

## 2015-12-28 ENCOUNTER — Other Ambulatory Visit: Payer: Self-pay

## 2015-12-28 VITALS — BP 130/86 | HR 103 | Temp 97.5°F | Resp 18 | Wt 184.5 lb

## 2015-12-28 DIAGNOSIS — R0602 Shortness of breath: Secondary | ICD-10-CM | POA: Diagnosis not present

## 2015-12-28 DIAGNOSIS — G4700 Insomnia, unspecified: Secondary | ICD-10-CM | POA: Diagnosis not present

## 2015-12-28 DIAGNOSIS — Z8619 Personal history of other infectious and parasitic diseases: Secondary | ICD-10-CM

## 2015-12-28 DIAGNOSIS — R51 Headache: Secondary | ICD-10-CM

## 2015-12-28 DIAGNOSIS — Z79899 Other long term (current) drug therapy: Secondary | ICD-10-CM | POA: Diagnosis not present

## 2015-12-28 DIAGNOSIS — D472 Monoclonal gammopathy: Secondary | ICD-10-CM

## 2015-12-28 DIAGNOSIS — F329 Major depressive disorder, single episode, unspecified: Secondary | ICD-10-CM | POA: Diagnosis not present

## 2015-12-28 DIAGNOSIS — Z800 Family history of malignant neoplasm of digestive organs: Secondary | ICD-10-CM

## 2015-12-28 DIAGNOSIS — M1712 Unilateral primary osteoarthritis, left knee: Secondary | ICD-10-CM

## 2015-12-28 DIAGNOSIS — Z87891 Personal history of nicotine dependence: Secondary | ICD-10-CM

## 2015-12-28 DIAGNOSIS — J45909 Unspecified asthma, uncomplicated: Secondary | ICD-10-CM

## 2015-12-28 DIAGNOSIS — E119 Type 2 diabetes mellitus without complications: Secondary | ICD-10-CM

## 2015-12-28 DIAGNOSIS — I10 Essential (primary) hypertension: Secondary | ICD-10-CM | POA: Diagnosis not present

## 2015-12-28 NOTE — Progress Notes (Signed)
No new diagnosis since last visit.

## 2015-12-28 NOTE — Progress Notes (Addendum)
[facility] day:  12/28/2015  Chief Complaint: Stephanie Short is a 65 y.o. female with an IgG monoclonal gammopathy who is seen for review of work-up and discussion regarding direction of therapy.  HPI:  The patient was last seen in the medical oncology clinic on 12/21/2015.  At that time, she was seen for initial consultation.  Screening labs had noted a 0.4 gm/dL IgG monoclonal protein.  She denied any B symptoms, recurrent infections or bone pain.  Creatinine and calcium were normal.  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  She underwent a work-up.   CBC revealed a hematocrit of 40.4, hemoglobin 13.5, MCV 78.4, platelets 243,000, white count 7100 with an ANC of 5100. Comprehensive metabolic panel included a creatinine of 0.79, calcium 10.1, protein 8.8 and albumin of 4.3.  SPEP revealed a 0.6 gm/dL IgG monoclonal protein with kappa light chain specificity.  Immunoglobulin levels were normal with an IgG of 1500, IgA 311 and IgM 181.  Kappa free light chains were 24.7, lambda free light chain 17.5 with a ratio of 1.41 (normal).  Beta-2 microglobulin was 1.9 (normal).  Symptomatically, she denies any new complaints.  She turned in her 24 hour urine today.  She missed her bone survey.   Past Medical History:  Diagnosis Date  . Asthma   . Depression   . Diabetes mellitus   . Hepatitis C    Dr. 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 Hypertension   . Insomnia     Past Surgical History:  Procedure Laterality Date  . BACK SURGERY  2013   [facility] Dr. Mauri Pole  . COLONOSCOPY W/ POLYPECTOMY    . COLONOSCOPY WITH PROPOFOL N/A 03/13/2015   Procedure: COLONOSCOPY WITH PROPOFOL;  Surgeon: Lollie Sails, MD;  Location: [facility] ENDOSCOPY;  Service: Endoscopy;  Laterality: N/A;  . HYSTEROSCOPY W/D&C N/A 08/18/2014   Procedure: DILATATION AND CURETTAGE /HYSTEROSCOPY;  Surgeon: Lorette Ang, MD;   Location: [facility] ORS;  Service: Gynecology;  Laterality: N/A;  . KNEE ARTHROSCOPY Right     Family History  Problem Relation Age of Onset  . Coronary artery disease Mother 39  . Coronary artery disease Brother 33  . Heart attack Brother     MI  . Heart disease Father   . Diabetes Father   . Diabetes Other   . Hypertension Other   . Colon cancer Other   She has a nephew who died of colon cancer at age 59.   Social History:  reports that she quit smoking about 11 years ago. She has a 20.00 pack-year smoking history. She has never used smokeless tobacco. She reports that she drinks alcohol. She reports that she does not use drugs.  The patient is alone today.  Allergies:  Allergies  Allergen Reactions  . Codeine Nausea And Vomiting  . Dilaudid [Hydromorphone Hcl] Nausea And Vomiting  . Percocet [Oxycodone-Acetaminophen] Nausea And Vomiting and Nausea Only  . Tramadol Nausea And Vomiting and Nausea Only  . Gabapentin Nausea And Vomiting and Other (See Comments)    Reaction:  Dizziness and GI upset  . Hydrocodone-Acetaminophen Nausea And Vomiting and Nausea Only  . Hydromorphone Nausea Only  . Oxycodone-Acetaminophen Nausea Only  . Promethazine Other (See Comments)    Other reaction(s): Other (See Comments) Reaction:  GI upset  Reaction:  GI upset   . Scallops [Shellfish Allergy] Nausea And Vomiting  Other reaction(s): Nausea And Vomiting    Current Medications: Current Outpatient Prescriptions  Medication Sig Dispense Refill  . acetaminophen (TYLENOL) 500 MG tablet Take 1,000-1,500 mg by mouth every 6 (six) hours as needed for mild pain.    Marland Kitchen albuterol (PROVENTIL HFA;VENTOLIN HFA) 108 (90 Base) MCG/ACT inhaler Inhale 2 puffs into the lungs every 6 (six) hours as needed for wheezing or shortness of breath.    . ALPRAZolam (XANAX) 0.25 MG tablet Take 0.125-0.25 mg by mouth 2 (two) times daily as needed for anxiety or sleep.    . Ascorbic Acid (VITAMIN C PO) Take 1 tablet by  mouth at bedtime.    Marland Kitchen aspirin EC 81 MG tablet Take 81 mg by mouth at bedtime.    . Cyanocobalamin (VITAMELTS ENERGY VITAMIN B-12) 1500 MCG TBDP Take 1 tablet by mouth daily. 30 tablet 2  . felodipine (PLENDIL) 10 MG 24 hr tablet Take 10 mg by mouth at bedtime.    . metFORMIN (GLUCOPHAGE-XR) 500 MG 24 hr tablet Take 1,000 mg by mouth every evening.    . ondansetron (ZOFRAN-ODT) 4 MG disintegrating tablet Take 1 tablet (4 mg total) by mouth every 8 (eight) hours as needed for nausea or vomiting. 30 tablet 2  . ranitidine (ZANTAC) 150 MG capsule Take 150 mg by mouth 2 (two) times daily.  5  . spironolactone (ALDACTONE) 25 MG tablet Take 25 mg by mouth daily.     Marland Kitchen zolpidem (AMBIEN) 5 MG tablet TAKE 1/2-1 TABLET BY MOUTH AT BEDTIME AS NEEDED FOR SLEEP  0  . oxyCODONE (OXY IR/ROXICODONE) 5 MG immediate release tablet Take 0.5 tablets (2.5 mg total) by mouth every 4 (four) hours as needed for severe pain. 90 tablet 0  . pantoprazole (PROTONIX) 40 MG tablet Take 40 mg by mouth daily.  3   Current Facility-Administered Medications  Medication Dose Route Frequency Provider Last Rate Last Dose  . fentaNYL (SUBLIMAZE) injection 25-50 mcg  25-50 mcg Intravenous Q5 min PRN Milinda Pointer, MD      . lactated ringers infusion 1,000 mL  1,000 mL Intravenous Once Milinda Pointer, MD      . lidocaine (PF) (XYLOCAINE) 1 % injection 10 mL  10 mL Other Once Milinda Pointer, MD      . midazolam (VERSED) 5 MG/5ML injection 1-2 mg  1-2 mg Intravenous Q5 min PRN Milinda Pointer, MD      . ropivacaine (PF) 2 mg/ml (0.2%) (NAROPIN) epidural 9 mL  9 mL Other Once Milinda Pointer, MD      . triamcinolone acetonide (KENALOG-40) injection 40 mg  40 mg Other Once Milinda Pointer, MD        Review of Systems:  GENERAL:  Feels good.  No fevers or sweats.  Weight up 3 pounds. PERFORMANCE STATUS (ECOG):  0 HEENT:  No visual changes, runny nose, sore throat, mouth sores or tenderness. Lungs:  Shortness of breath  with exertion.  No cough.  No hemoptysis. Cardiac:  No chest pain, palpitations, orthopnea, or PND. GI:  No nausea, vomiting, diarrhea, constipation, melena or hematochezia. GU:  No urgency, frequency, dysuria, or hematuria. Musculoskeletal: h/o back surgery.  Left knee arthritis.  No muscle tenderness. Extremities:  No pain or swelling. Skin:  No rashes or skin changes. Neuro:  Recent on/off headaches.  No numbness or weakness, balance or coordination issues. Endocrine:  No diabetes, thyroid issues, hot flashes or night sweats. Psych:  No mood changes.  h/o depression .  No anxiety. Pain:  No  focal pain. Review of systems:  All other systems reviewed and found to be negative.  Physical Exam: Blood pressure 130/86, pulse (!) 103, temperature 97.5 F (36.4 C), temperature source Tympanic, resp. rate 18, weight 184 lb 8.4 oz (83.7 kg). GENERAL:  Well developed, well nourished, sitting comfortably in the exam room in no acute distress. MENTAL STATUS:  Alert and oriented to person, place and time. HEAD:  Brown hair.  Normocephalic, atraumatic, face symmetric, no Cushingoid features. EYES:  Glasses.  Brown eyes.  No conjunctivitis or scleral icterus. NEUROLOGICAL: Unremarkable. PSYCH:  Appropriate.  No visits with results within 3 Day(s) from this visit.  Latest known visit with results is:  Appointment on 12/21/2015  Component Date Value Ref Range Status  . WBC 12/21/2015 7.1  3.6 - 11.0 K/uL Final  . RBC 12/21/2015 5.15  3.80 - 5.20 MIL/uL Final  . Hemoglobin 12/21/2015 13.5  12.0 - 16.0 g/dL Final  . HCT 12/21/2015 40.4  35.0 - 47.0 % Final  . MCV 12/21/2015 78.4* 80.0 - 100.0 fL Final  . MCH 12/21/2015 26.2  26.0 - 34.0 pg Final  . MCHC 12/21/2015 33.4  32.0 - 36.0 g/dL Final  . RDW 12/21/2015 16.5* 11.5 - 14.5 % Final  . Platelets 12/21/2015 243  150 - 440 K/uL Final  . Neutrophils Relative % 12/21/2015 73  % Final  . Neutro Abs 12/21/2015 5.1  1.4 - 6.5 K/uL Final  .  Lymphocytes Relative 12/21/2015 19  % Final  . Lymphs Abs 12/21/2015 1.3  1.0 - 3.6 K/uL Final  . Monocytes Relative 12/21/2015 8  % Final  . Monocytes Absolute 12/21/2015 0.5  0.2 - 0.9 K/uL Final  . Eosinophils Relative 12/21/2015 1  % Final  . Eosinophils Absolute 12/21/2015 0.0  0 - 0.7 K/uL Final  . Basophils Relative 12/21/2015 1  % Final  . Basophils Absolute 12/21/2015 0.0  0 - 0.1 K/uL Final  . Sodium 12/21/2015 138  135 - 145 mmol/L Final  . Potassium 12/21/2015 3.7  3.5 - 5.1 mmol/L Final  . Chloride 12/21/2015 105  101 - 111 mmol/L Final  . CO2 12/21/2015 24  22 - 32 mmol/L Final  . Glucose, Bld 12/21/2015 173* 65 - 99 mg/dL Final  . BUN 12/21/2015 23* 6 - 20 mg/dL Final  . Creatinine, Ser 12/21/2015 0.79  0.44 - 1.00 mg/dL Final  . Calcium 12/21/2015 10.1  8.9 - 10.3 mg/dL Final  . Total Protein 12/21/2015 8.8* 6.5 - 8.1 g/dL Final  . Albumin 12/21/2015 4.3  3.5 - 5.0 g/dL Final  . AST 12/21/2015 25  15 - 41 U/L Final  . ALT 12/21/2015 15  14 - 54 U/L Final  . Alkaline Phosphatase 12/21/2015 84  38 - 126 U/L Final  . Total Bilirubin 12/21/2015 0.3  0.3 - 1.2 mg/dL Final  . GFR calc non Af Amer 12/21/2015 >60  >60 mL/min Final  . GFR calc Af Amer 12/21/2015 >60  >60 mL/min Final   Comment: (NOTE) The eGFR has been calculated using the CKD EPI equation. This calculation has not been validated in all clinical situations. eGFR's persistently <60 mL/min signify possible Chronic Kidney Disease.   . Anion gap 12/21/2015 9  5 - 15 Final  . Beta-2 Microglobulin 12/22/2015 1.9  0.6 - 2.4 mg/L Final   Comment: (NOTE) Siemens Immulite 2000 Immunochemiluminometric assay [facility]) Performed At: [facility] [place], [place] [id number] Lindon Romp MD [id number]   . IgG (Immunoglobin  G), Serum 12/25/2015 1500  700 - 1,600 mg/dL Final  . IgA 12/25/2015 311  87 - 352 mg/dL Final  . IgM, Serum 12/25/2015 181  26 - 217 mg/dL Final  . Total Protein ELP  12/25/2015 8.1  6.0 - 8.5 g/dL Corrected  . Albumin SerPl Elph-Mcnc 12/25/2015 4.1  2.9 - 4.4 g/dL Corrected  . Alpha 1 12/25/2015 0.2  0.0 - 0.4 g/dL Corrected  . Alpha2 Glob SerPl Elph-Mcnc 12/25/2015 0.9  0.4 - 1.0 g/dL Corrected  . B-Globulin SerPl Elph-Mcnc 12/25/2015 1.3  0.7 - 1.3 g/dL Corrected  . Gamma Glob SerPl Elph-Mcnc 12/25/2015 1.6  0.4 - 1.8 g/dL Corrected  . M Protein SerPl Elph-Mcnc 12/25/2015 0.6* Not Observed g/dL Corrected  . Globulin, Total 12/25/2015 4.0* 2.2 - 3.9 g/dL Corrected  . Albumin/Glob SerPl 12/25/2015 1.1  0.7 - 1.7 Corrected  . IFE 1 12/25/2015 Comment   Corrected   Comment: (NOTE) Immunofixation shows IgG monoclonal protein with kappa light chain specificity.   . Please Note 12/25/2015 Comment   Corrected   Comment: (NOTE) Protein electrophoresis scan will follow via computer, mail, or courier delivery.   . Kappa free light chain 12/24/2015 24.7* 3.3 - 19.4 mg/L Final  . Lamda free light chains 12/24/2015 17.5  5.7 - 26.3 mg/L Final  . Kappa, lamda light chain ratio 12/24/2015 1.41  0.26 - 1.65 Final   Comment: (NOTE) Performed At: [facility] [place], [place] [id number] Lindon Romp MD [id number]     Assessment:  AJEENAH HEINY is a 65 y.o. female with a IgG monoclonal gammopathy noted on screening labs.  She likely has a monoclonal gammopathy of unknown significance (MGUS).  SPEP on 11/29/2015 revealed a 0.4 g/dL IgG monoclonal protein with kappa light chain specificity.  Creatinine was 0.9, albumen 4.8, and calcium 10.4 (8.7-10.3).  Work-up on 12/21/2015 revealed the following normal labs:  CBC with diff, CMP, immunoglobulin levels, and beta-2 microglobulin (1.9).  SPEP revealed a 0.6 g/dL IgG monoclonal protein with kappa light chain specificity.  Kappa free light chains were 24.7, lambda free light chain 17.5 with a ratio of 1.41 (normal).    Symptomatically, she denies any bone pain or recurrent infections.   Exam is normal.  Plan: 1.  Review labs performed at last visit.  Suspect monoclonal gammopathy of unknown significance (MGUS).  Await 24 hour urine and bone survey.  Nurse to call patient with results.  Discuss plan for follow-up every 6 months secondary to risk of transformation to a lymphopriliferaive disorder or myeloma is 1%/year.  Discuss signs and symptoms of disease. 2.  Patient to have bone survey today. 3.  Follow-up 24 hour urine for UPEP and free light chains. 4.  Nurse to call patient with 24 hour UPEP and bone survey results 5.  RTC in 6 months for MD assess, labs (CBC with diff, CMP, SPEP)  Addendum:  Bone survey today revealed no radiographically worrisome osteolytic lesions identified in the axial or appendicular skeleton.   Lequita Asal, MD  12/28/2015, 3:57 PM

## 2015-12-31 LAB — IFE+PROTEIN ELECTRO, 24-HR UR
% BETA, Urine: 0 %
ALPHA 1 URINE: 0 %
Albumin, U: 100 %
Alpha 2, Urine: 0 %
GAMMA GLOBULIN URINE: 0 %
PDF: 0
Total Protein, Urine-Ur/day: 142 mg/24 hr (ref 30–150)
Total Protein, Urine: 12.9 mg/dL
Total Volume: 1100

## 2016-01-01 ENCOUNTER — Telehealth: Payer: Self-pay | Admitting: *Deleted

## 2016-01-01 NOTE — Telephone Encounter (Signed)
Called to let pt know of results of upep and bone survey, message left for pt to call back.

## 2016-01-02 ENCOUNTER — Ambulatory Visit: Payer: 59 | Admitting: Pain Medicine

## 2016-01-02 ENCOUNTER — Telehealth: Payer: Self-pay | Admitting: *Deleted

## 2016-01-02 NOTE — Telephone Encounter (Signed)
Pt calling back from a call left yesterday to get results of bone survey. 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  She wanted to know where is showed it because she has pain in her neck.  The areas seen was C3-C7.  She wanted to know if someone could do something about that and I told her that with osteoarthritis people usually see rheumatology. She sees Dr. Ouida Sills in [facility] and I told her there are 2 rheuatologists there and she should ask her PCP about going there. She will .

## 2016-01-29 ENCOUNTER — Ambulatory Visit: Payer: 59 | Attending: Pain Medicine | Admitting: Pain Medicine

## 2016-01-29 ENCOUNTER — Encounter: Payer: Self-pay | Admitting: Pain Medicine

## 2016-01-29 VITALS — BP 132/72 | HR 105 | Temp 97.7°F | Resp 16 | Ht 64.5 in | Wt 183.0 lb

## 2016-01-29 DIAGNOSIS — E782 Mixed hyperlipidemia: Secondary | ICD-10-CM | POA: Diagnosis not present

## 2016-01-29 DIAGNOSIS — I700 Atherosclerosis of aorta: Secondary | ICD-10-CM | POA: Diagnosis not present

## 2016-01-29 DIAGNOSIS — M79604 Pain in right leg: Secondary | ICD-10-CM | POA: Diagnosis not present

## 2016-01-29 DIAGNOSIS — Z833 Family history of diabetes mellitus: Secondary | ICD-10-CM | POA: Diagnosis not present

## 2016-01-29 DIAGNOSIS — E1122 Type 2 diabetes mellitus with diabetic chronic kidney disease: Secondary | ICD-10-CM | POA: Insufficient documentation

## 2016-01-29 DIAGNOSIS — Z79899 Other long term (current) drug therapy: Secondary | ICD-10-CM | POA: Diagnosis not present

## 2016-01-29 DIAGNOSIS — Z79891 Long term (current) use of opiate analgesic: Secondary | ICD-10-CM | POA: Diagnosis not present

## 2016-01-29 DIAGNOSIS — M545 Low back pain, unspecified: Secondary | ICD-10-CM

## 2016-01-29 DIAGNOSIS — M810 Age-related osteoporosis without current pathological fracture: Secondary | ICD-10-CM | POA: Insufficient documentation

## 2016-01-29 DIAGNOSIS — N189 Chronic kidney disease, unspecified: Secondary | ICD-10-CM | POA: Insufficient documentation

## 2016-01-29 DIAGNOSIS — M160 Bilateral primary osteoarthritis of hip: Secondary | ICD-10-CM | POA: Insufficient documentation

## 2016-01-29 DIAGNOSIS — Z5181 Encounter for therapeutic drug level monitoring: Secondary | ICD-10-CM | POA: Insufficient documentation

## 2016-01-29 DIAGNOSIS — J45909 Unspecified asthma, uncomplicated: Secondary | ICD-10-CM | POA: Diagnosis not present

## 2016-01-29 DIAGNOSIS — G894 Chronic pain syndrome: Secondary | ICD-10-CM | POA: Diagnosis not present

## 2016-01-29 DIAGNOSIS — Z800 Family history of malignant neoplasm of digestive organs: Secondary | ICD-10-CM | POA: Insufficient documentation

## 2016-01-29 DIAGNOSIS — Z87891 Personal history of nicotine dependence: Secondary | ICD-10-CM | POA: Insufficient documentation

## 2016-01-29 DIAGNOSIS — M79605 Pain in left leg: Secondary | ICD-10-CM | POA: Diagnosis not present

## 2016-01-29 DIAGNOSIS — Z8249 Family history of ischemic heart disease and other diseases of the circulatory system: Secondary | ICD-10-CM | POA: Diagnosis not present

## 2016-01-29 DIAGNOSIS — F329 Major depressive disorder, single episode, unspecified: Secondary | ICD-10-CM | POA: Diagnosis not present

## 2016-01-29 DIAGNOSIS — M533 Sacrococcygeal disorders, not elsewhere classified: Secondary | ICD-10-CM | POA: Insufficient documentation

## 2016-01-29 DIAGNOSIS — F1190 Opioid use, unspecified, uncomplicated: Secondary | ICD-10-CM

## 2016-01-29 DIAGNOSIS — M961 Postlaminectomy syndrome, not elsewhere classified: Secondary | ICD-10-CM

## 2016-01-29 DIAGNOSIS — I129 Hypertensive chronic kidney disease with stage 1 through stage 4 chronic kidney disease, or unspecified chronic kidney disease: Secondary | ICD-10-CM | POA: Diagnosis not present

## 2016-01-29 DIAGNOSIS — M1288 Other specific arthropathies, not elsewhere classified, other specified site: Secondary | ICD-10-CM

## 2016-01-29 DIAGNOSIS — Z7984 Long term (current) use of oral hypoglycemic drugs: Secondary | ICD-10-CM | POA: Insufficient documentation

## 2016-01-29 DIAGNOSIS — Z7982 Long term (current) use of aspirin: Secondary | ICD-10-CM | POA: Diagnosis not present

## 2016-01-29 DIAGNOSIS — M488X6 Other specified spondylopathies, lumbar region: Secondary | ICD-10-CM | POA: Insufficient documentation

## 2016-01-29 DIAGNOSIS — G8929 Other chronic pain: Secondary | ICD-10-CM

## 2016-01-29 DIAGNOSIS — D472 Monoclonal gammopathy: Secondary | ICD-10-CM | POA: Insufficient documentation

## 2016-01-29 DIAGNOSIS — M47896 Other spondylosis, lumbar region: Secondary | ICD-10-CM | POA: Diagnosis not present

## 2016-01-29 DIAGNOSIS — M47816 Spondylosis without myelopathy or radiculopathy, lumbar region: Secondary | ICD-10-CM

## 2016-01-29 DIAGNOSIS — E538 Deficiency of other specified B group vitamins: Secondary | ICD-10-CM | POA: Insufficient documentation

## 2016-01-29 MED ORDER — OXYCODONE HCL 5 MG PO TABS: 2.5000 mg | ORAL_TABLET | ORAL | 0 refills | Status: DC | PRN

## 2016-01-29 NOTE — Progress Notes (Signed)
Nursing Pain Medication Assessment:  Safety precautions to be maintained throughout the outpatient stay will include: orient to surroundings, keep bed in low position, maintain call bell within reach at all times, provide assistance with transfer out of bed and ambulation.  Medication Inspection Compliance: Pill count conducted under aseptic conditions, in front of the patient. Neither the pills nor the bottle was removed from the patient's sight at any time. Once count was completed pills were immediately returned to the patient in their original bottle.  Medication: See above Pill Count: 5 of 90 pills remain Bottle Appearance: Standard pharmacy container. Clearly labeled. Filled Date: 51 / 07 / 2017 Medication last intake:01/27/16

## 2016-01-29 NOTE — Progress Notes (Deleted)
Safety precautions to be maintained throughout the outpatient stay will include: orient to surroundings, keep bed in low position, maintain call bell within reach at all times, provide assistance with transfer out of bed and ambulation.  

## 2016-01-29 NOTE — Progress Notes (Signed)
Patient's Name: Stephanie Short  MRN: [id number]  Referring Provider: Kirk Ruths, MD  DOB: [birth year]  PCP: Kirk Ruths, MD  DOS: 01/29/2016  Note by: Kathlen Brunswick. Dossie Arbour, MD  Service setting: Ambulatory outpatient  Specialty: Interventional Pain Management  Location: [facility] (AMB) Pain Management Facility    Patient type: Established   Primary Reason(s) for Visit: Encounter for prescription drug management & post-procedure evaluation of chronic illness with mild to moderate exacerbation(Level of risk: moderate) CC: Back Pain (lower) and Hip Pain (bilateral)  HPI  Stephanie Short is a 66 y.o. year old, female patient, who comes today for a post-procedure evaluation and medication management. She has HYPERLIPIDEMIA-MIXED; AORTIC ATHEROSCLEROSIS; Endometrial polyp; Chronic low back pain (Location of Primary Source of Pain) (Bilateral) (R>L); Lumbar spondylosis; Failed back surgical syndrome; Lumbar facet syndrome (Bilateral) (R>L); Long term current use of opiate analgesic; Long term prescription opiate use; Opiate use (22.5 MME/Day); Encounter for therapeutic drug level monitoring; Encounter for chronic pain management; Airway hyperreactivity; Atherosclerosis of abdominal aorta ([place]); Cervical nerve root disorder; Diabetes mellitus ([place]); Benign essential HTN; Cannot sleep; Major depression in remission ([place]); Burning or prickling sensation; Hemorrhage, postmenopausal; Pure hypercholesterolemia; Osteopenia; Chronic hip pain(2) (Bilateral) (L>R); Greater trochanteric bursitis of hips (Bilateral); Chronic sacroiliac joint pain (Bilateral); Osteoarthritis of hips (Bilateral); Pain management; Chronic pain of lower extremity  (Bilateral) (L>R); Postmenopausal osteoporosis; Chronic cervical radicular pain; Neurogenic pain; Chronic kidney disease; Vitamin B12 deficiency; Problem with medical care compliance; Poor historian; Asthma; Chronic neck pain (3) (B) (R>L); Monoclonal gammopathy; and Chronic  pain syndrome on her problem list. Her primarily concern today is the Back Pain (lower) and Hip Pain (bilateral)  Pain Assessment: Self-Reported Pain Score: 2 /10             Reported level is compatible with observation.       Pain Type: Chronic pain Pain Location: Back (hips) Pain Orientation: Lower, Left, Right (bilateral) Pain Descriptors / Indicators: Dull, Aching, Spasm (spasms occur more on the right) Pain Frequency: Intermittent  Ms. Langton was last seen on 11/26/2015 for a procedure. During today's appointment we reviewed Stephanie Short's post-procedure results, as well as her outpatient medication regimen.  Further details on both, my assessment(s), as well as the proposed treatment plan, please see below.  Controlled Substance Pharmacotherapy Assessment REMS (Risk Evaluation and Mitigation Strategy)  Analgesic:Oxycodone IR 2.5 mg, 1 tablet every 4 hours PRN Maximum: 3/day. (7.5 mg/day of oxycodone) MME/day:11.25  Mg/day Janett Billow, RN  01/29/2016 11:57 AM  Sign at close encounter Nursing Pain Medication Assessment:  Safety precautions to be maintained throughout the outpatient stay will include: orient to surroundings, keep bed in low position, maintain call bell within reach at all times, provide assistance with transfer out of bed and ambulation.  Medication Inspection Compliance: Pill count conducted under aseptic conditions, in front of the patient. Neither the pills nor the bottle was removed from the patient's sight at any time. Once count was completed pills were immediately returned to the patient in their original bottle.  Medication: See above Pill Count: 5 of 90 pills remain Bottle Appearance: Standard pharmacy container. Clearly labeled. Filled Date: 77 / 07 / 2017 Medication last intake:01/27/16   Pharmacokinetics: Liberation and absorption (onset of action): WNL Distribution (time to peak effect): WNL Metabolism and excretion (duration of action): WNL          Pharmacodynamics: Desired effects: Analgesia: Stephanie Short reports >50% benefit. Functional ability: Patient reports that medication allows her to accomplish basic  ADLs Clinically meaningful improvement in function (CMIF): Sustained CMIF goals met Perceived effectiveness: Described as relatively effective, allowing for increase in activities of daily living (ADL) Undesirable effects: Side-effects or Adverse reactions: None reported Monitoring: [place] PMP: Online review of the past 15-monthperiod conducted. Compliant with practice rules and regulations List of all UDS test(s) done:  Lab Results  Component Value Date   TOXASSSELUR FINAL 10/17/2015   TOXASSSELUR FINAL 05/23/2015   TOXASSSELUR FINAL 04/24/2015   [place]way[place]AL 01/31/2015   Last UDS on record: [employer]ox[employer] Select 13  Date Value Ref Range Status  10/17/2015 FINAL  Final    Comment:    ==================================================================== TOXASSURE SELECT 13 (MW) ==================================================================== Test                             Result       Flag       Units Drug Absent but Declared for Prescription Verification   Alprazolam                     Not Detected UNEXPECTED ng/mg creat   Oxycodone                      Not Detected UNEXPECTED ng/mg creat ==================================================================== Test                      Result    Flag   Units      Ref Range   Creatinine              133              mg/dL      >=20 ==================================================================== Declared Medications:  The flagging and interpretation on this report are based on the  following declared medications.  Unexpected results may arise from  inaccuracies in the declared medications.  **Note: The testing scope of this panel includes these medications:  Alprazolam (Xanax)  Oxycodone  **Note: The testing scope of this panel does not include following   reported medications:  Acetaminophen (Tylenol)  Albuterol (Proventil)  Aspirin (Aspirin 81)  Cyanocobalamin  Felodipine  Metformin  Ondansetron  Pantoprazole  Ranitidine  Spironolactone  Vitamin C  Zolpidem ==================================================================== For clinical consultation, please call [phone] ====================================================================    UDS interpretation: Compliant          Medication Assessment Form: Reviewed. Patient indicates being compliant with therapy Treatment compliance: Compliant Risk Assessment Profile: Aberrant behavior: See prior evaluations. None observed or detected today Comorbid factors increasing risk of overdose: See prior notes. No additional risks detected today Risk of substance use disorder (SUD): Low Opioid Risk Tool (ORT) Total Score: 0  Interpretation Table:  Score <3 = Low Risk for SUD  Score between 4-7 = Moderate Risk for SUD  Score >8 = High Risk for Opioid Abuse   Risk Mitigation Strategies:  Patient Counseling: Covered Patient-Prescriber Agreement (PPA): Present and active  Notification to other healthcare providers: Done  Pharmacologic Plan: No change in therapy, at this time  Post-Procedure Assessment  11/26/2015 Procedure: Diagnostic bilateral lumbar facet block #3 Post-procedure pain score: 0/10 (100% relief) Influential Factors: BMI: 30.93 kg/m Intra-procedural challenges: None observed Assessment challenges: None detected         Post-procedural side-effects, adverse reactions, or complications: Severe nausea & vomiting. Lasted 24 hours. Took Zofran 861m one tablet did not help.  Reported issues: None  Sedation: Sedation provided. When  no sedatives are used, the analgesic levels obtained are directly associated to the effectiveness of the local anesthetics. However, when sedation is provided, the level of analgesia obtained during the initial 1 hour following the  intervention, is believed to be the result of a combination of factors. These factors may include, but are not limited to: 1. The effectiveness of the local anesthetics used. 2. The effects of the analgesic(s) and/or anxiolytic(s) used. 3. The degree of discomfort experienced by the patient at the time of the procedure. 4. The patients ability and reliability in recalling and recording the events. 5. The presence and influence of possible secondary gains and/or psychosocial factors. Reported result: Relief experienced during the 1st hour after the procedure: 100 % (Ultra-Short Term Relief) Interpretative annotation: Analgesia during this period is likely to be Local Anesthetic and/or IV Sedative (Analgesic/Anxiolitic) related.          Effects of local anesthetic: The analgesic effects attained during this period are directly associated to the localized infiltration of local anesthetics and therefore cary significant diagnostic value as to the etiological location, or anatomical origin, of the pain. Expected duration of relief is directly dependent on the pharmacodynamics of the local anesthetic used. Long-acting (4-6 hours) anesthetics used.  Reported result: Relief during the next 4 to 6 hour after the procedure: 100 % (Short-Term Relief) Interpretative annotation: Complete relief would suggest area to be the source of the pain.          Long-term benefit: Defined as the period of time past the expected duration of local anesthetics. With the possible exception of prolonged sympathetic blockade from the local anesthetics, benefits during this period are typically attributed to, or associated with, other factors such as analgesic sensory neuropraxia, antiinflammatory effects, or beneficial biochemical changes provided by agents other than the local anesthetics Reported result: Extended relief following procedure: 75 % (Long-Term Relief) Interpretative annotation: Good relief. This could suggest  inflammation to be a significant component in the etiology to the pain.          Current benefits: Defined as persistent relief that continues at this point in time.   Reported results: Treated area: <75 % In addition, the patient reports improvement in function Interpretative annotation: Ongoing benefits would suggest effective therapeutic approach  Interpretation: Results would suggest a successful diagnostic intervention. The patient has failed to respond to conservative therapies including over-the-counter medications, anti-inflammatories, muscle relaxants, membrane stabilizers, opioids, physical therapy, modalities such as heat and ice, as well as more invasive techniques such as nerve blocks. Because Stephanie Short did attain more than 50% relief of the pain during a series of diagnostic blocks conducted in separate occasions, I believe it is medically necessary to proceed with Radiofrequency Ablation, in order to attempt gaining longer relief.  Laboratory Chemistry  Inflammation Markers Lab Results  Component Value Date   ESRSEDRATE 7 04/25/2015   CRP 0.6 04/25/2015   Renal Function Lab Results  Component Value Date   BUN 23 (H) 12/21/2015   CREATININE 0.79 12/21/2015   GFRAA >60 12/21/2015   GFRNONAA >60 12/21/2015   Hepatic Function Lab Results  Component Value Date   AST 25 12/21/2015   ALT 15 12/21/2015   ALBUMIN 4.3 12/21/2015   Electrolytes Lab Results  Component Value Date   NA 138 12/21/2015   K 3.7 12/21/2015   CL 105 12/21/2015   CALCIUM 10.1 12/21/2015   MG 1.9 04/25/2015   Pain Modulating Vitamins Lab Results  Component Value Date  [id number] 55 04/25/2015   [id number] 55 04/25/2015   VD2125OH2 <10 04/25/2015   VITAMINB12 178 (L) 04/25/2015   Coagulation Parameters Lab Results  Component Value Date   INR 1.0 09/16/2012   LABPROT 13.3 09/16/2012   PLT 243 12/21/2015   Cardiovascular Lab Results  Component Value Date   HGB 13.5 12/21/2015   HCT  40.4 12/21/2015   Note: Lab results reviewed.  Recent Diagnostic Imaging Review  Dg Bone Survey Met  Result Date: 12/28/2015 CLINICAL DATA:  Monoclonal gammopathy initial encounter EXAM: METASTATIC BONE SURVEY COMPARISON:  CXR 06/06/2015, chest, abdomen and pelvic CT from 12/01/2014, CT neck 01/28/2011, MRI 12/26/2010 of the lumbar spine. FINDINGS: No radiographically worrisome osteolytic lesions identified of the axial nor appendicular skeleton. Chest radiograph: Heart is normal in size. Aortic atherosclerosis is seen. There is atelectasis at the lung bases left greater than right. Findings are similar to 06/06/2015 CXR. Cervical spine: No osteolytic lesions. Disc space narrowing from C4 through C7 most marked between C5 and C7. Multilevel degenerative facet disease with uncovertebral osteoarthritis noted bilaterally at C3-4, C4-5, C5-6 and C6-7. Lateral skull:  Negative Bilateral shoulders: Negative Bilateral humeri: Negative Bilateral forearms: Negative Thoracic spine: Mild degenerative disc disease mid to upper thoracic spine. No osteolytic abnormality identified radiographically. Lumbar spine: Degenerative facet sclerosis L1 through S1. Disc spaces are maintained. No osteolytic abnormality of the vertebral bodies. Aortoiliac atherosclerosis without aneurysm. AP pelvis: No fracture or bone destruction. Tiny ovoid lucency overlying the left iliac bone is consistent with overlying bowel possibly diverticulum. Scattered phleboliths are seen in the pelvis bilaterally. Bilateral lower extremities: Negative IMPRESSION: No radiographically worrisome osteolytic lesions identified of the axial or appendicular skeleton. Electronically Signed   By: Ashley Royalty M.D.   On: 12/28/2015 17:18   Note: Imaging results reviewed.          Meds  The patient has a current medication list which includes the following prescription(s): acetaminophen, albuterol, alprazolam, ascorbic acid, aspirin ec, felodipine, metformin,  ondansetron, oxycodone, oxycodone, spironolactone, trazodone, zolpidem, and cyanocobalamin.  Current Outpatient Prescriptions on File Prior to Visit  Medication Sig  . acetaminophen (TYLENOL) 500 MG tablet Take 1,000-1,500 mg by mouth every 6 (six) hours as needed for mild pain.  Marland Kitchen albuterol (PROVENTIL HFA;VENTOLIN HFA) 108 (90 Base) MCG/ACT inhaler Inhale 2 puffs into the lungs every 6 (six) hours as needed for wheezing or shortness of breath.  . ALPRAZolam (XANAX) 0.25 MG tablet Take 0.125-0.25 mg by mouth 2 (two) times daily as needed for anxiety or sleep.  . Ascorbic Acid (VITAMIN C PO) Take 1 tablet by mouth at bedtime.  Marland Kitchen aspirin EC 81 MG tablet Take 81 mg by mouth at bedtime.  . felodipine (PLENDIL) 10 MG 24 hr tablet Take 10 mg by mouth at bedtime.  . metFORMIN (GLUCOPHAGE-XR) 500 MG 24 hr tablet Take 1,000 mg by mouth every evening.  . ondansetron (ZOFRAN-ODT) 4 MG disintegrating tablet Take 1 tablet (4 mg total) by mouth every 8 (eight) hours as needed for nausea or vomiting.  Marland Kitchen spironolactone (ALDACTONE) 25 MG tablet Take 25 mg by mouth daily.   Marland Kitchen zolpidem (AMBIEN) 5 MG tablet TAKE 1/2-1 TABLET BY MOUTH AT BEDTIME AS NEEDED FOR SLEEP  . Cyanocobalamin (VITAMELTS ENERGY VITAMIN B-12) 1500 MCG TBDP Take 1 tablet by mouth daily.   No current facility-administered medications on file prior to visit.    ROS  Constitutional: Denies any fever or chills Gastrointestinal: No reported hemesis, hematochezia, vomiting, or acute GI distress Musculoskeletal:  Denies any acute onset joint swelling, redness, loss of ROM, or weakness Neurological: No reported episodes of acute onset apraxia, aphasia, dysarthria, agnosia, amnesia, paralysis, loss of coordination, or loss of consciousness  Allergies  Stephanie Short is allergic to codeine; dilaudid [hydromorphone hcl]; percocet [oxycodone-acetaminophen]; tramadol; gabapentin; hydrocodone-acetaminophen; hydromorphone; oxycodone-acetaminophen; promethazine;  and scallops [shellfish allergy].  PFSH  Drug: Stephanie Short  reports that she does not use drugs. Alcohol:  reports that she drinks alcohol. Tobacco:  reports that she quit smoking about 12 years ago. She has a 20.00 pack-year smoking history. She has never used smokeless tobacco. Medical:  has a past medical history of Asthma; Depression; Diabetes mellitus; Hepatitis C; Hypertension; and Insomnia. Family: family history includes Colon cancer in her other; Coronary artery disease (age of onset: 47) in her brother; Coronary artery disease (age of onset: 45) in her mother; Diabetes in her father and other; Heart attack in her brother; Heart disease in her father; Hypertension in her other.  Past Surgical History:  Procedure Laterality Date  . BACK SURGERY  2013   [facility] Dr. Mauri Pole  . COLONOSCOPY W/ POLYPECTOMY    . COLONOSCOPY WITH PROPOFOL N/A 03/13/2015   Procedure: COLONOSCOPY WITH PROPOFOL;  Surgeon: Lollie Sails, MD;  Location: [facility] ENDOSCOPY;  Service: Endoscopy;  Laterality: N/A;  . HYSTEROSCOPY W/D&C N/A 08/18/2014   Procedure: DILATATION AND CURETTAGE /HYSTEROSCOPY;  Surgeon: Lorette Ang, MD;  Location: [facility] ORS;  Service: Gynecology;  Laterality: N/A;  . KNEE ARTHROSCOPY Right    Constitutional Exam  General appearance: Well nourished, well developed, and well hydrated. In no apparent acute distress Vitals:   01/29/16 1144  BP: 132/72  Pulse: (!) 105  Resp: 16  Temp: 97.7 F (36.5 C)  TempSrc: Oral  SpO2: 98%  Weight: 183 lb (83 kg)  Height: 5' 4.5" (1.638 m)   BMI Assessment: Estimated body mass index is 30.93 kg/m as calculated from the following:   Height as of this encounter: 5' 4.5" (1.638 m).   Weight as of this encounter: 183 lb (83 kg).  BMI interpretation table: BMI level Category Range association with higher incidence of chronic pain  <18 kg/m2 Underweight   18.5-24.9 kg/m2 Ideal body weight   25-29.9 kg/m2 Overweight Increased incidence by 20%   30-34.9 kg/m2 Obese (Class I) Increased incidence by 68%  35-39.9 kg/m2 Severe obesity (Class II) Increased incidence by 136%  >40 kg/m2 Extreme obesity (Class III) Increased incidence by 254%   BMI Readings from Last 4 Encounters:  01/29/16 30.93 kg/m  12/28/15 31.50 kg/m  12/21/15 30.94 kg/m  11/26/15 31.24 kg/m   Wt Readings from Last 4 Encounters:  01/29/16 183 lb (83 kg)  12/28/15 184 lb 8.4 oz (83.7 kg)  12/21/15 181 lb 3.5 oz (82.2 kg)  11/26/15 182 lb (82.6 kg)  Psych/Mental status: Alert, oriented x 3 (person, place, & time) Eyes: PERLA Respiratory: No evidence of acute respiratory distress  Cervical Spine Exam  Inspection: No masses, redness, or swelling Alignment: Symmetrical Functional ROM: Unrestricted ROM Stability: No instability detected Muscle strength & Tone: Functionally intact Sensory: Unimpaired Palpation: Non-contributory  Upper Extremity (UE) Exam    Side: Right upper extremity  Side: Left upper extremity  Inspection: No masses, redness, swelling, or asymmetry  Inspection: No masses, redness, swelling, or asymmetry  Functional ROM: Unrestricted ROM          Functional ROM: Unrestricted ROM          Muscle strength & Tone: Functionally intact  Muscle strength & Tone: Functionally intact  Sensory: Unimpaired  Sensory: Unimpaired  Palpation: Non-contributory  Palpation: Non-contributory   Thoracic Spine Exam  Inspection: No masses, redness, or swelling Alignment: Symmetrical Functional ROM: Unrestricted ROM Stability: No instability detected Sensory: Unimpaired Muscle strength & Tone: Functionally intact Palpation: Non-contributory  Lumbar Spine Exam  Inspection: No masses, redness, or swelling Alignment: Symmetrical Functional ROM: Unrestricted ROM Stability: No instability detected Muscle strength & Tone: Functionally intact Sensory: Unimpaired Palpation: Non-contributory Provocative Tests: Lumbar Hyperextension and rotation test:  evaluation deferred today       Patrick's Maneuver: evaluation deferred today              Gait & Posture Assessment  Ambulation: Unassisted Gait: Relatively normal for age and body habitus Posture: WNL   Lower Extremity Exam    Side: Right lower extremity  Side: Left lower extremity  Inspection: No masses, redness, swelling, or asymmetry  Inspection: No masses, redness, swelling, or asymmetry  Functional ROM: Unrestricted ROM          Functional ROM: Unrestricted ROM          Muscle strength & Tone: Functionally intact  Muscle strength & Tone: Functionally intact  Sensory: Unimpaired  Sensory: Unimpaired  Palpation: Non-contributory  Palpation: Non-contributory   Assessment  Primary Diagnosis & Pertinent Problem List: The primary encounter diagnosis was Chronic pain syndrome. Diagnoses of Failed back surgical syndrome, Long term current use of opiate analgesic, Opiate use (22.5 MME/Day), Chronic low back pain (Location of Primary Source of Pain) (Bilateral) (R>L), Chronic pain of both lower extremities, Chronic sacroiliac joint pain (Bilateral), and Lumbar facet syndrome (Bilateral) (R>L) were also pertinent to this visit.  Status Diagnosis  Stable Stable Stable 1. Chronic pain syndrome   2. Failed back surgical syndrome   3. Long term current use of opiate analgesic   4. Opiate use (22.5 MME/Day)   5. Chronic low back pain (Location of Primary Source of Pain) (Bilateral) (R>L)   6. Chronic pain of both lower extremities   7. Chronic sacroiliac joint pain (Bilateral)   8. Lumbar facet syndrome (Bilateral) (R>L)      Plan of Care  Pharmacotherapy (Medications Ordered): Meds ordered this encounter  Medications  . oxyCODONE (OXY IR/ROXICODONE) 5 MG immediate release tablet    Sig: Take 0.5 tablets (2.5 mg total) by mouth every 4 (four) hours as needed for severe pain.    Dispense:  90 tablet    Refill:  0    Do not place this medication, or any other prescription from our  practice, on "Automatic Refill". Patient may have prescription filled one day early if pharmacy is closed on scheduled refill date. Do not fill until: 01/29/16 To last until: 02/28/16  . oxyCODONE (OXY IR/ROXICODONE) 5 MG immediate release tablet    Sig: Take 0.5 tablets (2.5 mg total) by mouth every 4 (four) hours as needed for severe pain.    Dispense:  90 tablet    Refill:  0    Do not place this medication, or any other prescription from our practice, on "Automatic Refill". Patient may have prescription filled one day early if pharmacy is closed on scheduled refill date. Do not fill until: 02/28/16 To last until: 03/29/16   New Prescriptions   No medications on file   Medications administered today: Stephanie Short had no medications administered during this visit. Lab-work, procedure(s), and/or referral(s): Orders Placed This Encounter  Procedures  . Radiofrequency,Lumbar   Imaging and/or referral(s): None  Interventional therapies: Planned, scheduled, and/or pending:   Bilateral lumbar facet radiofrequency ablation, starting with the right side.    Considering:   Repeat diagnostic lumbar facet block under fluoroscopic guidance and IV sedation. Possible bilateral lumbar facet radiofrequency ablation Possible diagnostic bilateral sacroiliac joint block under fluoroscopic guidance, with or without sedation.  Possible diagnostic bilateral intra-articular hip joint injection under fluoroscopic guidance, with or without sedation.    Palliative PRN treatment(s):   For the low back pain, bilateral diagnostic lumbar facet block + diagnostic bilateral sacroiliac joint injection under fluoroscopic guidance with IV sedation.  For the hip pain, diagnostic intra-articular hip joint injection under fluoroscopic guidance, with or without sedation.    Provider-requested follow-up: Return in about 2 months (around 03/28/2016) for (MD) Med-Mgmt, in addition, procedure (ASAA).  Future  Appointments Date Time Provider [place]  06/27/2016 2:00 PM [facility] None  06/27/2016 2:30 PM Lequita Asal, MD [facility] None   Primary Care Physician: Kirk Ruths, MD Location: [facility] Outpatient Pain Management Facility Note by: Kathlen Brunswick. Dossie Arbour, M.D, DABA, DABAPM, DABPM, DABIPP, FIPP Date: 01/31/16; Time: 8:16 AM  Pain Score Disclaimer: We use the NRS-11 scale. This is a self-reported, subjective measurement of pain severity with only modest accuracy. It is used primarily to identify changes within a particular patient. It must be understood that outpatient pain scales are significantly less accurate that those used for research, where they can be applied under ideal controlled circumstances with minimal exposure to variables. In reality, the score is likely to be a combination of pain intensity and pain affect, where pain affect describes the degree of emotional arousal or changes in action readiness caused by the sensory experience of pain. Factors such as social and work situation, setting, emotional state, anxiety levels, expectation, and prior pain experience may influence pain perception and show large inter-individual differences that may also be affected by time variables.  Patient instructions provided during this appointment: There are no Patient Instructions on file for this visit.

## 2016-03-08 ENCOUNTER — Other Ambulatory Visit: Payer: Self-pay | Admitting: Pain Medicine

## 2016-03-08 DIAGNOSIS — E538 Deficiency of other specified B group vitamins: Secondary | ICD-10-CM

## 2016-03-09 ENCOUNTER — Encounter: Payer: Self-pay | Admitting: Hematology and Oncology

## 2016-03-11 ENCOUNTER — Ambulatory Visit: Payer: 59 | Admitting: Pain Medicine

## 2016-03-18 ENCOUNTER — Telehealth: Payer: Self-pay | Admitting: Pain Medicine

## 2016-03-18 NOTE — Telephone Encounter (Signed)
Patient is calling to see if she needs to continue B-12 meds as she will be out tomorrow 03-19-16. Please call her and let her know.

## 2016-03-19 ENCOUNTER — Encounter: Payer: Self-pay | Admitting: *Deleted

## 2016-03-19 NOTE — Telephone Encounter (Signed)
Left message on voicemail advising patient to continue taking Vitamin B12. It can be puchased over the counter.

## 2016-03-24 ENCOUNTER — Encounter: Payer: 59 | Admitting: Pain Medicine

## 2016-03-27 ENCOUNTER — Ambulatory Visit: Payer: 59 | Attending: Pain Medicine | Admitting: Pain Medicine

## 2016-03-27 ENCOUNTER — Encounter: Payer: Self-pay | Admitting: Pain Medicine

## 2016-03-27 VITALS — Ht 62.0 in | Wt 184.0 lb

## 2016-03-27 DIAGNOSIS — Z7982 Long term (current) use of aspirin: Secondary | ICD-10-CM | POA: Insufficient documentation

## 2016-03-27 DIAGNOSIS — Z8249 Family history of ischemic heart disease and other diseases of the circulatory system: Secondary | ICD-10-CM | POA: Insufficient documentation

## 2016-03-27 DIAGNOSIS — D472 Monoclonal gammopathy: Secondary | ICD-10-CM | POA: Insufficient documentation

## 2016-03-27 DIAGNOSIS — G894 Chronic pain syndrome: Secondary | ICD-10-CM | POA: Diagnosis not present

## 2016-03-27 DIAGNOSIS — M25552 Pain in left hip: Secondary | ICD-10-CM | POA: Insufficient documentation

## 2016-03-27 DIAGNOSIS — Z833 Family history of diabetes mellitus: Secondary | ICD-10-CM | POA: Insufficient documentation

## 2016-03-27 DIAGNOSIS — Z7984 Long term (current) use of oral hypoglycemic drugs: Secondary | ICD-10-CM | POA: Insufficient documentation

## 2016-03-27 DIAGNOSIS — Z91013 Allergy to seafood: Secondary | ICD-10-CM | POA: Diagnosis not present

## 2016-03-27 DIAGNOSIS — G8929 Other chronic pain: Secondary | ICD-10-CM | POA: Diagnosis not present

## 2016-03-27 DIAGNOSIS — I129 Hypertensive chronic kidney disease with stage 1 through stage 4 chronic kidney disease, or unspecified chronic kidney disease: Secondary | ICD-10-CM | POA: Diagnosis not present

## 2016-03-27 DIAGNOSIS — Z79899 Other long term (current) drug therapy: Secondary | ICD-10-CM | POA: Insufficient documentation

## 2016-03-27 DIAGNOSIS — M488X6 Other specified spondylopathies, lumbar region: Secondary | ICD-10-CM | POA: Diagnosis not present

## 2016-03-27 DIAGNOSIS — E782 Mixed hyperlipidemia: Secondary | ICD-10-CM | POA: Diagnosis not present

## 2016-03-27 DIAGNOSIS — N189 Chronic kidney disease, unspecified: Secondary | ICD-10-CM | POA: Insufficient documentation

## 2016-03-27 DIAGNOSIS — G4700 Insomnia, unspecified: Secondary | ICD-10-CM | POA: Diagnosis not present

## 2016-03-27 DIAGNOSIS — M47816 Spondylosis without myelopathy or radiculopathy, lumbar region: Secondary | ICD-10-CM

## 2016-03-27 DIAGNOSIS — Z87891 Personal history of nicotine dependence: Secondary | ICD-10-CM | POA: Insufficient documentation

## 2016-03-27 DIAGNOSIS — M810 Age-related osteoporosis without current pathological fracture: Secondary | ICD-10-CM | POA: Diagnosis not present

## 2016-03-27 DIAGNOSIS — Z8619 Personal history of other infectious and parasitic diseases: Secondary | ICD-10-CM | POA: Insufficient documentation

## 2016-03-27 DIAGNOSIS — Z79891 Long term (current) use of opiate analgesic: Secondary | ICD-10-CM | POA: Diagnosis not present

## 2016-03-27 DIAGNOSIS — J45909 Unspecified asthma, uncomplicated: Secondary | ICD-10-CM | POA: Diagnosis not present

## 2016-03-27 DIAGNOSIS — M1288 Other specific arthropathies, not elsewhere classified, other specified site: Secondary | ICD-10-CM | POA: Diagnosis not present

## 2016-03-27 DIAGNOSIS — Z5181 Encounter for therapeutic drug level monitoring: Secondary | ICD-10-CM | POA: Diagnosis not present

## 2016-03-27 DIAGNOSIS — Z888 Allergy status to other drugs, medicaments and biological substances status: Secondary | ICD-10-CM | POA: Insufficient documentation

## 2016-03-27 DIAGNOSIS — F329 Major depressive disorder, single episode, unspecified: Secondary | ICD-10-CM | POA: Diagnosis not present

## 2016-03-27 DIAGNOSIS — M545 Low back pain: Secondary | ICD-10-CM | POA: Insufficient documentation

## 2016-03-27 DIAGNOSIS — E1122 Type 2 diabetes mellitus with diabetic chronic kidney disease: Secondary | ICD-10-CM | POA: Insufficient documentation

## 2016-03-27 DIAGNOSIS — M25551 Pain in right hip: Secondary | ICD-10-CM | POA: Insufficient documentation

## 2016-03-27 DIAGNOSIS — Z885 Allergy status to narcotic agent status: Secondary | ICD-10-CM | POA: Insufficient documentation

## 2016-03-27 DIAGNOSIS — F1190 Opioid use, unspecified, uncomplicated: Secondary | ICD-10-CM

## 2016-03-27 DIAGNOSIS — E538 Deficiency of other specified B group vitamins: Secondary | ICD-10-CM | POA: Diagnosis not present

## 2016-03-27 DIAGNOSIS — M549 Dorsalgia, unspecified: Secondary | ICD-10-CM | POA: Diagnosis present

## 2016-03-27 MED ORDER — OXYCODONE HCL 5 MG PO TABS: 2.5000 mg | ORAL_TABLET | ORAL | 0 refills | Status: DC | PRN

## 2016-03-27 MED ORDER — ONDANSETRON 4 MG PO TBDP: 4.0000 mg | ORAL_TABLET | Freq: Three times a day (TID) | ORAL | 2 refills | Status: DC | PRN

## 2016-03-27 MED ORDER — CYANOCOBALAMIN 1500 MCG PO TBDP: 1.0000 | ORAL_TABLET | Freq: Every day | ORAL | 2 refills | Status: DC

## 2016-03-27 NOTE — Progress Notes (Signed)
Patient's Name: Stephanie Short  MRN: [id number]  Referring Provider: Kirk Ruths, MD  DOB: 1950-08-02  PCP: Kirk Ruths, MD  DOS: 03/27/2016  Note by: Kathlen Brunswick. Dossie Arbour, MD  Service setting: Ambulatory outpatient  Specialty: Interventional Pain Management  Location: [facility] (AMB) Pain Management Facility    Patient type: Established   Primary Reason(s) for Visit: Encounter for prescription drug management (Level of risk: moderate) CC: Back Pain  HPI  Stephanie Short is a 66 y.o. year old, female patient, who comes today for a medication management evaluation. She has HYPERLIPIDEMIA-MIXED; AORTIC ATHEROSCLEROSIS; Endometrial polyp; Chronic low back pain (Location of Primary Source of Pain) (Bilateral) (R>L); Lumbar spondylosis; Failed back surgical syndrome; Lumbar facet syndrome (Bilateral) (R>L); Long term current use of opiate analgesic; Long term prescription opiate use; Opiate use (22.5 MME/Day); Encounter for therapeutic drug level monitoring; Encounter for chronic pain management; Airway hyperreactivity; Atherosclerosis of abdominal aorta ([place]); Diabetes mellitus ([place]); Benign essential HTN; Cannot sleep; Major depression in remission ([place]); Burning or prickling sensation; Hemorrhage, postmenopausal; Pure hypercholesterolemia; Osteopenia; Chronic hip pain(2) (Bilateral) (L>R); Greater trochanteric bursitis of hips (Bilateral); Chronic sacroiliac joint pain (Bilateral); Osteoarthritis of hips (Bilateral); Pain management; Chronic pain of lower extremity  (Bilateral) (L>R); Postmenopausal osteoporosis; Chronic cervical radicular pain; Neurogenic pain; Chronic kidney disease; Vitamin B12 deficiency; Problem with medical care compliance; Poor historian; Asthma; Chronic neck pain (3) (B) (R>L); Monoclonal gammopathy; and Chronic pain syndrome on her problem list. Her primarily concern today is the Back Pain  Pain Assessment: Self-Reported Pain Score: 4 /10 Clinically the patient looks  like a 2/10 Reported level is inconsistent with clinical observations. Information on the proper use of the pain scale provided to the patient today Pain Type: Chronic pain Pain Location: Back Pain Orientation: Right, Left Pain Descriptors / Indicators: Aching, Dull, Spasm Pain Frequency: Constant  Stephanie Short was last scheduled for an appointment on 03/18/2016 for medication management. During today's appointment we reviewed [name]'s chronic pain status, as well as her outpatient medication regimen.  The patient  reports that she does not use drugs. Her body mass index is 33.65 kg/m.  Further details on both, my assessment(s), as well as the proposed treatment plan, please see below.  Controlled Substance Pharmacotherapy Assessment REMS (Risk Evaluation and Mitigation Strategy)  Analgesic:Oxycodone IR 2.15m, 1tablet every 4 hours PRN Maximum: 3/day. (7.556mday of oxycodone) MME/day:11.25 Mg/day  KoZenovia JarredRN  03/27/2016  2:27 PM  Signed Nursing Pain Medication Assessment:  Safety precautions to be maintained throughout the outpatient stay will include: orient to surroundings, keep bed in low position, maintain call bell within reach at all times, provide assistance with transfer out of bed and ambulation.  Medication Inspection Compliance: Pill count conducted under aseptic conditions, in front of the patient. Neither the pills nor the bottle was removed from the patient's sight at any time. Once count was completed pills were immediately returned to the patient in their original bottle.  Medication: Oxycodone IR Pill/Patch Count: 6 of 90 pills remain Bottle Appearance: Standard pharmacy container. Clearly labeled. Filled Date: 119 07 / 2018 Last Medication intake:  Today   Pharmacokinetics: Liberation and absorption (onset of action): WNL Distribution (time to peak effect): WNL Metabolism and excretion (duration of action): WNL         Pharmacodynamics: Desired  effects: Analgesia: Stephanie Short >50% benefit. Functional ability: Patient reports that medication allows her to accomplish basic ADLs Clinically meaningful improvement in function (CMIF): Sustained CMIF goals met Perceived  effectiveness: Described as relatively effective, allowing for increase in activities of daily living (ADL) Undesirable effects: Side-effects or Adverse reactions: None reported Monitoring: [place] PMP: Online review of the past 28-monthperiod conducted. Compliant with practice rules and regulations List of all UDS test(s) done:  Lab Results  Component Value Date   TOXASSSELUR FINAL 10/17/2015   TOXASSSELUR FINAL 05/23/2015   TOXASSSELUR FINAL 04/24/2015   [place] 01/31/2015   Last UDS on record: [employer]ox[employer] Select 13  Date Value Ref Range Status  10/17/2015 FINAL  Final    Comment:    ==================================================================== TOXASSURE SELECT 13 (MW) ==================================================================== Test                             Result       Flag       Units Drug Absent but Declared for Prescription Verification   Alprazolam                     Not Detected UNEXPECTED ng/mg creat   Oxycodone                      Not Detected UNEXPECTED ng/mg creat ==================================================================== Test                      Result    Flag   Units      Ref Range   Creatinine              133              mg/dL      >=20 ==================================================================== Declared Medications:  The flagging and interpretation on this report are based on the  following declared medications.  Unexpected results may arise from  inaccuracies in the declared medications.  **Note: The testing scope of this panel includes these medications:  Alprazolam (Xanax)  Oxycodone  **Note: The testing scope of this panel does not include following  reported medications:   Acetaminophen (Tylenol)  Albuterol (Proventil)  Aspirin (Aspirin 81)  Cyanocobalamin  Felodipine  Metformin  Ondansetron  Pantoprazole  Ranitidine  Spironolactone  Vitamin C  Zolpidem ==================================================================== For clinical consultation, please call ([phone] ====================================================================    UDS interpretation: Compliant          Medication Assessment Form: Reviewed. Patient indicates being compliant with therapy Treatment compliance: Compliant Risk Assessment Profile: Aberrant behavior: See prior evaluations. None observed or detected today Comorbid factors increasing risk of overdose: See prior notes. No additional risks detected today Risk of substance use disorder (SUD): Low Opioid Risk Tool (ORT) Total Score: 0  Interpretation Table:  Score <3 = Low Risk for SUD  Score between 4-7 = Moderate Risk for SUD  Score >8 = High Risk for Opioid Abuse   Risk Mitigation Strategies:  Patient Counseling: Covered Patient-Prescriber Agreement (PPA): Present and active  Notification to other healthcare providers: Done  Pharmacologic Plan: No change in therapy, at this time  Laboratory Chemistry  Inflammation Markers Lab Results  Component Value Date   CRP 0.6 04/25/2015   ESRSEDRATE 7 04/25/2015   (CRP: Acute Phase) (ESR: Chronic Phase) Renal Function Markers Lab Results  Component Value Date   BUN 23 (H) 12/21/2015   CREATININE 0.79 12/21/2015   GFRAA >60 12/21/2015   GFRNONAA >60 12/21/2015   Hepatic Function Markers Lab Results  Component Value Date   AST 25 12/21/2015   ALT 15 12/21/2015  ALBUMIN 4.3 12/21/2015   ALKPHOS 84 12/21/2015   Electrolytes Lab Results  Component Value Date   NA 138 12/21/2015   K 3.7 12/21/2015   CL 105 12/21/2015   CALCIUM 10.1 12/21/2015   MG 1.9 04/25/2015   Neuropathy Markers Lab Results  Component Value Date   VITAMINB12 178 (L)  04/25/2015   Bone Pathology Markers Lab Results  Component Value Date   ALKPHOS 84 12/21/2015   VD125OH2TOT 55 04/25/2015   [id number] 55 04/25/2015   VD2125OH2 <10 04/25/2015   CALCIUM 10.1 12/21/2015   Coagulation Parameters Lab Results  Component Value Date   INR 1.0 09/16/2012   LABPROT 13.3 09/16/2012   PLT 243 12/21/2015   Cardiovascular Markers Lab Results  Component Value Date   HGB 13.5 12/21/2015   HCT 40.4 12/21/2015   Note: Lab results reviewed.  Recent Diagnostic Imaging Review  Dg Bone Survey Met  Result Date: 12/28/2015 CLINICAL DATA:  Monoclonal gammopathy initial encounter EXAM: METASTATIC BONE SURVEY COMPARISON:  CXR 06/06/2015, chest, abdomen and pelvic CT from 12/01/2014, CT neck 01/28/2011, MRI 12/26/2010 of the lumbar spine. FINDINGS: No radiographically worrisome osteolytic lesions identified of the axial nor appendicular skeleton. Chest radiograph: Heart is normal in size. Aortic atherosclerosis is seen. There is atelectasis at the lung bases left greater than right. Findings are similar to 06/06/2015 CXR. Cervical spine: No osteolytic lesions. Disc space narrowing from C4 through C7 most marked between C5 and C7. Multilevel degenerative facet disease with uncovertebral osteoarthritis noted bilaterally at C3-4, C4-5, C5-6 and C6-7. Lateral skull:  Negative Bilateral shoulders: Negative Bilateral humeri: Negative Bilateral forearms: Negative Thoracic spine: Mild degenerative disc disease mid to upper thoracic spine. No osteolytic abnormality identified radiographically. Lumbar spine: Degenerative facet sclerosis L1 through S1. Disc spaces are maintained. No osteolytic abnormality of the vertebral bodies. Aortoiliac atherosclerosis without aneurysm. AP pelvis: No fracture or bone destruction. Tiny ovoid lucency overlying the left iliac bone is consistent with overlying bowel possibly diverticulum. Scattered phleboliths are seen in the pelvis bilaterally. Bilateral  lower extremities: Negative IMPRESSION: No radiographically worrisome osteolytic lesions identified of the axial or appendicular skeleton. Electronically Signed   By: Ashley Royalty M.D.   On: 12/28/2015 17:18   Note: Imaging results reviewed.          Meds  The patient has a current medication list which includes the following prescription(s): acetaminophen, albuterol, alprazolam, ascorbic acid, aspirin ec, fifty50 glucose meter 2.0, cyanocobalamin, felodipine, metformin, ondansetron, one touch ultra test, oxycodone, oxycodone, pantoprazole, ranitidine, spironolactone, trazodone, and zolpidem.  Current Outpatient Prescriptions on File Prior to Visit  Medication Sig  . acetaminophen (TYLENOL) 500 MG tablet Take 1,000-1,500 mg by mouth every 6 (six) hours as needed for mild pain.  Marland Kitchen albuterol (PROVENTIL HFA;VENTOLIN HFA) 108 (90 Base) MCG/ACT inhaler Inhale 2 puffs into the lungs every 6 (six) hours as needed for wheezing or shortness of breath.  . ALPRAZolam (XANAX) 0.25 MG tablet Take 0.125-0.25 mg by mouth 2 (two) times daily as needed for anxiety or sleep.  . Ascorbic Acid (VITAMIN C PO) Take 1 tablet by mouth at bedtime.  Marland Kitchen aspirin EC 81 MG tablet Take 81 mg by mouth at bedtime.  . felodipine (PLENDIL) 10 MG 24 hr tablet Take 10 mg by mouth at bedtime.  . metFORMIN (GLUCOPHAGE-XR) 500 MG 24 hr tablet Take 1,000 mg by mouth every evening.  Marland Kitchen spironolactone (ALDACTONE) 25 MG tablet Take 25 mg by mouth daily.   . traZODone (DESYREL) 50 MG tablet  Take 50 mg by mouth at bedtime as needed.   . zolpidem (AMBIEN) 5 MG tablet TAKE 1/2-1 TABLET BY MOUTH AT BEDTIME AS NEEDED FOR SLEEP   No current facility-administered medications on file prior to visit.    ROS  Constitutional: Denies any fever or chills Gastrointestinal: No reported hemesis, hematochezia, vomiting, or acute GI distress Musculoskeletal: Denies any acute onset joint swelling, redness, loss of ROM, or weakness Neurological: No reported  episodes of acute onset apraxia, aphasia, dysarthria, agnosia, amnesia, paralysis, loss of coordination, or loss of consciousness  Allergies  Ms. Fontan is allergic to codeine; dilaudid [hydromorphone hcl]; percocet [oxycodone-acetaminophen]; tramadol; gabapentin; hydrocodone-acetaminophen; hydromorphone; oxycodone-acetaminophen; promethazine; and scallops [shellfish allergy].  PFSH  Drug: Ms. Caloca  reports that she does not use drugs. Alcohol:  reports that she drinks alcohol. Tobacco:  reports that she quit smoking about 12 years ago. She has a 20.00 pack-year smoking history. She has never used smokeless tobacco. Medical:  has a past medical history of Asthma; Depression; Diabetes mellitus; Hepatitis C; Hypertension; and Insomnia. Family: family history includes Colon cancer in her other; Coronary artery disease (age of onset: 3) in her brother; Coronary artery disease (age of onset: 32) in her mother; Diabetes in her father and other; Heart attack in her brother; Heart disease in her father; Hypertension in her other.  Past Surgical History:  Procedure Laterality Date  . BACK SURGERY  2013   [facility] Dr. Mauri Pole  . COLONOSCOPY W/ POLYPECTOMY    . COLONOSCOPY WITH PROPOFOL N/A 03/13/2015   Procedure: COLONOSCOPY WITH PROPOFOL;  Surgeon: Lollie Sails, MD;  Location: [facility] ENDOSCOPY;  Service: Endoscopy;  Laterality: N/A;  . HYSTEROSCOPY W/D&C N/A 08/18/2014   Procedure: DILATATION AND CURETTAGE /HYSTEROSCOPY;  Surgeon: Lorette Ang, MD;  Location: [facility] ORS;  Service: Gynecology;  Laterality: N/A;  . KNEE ARTHROSCOPY Right    Constitutional Exam  General appearance: Well nourished, well developed, and well hydrated. In no apparent acute distress Vitals:   03/27/16 1419  Weight: 184 lb (83.5 kg)  Height: _0  (1.575 m)   BMI Assessment: Estimated body mass index is 33.65 kg/m as calculated from the following:   Height as of this encounter: _1  (1.575 m).   Weight as of this  encounter: 184 lb (83.5 kg).  BMI interpretation table: BMI level Category Range association with higher incidence of chronic pain  <18 kg/m2 Underweight   18.5-24.9 kg/m2 Ideal body weight   25-29.9 kg/m2 Overweight Increased incidence by 20%  30-34.9 kg/m2 Obese (Class I) Increased incidence by 68%  35-39.9 kg/m2 Severe obesity (Class II) Increased incidence by 136%  >40 kg/m2 Extreme obesity (Class III) Increased incidence by 254%   BMI Readings from Last 4 Encounters:  03/27/16 33.65 kg/m  01/29/16 30.93 kg/m  12/28/15 31.50 kg/m  12/21/15 30.94 kg/m   Wt Readings from Last 4 Encounters:  03/27/16 184 lb (83.5 kg)  01/29/16 183 lb (83 kg)  12/28/15 184 lb 8.4 oz (83.7 kg)  12/21/15 181 lb 3.5 oz (82.2 kg)  Psych/Mental status: Alert, oriented x 3 (person, place, & time)       Eyes: PERLA Respiratory: No evidence of acute respiratory distress  Cervical Spine Exam  Inspection: No masses, redness, or swelling Alignment: Symmetrical Functional ROM: Unrestricted ROM Stability: No instability detected Muscle strength & Tone: Functionally intact Sensory: Unimpaired Palpation: Non-contributory  Upper Extremity (UE) Exam    Side: Right upper extremity  Side: Left upper extremity  Inspection: No masses, redness,  swelling, or asymmetry. No contractures  Inspection: No masses, redness, swelling, or asymmetry. No contractures  Functional ROM: Unrestricted ROM          Functional ROM: Unrestricted ROM          Muscle strength & Tone: Functionally intact  Muscle strength & Tone: Functionally intact  Sensory: Unimpaired  Sensory: Unimpaired  Palpation: Euthermic  Palpation: Euthermic  Specialized Test(s): Deferred         Specialized Test(s): Deferred          Thoracic Spine Exam  Inspection: No masses, redness, or swelling Alignment: Symmetrical Functional ROM: Unrestricted ROM Stability: No instability detected Sensory: Unimpaired Muscle strength & Tone: Functionally  intact Palpation: Non-contributory  Lumbar Spine Exam  Inspection: No masses, redness, or swelling Alignment: Symmetrical Functional ROM: Unrestricted ROM Stability: No instability detected Muscle strength & Tone: Functionally intact Sensory: Unimpaired Palpation: Non-contributory Provocative Tests: Lumbar Hyperextension and rotation test: evaluation deferred today       Patrick's Maneuver: evaluation deferred today              Gait & Posture Assessment  Ambulation: Unassisted Gait: Relatively normal for age and body habitus Posture: WNL   Lower Extremity Exam    Side: Right lower extremity  Side: Left lower extremity  Inspection: No masses, redness, swelling, or asymmetry. No contractures  Inspection: No masses, redness, swelling, or asymmetry. No contractures  Functional ROM: Unrestricted ROM          Functional ROM: Unrestricted ROM          Muscle strength & Tone: Functionally intact  Muscle strength & Tone: Functionally intact  Sensory: Unimpaired  Sensory: Unimpaired  Palpation: No palpable anomalies  Palpation: No palpable anomalies   Assessment  Primary Diagnosis & Pertinent Problem List: The primary encounter diagnosis was Chronic pain syndrome. Diagnoses of Long term prescription opiate use, Opiate use (22.5 MME/Day), Chronic low back pain (Location of Primary Source of Pain) (Bilateral) (R>L), Lumbar facet syndrome (Bilateral) (R>L), and Vitamin B12 deficiency were also pertinent to this visit.  Status Diagnosis  Controlled Controlled Controlled 1. Chronic pain syndrome   2. Long term prescription opiate use   3. Opiate use (22.5 MME/Day)   4. Chronic low back pain (Location of Primary Source of Pain) (Bilateral) (R>L)   5. Lumbar facet syndrome (Bilateral) (R>L)   6. Vitamin B12 deficiency      Plan of Care  Pharmacotherapy (Medications Ordered): Meds ordered this encounter  Medications  . oxyCODONE (OXY IR/ROXICODONE) 5 MG immediate release tablet    Sig:  Take 0.5 tablets (2.5 mg total) by mouth every 4 (four) hours as needed for severe pain.    Dispense:  90 tablet    Refill:  0    Do not place this medication, or any other prescription from our practice, on "Automatic Refill". Patient may have prescription filled one day early if pharmacy is closed on scheduled refill date. Do not fill until: 03/29/16 To last until: 04/28/16  . oxyCODONE (OXY IR/ROXICODONE) 5 MG immediate release tablet    Sig: Take 0.5 tablets (2.5 mg total) by mouth every 4 (four) hours as needed for severe pain.    Dispense:  90 tablet    Refill:  0    Do not place this medication, or any other prescription from our practice, on "Automatic Refill". Patient may have prescription filled one day early if pharmacy is closed on scheduled refill date. Do not fill until: 04/28/16 To last until:  05/28/16  . ondansetron (ZOFRAN-ODT) 4 MG disintegrating tablet    Sig: Take 1 tablet (4 mg total) by mouth every 8 (eight) hours as needed for nausea or vomiting.    Dispense:  30 tablet    Refill:  2  . Cyanocobalamin (VITAMELTS ENERGY VITAMIN B-12) 1500 MCG TBDP    Sig: Take 1 tablet by mouth daily.    Dispense:  30 tablet    Refill:  2    Do not place this medication, or any other prescription from our practice, on "Automatic Refill".   New Prescriptions   No medications on file   Medications administered today: Ms. Stillion had no medications administered during this visit. Lab-work, procedure(s), and/or referral(s): Orders Placed This Encounter  Procedures  . LUMBAR FACET(MEDIAL BRANCH NERVE BLOCK) MBNB   Imaging and/or referral(s): None  Interventional therapies: Planned, scheduled, and/or pending:   Bilateral lumbar facet radiofrequency ablation, starting with the right side.    Considering:   Repeat diagnostic lumbar facet block under fluoroscopic guidance and IV sedation. Possible bilateral lumbar facet radiofrequency ablation Possible diagnostic bilateral  sacroiliac joint block under fluoroscopic guidance, with or without sedation.  Possible diagnostic bilateral intra-articular hip joint injection under fluoroscopic guidance, with or without sedation.    Palliative PRN treatment(s):   For the low back pain, bilateral diagnostic lumbar facet block + diagnostic bilateral sacroiliac joint injection under fluoroscopic guidance with IV sedation.  For the hip pain, diagnostic intra-articular hip joint injection under fluoroscopic guidance, with or without sedation.    Provider-requested follow-up: Return in about 2 months (around 05/27/2016) for (MD) Med-Mgmt.  Future Appointments Date Time Provider [place]  04/21/2016 10:45 AM Milinda Pointer, MD [facility] None  05/15/2016 1:00 PM Milinda Pointer, MD [facility] None  06/27/2016 2:00 PM [facility] None  06/27/2016 2:30 PM Lequita Asal, MD [facility] None   Primary Care Physician: Kirk Ruths, MD Location: [facility] Note by: Kathlen Brunswick. Dossie Arbour, M.D, DABA, DABAPM, DABPM, DABIPP, FIPP Date: 03/27/2016; Time: 9:25 AM  Pain Score Disclaimer: We use the NRS-11 scale. This is a self-reported, subjective measurement of pain severity with only modest accuracy. It is used primarily to identify changes within a particular patient. It must be understood that outpatient pain scales are significantly less accurate that those used for research, where they can be applied under ideal controlled circumstances with minimal exposure to variables. In reality, the score is likely to be a combination of pain intensity and pain affect, where pain affect describes the degree of emotional arousal or changes in action readiness caused by the sensory experience of pain. Factors such as social and work situation, setting, emotional state, anxiety levels, expectation, and prior pain experience may influence pain perception and show large inter-individual differences  that may also be affected by time variables.  Patient instructions provided during this appointment: Patient Instructions   You were given a prescription for oxycodone x 2 today.  You  Have scripts at the pharmacy for pick up.  Pre procedure instructions given for RF in April.   Pain Score  Introduction: The pain score used by this practice is the Verbal Numerical Rating Scale (VNRS-11). This is an 11-point scale. It is for adults and children 10 years or older. There are significant differences in how the pain score is reported, used, and applied. Forget everything you learned in the past and learn this scoring system.  General Information: The scale should reflect your current level of  pain. Unless you are specifically asked for the level of your worst pain, or your average pain. If you are asked for one of these two, then it should be understood that it is over the past 24 hours.  Basic Activities of Daily Living (ADL): Personal hygiene, dressing, eating, transferring, and using restroom.  Instructions: Most patients tend to report their level of pain as a combination of two factors, their physical pain and their psychosocial pain. This last one is also known as "suffering" and it is reflection of how physical pain affects you socially and psychologically. From now on, report them separately. From this point on, when asked to report your pain level, report only your physical pain. Use the following table for reference.  Pain Clinic Pain Levels (0-5/10)  Pain Level Score Description  No Pain 0   Mild pain 1 Nagging, annoying, but does not interfere with basic activities of daily living (ADL). Patients are able to eat, bathe, get dressed, toileting (being able to get on and off the toilet and perform personal hygiene functions), transfer (move in and out of bed or a chair without assistance), and maintain continence (able to control bladder and bowel functions). Blood pressure and heart rate  are unaffected. A normal heart rate for a healthy adult ranges from 60 to 100 bpm (beats per minute).   Mild to moderate pain 2 Noticeable and distracting. Impossible to hide from other people. More frequent flare-ups. Still possible to adapt and function close to normal. It can be very annoying and may have occasional stronger flare-ups. With discipline, patients may get used to it and adapt.   Moderate pain 3 Interferes significantly with activities of daily living (ADL). It becomes difficult to feed, bathe, get dressed, get on and off the toilet or to perform personal hygiene functions. Difficult to get in and out of bed or a chair without assistance. Very distracting. With effort, it can be ignored when deeply involved in activities.   Moderately severe pain 4 Impossible to ignore for more than a few minutes. With effort, patients may still be able to manage work or participate in some social activities. Very difficult to concentrate. Signs of autonomic nervous system discharge are evident: dilated pupils (mydriasis); mild sweating (diaphoresis); sleep interference. Heart rate becomes elevated (>115 bpm). Diastolic blood pressure (lower number) rises above 100 mmHg. Patients find relief in laying down and not moving.   Severe pain 5 Intense and extremely unpleasant. Associated with frowning face and frequent crying. Pain overwhelms the senses.  Ability to do any activity or maintain social relationships becomes significantly limited. Conversation becomes difficult. Pacing back and forth is common, as getting into a comfortable position is nearly impossible. Pain wakes you up from deep sleep. Physical signs will be obvious: pupillary dilation; increased sweating; goosebumps; brisk reflexes; cold, clammy hands and feet; nausea, vomiting or dry heaves; loss of appetite; significant sleep disturbance with inability to fall asleep or to remain asleep. When persistent, significant weight loss is observed due  to the complete loss of appetite and sleep deprivation.  Blood pressure and heart rate becomes significantly elevated. Caution: If elevated blood pressure triggers a pounding headache associated with blurred vision, then the patient should immediately seek attention at an urgent or emergency care unit, as these may be signs of an impending stroke.    Emergency Department Pain Levels (6-10/10)  Emergency Room Pain 6 Severely limiting. Requires emergency care and should not be seen or managed at an outpatient pain  management facility. Communication becomes difficult and requires great effort. Assistance to reach the emergency department may be required. Facial flushing and profuse sweating along with potentially dangerous increases in heart rate and blood pressure will be evident.   Distressing pain 7 Self-care is very difficult. Assistance is required to transport, or use restroom. Assistance to reach the emergency department will be required. Tasks requiring coordination, such as bathing and getting dressed become very difficult.   Disabling pain 8 Self-care is no longer possible. At this level, pain is disabling. The individual is unable to do even the most "basic" activities such as walking, eating, bathing, dressing, transferring to a bed, or toileting. Fine motor skills are lost. It is difficult to think clearly.   Incapacitating pain 9 Pain becomes incapacitating. Thought processing is no longer possible. Difficult to remember your own name. Control of movement and coordination are lost.   The worst pain imaginable 10 At this level, most patients pass out from pain. When this level is reached, collapse of the autonomic nervous system occurs, leading to a sudden drop in blood pressure and heart rate. This in turn results in a temporary and dramatic drop in blood flow to the brain, leading to a loss of consciousness. Fainting is one of the body's self defense mechanisms. Passing out puts the brain in  a calmed state and causes it to shut down for a while, in order to begin the healing process.    Summary: 1. Refer to this scale when providing [place] with your pain level. 2. Be accurate and careful when reporting your pain level. This will help with your care. 3. Over-reporting your pain level will lead to loss of credibility. 4. Even a level of 1/10 means that there is pain and will be treated at our facility. 5. High, inaccurate reporting will be documented as "Symptom Exaggeration", leading to loss of credibility and suspicions of possible secondary gains such as obtaining more narcotics, or wanting to appear disabled, for fraudulent reasons. 6. Only pain levels of 5 or below will be seen at our facility. 7. Pain levels of 6 and above will be sent to the Emergency Department and the appointment cancelled. _____________________________________________________________________________________________

## 2016-03-27 NOTE — Progress Notes (Signed)
Nursing Pain Medication Assessment:  Safety precautions to be maintained throughout the outpatient stay will include: orient to surroundings, keep bed in low position, maintain call bell within reach at all times, provide assistance with transfer out of bed and ambulation.  Medication Inspection Compliance: Pill count conducted under aseptic conditions, in front of the patient. Neither the pills nor the bottle was removed from the patient's sight at any time. Once count was completed pills were immediately returned to the patient in their original bottle.  Medication: Oxycodone IR Pill/Patch Count: 6 of 90 pills remain Bottle Appearance: Standard pharmacy container. Clearly labeled. Filled Date: 61 / 07 / 2018 Last Medication intake:  Today

## 2016-03-27 NOTE — Patient Instructions (Addendum)
You were given a prescription for oxycodone x 2 today.  You  Have scripts at the pharmacy for pick up.  Pre procedure instructions given for RF in April.   Pain Score  Introduction: The pain score used by this practice is the Verbal Numerical Rating Scale (VNRS-11). This is an 11-point scale. It is for adults and children 10 years or older. There are significant differences in how the pain score is reported, used, and applied. Forget everything you learned in the past and learn this scoring system.  General Information: The scale should reflect your current level of pain. Unless you are specifically asked for the level of your worst pain, or your average pain. If you are asked for one of these two, then it should be understood that it is over the past 24 hours.  Basic Activities of Daily Living (ADL): Personal hygiene, dressing, eating, transferring, and using restroom.  Instructions: Most patients tend to report their level of pain as a combination of two factors, their physical pain and their psychosocial pain. This last one is also known as "suffering" and it is reflection of how physical pain affects you socially and psychologically. From now on, report them separately. From this point on, when asked to report your pain level, report only your physical pain. Use the following table for reference.  Pain Clinic Pain Levels (0-5/10)  Pain Level Score Description  No Pain 0   Mild pain 1 Nagging, annoying, but does not interfere with basic activities of daily living (ADL). Patients are able to eat, bathe, get dressed, toileting (being able to get on and off the toilet and perform personal hygiene functions), transfer (move in and out of bed or a chair without assistance), and maintain continence (able to control bladder and bowel functions). Blood pressure and heart rate are unaffected. A normal heart rate for a healthy adult ranges from 60 to 100 bpm (beats per minute).   Mild to moderate pain 2  Noticeable and distracting. Impossible to hide from other people. More frequent flare-ups. Still possible to adapt and function close to normal. It can be very annoying and may have occasional stronger flare-ups. With discipline, patients may get used to it and adapt.   Moderate pain 3 Interferes significantly with activities of daily living (ADL). It becomes difficult to feed, bathe, get dressed, get on and off the toilet or to perform personal hygiene functions. Difficult to get in and out of bed or a chair without assistance. Very distracting. With effort, it can be ignored when deeply involved in activities.   Moderately severe pain 4 Impossible to ignore for more than a few minutes. With effort, patients may still be able to manage work or participate in some social activities. Very difficult to concentrate. Signs of autonomic nervous system discharge are evident: dilated pupils (mydriasis); mild sweating (diaphoresis); sleep interference. Heart rate becomes elevated (>115 bpm). Diastolic blood pressure (lower number) rises above 100 mmHg. Patients find relief in laying down and not moving.   Severe pain 5 Intense and extremely unpleasant. Associated with frowning face and frequent crying. Pain overwhelms the senses.  Ability to do any activity or maintain social relationships becomes significantly limited. Conversation becomes difficult. Pacing back and forth is common, as getting into a comfortable position is nearly impossible. Pain wakes you up from deep sleep. Physical signs will be obvious: pupillary dilation; increased sweating; goosebumps; brisk reflexes; cold, clammy hands and feet; nausea, vomiting or dry heaves; loss of appetite; significant sleep  disturbance with inability to fall asleep or to remain asleep. When persistent, significant weight loss is observed due to the complete loss of appetite and sleep deprivation.  Blood pressure and heart rate becomes significantly elevated. Caution:  If elevated blood pressure triggers a pounding headache associated with blurred vision, then the patient should immediately seek attention at an urgent or emergency care unit, as these may be signs of an impending stroke.    Emergency Department Pain Levels (6-10/10)  Emergency Room Pain 6 Severely limiting. Requires emergency care and should not be seen or managed at an outpatient pain management facility. Communication becomes difficult and requires great effort. Assistance to reach the emergency department may be required. Facial flushing and profuse sweating along with potentially dangerous increases in heart rate and blood pressure will be evident.   Distressing pain 7 Self-care is very difficult. Assistance is required to transport, or use restroom. Assistance to reach the emergency department will be required. Tasks requiring coordination, such as bathing and getting dressed become very difficult.   Disabling pain 8 Self-care is no longer possible. At this level, pain is disabling. The individual is unable to do even the most "basic" activities such as walking, eating, bathing, dressing, transferring to a bed, or toileting. Fine motor skills are lost. It is difficult to think clearly.   Incapacitating pain 9 Pain becomes incapacitating. Thought processing is no longer possible. Difficult to remember your own name. Control of movement and coordination are lost.   The worst pain imaginable 10 At this level, most patients pass out from pain. When this level is reached, collapse of the autonomic nervous system occurs, leading to a sudden drop in blood pressure and heart rate. This in turn results in a temporary and dramatic drop in blood flow to the brain, leading to a loss of consciousness. Fainting is one of the body's self defense mechanisms. Passing out puts the brain in a calmed state and causes it to shut down for a while, in order to begin the healing process.    Summary: 1. Refer to this  scale when providing [place] with your pain level. 2. Be accurate and careful when reporting your pain level. This will help with your care. 3. Over-reporting your pain level will lead to loss of credibility. 4. Even a level of 1/10 means that there is pain and will be treated at our facility. 5. High, inaccurate reporting will be documented as "Symptom Exaggeration", leading to loss of credibility and suspicions of possible secondary gains such as obtaining more narcotics, or wanting to appear disabled, for fraudulent reasons. 6. Only pain levels of 5 or below will be seen at our facility. 7. Pain levels of 6 and above will be sent to the Emergency Department and the appointment cancelled. _____________________________________________________________________________________________

## 2016-04-21 ENCOUNTER — Ambulatory Visit: Payer: 59 | Admitting: Pain Medicine

## 2016-05-15 ENCOUNTER — Encounter: Payer: 59 | Admitting: Pain Medicine

## 2016-06-03 ENCOUNTER — Encounter: Payer: Self-pay | Admitting: Emergency Medicine

## 2016-06-03 ENCOUNTER — Other Ambulatory Visit: Payer: Self-pay

## 2016-06-03 ENCOUNTER — Emergency Department: Payer: 59

## 2016-06-03 ENCOUNTER — Emergency Department
Admission: EM | Admit: 2016-06-03 | Discharge: 2016-06-03 | Disposition: A | Discharge status: Home / Self Care | Payer: 59 | Attending: Emergency Medicine | Admitting: Emergency Medicine

## 2016-06-03 DIAGNOSIS — N189 Chronic kidney disease, unspecified: Secondary | ICD-10-CM | POA: Diagnosis not present

## 2016-06-03 DIAGNOSIS — J45909 Unspecified asthma, uncomplicated: Secondary | ICD-10-CM | POA: Diagnosis not present

## 2016-06-03 DIAGNOSIS — Z7982 Long term (current) use of aspirin: Secondary | ICD-10-CM | POA: Diagnosis not present

## 2016-06-03 DIAGNOSIS — R5381 Other malaise: Secondary | ICD-10-CM | POA: Diagnosis not present

## 2016-06-03 DIAGNOSIS — Z7984 Long term (current) use of oral hypoglycemic drugs: Secondary | ICD-10-CM | POA: Diagnosis not present

## 2016-06-03 DIAGNOSIS — Z79899 Other long term (current) drug therapy: Secondary | ICD-10-CM | POA: Insufficient documentation

## 2016-06-03 DIAGNOSIS — Z87891 Personal history of nicotine dependence: Secondary | ICD-10-CM | POA: Insufficient documentation

## 2016-06-03 DIAGNOSIS — R0789 Other chest pain: Secondary | ICD-10-CM | POA: Diagnosis not present

## 2016-06-03 DIAGNOSIS — I129 Hypertensive chronic kidney disease with stage 1 through stage 4 chronic kidney disease, or unspecified chronic kidney disease: Secondary | ICD-10-CM | POA: Insufficient documentation

## 2016-06-03 DIAGNOSIS — R110 Nausea: Secondary | ICD-10-CM | POA: Diagnosis not present

## 2016-06-03 DIAGNOSIS — R5383 Other fatigue: Secondary | ICD-10-CM | POA: Diagnosis not present

## 2016-06-03 DIAGNOSIS — E1122 Type 2 diabetes mellitus with diabetic chronic kidney disease: Secondary | ICD-10-CM | POA: Insufficient documentation

## 2016-06-03 LAB — BASIC METABOLIC PANEL
Anion gap: 8 (ref 5–15)
BUN: 17 mg/dL (ref 6–20)
CHLORIDE: 105 mmol/L (ref 101–111)
CO2: 23 mmol/L (ref 22–32)
CREATININE: 0.72 mg/dL (ref 0.44–1.00)
Calcium: 9.4 mg/dL (ref 8.9–10.3)
GFR calc non Af Amer: >60 mL/min (ref >60)
Glucose, Bld: 176 mg/dL — ABNORMAL HIGH (ref 65–99)
POTASSIUM: 4.1 mmol/L (ref 3.5–5.1)
SODIUM: 136 mmol/L (ref 135–145)

## 2016-06-03 LAB — TROPONIN I
Troponin I: <0.03 ng/mL (ref <0.03)
Troponin I: <0.03 ng/mL (ref <0.03)

## 2016-06-03 LAB — CBC
HCT: 39.9 % (ref 35.0–47.0)
Hemoglobin: 13.2 g/dL (ref 12.0–16.0)
MCH: 26.3 pg (ref 26.0–34.0)
MCHC: 32.9 g/dL (ref 32.0–36.0)
MCV: 80 fL (ref 80.0–100.0)
Platelets: 234 10*3/uL (ref 150–440)
RBC: 5 MIL/uL (ref 3.80–5.20)
RDW: 16.9 % — ABNORMAL HIGH (ref 11.5–14.5)
WBC: 6.9 10*3/uL (ref 3.6–11.0)

## 2016-06-03 MED ORDER — ONDANSETRON 4 MG PO TBDP: ORAL_TABLET | ORAL | 0 refills | Status: DC

## 2016-06-03 MED ORDER — GI COCKTAIL ~~LOC~~
30.0000 mL | Freq: Once | ORAL | Status: AC
  Administered 2016-06-03: 30 mL via ORAL
  Filled 2016-06-03: qty 30

## 2016-06-03 NOTE — ED Triage Notes (Signed)
Pt c/o central chest pain with nausea starting today as intermittent and now constant. Denies any other sx including SHOB.  Skin warm and dry. NAD. VSS. No cardiac hx. Does have HTN

## 2016-06-03 NOTE — Discharge Instructions (Signed)
As we discussed, your workup today was reassuring.  Though we do not know exactly what is causing your symptoms, it appears that you have no emergent medical condition at this time and that you are safe to go home and follow up as recommended in this paperwork.  Please return immediately to the Emergency Department if you develop any new or worsening symptoms that concern you.

## 2016-06-03 NOTE — ED Provider Notes (Signed)
[facility] Emergency Department Provider Note  ____________________________________________   First MD Initiated Contact with Patient 06/03/16 1442     (approximate)  I have reviewed the triage vital signs and the nursing notes.   HISTORY  Chief Complaint Chest Pain    HPI Stephanie Short is a 66 y.o. female with a medical history as described below who presents for evaluation of a gradually worseningconstellation of symptoms.  This includes some central chest pain that she believes may be related to her GI system, nausea, generalized weakness, general malaise, and abdominal cramping.  She states that she was in her normal state of health yesterday and felt okay when she woke up but then gradually started developing mild symptoms as described above.  Her symptoms gradually got worse and she took both her ranitidine and Zofran to help him feel better.  She was going to go out with her husband but when she got up to go out she felt lightheaded and generally weak and "just bad".  She was nauseated but did not vomit.  She denies diarrhea.  She has had some generalized cramping abdominal pain.  She states that she has some discomfort in her chest but that it was more of a burning sensation and does not describe it as chest pain.  She says the symptoms were severe but now that she is here they are almost gone but she is concerned because of how she felt before.  Her stomach still feels unsettled.  Nothing in particular made the symptoms worse and the Zofran and ranitidine may have made them better.  Past Medical History:  Diagnosis Date  . Asthma   . Depression   . Diabetes mellitus   . Hepatitis C    Dr. 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 Hypertension   . Insomnia     Patient Active Problem List   Diagnosis Date Noted  . Chronic pain syndrome 01/29/2016  . Monoclonal gammopathy 12/21/2015  . Chronic neck pain (3) (B) (R>L) 10/17/2015  .  Problem with medical care compliance 05/23/2015  . Poor historian 05/23/2015  . Vitamin B12 deficiency 04/28/2015  . Chronic hip pain(2) (Bilateral) (L>R) 02/01/2015  . Greater trochanteric bursitis of hips (Bilateral) 02/01/2015  . Chronic sacroiliac joint pain (Bilateral) 02/01/2015  . Osteoarthritis of hips (Bilateral) 02/01/2015  . Pain management 02/01/2015  . Chronic pain of lower extremity  (Bilateral) (L>R) 02/01/2015  . Postmenopausal osteoporosis 02/01/2015  . Chronic cervical radicular pain 02/01/2015  . Neurogenic pain 02/01/2015  . Chronic kidney disease 02/01/2015  . Chronic low back pain (Location of Primary Source of Pain) (Bilateral) (R>L) 01/31/2015  . Lumbar spondylosis 01/31/2015  . 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 Lumbar facet syndrome (Bilateral) (R>L) 01/31/2015  . Long term current use of opiate analgesic 01/31/2015  . Long term prescription opiate use 01/31/2015  . Opiate use (22.5 MME/Day) 01/31/2015  . Encounter for therapeutic drug level monitoring 01/31/2015  . Encounter for chronic pain management 01/31/2015  . Osteopenia 01/31/2015  . Endometrial polyp 08/15/2014  . Hemorrhage, postmenopausal 08/02/2014  . Pure hypercholesterolemia 05/23/2014  . Major depression in remission ([place]) 01/11/2014  . Atherosclerosis of abdominal aorta ([place]) 12/31/2013  . Burning or prickling sensation 06/29/2013  . Airway hyperreactivity 06/12/2013  . Diabetes mellitus ([place]) 06/12/2013  . Benign essential HTN 06/12/2013  . Cannot sleep 06/12/2013  . Asthma 06/12/2013  . HYPERLIPIDEMIA-MIXED 10/29/2009  . AORTIC ATHEROSCLEROSIS 10/29/2009  Past Surgical History:  Procedure Laterality Date  . BACK SURGERY  2013   [facility] Dr. Mauri Pole  . COLONOSCOPY W/ POLYPECTOMY    . COLONOSCOPY WITH PROPOFOL N/A 03/13/2015   Procedure: COLONOSCOPY WITH PROPOFOL;  Surgeon: Lollie Sails, MD;  Location: [facility] ENDOSCOPY;  Service: Endoscopy;  Laterality: N/A;  .  HYSTEROSCOPY W/D&C N/A 08/18/2014   Procedure: DILATATION AND CURETTAGE /HYSTEROSCOPY;  Surgeon: Lorette Ang, MD;  Location: [facility] ORS;  Service: Gynecology;  Laterality: N/A;  . KNEE ARTHROSCOPY Right     Prior to Admission medications   Medication Sig Start Date End Date Taking? Authorizing Provider  acetaminophen (TYLENOL) 500 MG tablet Take 1,000-1,500 mg by mouth every 6 (six) hours as needed for mild pain.    [provider]  albuterol (PROVENTIL HFA;VENTOLIN HFA) 108 (90 Base) MCG/ACT inhaler Inhale 2 puffs into the lungs every 6 (six) hours as needed for wheezing or shortness of breath.    [provider]  ALPRAZolam Duanne Moron) 0.25 MG tablet Take 0.125-0.25 mg by mouth 2 (two) times daily as needed for anxiety or sleep.    [provider]  Ascorbic Acid (VITAMIN C PO) Take 1 tablet by mouth at bedtime.    [provider]  aspirin EC 81 MG tablet Take 81 mg by mouth at bedtime.    [provider]  Blood Glucose Monitoring Suppl (FIFTY50 GLUCOSE METER 2.0) w/Device KIT Use as directed. Dx E11.22 ONE TOUCH 02/22/16   [provider]  Cyanocobalamin (VITAMELTS ENERGY VITAMIN B-12) 1500 MCG TBDP Take 1 tablet by mouth daily. 03/29/16 06/27/16  Milinda Pointer, MD  felodipine (PLENDIL) 10 MG 24 hr tablet Take 10 mg by mouth at bedtime.    [provider]  metFORMIN (GLUCOPHAGE-XR) 500 MG 24 hr tablet Take 1,000 mg by mouth every evening.    [provider]  ondansetron (ZOFRAN ODT) 4 MG disintegrating tablet Allow 1-2 tablets to dissolve in your mouth every 8 hours as needed for nausea/vomiting 06/03/16   Hinda Kehr, MD  ONE TOUCH ULTRA TEST test strip USE TO CHECK BLOOD SUGAR 1-2 TIMES DAILY AS DIRECTED. DX E11.22. 02/22/16   [provider]  oxyCODONE (OXY IR/ROXICODONE) 5 MG immediate release tablet Take 0.5 tablets (2.5 mg total) by mouth every 4 (four) hours as needed for severe pain. 03/29/16 04/28/16  Milinda Pointer, MD  oxyCODONE (OXY IR/ROXICODONE) 5 MG immediate release tablet Take 0.5 tablets (2.5 mg total) by mouth every 4 (four) hours as needed for severe pain. 04/28/16 05/28/16  Milinda Pointer, MD  pantoprazole (PROTONIX) 40 MG tablet Take by mouth.    [provider]  ranitidine (ZANTAC) 150 MG tablet TAKE 1 TABLET (150 MG TOTAL) BY MOUTH 2 (TWO) TIMES DAILY. AS NEEDED 02/25/16   [provider]  spironolactone (ALDACTONE) 25 MG tablet Take 25 mg by mouth daily.     [provider]  traZODone (DESYREL) 50 MG tablet Take 50 mg by mouth at bedtime as needed.  01/29/16 01/28/17  [provider]  zolpidem (AMBIEN) 5 MG tablet TAKE 1/2-1 TABLET BY MOUTH AT BEDTIME AS NEEDED FOR SLEEP 10/16/15   [provider]    Allergies Codeine; Dilaudid [hydromorphone hcl]; Percocet [oxycodone-acetaminophen]; Tramadol; Gabapentin; Hydrocodone-acetaminophen; Hydromorphone; Oxycodone-acetaminophen; Promethazine; and Scallops [shellfish allergy]  Family History  Problem Relation Age of Onset  . Coronary artery disease Mother 30  . Coronary artery disease Brother 81  . Heart attack Brother        MI  .  Heart disease Father   . Diabetes Father   . Diabetes Other   . Hypertension Other   . Colon cancer Other     Social History Social History  Substance Use Topics  . Smoking status: Former Smoker    Packs/day: 1.00    Years: 20.00    Quit date: 01/21/2004  . Smokeless tobacco: Never Used  . Alcohol use 0.0 - 0.6 oz/week     Comment: Social    Review of Systems Constitutional: No fever/chills Eyes: No visual changes. ENT: No sore throat. Cardiovascular: Chest discomfort possibly related to GI distress Respiratory: Denies shortness of breath. Gastrointestinal: Abdominal cramping and discomfort with nausea but no vomiting and no diarrhea Genitourinary: Negative for dysuria. Musculoskeletal: Negative for back pain. Integumentary: Negative for  rash. Neurological: Negative for headaches, focal weakness or numbness.   ____________________________________________   PHYSICAL EXAM:  VITAL SIGNS: ED Triage Vitals [06/03/16 1248]  Enc Vitals Group     BP (!) 150/86     Pulse Rate 87     Resp 20     Temp 97.8 F (36.6 C)     Temp Source Oral     SpO2 97 %     Weight 180 lb (81.6 kg)     Height _0  (1.6 m)     Head Circumference      Peak Flow      Pain Score 5     Pain Loc      Pain Edu?      Excl. in [place]?     Constitutional: Alert and oriented. Well appearing and in no acute distress. Eyes: Conjunctivae are normal. PERRL. EOMI. Head: Atraumatic. Nose: No congestion/rhinnorhea. Mouth/Throat: Mucous membranes are moist. Neck: No stridor.  No meningeal signs.   Cardiovascular: Normal rate, regular rhythm. Good peripheral circulation. Grossly normal heart sounds. Respiratory: Normal respiratory effort.  No retractions. Lungs CTAB. Gastrointestinal: Obese.  Soft and nontender even to deep palpation. No distention.  Musculoskeletal: No lower extremity tenderness nor edema. No gross deformities of extremities. Neurologic:  Normal speech and language. No gross focal neurologic deficits are appreciated.  Normal strength in upper and lower extremities with no evidence of weakness or sensory deficits.   Skin:  Skin is warm, dry and intact. No rash noted. Psychiatric: Mood and affect are normal. Speech and behavior are normal.  ____________________________________________   LABS (all labs ordered are listed, but only abnormal results are displayed)  Labs Reviewed  BASIC METABOLIC PANEL - Abnormal; Notable for the following:       Result Value   Glucose, Bld 176 (*)    All other components within normal limits  CBC - Abnormal; Notable for the following:    RDW 16.9 (*)    All other components within normal limits  TROPONIN I  TROPONIN I   ____________________________________________  EKG  ED ECG REPORT I,  Haskell Rihn, the attending physician, personally viewed and interpreted this ECG.  Date: 06/03/2016 EKG Time: 12:44 PM Rate: 90 Rhythm: normal sinus rhythm QRS Axis: normal Intervals: normal ST/T Wave abnormalities: normal Conduction Disturbances: none Narrative Interpretation: unremarkable  ____________________________________________  RADIOLOGY   Dg Chest 2 View  Result Date: 06/03/2016 CLINICAL DATA:  Central chest pain EXAM: CHEST  2 VIEW COMPARISON:  12/28/2015 FINDINGS: The heart size and mediastinal contours are within normal limits. Both lungs are clear. The visualized skeletal structures are unremarkable. IMPRESSION: No active cardiopulmonary disease. Electronically Signed   By: Linus Mako.D.  On: 06/03/2016 13:14    ____________________________________________   PROCEDURES  Critical Care performed: No   Procedure(s) performed:   Procedures   ____________________________________________   INITIAL IMPRESSION / ASSESSMENT AND PLAN / ED COURSE  Pertinent labs & imaging results that were available during my care of the patient were reviewed by me and considered in my medical decision making (see chart for details).  The patient is very well-appearing and in no acute distress.  Other than some mild hypertension her vital signs are normal.  She has no gross focal neurological deficits and her signs and symptoms are not consistent with cardiac abnormalities.  She has a Wells Score for PE of zero and a HEART score of 4 based on her age and multiple risk factors.  However, her symptoms are much more consistent with GI issues than cardiac.  We will provide a GI cocktail to see if this settles her stomach and we will repeat a troponin.  No indication for additional imaging.  If second troponin negative, patient should be safe to follow up as an outpatient.   Clinical Course as of Jun 04 1847  Tue Jun 03, 2016  1505 OBSERVATION CARE: This patient is being placed  under observation care for the following reasons: Chest pain with repeat testing to rule out ischemia   [CF]  1710 END OF OBSERVATION STATUS: After an appropriate period of observation, this patient is being discharged due to the following reason(s):  second negative troponin, stable vitals.  The patient still reports that she does not feel well but she continues to have stable vital signs and a reassuring lab workup.  She has no abdominal tenderness to palpation and no ongoing chest pain.  I believe her symptoms are GI related and most likely related to a viral illness.  I gave her my usual and customary return precautions and she and her husband are comfortable with the plan to follow up with her outpatient doctor at the next available opportunity.    [CF]    Clinical Course User Index [CF] Hinda Kehr, MD    ____________________________________________  FINAL CLINICAL IMPRESSION(S) / ED DIAGNOSES  Final diagnoses:  Nausea  Malaise and fatigue  Atypical chest pain     MEDICATIONS GIVEN DURING THIS VISIT:  Medications  gi cocktail (Maalox,Lidocaine,Donnatal) (30 mLs Oral Given 06/03/16 1503)     NEW OUTPATIENT MEDICATIONS STARTED DURING THIS VISIT:  Discharge Medication List as of 06/03/2016  5:12 PM      Discharge Medication List as of 06/03/2016  5:12 PM    CONTINUE these medications which have CHANGED   Details  ondansetron (ZOFRAN ODT) 4 MG disintegrating tablet Allow 1-2 tablets to dissolve in your mouth every 8 hours as needed for nausea/vomiting, Print        Discharge Medication List as of 06/03/2016  5:12 PM       Note:  This document was prepared using Dragon voice recognition software and may include unintentional dictation errors.    Hinda Kehr, MD 06/03/16 [phone]

## 2016-06-12 ENCOUNTER — Encounter: Payer: Self-pay | Admitting: Nurse Practitioner

## 2016-06-12 ENCOUNTER — Ambulatory Visit: Payer: 59 | Attending: Pain Medicine | Admitting: Nurse Practitioner

## 2016-06-12 VITALS — BP 139/74 | HR 102 | Temp 97.8°F | Resp 16 | Ht 63.0 in | Wt 183.0 lb

## 2016-06-12 DIAGNOSIS — M7071 Other bursitis of hip, right hip: Secondary | ICD-10-CM | POA: Diagnosis not present

## 2016-06-12 DIAGNOSIS — M4696 Unspecified inflammatory spondylopathy, lumbar region: Secondary | ICD-10-CM

## 2016-06-12 DIAGNOSIS — E782 Mixed hyperlipidemia: Secondary | ICD-10-CM | POA: Diagnosis not present

## 2016-06-12 DIAGNOSIS — M545 Low back pain: Secondary | ICD-10-CM | POA: Diagnosis not present

## 2016-06-12 DIAGNOSIS — M47816 Spondylosis without myelopathy or radiculopathy, lumbar region: Secondary | ICD-10-CM

## 2016-06-12 DIAGNOSIS — G894 Chronic pain syndrome: Secondary | ICD-10-CM | POA: Diagnosis not present

## 2016-06-12 DIAGNOSIS — E538 Deficiency of other specified B group vitamins: Secondary | ICD-10-CM | POA: Diagnosis not present

## 2016-06-12 DIAGNOSIS — G8929 Other chronic pain: Secondary | ICD-10-CM

## 2016-06-12 DIAGNOSIS — E119 Type 2 diabetes mellitus without complications: Secondary | ICD-10-CM | POA: Diagnosis not present

## 2016-06-12 DIAGNOSIS — I700 Atherosclerosis of aorta: Secondary | ICD-10-CM | POA: Diagnosis not present

## 2016-06-12 DIAGNOSIS — I129 Hypertensive chronic kidney disease with stage 1 through stage 4 chronic kidney disease, or unspecified chronic kidney disease: Secondary | ICD-10-CM | POA: Insufficient documentation

## 2016-06-12 DIAGNOSIS — Z79891 Long term (current) use of opiate analgesic: Secondary | ICD-10-CM

## 2016-06-12 DIAGNOSIS — I10 Essential (primary) hypertension: Secondary | ICD-10-CM | POA: Insufficient documentation

## 2016-06-12 DIAGNOSIS — M7072 Other bursitis of hip, left hip: Secondary | ICD-10-CM | POA: Diagnosis not present

## 2016-06-12 MED ORDER — ONDANSETRON 4 MG PO TBDP: ORAL_TABLET | ORAL | 0 refills | Status: DC

## 2016-06-12 MED ORDER — CYANOCOBALAMIN 1500 MCG PO TBDP: 1.0000 | ORAL_TABLET | Freq: Every day | ORAL | 2 refills | Status: DC

## 2016-06-12 MED ORDER — OXYCODONE HCL 5 MG PO TABS: 2.5000 mg | ORAL_TABLET | ORAL | 0 refills | Status: DC | PRN

## 2016-06-12 NOTE — Patient Instructions (Addendum)
____________________________________________________________________________________________  Preparing for Procedure with Sedation Instructions: . Oral Intake: Do not eat or drink anything for at least 8 hours prior to your procedure. . Transportation: Public transportation is not allowed. Bring an adult driver. The driver must be physically present in our waiting room before any procedure can be started. [place] Physical Assistance: Bring an adult physically capable of assisting you, in the event you need help. This adult should keep you company at home for at least 6 hours after the procedure. . Blood Pressure Medicine: Take your blood pressure medicine with a sip of water the morning of the procedure. . Blood thinners:  . Diabetics on insulin: Notify the staff so that you can be scheduled 1st case in the morning. If your diabetes requires high dose insulin, take only  of your normal insulin dose the morning of the procedure and notify the staff that you have done so. . Preventing infections: Shower with an antibacterial soap the morning of your procedure. . Build-up your immune system: Take 1000 mg of Vitamin C with every meal (3 times a day) the day prior to your procedure. Marland Kitchen Antibiotics: Inform the staff if you have a condition or reason that requires you to take antibiotics before dental procedures. . Pregnancy: If you are pregnant, call and cancel the procedure. . Sickness: If you have a cold, fever, or any active infections, call and cancel the procedure. . Arrival: You must be in the facility at least 30 minutes prior to your scheduled procedure. . Children: Do not bring children with you. . Dress appropriately: Bring dark clothing that you would not mind if they get stained. . Valuables: Do not bring any jewelry or valuables. Procedure appointments are reserved for interventional treatments only. Marland Kitchen No Prescription Refills. . No medication changes will be discussed during procedure  appointments. . No disability issues will be discussed. ______________________________________________________________________________________________  Radiofrequency Lesioning Radiofrequency lesioning is a procedure that is performed to relieve pain. The procedure is often used for back, neck, or arm pain. Radiofrequency lesioning involves the use of a machine that creates radio waves to make heat. During the procedure, the heat is applied to the nerve that carries the pain signal. The heat damages the nerve and interferes with the pain signal. Pain relief usually starts about 2 weeks after the procedure and lasts for 6 months to 1 year. Tell a health care provider about: Any allergies you have. All medicines you are taking, including vitamins, herbs, eye drops, creams, and over-the-counter medicines. Any problems you or family members have had with anesthetic medicines. Any blood disorders you have. Any surgeries you have had. Any medical conditions you have. Whether you are pregnant or may be pregnant. What are the risks? Generally, this is a safe procedure. However, problems may occur, including: Pain or soreness at the injection site. Infection at the injection site. Damage to nerves or blood vessels. What happens before the procedure? Ask your health care provider about: Changing or stopping your regular medicines. This is especially important if you are taking diabetes medicines or blood thinners. Taking medicines such as aspirin and ibuprofen. These medicines can thin your blood. Do not take these medicines before your procedure if your health care provider instructs you not to. Follow instructions from your health care provider about eating or drinking restrictions. Plan to have someone take you home after the procedure. If you go home right after the procedure, plan to have someone with you for 24 hours. What happens during the  procedure? You will be given one or more of the  following: A medicine to help you relax (sedative). A medicine to numb the area (local anesthetic). You will be awake during the procedure. You will need to be able to talk with the health care provider during the procedure. With the help of a type of X-ray (fluoroscopy), the health care provider will insert a radiofrequency needle into the area to be treated. Next, a wire that carries the radio waves (electrode) will be put through the radiofrequency needle. An electrical pulse will be sent through the electrode to verify the correct nerve. You will feel a tingling sensation, and you may have muscle twitching. Then, the tissue that is around the needle tip will be heated by an electric current that is passed using the radiofrequency machine. This will numb the nerves. A bandage (dressing) will be put on the insertion area after the procedure is done. The procedure may vary among health care providers and hospitals. What happens after the procedure? Your blood pressure, heart rate, breathing rate, and blood oxygen level will be monitored often until the medicines you were given have worn off. Return to your normal activities as directed by your health care provider. This information is not intended to replace advice given to you by your health care provider. Make sure you discuss any questions you have with your health care provider. Document Released: 09/04/2010 Document Revised: 06/14/2015 Document Reviewed: 02/13/2014 Elsevier Interactive Patient Education  2017 [employer].

## 2016-06-12 NOTE — Progress Notes (Signed)
Nursing Pain Medication Assessment:  Safety precautions to be maintained throughout the outpatient stay will include: orient to surroundings, keep bed in low position, maintain call bell within reach at all times, provide assistance with transfer out of bed and ambulation.  Medication Inspection Compliance: Ms. Oliveira did not comply with our request to bring her pills to be counted. She was reminded that bringing the medication bottles, even when empty, is a requirement.  Medication: None brought in. Pill/Patch Count: None available to be counted. Bottle Appearance: No container available. Did not bring bottle(s) to appointment. Filled Date: N/A Last Medication intake:  Yesterday   Pateint picked up wrong bottle to bring today

## 2016-06-12 NOTE — Progress Notes (Signed)
Patient's Name: Stephanie Short  MRN: [id number]  Referring Provider: Kirk Ruths, MD  DOB: 05-02-1950  PCP: Kirk Ruths, MD  DOS: 06/12/2016  Note by: Vevelyn Francois NP  Service setting: Ambulatory outpatient  Specialty: Interventional Pain Management  Location: [facility] (AMB) Pain Management Facility    Patient type: Established    Primary Reason(s) for Visit: Encounter for prescription drug management (Level of risk: moderate) CC: Back Pain (lower) and Neck Pain (mid radiates to right shoulder)  HPI  [name] is a 66 y.o. year old, female patient, who comes today for a medication management evaluation. She has HYPERLIPIDEMIA-MIXED; AORTIC ATHEROSCLEROSIS; Endometrial polyp; Chronic low back pain (Location of Primary Source of Pain) (Bilateral) (R>L); Lumbar spondylosis; Failed back surgical syndrome; Lumbar facet syndrome (Bilateral) (R>L); Long term current use of opiate analgesic; Long term prescription opiate use; Opiate use (22.5 MME/Day); Encounter for therapeutic drug level monitoring; Encounter for chronic pain management; Airway hyperreactivity; Atherosclerosis of abdominal aorta ([place]); Diabetes mellitus ([place]); Benign essential HTN; Cannot sleep; Major depression in remission ([place]); Burning or prickling sensation; Hemorrhage, postmenopausal; Pure hypercholesterolemia; Osteopenia; Chronic hip pain(2) (Bilateral) (L>R); Greater trochanteric bursitis of hips (Bilateral); Chronic sacroiliac joint pain (Bilateral); Osteoarthritis of hips (Bilateral); Pain management; Chronic pain of lower extremity  (Bilateral) (L>R); Postmenopausal osteoporosis; Chronic cervical radicular pain; Neurogenic pain; Chronic kidney disease; Vitamin B12 deficiency; Problem with medical care compliance; Poor historian; Asthma; Chronic neck pain (3) (B) (R>L); Monoclonal gammopathy; and Chronic pain syndrome on her problem list. Her primarily concern today is the Back Pain (lower) and Neck Pain (mid  radiates to right shoulder)  Pain Assessment: Self-Reported Pain Score: 3 /10             Reported level is compatible with observation.       Pain Type: Neuropathic pain Pain Location: Back Pain Orientation: Right, Left, Lower Pain Descriptors / Indicators: Aching, Constant Pain Frequency: Constant  [name] was last scheduled for an appointment on 03/27/16 for medication management. During today's appointment we reviewed [name]'s chronic pain status, as well as her outpatient medication regimen. She has chronic low back pain. She amdits that she also has bursitis in her hips. She denies any radicular symptoms. She denies any numbness or tingling. She does have some weakness but she states this was related to virus. She has constipation but uses OTC Senna Plus which is effective..   The patient  reports that she does not use drugs. Her body mass index is 32.42 kg/m.  Further details on both, my assessment(s), as well as the proposed treatment plan, please see below.  Controlled Substance Pharmacotherapy Assessment REMS (Risk Evaluation and Mitigation Strategy)  Analgesic:Oxycodone IR 2.34m, 1tablet every 4 hours PRN Maximum: 3/day. (7.552mday of oxycodone) MME/day:11.25 Mg/day  GaIgnatius SpeckingRN  06/12/2016  9:10 AM  Sign at close encounter Nursing Pain Medication Assessment:  Safety precautions to be maintained throughout the outpatient stay will include: orient to surroundings, keep bed in low position, maintain call bell within reach at all times, provide assistance with transfer out of bed and ambulation.  Medication Inspection Compliance: Ms. RoBannisterid not comply with our request to bring her pills to be counted. She was reminded that bringing the medication bottles, even when empty, is a requirement.  Medication: None brought in. Pill/Patch Count: None available to be counted. Bottle Appearance: No container available. Did not bring bottle(s) to  appointment. Filled Date: N/A Last Medication intake:  Yesterday   Pateint  picked up wrong bottle to bring today   Pharmacokinetics: Liberation and absorption (onset of action): WNL Distribution (time to peak effect): WNL Metabolism and excretion (duration of action): WNL         Pharmacodynamics: Desired effects: Analgesia: [name] reports >50% benefit. Functional ability: Patient reports that medication allows her to accomplish basic ADLs Clinically meaningful improvement in function (CMIF): Sustained CMIF goals met Perceived effectiveness: Described as relatively effective, allowing for increase in activities of daily living (ADL) Undesirable effects: Side-effects or Adverse reactions: None reported Monitoring: [place] PMP: Online review of the past 54-monthperiod conducted. Compliant with practice rules and regulations List of all UDS test(s) done:  Lab Results  Component Value Date   TOXASSSELUR FINAL 10/17/2015   TOXASSSELUR FINAL 05/23/2015   TOXASSSELUR FINAL 04/24/2015   T[place]FINAL 01/31/2015   Last UDS on record: ToxAssure Select 13  Date Value Ref Range Status  10/17/2015 FINAL  Final    Comment:    ==================================================================== TOXASSURE SELECT 13 (MW) ==================================================================== Test                             Result       Flag       Units Drug Absent but Declared for Prescription Verification   Alprazolam                     Not Detected UNEXPECTED ng/mg creat   Oxycodone                      Not Detected UNEXPECTED ng/mg creat ==================================================================== Test                      Result    Flag   Units      Ref Range   Creatinine              133              mg/dL      >=20 ==================================================================== Declared Medications:  The flagging and interpretation on this report are based on the   following declared medications.  Unexpected results may arise from  inaccuracies in the declared medications.  **Note: The testing scope of this panel includes these medications:  Alprazolam (Xanax)  Oxycodone  **Note: The testing scope of this panel does not include following  reported medications:  Acetaminophen (Tylenol)  Albuterol (Proventil)  Aspirin (Aspirin 81)  Cyanocobalamin  Felodipine  Metformin  Ondansetron  Pantoprazole  Ranitidine  Spironolactone  Vitamin C  Zolpidem ==================================================================== For clinical consultation, please call [phone] ====================================================================    UDS interpretation: Compliant          Medication Assessment Form: Reviewed. Patient indicates being compliant with therapy Treatment compliance: Compliant Risk Assessment Profile: Aberrant behavior: See prior evaluations. None observed or detected today Comorbid factors increasing risk of overdose: See prior notes. No additional risks detected today Risk of substance use disorder (SUD): Low Opioid Risk Tool (ORT) Total Score: 1  Interpretation Table:  Score <3 = Low Risk for SUD  Score between 4-7 = Moderate Risk for SUD  Score >8 = High Risk for Opioid Abuse   Risk Mitigation Strategies:  Patient Counseling: Covered Patient-Prescriber Agreement (PPA): Present and active  Notification to other healthcare providers: Done  Pharmacologic Plan: No change in therapy, at this time  Laboratory Chemistry  Inflammation Markers Lab Results  Component Value  Date   CRP 0.6 04/25/2015   ESRSEDRATE 7 04/25/2015   (CRP: Acute Phase) (ESR: Chronic Phase) Renal Function Markers Lab Results  Component Value Date   BUN 17 06/03/2016   CREATININE 0.72 06/03/2016   GFRAA >60 06/03/2016   GFRNONAA >60 06/03/2016   Hepatic Function Markers Lab Results  Component Value Date   AST 25 12/21/2015   ALT 15  12/21/2015   ALBUMIN 4.3 12/21/2015   ALKPHOS 84 12/21/2015   Electrolytes Lab Results  Component Value Date   NA 136 06/03/2016   K 4.1 06/03/2016   CL 105 06/03/2016   CALCIUM 9.4 06/03/2016   MG 1.9 04/25/2015   Neuropathy Markers Lab Results  Component Value Date   VITAMINB12 178 (L) 04/25/2015   Bone Pathology Markers Lab Results  Component Value Date   ALKPHOS 84 12/21/2015   VD125OH2TOT 55 04/25/2015   [id number] 55 04/25/2015   VD2125OH2 <10 04/25/2015   CALCIUM 9.4 06/03/2016   Coagulation Parameters Lab Results  Component Value Date   INR 1.0 09/16/2012   LABPROT 13.3 09/16/2012   PLT 234 06/03/2016   Cardiovascular Markers Lab Results  Component Value Date   HGB 13.2 06/03/2016   HCT 39.9 06/03/2016   Note: Lab results reviewed.  Recent Diagnostic Imaging Review  Dg Chest 2 View  Result Date: 06/03/2016 CLINICAL DATA:  Central chest pain EXAM: CHEST  2 VIEW COMPARISON:  12/28/2015 FINDINGS: The heart size and mediastinal contours are within normal limits. Both lungs are clear. The visualized skeletal structures are unremarkable. IMPRESSION: No active cardiopulmonary disease. Electronically Signed   By: Inez Catalina M.D.   On: 06/03/2016 13:14   Note: Imaging results reviewed.          Meds  The patient has a current medication list which includes the following prescription(s): acetaminophen, albuterol, alprazolam, ascorbic acid, aspirin ec, fifty50 glucose meter 2.0, cyanocobalamin, felodipine, glimepiride, ondansetron, one touch ultra test, pantoprazole, ranitidine, zolpidem, oxycodone, and oxycodone.  Current Outpatient Prescriptions on File Prior to Visit  Medication Sig  . acetaminophen (TYLENOL) 500 MG tablet Take 1,000-1,500 mg by mouth every 6 (six) hours as needed for mild pain.  Marland Kitchen albuterol (PROVENTIL HFA;VENTOLIN HFA) 108 (90 Base) MCG/ACT inhaler Inhale 2 puffs into the lungs every 6 (six) hours as needed for wheezing or shortness of breath.   . ALPRAZolam (XANAX) 0.25 MG tablet Take 0.125-0.25 mg by mouth 2 (two) times daily as needed for anxiety or sleep.   . Ascorbic Acid (VITAMIN C PO) Take 1 tablet by mouth at bedtime.  Marland Kitchen aspirin EC 81 MG tablet Take 81 mg by mouth at bedtime.  . Blood Glucose Monitoring Suppl (FIFTY50 GLUCOSE METER 2.0) w/Device KIT Use as directed. Dx E11.22 ONE TOUCH  . felodipine (PLENDIL) 10 MG 24 hr tablet Take 10 mg by mouth at bedtime.  . ONE TOUCH ULTRA TEST test strip USE TO CHECK BLOOD SUGAR 1-2 TIMES DAILY AS DIRECTED. DX E11.22.  . pantoprazole (PROTONIX) 40 MG tablet Take by mouth.  . ranitidine (ZANTAC) 150 MG tablet TAKE 1 TABLET (150 MG TOTAL) BY MOUTH 2 (TWO) TIMES DAILY. AS NEEDED  . zolpidem (AMBIEN) 5 MG tablet TAKE 1/2-1 TABLET BY MOUTH AT BEDTIME AS NEEDED FOR SLEEP   No current facility-administered medications on file prior to visit.    ROS  Constitutional: Denies any fever or chills Gastrointestinal: No reported hemesis, hematochezia, vomiting, or acute GI distress Musculoskeletal: Denies any acute onset joint swelling, redness, loss of ROM,  or weakness Neurological: No reported episodes of acute onset apraxia, aphasia, dysarthria, agnosia, amnesia, paralysis, loss of coordination, or loss of consciousness  Allergies  Ms. Sibley is allergic to codeine; dilaudid [hydromorphone hcl]; percocet [oxycodone-acetaminophen]; tramadol; gabapentin; hydrocodone-acetaminophen; hydromorphone; oxycodone-acetaminophen; promethazine; and scallops [shellfish allergy].  PFSH  Drug: Ms. Slaby  reports that she does not use drugs. Alcohol:  reports that she drinks alcohol. Tobacco:  reports that she quit smoking about 12 years ago. She has a 20.00 pack-year smoking history. She has never used smokeless tobacco. Medical:  has a past medical history of Asthma; Depression; Diabetes mellitus; Hepatitis C; Hypertension; and Insomnia. Family: family history includes Colon cancer in her other; Coronary  artery disease (age of onset: 82) in her brother; Coronary artery disease (age of onset: 76) in her mother; Diabetes in her father and other; Heart attack in her brother; Heart disease in her father; Hypertension in her other.  Past Surgical History:  Procedure Laterality Date  . BACK SURGERY  2013   [facility] Dr. Mauri Pole  . COLONOSCOPY W/ POLYPECTOMY    . COLONOSCOPY WITH PROPOFOL N/A 03/13/2015   Procedure: COLONOSCOPY WITH PROPOFOL;  Surgeon: Lollie Sails, MD;  Location: [facility] ENDOSCOPY;  Service: Endoscopy;  Laterality: N/A;  . HYSTEROSCOPY W/D&C N/A 08/18/2014   Procedure: DILATATION AND CURETTAGE /HYSTEROSCOPY;  Surgeon: Lorette Ang, MD;  Location: [facility] ORS;  Service: Gynecology;  Laterality: N/A;  . KNEE ARTHROSCOPY Right    Constitutional Exam  General appearance: Well nourished, well developed, and well hydrated. In no apparent acute distress Vitals:   06/12/16 0859  BP: 139/74  Pulse: (!) 102  Resp: 16  Temp: 97.8 F (36.6 C)  SpO2: 96%  Weight: 183 lb (83 kg)  Height: _0  (1.6 m)   BMI Assessment: Estimated body mass index is 32.42 kg/m as calculated from the following:   Height as of this encounter: _1  (1.6 m).   Weight as of this encounter: 183 lb (83 kg).  BMI interpretation table: BMI level Category Range association with higher incidence of chronic pain  <18 kg/m2 Underweight   18.5-24.9 kg/m2 Ideal body weight   25-29.9 kg/m2 Overweight Increased incidence by 20%  30-34.9 kg/m2 Obese (Class I) Increased incidence by 68%  35-39.9 kg/m2 Severe obesity (Class II) Increased incidence by 136%  >40 kg/m2 Extreme obesity (Class III) Increased incidence by 254%   BMI Readings from Last 4 Encounters:  06/12/16 32.42 kg/m  06/03/16 31.89 kg/m  03/27/16 33.65 kg/m  01/29/16 30.93 kg/m   Wt Readings from Last 4 Encounters:  06/12/16 183 lb (83 kg)  06/03/16 180 lb (81.6 kg)  03/27/16 184 lb (83.5 kg)  01/29/16 183 lb (83 kg)  Psych/Mental status:  Alert, oriented x 3 (person, place, & time)       Eyes: PERLA Respiratory: No evidence of acute respiratory distress   Lumbar Spine Exam  Inspection: Well healed scar from previous spine surgery detected Alignment: Symmetrical Functional ROM: Unrestricted ROM      Stability: No instability detected Muscle strength & Tone: Functionally intact Sensory: Unimpaired Palpation: Complains of area being tender to palpation       Provocative Tests: Lumbar Hyperextension and rotation test: Positive bilaterally for facet joint pain, greater on right Patrick's Maneuver: evaluation deferred today                    Gait & Posture Assessment  Ambulation: Unassisted Gait: Relatively normal for age and body habitus Posture: WNL  Lower Extremity Exam    Side: Right lower extremity  Side: Left lower extremity  Inspection: No masses, redness, swelling, or asymmetry. No contractures  Inspection: No masses, redness, swelling, or asymmetry. No contractures  Functional ROM: Unrestricted ROM          Functional ROM: Unrestricted ROM          Muscle strength & Tone: Functionally intact  Muscle strength & Tone: Functionally intact  Sensory: Unimpaired  Sensory: Unimpaired  Palpation: No palpable anomalies  Palpation: No palpable anomalies   Assessment  Primary Diagnosis & Pertinent Problem List: The primary encounter diagnosis was Chronic low back pain (Location of Primary Source of Pain) (Bilateral) (R>L). Diagnoses of Lumbar facet syndrome (Bilateral) (R>L), Lumbar spondylosis, Chronic pain syndrome, Long term current use of opiate analgesic, and Vitamin B12 deficiency were also pertinent to this visit.  Status Diagnosis  Controlled Controlled Controlled 1. Chronic low back pain (Location of Primary Source of Pain) (Bilateral) (R>L)   2. Lumbar facet syndrome (Bilateral) (R>L)   3. Lumbar spondylosis   4. Chronic pain syndrome   5. Long term current use of opiate analgesic   6. Vitamin B12  deficiency     Problems updated and reviewed during this visit: Problem  Chronic Pain Syndrome  Chronic neck pain (3) (B) (R>L)  Chronic hip pain(2) (Bilateral) (L>R)  Greater trochanteric bursitis of hips (Bilateral)  Chronic sacroiliac joint pain (Bilateral)  Osteoarthritis of hips (Bilateral)  Chronic pain of lower extremity  (Bilateral) (L>R)  Chronic cervical radicular pain  Neurogenic Pain  Chronic low back pain (Location of Primary Source of Pain) (Bilateral) (R>L)  Lumbar Spondylosis  Failed Back Surgical Syndrome  Lumbar facet syndrome (Bilateral) (R>L)  Chronic Kidney Disease  Long Term Current Use of Opiate Analgesic  Long Term Prescription Opiate Use  Opiate use (22.5 MME/Day)   Oxycodone IR 5 mg, half of a tablet every 4 hours with a maximum of 3 tablets per day (15 mg/day of oxycodone)   Encounter for Therapeutic Drug Level Monitoring  Encounter for Chronic Pain Management  Osteopenia  Monoclonal Gammopathy  Problem With Medical Care Compliance  Poor Historian  Vitamin B12 Deficiency  Pain Management   Back surgery done by Dr. Mauri Pole. Prior pain specialists include: Dr. Mohammed [facility] Pain Physician), Dr. Sharlet Salina [facility]), Dr. Angie Fava ([facility]), in addition to another physician [facility] pain clinic which she cannot recall. Currently being seen and evaluated by Dr. Dossie Arbour [facility] Pain Specialist)   Postmenopausal Osteoporosis  Endometrial Polyp  Hemorrhage, Postmenopausal  Pure Hypercholesterolemia   Last Assessment & Plan:  Appropriate diet is being attempted and myalgia's are not noted.     Major Depression in Remission ([place]cc)   Last Assessment & Plan:  Mood remains good with usual variations.  Last Assessment & Plan:  Mood has been doing relatively well.  Last Assessment & Plan:  Mood waxes and wanes but is doing fairly well at this point.    Atherosclerosis of Abdominal Aorta ([place])   Overview:  On 8-11 lumbar xray  Last  Assessment & Plan:  No abd pain or back pain noted.  Overview:  On 8-11 lumbar xray  Last Assessment & Plan:  No abd pain or back pain and chol is treated.   Overview:  On 8-11 lumbar xray  Last Assessment & Plan:  No abd pain or back pain on meds.    Burning Or Prickling Sensation  Airway Hyperreactivity   Last Assessment &  Plan:  Recent cough and congestion noted.  Last Assessment & Plan:  The patient has been breathing at baseline without any recent flairs being noted.     Diabetes Mellitus ([facility])   Overview:  Intermittent 2 vs 3 per gfr with albuminuria  Last Assessment & Plan:  Continuing on diet as well as prescribed regimen without severe hypoglycemia being noted.  Nausea and itching are not symptomatic and nsaids are being avoided.   Overview:  Intermittent 2 vs 3 per gfr with albuminuria  Last Assessment & Plan:  Continuing on diet as well as prescribed regimen with severe hypoglycemia. Intermittent 2 vs 3 per gfr with albuminuria   Benign Essential Htn   Last Assessment & Plan:  Is compliant with hypertensive medications without clear side effects or lack of control.   Last Assessment & Plan:  Patients blood pressure has seemingly been controlled without significant side effects such as dizziness or slow heart rate.     Cannot Sleep  Asthma   Last Assessment & Plan:  Recent cough and congestion noted.    HYPERLIPIDEMIA-MIXED   Qualifier: Diagnosis of  By: Rockey Situ MD, Tim     AORTIC ATHEROSCLEROSIS   Qualifier: Diagnosis of  By: Rockey Situ MD, [place] of Care  Pharmacotherapy (Medications Ordered): Meds ordered this encounter  Medications  . oxyCODONE (OXY IR/ROXICODONE) 5 MG immediate release tablet    Sig: Take 0.5 tablets (2.5 mg total) by mouth every 4 (four) hours as needed for severe pain.    Dispense:  90 tablet    Refill:  0    Do not place this medication, or any other prescription from our practice, on "Automatic Refill". Patient may  have prescription filled one day early if pharmacy is closed on scheduled refill date. Do not fill until: 06/12/16 To last until: 07/12/16    Order Specific Question:   Supervising Provider    Answer:   Milinda Pointer [phone]  . oxyCODONE (OXY IR/ROXICODONE) 5 MG immediate release tablet    Sig: Take 0.5 tablets (2.5 mg total) by mouth every 4 (four) hours as needed for severe pain.    Dispense:  90 tablet    Refill:  0    Do not place this medication, or any other prescription from our practice, on "Automatic Refill". Patient may have prescription filled one day early if pharmacy is closed on scheduled refill date. Do not fill until: 07/12/16 To last until: 08/11/16    Order Specific Question:   Supervising Provider    Answer:   Milinda Pointer [phone]  . Cyanocobalamin (VITAMELTS ENERGY VITAMIN B-12) 1500 MCG TBDP    Sig: Take 1 tablet by mouth daily.    Dispense:  30 tablet    Refill:  2    Do not place this medication, or any other prescription from our practice, on "Automatic Refill".    Order Specific Question:   Supervising Provider    Answer:   Milinda Pointer [phone]  . ondansetron (ZOFRAN ODT) 4 MG disintegrating tablet    Sig: Allow 1-2 tablets to dissolve in your mouth every 8 hours as needed for nausea/vomiting    Dispense:  30 tablet    Refill:  0    Order Specific Question:   Supervising Provider    Answer:   Milinda Pointer [phone]   New Prescriptions   No medications on file   Medications administered today: Ms. Stallings had no medications administered during this visit. Lab-work, procedure(s),  and/or referral(s): Orders Placed This Encounter  Procedures  . Radiofrequency,Lumbar  . ToxASSURE Select 13 (MW), Urine   Imaging and/or referral(s): None  Interventional therapies: Planned, scheduled, and/or pending:   Continue with current regimen.  Discussed RFA and benefits Right Lumbar RFA ablation    Considering:   Repeat diagnostic lumbar  facet block Possible bilateral lumbar facet radiofrequency ablation Possible diagnostic bilateral sacroiliac joint block Possible diagnostic bilateral intra-articular hip joint injection    Palliative PRN treatment(s):   For the low back pain, bilateral diagnostic lumbar facet block + diagnostic bilateral sacroiliac joint injection  For the hip pain, diagnostic intra-articular hip joint injection    Provider-requested follow-up: Return in about 2 months (around 08/12/2016) for procedure (w/ sedation):, RFA, (ASAA), by MD, Medication Mgmt.  Future Appointments Date Time Provider [place]  06/27/2016 2:00 PM [facility] None  06/27/2016 2:30 PM Lequita Asal, MD [facility] None  08/12/2016 1:15 PM Vevelyn Francois, NP [facility] None  08/20/2016 10:15 AM Milinda Pointer, MD [facility] None   Primary Care Physician: Kirk Ruths, MD Location: [facility] Note by: Vevelyn Francois NP Date: 06/12/2016; Time: 11:09 AM  Pain Score Disclaimer: We use the NRS-11 scale. This is a self-reported, subjective measurement of pain severity with only modest accuracy. It is used primarily to identify changes within a particular patient. It must be understood that outpatient pain scales are significantly less accurate that those used for research, where they can be applied under ideal controlled circumstances with minimal exposure to variables. In reality, the score is likely to be a combination of pain intensity and pain affect, where pain affect describes the degree of emotional arousal or changes in action readiness caused by the sensory experience of pain. Factors such as social and work situation, setting, emotional state, anxiety levels, expectation, and prior pain experience may influence pain perception and show large inter-individual differences that may also be affected by time variables.  Patient instructions provided during this  appointment: Patient Instructions  ____________________________________________________________________________________________  Preparing for Procedure with Sedation Instructions: . Oral Intake: Do not eat or drink anything for at least 8 hours prior to your procedure. . Transportation: Public transportation is not allowed. Bring an adult driver. The driver must be physically present in our waiting room before any procedure can be started. Marland Kitchen Physical Assistance: Bring an adult physically capable of assisting you, in the event you need help. This adult should keep you company at home for at least 6 hours after the procedure. . Blood Pressure Medicine: Take your blood pressure medicine with a sip of water the morning of the procedure. . Blood thinners:  . Diabetics on insulin: Notify the staff so that you can be scheduled 1st case in the morning. If your diabetes requires high dose insulin, take only  of your normal insulin dose the morning of the procedure and notify the staff that you have done so. . Preventing infections: Shower with an antibacterial soap the morning of your procedure. . Build-up your immune system: Take 1000 mg of Vitamin C with every meal (3 times a day) the day prior to your procedure. Marland Kitchen Antibiotics: Inform the staff if you have a condition or reason that requires you to take antibiotics before dental procedures. . Pregnancy: If you are pregnant, call and cancel the procedure. . Sickness: If you have a cold, fever, or any active infections, call and cancel the procedure. . Arrival: You must be in the facility  at least 30 minutes prior to your scheduled procedure. . Children: Do not bring children with you. . Dress appropriately: Bring dark clothing that you would not mind if they get stained. . Valuables: Do not bring any jewelry or valuables. Procedure appointments are reserved for interventional treatments only. Marland Kitchen No Prescription Refills. . No medication changes will  be discussed during procedure appointments. . No disability issues will be discussed. ______________________________________________________________________________________________  Radiofrequency Lesioning Radiofrequency lesioning is a procedure that is performed to relieve pain. The procedure is often used for back, neck, or arm pain. Radiofrequency lesioning involves the use of a machine that creates radio waves to make heat. During the procedure, the heat is applied to the nerve that carries the pain signal. The heat damages the nerve and interferes with the pain signal. Pain relief usually starts about 2 weeks after the procedure and lasts for 6 months to 1 year. Tell a health care provider about: Any allergies you have. All medicines you are taking, including vitamins, herbs, eye drops, creams, and over-the-counter medicines. Any problems you or family members have had with anesthetic medicines. Any blood disorders you have. Any surgeries you have had. Any medical conditions you have. Whether you are pregnant or may be pregnant. What are the risks? Generally, this is a safe procedure. However, problems may occur, including: Pain or soreness at the injection site. Infection at the injection site. Damage to nerves or blood vessels. What happens before the procedure? Ask your health care provider about: Changing or stopping your regular medicines. This is especially important if you are taking diabetes medicines or blood thinners. Taking medicines such as aspirin and ibuprofen. These medicines can thin your blood. Do not take these medicines before your procedure if your health care provider instructs you not to. Follow instructions from your health care provider about eating or drinking restrictions. Plan to have someone take you home after the procedure. If you go home right after the procedure, plan to have someone with you for 24 hours. What happens during the procedure? You will be  given one or more of the following: A medicine to help you relax (sedative). A medicine to numb the area (local anesthetic). You will be awake during the procedure. You will need to be able to talk with the health care provider during the procedure. With the help of a type of X-ray (fluoroscopy), the health care provider will insert a radiofrequency needle into the area to be treated. Next, a wire that carries the radio waves (electrode) will be put through the radiofrequency needle. An electrical pulse will be sent through the electrode to verify the correct nerve. You will feel a tingling sensation, and you may have muscle twitching. Then, the tissue that is around the needle tip will be heated by an electric current that is passed using the radiofrequency machine. This will numb the nerves. A bandage (dressing) will be put on the insertion area after the procedure is done. The procedure may vary among health care providers and hospitals. What happens after the procedure? Your blood pressure, heart rate, breathing rate, and blood oxygen level will be monitored often until the medicines you were given have worn off. Return to your normal activities as directed by your health care provider. This information is not intended to replace advice given to you by your health care provider. Make sure you discuss any questions you have with your health care provider. Document Released: 09/04/2010 Document Revised: 06/14/2015 Document Reviewed: 02/13/2014 Elsevier Interactive  Patient Education  2017 [employer].

## 2016-06-17 ENCOUNTER — Telehealth: Payer: Self-pay

## 2016-06-17 NOTE — Telephone Encounter (Signed)
Patient was here last week for med refill patient grandson accidentally through patient prescriptions in the trash please call patient [phone]

## 2016-06-17 NOTE — Telephone Encounter (Signed)
Spoke with patient and told her that we can not replace lost or stolen Rx's.  While talking on phone with patient she found Rx.  Problem solved.

## 2016-06-18 LAB — TOXASSURE SELECT 13 (MW), URINE

## 2016-06-24 ENCOUNTER — Other Ambulatory Visit: Payer: Self-pay | Admitting: Pain Medicine

## 2016-06-24 DIAGNOSIS — E538 Deficiency of other specified B group vitamins: Secondary | ICD-10-CM

## 2016-06-26 ENCOUNTER — Telehealth: Payer: Self-pay | Admitting: Pain Medicine

## 2016-06-26 NOTE — Telephone Encounter (Signed)
Pharmacy calling for refill on medication.  Informed pharmacist that we did not do any phone refills.  Pharmacist states that she will notify the patient.

## 2016-06-26 NOTE — Telephone Encounter (Signed)
[facility] calling with question about a refill, please call them [phone]

## 2016-06-27 ENCOUNTER — Inpatient Hospital Stay: Payer: 59 | Admitting: Hematology and Oncology

## 2016-06-27 ENCOUNTER — Inpatient Hospital Stay: Payer: 59

## 2016-07-10 ENCOUNTER — Inpatient Hospital Stay: Payer: 59

## 2016-07-10 ENCOUNTER — Inpatient Hospital Stay: Payer: 59 | Admitting: Hematology and Oncology

## 2016-07-10 NOTE — Progress Notes (Deleted)
[facility] day:  07/10/2016  Chief Complaint: Stephanie Short is a 66 y.o. 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  HPI:  The patient was last seen in the medical oncology clinic on 12/28/2015.  At that time, work-up was reviewed.  She appeared to have an MGUS.  Bone survey and 24 hour UPEP were pending.  Bone survey on 12/28/2015 revealed no radiographically worrisome osteolytic lesions identified in the axial or appendicular skeleton. 24 hour UPEP was normal.  During the interim,   Past Medical History:  Diagnosis Date  . Asthma   . Depression   . Diabetes mellitus   . Hepatitis C    Dr. 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 Hypertension   . Insomnia     Past Surgical History:  Procedure Laterality Date  . BACK SURGERY  2013   [facility] Dr. Mauri Pole  . COLONOSCOPY W/ POLYPECTOMY    . COLONOSCOPY WITH PROPOFOL N/A 03/13/2015   Procedure: COLONOSCOPY WITH PROPOFOL;  Surgeon: Lollie Sails, MD;  Location: [facility] ENDOSCOPY;  Service: Endoscopy;  Laterality: N/A;  . HYSTEROSCOPY W/D&C N/A 08/18/2014   Procedure: DILATATION AND CURETTAGE /HYSTEROSCOPY;  Surgeon: Lorette Ang, MD;  Location: [facility] ORS;  Service: Gynecology;  Laterality: N/A;  . KNEE ARTHROSCOPY Right     Family History  Problem Relation Age of Onset  . Coronary artery disease Mother 74  . Coronary artery disease Brother 35  . Heart attack Brother        MI  . Heart disease Father   . Diabetes Father   . Diabetes Other   . Hypertension Other   . Colon cancer Other   She has a nephew who died of colon cancer at age 66.   Social History:  reports that she quit smoking about 12 years ago. She has a 20.00 pack-year smoking history. She has never used smokeless tobacco. She reports that she drinks alcohol. She reports that she does not use drugs.  The patient is alone today.  Allergies:   Allergies  Allergen Reactions  . Codeine Nausea And Vomiting  . Dilaudid [Hydromorphone Hcl] Nausea And Vomiting  . Percocet [Oxycodone-Acetaminophen] Nausea And Vomiting and Nausea Only  . Tramadol Nausea And Vomiting and Nausea Only  . Gabapentin Nausea And Vomiting and Other (See Comments)    Reaction:  Dizziness and GI upset  . Hydrocodone-Acetaminophen Nausea And Vomiting and Nausea Only  . Hydromorphone Nausea Only  . Oxycodone-Acetaminophen Nausea Only  . Promethazine Other (See Comments)    Other reaction(s): Other (See Comments) Reaction:  GI upset  Reaction:  GI upset   . Scallops [Shellfish Allergy] Nausea And Vomiting    Other reaction(s): Nausea And Vomiting    Current Medications: Current Outpatient Prescriptions  Medication Sig Dispense Refill  . acetaminophen (TYLENOL) 500 MG tablet Take 1,000-1,500 mg by mouth every 6 (six) hours as needed for mild pain.    Marland Kitchen albuterol (PROVENTIL HFA;VENTOLIN HFA) 108 (90 Base) MCG/ACT inhaler Inhale 2 puffs into the lungs every 6 (six) hours as needed for wheezing or shortness of breath.    . ALPRAZolam (XANAX) 0.25 MG tablet Take 0.125-0.25 mg by mouth 2 (two) times daily as needed for anxiety or sleep.     . Ascorbic Acid (VITAMIN C PO) Take 1 tablet by mouth at bedtime.    Marland Kitchen aspirin EC 81 MG  tablet Take 81 mg by mouth at bedtime.    . Blood Glucose Monitoring Suppl (FIFTY50 GLUCOSE METER 2.0) w/Device KIT Use as directed. Dx E11.22 ONE TOUCH    . Cyanocobalamin (VITAMELTS ENERGY VITAMIN B-12) 1500 MCG TBDP Take 1 tablet by mouth daily. 30 tablet 2  . felodipine (PLENDIL) 10 MG 24 hr tablet Take 10 mg by mouth at bedtime.    Marland Kitchen glimepiride (AMARYL) 2 MG tablet Take 2 mg by mouth daily with breakfast.    . ondansetron (ZOFRAN ODT) 4 MG disintegrating tablet Allow 1-2 tablets to dissolve in your mouth every 8 hours as needed for nausea/vomiting 30 tablet 0  . ONE TOUCH ULTRA TEST test strip USE TO CHECK BLOOD SUGAR 1-2 TIMES DAILY  AS DIRECTED. DX E11.22.  5  . oxyCODONE (OXY IR/ROXICODONE) 5 MG immediate release tablet Take 0.5 tablets (2.5 mg total) by mouth every 4 (four) hours as needed for severe pain. 90 tablet 0  . [START ON 07/12/2016] oxyCODONE (OXY IR/ROXICODONE) 5 MG immediate release tablet Take 0.5 tablets (2.5 mg total) by mouth every 4 (four) hours as needed for severe pain. 90 tablet 0  . pantoprazole (PROTONIX) 40 MG tablet Take by mouth.    . ranitidine (ZANTAC) 150 MG tablet TAKE 1 TABLET (150 MG TOTAL) BY MOUTH 2 (TWO) TIMES DAILY. AS NEEDED  11  . zolpidem (AMBIEN) 5 MG tablet TAKE 1/2-1 TABLET BY MOUTH AT BEDTIME AS NEEDED FOR SLEEP  0   No current facility-administered medications for this visit.     Review of Systems:  GENERAL:  Feels good.  No fevers or sweats.  Weight up 3 pounds. PERFORMANCE STATUS (ECOG):  0 HEENT:  No visual changes, runny nose, sore throat, mouth sores or tenderness. Lungs:  Shortness of breath with exertion.  No cough.  No hemoptysis. Cardiac:  No chest pain, palpitations, orthopnea, or PND. GI:  No nausea, vomiting, diarrhea, constipation, melena or hematochezia. GU:  No urgency, frequency, dysuria, or hematuria. Musculoskeletal: h/o back surgery.  Left knee arthritis.  No muscle tenderness. Extremities:  No pain or swelling. Skin:  No rashes or skin changes. Neuro:  Recent on/off headaches.  No numbness or weakness, balance or coordination issues. Endocrine:  No diabetes, thyroid issues, hot flashes or night sweats. Psych:  No mood changes.  h/o depression .  No anxiety. Pain:  No focal pain. Review of systems:  All other systems reviewed and found to be negative.  Physical Exam: There were no vitals taken for this visit. GENERAL:  Well developed, well nourished, sitting comfortably in the exam room in no acute distress. MENTAL STATUS:  Alert and oriented to person, place and time. HEAD:  Brown hair.  Normocephalic, atraumatic, face symmetric, no Cushingoid  features. EYES:  Glasses.  Brown eyes.  No conjunctivitis or scleral icterus. NEUROLOGICAL: Unremarkable. PSYCH:  Appropriate.  No visits with results within 3 Day(s) from this visit.  Latest known visit with results is:  Clinical Support on 06/12/2016  Component Date Value Ref Range Status  . Summary 06/12/2016 FINAL   Final   Comment: ==================================================================== TOXASSURE SELECT 13 (MW) ==================================================================== Test                             Result       Flag       Units Drug Present and Declared for Prescription Verification   Oxycodone  206          EXPECTED   ng/mg creat   Oxymorphone                    183          EXPECTED   ng/mg creat   Noroxycodone                   293          EXPECTED   ng/mg creat   Noroxymorphone                 69           EXPECTED   ng/mg creat    Sources of oxycodone are scheduled prescription medications.    Oxymorphone, noroxycodone, and noroxymorphone are expected    metabolites of oxycodone. Oxymorphone is also available as a    scheduled prescription medication. Drug Absent but Declared for Prescription Verification   Alprazolam                     Not Detected UNEXPECTED ng/mg creat ==================================================================== Test                                                Result    Flag   Units      Ref Range   Creatinine              216              mg/dL      >=20 ==================================================================== Declared Medications:  The flagging and interpretation on this report are based on the  following declared medications.  Unexpected results may arise from  inaccuracies in the declared medications.  **Note: The testing scope of this panel includes these medications:  Alprazolam  Oxycodone  **Note: The testing scope of this panel does not include following  reported  medications:  Acetaminophen  Albuterol  Aspirin (Aspirin 81)  Cyanocobalamin  Felodipine  Glimepiride  Ondansetron  Pantoprazole  Ranitidine  Vitamin C  Zolpidem ==================================================================== For clinical consultation, please call [phone]. ====================================================================     Assessment:  CHAMILLE WERNTZ is a 66 y.o. female with a IgG monoclonal gammopathy noted on screening labs.  She likely has a monoclonal gammopathy of unknown significance (MGUS).  SPEP on 11/29/2015 revealed a 0.4 g/dL IgG monoclonal protein with kappa light chain specificity.  Creatinine was 0.9, albumen 4.8, and calcium 10.4 (8.7-10.3).  Work-up on 12/21/2015 revealed the following normal labs:  CBC with diff, CMP, immunoglobulin levels, and beta-2 microglobulin (1.9).  SPEP revealed a 0.6 g/dL IgG monoclonal protein with kappa light chain specificity.  Kappa free light chains were 24.7, lambda free light chain 17.5 with a ratio of 1.41 (normal).  24 hour UPEP was negative.  Bone survey on 12/28/2015 revealed no radiographically worrisome osteolytic lesions identified in the axial or appendicular skeleton.  Symptomatically, she denies any bone pain or recurrent infections.  Exam is normal.  Plan: 1.  Labs today:  CBC with diff, CMP, SPEP. 2.   Lequita Asal, MD  07/10/2016, 6:36 AM

## 2016-07-30 DIAGNOSIS — Z0000 Encounter for general adult medical examination without abnormal findings: Secondary | ICD-10-CM | POA: Insufficient documentation

## 2016-08-12 ENCOUNTER — Ambulatory Visit: Payer: 59 | Admitting: Nurse Practitioner

## 2016-08-20 ENCOUNTER — Ambulatory Visit: Payer: 59 | Admitting: Pain Medicine

## 2016-08-29 DIAGNOSIS — M5030 Other cervical disc degeneration, unspecified cervical region: Secondary | ICD-10-CM | POA: Insufficient documentation

## 2016-09-09 ENCOUNTER — Other Ambulatory Visit: Payer: Self-pay | Admitting: Internal Medicine

## 2016-09-09 DIAGNOSIS — Z1231 Encounter for screening mammogram for malignant neoplasm of breast: Secondary | ICD-10-CM

## 2016-09-11 NOTE — Progress Notes (Signed)
Patient's Name: ELIAS BORDNER  MRN: [id number]  Referring Provider: Kirk Ruths, MD  DOB: 04-03-1950  PCP: Kirk Ruths, MD  DOS: 09/15/2016  Note by: Vevelyn Francois NP  Service setting: Ambulatory outpatient  Specialty: Interventional Pain Management  Location: [facility] (AMB) Pain Management Facility    Patient type: Established    Primary Reason(s) for Visit: Encounter for prescription drug management. (Level of risk: moderate)  CC: Back Pain (lower); Shoulder Pain (left); and Hip Pain (right)  HPI  Ms. Edelman is a 66 y.o. year old, female patient, who comes today for a medication management evaluation. She has; Chronic low back pain (Location of Primary Source of Pain) (Bilateral) (R>L); Lumbar spondylosis; Failed back surgical syndrome; Lumbar facet syndrome (Bilateral) (R>L);  Chronic hip pain(2) (Bilateral) (L>R); Greater trochanteric bursitis of hips (Bilateral); Chronic sacroiliac joint pain (Bilateral); Osteoarthritis of hips (Bilateral); Pain management; Chronic pain of lower extremity  (Bilateral) (L>R);  Chronic cervical radicular pain; Neurogenic pain; Chronic kidney disease; Chronic neck pain (3) (B) (R>L); and Chronic pain syndrome on her problem list. Her primarily concern today is the Back Pain (lower); Shoulder Pain (left); and Hip Pain (right)  Pain Assessment: Location: Lower Back Radiating: stays in lower back, not sure if it goes to the hip Onset: More than a month ago Duration: Chronic pain Quality: Aching, Constant, Sharp, Tender Severity: 2 /10 (self-reported pain score)  Note: Reported level is compatible with observation.                   Effect on ADL: pace self Timing: Constant Modifying factors: medicine  and heating pad  Ms. Rayo was last scheduled for an appointment on 06/12/2016 for medication management. During today's appointment we reviewed Ms. Miramontes's chronic pain status, as well as her outpatient medication regimen. She admits that  she was having some acute left shoulder pain. She was seen by ortho in [place] and was given a steroid injection. She admits that a neck xray was also completed. The injection was not effective.   The patient  reports that she does not use drugs. Her body mass index is 32.95 kg/m.  Further details on both, my assessment(s), as well as the proposed treatment plan, please see below.  Controlled Substance Pharmacotherapy Assessment REMS (Risk Evaluation and Mitigation Strategy)  Analgesic: Oxycodone 5 mg 3 times daily (oxycodone 15 mg per day) MME/day: 22.5 mg/day.  Lona Millard, RN  09/15/2016 12:49 PM  Sign at close encounter Nursing Pain Medication Assessment:  Safety precautions to be maintained throughout the outpatient stay will include: orient to surroundings, keep bed in low position, maintain call bell within reach at all times, provide assistance with transfer out of bed and ambulation.  Medication Inspection Compliance: Pill count conducted under aseptic conditions, in front of the patient. Neither the pills nor the bottle was removed from the patient's sight at any time. Once count was completed pills were immediately returned to the patient in their original bottle.  Medication: Oxycodone IR Pill/Patch Count: 0 of 90 pills remain Pill/Patch Appearance: No markings Bottle Appearance: Standard pharmacy container. Clearly labeled. Filled Date: 04 / 18 / 2018 Last Medication intake:  Ran out of medicine more than 48 hours ago   Pharmacokinetics: Liberation and absorption (onset of action): WNL Distribution (time to peak effect): WNL Metabolism and excretion (duration of action): WNL         Pharmacodynamics: Desired effects: Analgesia: Ms. Wishon reports >50% benefit. Functional ability: Patient reports  that medication allows her to accomplish basic ADLs Clinically meaningful improvement in function (CMIF): Sustained CMIF goals met Perceived effectiveness: Described as  relatively effective, allowing for increase in activities of daily living (ADL) Undesirable effects: Side-effects or Adverse reactions: None reported Monitoring: [place] PMP: Online review of the past 7-monthperiod conducted. Compliant with practice rules and regulations List of all UDS test(s) done:  Lab Results  Component Value Date   TOXASSSELUR FINAL 10/17/2015   TOXASSSELUR FINAL 05/23/2015   TOXASSSELUR FINAL 04/24/2015   THandleyFINAL 01/31/2015   SUMMARY FINAL 06/12/2016   Last UDS on record: ToxAssure Select 13  Date Value Ref Range Status  10/17/2015 FINAL  Final    Comment:    ==================================================================== TOXASSURE SELECT 13 (MW) ==================================================================== Test                             Result       Flag       Units Drug Absent but Declared for Prescription Verification   Alprazolam                     Not Detected UNEXPECTED ng/mg creat   Oxycodone                      Not Detected UNEXPECTED ng/mg creat ==================================================================== Test                      Result    Flag   Units      Ref Range   Creatinine              133              mg/dL      >=20 ==================================================================== Declared Medications:  The flagging and interpretation on this report are based on the  following declared medications.  Unexpected results may arise from  inaccuracies in the declared medications.  **Note: The testing scope of this panel includes these medications:  Alprazolam (Xanax)  Oxycodone  **Note: The testing scope of this panel does not include following  reported medications:  Acetaminophen (Tylenol)  Albuterol (Proventil)  Aspirin (Aspirin 81)  Cyanocobalamin  Felodipine  Metformin  Ondansetron  Pantoprazole  Ranitidine  Spironolactone  Vitamin C   Zolpidem ==================================================================== For clinical consultation, please call ([phone] ====================================================================    Summary  Date Value Ref Range Status  06/12/2016 FINAL  Final    Comment:    ==================================================================== TOXASSURE SELECT 13 (MW) ==================================================================== Test                             Result       Flag       Units Drug Present and Declared for Prescription Verification   Oxycodone                      206          EXPECTED   ng/mg creat   Oxymorphone                    183          EXPECTED   ng/mg creat   Noroxycodone                   293  EXPECTED   ng/mg creat   Noroxymorphone                 69           EXPECTED   ng/mg creat    Sources of oxycodone are scheduled prescription medications.    Oxymorphone, noroxycodone, and noroxymorphone are expected    metabolites of oxycodone. Oxymorphone is also available as a    scheduled prescription medication. Drug Absent but Declared for Prescription Verification   Alprazolam                     Not Detected UNEXPECTED ng/mg creat ==================================================================== Test                      Result    Flag   Units      Ref Range   Creatinine              216              mg/dL      >=20 ==================================================================== Declared Medications:  The flagging and interpretation on this report are based on the  following declared medications.  Unexpected results may arise from  inaccuracies in the declared medications.  **Note: The testing scope of this panel includes these medications:  Alprazolam  Oxycodone  **Note: The testing scope of this panel does not include following  reported medications:  Acetaminophen  Albuterol  Aspirin (Aspirin 81)  Cyanocobalamin   Felodipine  Glimepiride  Ondansetron  Pantoprazole  Ranitidine  Vitamin C  Zolpidem ==================================================================== For clinical consultation, please call [phone]. ====================================================================    UDS interpretation: Compliant          Medication Assessment Form: Reviewed. Patient indicates being compliant with therapy Treatment compliance: Compliant Risk Assessment Profile: Aberrant behavior: See prior evaluations. None observed or detected today Comorbid factors increasing risk of overdose: See prior notes. No additional risks detected today Risk of substance use disorder (SUD): Low     Opioid Risk Tool - 09/15/16 1141      Family History of Substance Abuse   Alcohol Negative   Illegal Drugs Negative   Rx Drugs Negative     Personal History of Substance Abuse   Alcohol Negative   Illegal Drugs Negative   Rx Drugs Negative     Age   Age between 95-45 years  No     History of Preadolescent Sexual Abuse   History of Preadolescent Sexual Abuse Negative or Female     Psychological Disease   Psychological Disease Negative   Depression Positive     Total Score   Opioid Risk Tool Scoring 1   Opioid Risk Interpretation Low Risk     ORT Scoring interpretation table:  Score <3 = Low Risk for SUD  Score between 4-7 = Moderate Risk for SUD  Score >8 = High Risk for Opioid Abuse   Risk Mitigation Strategies:  Patient Counseling: Covered Patient-Prescriber Agreement (PPA): Present and active  Notification to other healthcare providers: Done  Pharmacologic Plan: No change in therapy, at this time  Laboratory Chemistry  Inflammation Markers (CRP: Acute Phase) (ESR: Chronic Phase) Lab Results  Component Value Date   CRP 0.6 04/25/2015   ESRSEDRATE 7 04/25/2015                 Renal Function Markers Lab Results  Component Value Date   BUN 17 06/03/2016   CREATININE 0.72 06/03/2016  GFRAA >60 06/03/2016   GFRNONAA >60 06/03/2016                 Hepatic Function Markers Lab Results  Component Value Date   AST 25 12/21/2015   ALT 15 12/21/2015   ALBUMIN 4.3 12/21/2015   ALKPHOS 84 12/21/2015                 Electrolytes Lab Results  Component Value Date   NA 136 06/03/2016   K 4.1 06/03/2016   CL 105 06/03/2016   CALCIUM 9.4 06/03/2016   MG 1.9 04/25/2015                 Neuropathy Markers Lab Results  Component Value Date   VITAMINB12 178 (L) 04/25/2015                 Bone Pathology Markers Lab Results  Component Value Date   ALKPHOS 84 12/21/2015   VD125OH2TOT 55 04/25/2015   [id number] 55 04/25/2015   VD2125OH2 <10 04/25/2015   CALCIUM 9.4 06/03/2016                 Coagulation Parameters Lab Results  Component Value Date   INR 1.0 09/16/2012   LABPROT 13.3 09/16/2012   PLT 234 06/03/2016                 Cardiovascular Markers Lab Results  Component Value Date   HGB 13.2 06/03/2016   HCT 39.9 06/03/2016                 Note: Lab results reviewed.  Recent Diagnostic Imaging Review  Dg Chest 2 View  Result Date: 06/03/2016 CLINICAL DATA:  Central chest pain EXAM: CHEST  2 VIEW COMPARISON:  12/28/2015 FINDINGS: The heart size and mediastinal contours are within normal limits. Both lungs are clear. The visualized skeletal structures are unremarkable. IMPRESSION: No active cardiopulmonary disease. Electronically Signed   By: Inez Catalina M.D.   On: 06/03/2016 13:14   Note: Imaging results reviewed.          Meds   Current Meds  Medication Sig  . acetaminophen (TYLENOL) 500 MG tablet Take 1,000-1,500 mg by mouth every 6 (six) hours as needed for mild pain.  Marland Kitchen albuterol (PROVENTIL HFA;VENTOLIN HFA) 108 (90 Base) MCG/ACT inhaler Inhale 2 puffs into the lungs every 6 (six) hours as needed for wheezing or shortness of breath.  . ALPRAZolam (XANAX) 0.25 MG tablet Take 0.125-0.25 mg by mouth 2 (two) times daily as needed for anxiety  or sleep.   . Ascorbic Acid (VITAMIN C PO) Take 1 tablet by mouth at bedtime.  Marland Kitchen aspirin EC 81 MG tablet Take 81 mg by mouth at bedtime.  . Blood Glucose Monitoring Suppl (FIFTY50 GLUCOSE METER 2.0) w/Device KIT Use as directed. Dx E11.22 ONE TOUCH  . felodipine (PLENDIL) 10 MG 24 hr tablet Take 10 mg by mouth at bedtime.  Marland Kitchen glimepiride (AMARYL) 2 MG tablet Take 2 mg by mouth daily with breakfast.  . ondansetron (ZOFRAN ODT) 4 MG disintegrating tablet Allow 1-2 tablets to dissolve in your mouth every 8 hours as needed for nausea/vomiting  . ONE TOUCH ULTRA TEST test strip USE TO CHECK BLOOD SUGAR 1-2 TIMES DAILY AS DIRECTED. DX E11.22.  . pantoprazole (PROTONIX) 40 MG tablet Take 40 mg by mouth as needed.   . ranitidine (ZANTAC) 150 MG tablet TAKE 1 TABLET (150 MG TOTAL) BY MOUTH 2 (TWO) TIMES DAILY. AS NEEDED  . zolpidem (  AMBIEN) 5 MG tablet TAKE 1/2-1 TABLET BY MOUTH AT BEDTIME AS NEEDED FOR SLEEP  . [DISCONTINUED] ondansetron (ZOFRAN ODT) 4 MG disintegrating tablet Allow 1-2 tablets to dissolve in your mouth every 8 hours as needed for nausea/vomiting    ROS  Constitutional: Denies any fever or chills Gastrointestinal: No reported hemesis, hematochezia, vomiting, or acute GI distress Musculoskeletal: Denies any acute onset joint swelling, redness, loss of ROM, or weakness Neurological: No reported episodes of acute onset apraxia, aphasia, dysarthria, agnosia, amnesia, paralysis, loss of coordination, or loss of consciousness  Allergies  Ms. Heisler is allergic to codeine; dilaudid [hydromorphone hcl]; percocet [oxycodone-acetaminophen]; tramadol; gabapentin; hydrocodone-acetaminophen; hydromorphone; oxycodone-acetaminophen; promethazine; and scallops [shellfish allergy].  PFSH  Drug: Ms. Ludke  reports that she does not use drugs. Alcohol:  reports that she drinks alcohol. Tobacco:  reports that she quit smoking about 12 years ago. She has a 20.00 pack-year smoking history. She has never  used smokeless tobacco. Medical:  has a past medical history of Asthma; Depression; Diabetes mellitus; Hepatitis C; Hypertension; and Insomnia. Surgical: Ms. Helmuth  has a past surgical history that includes Back surgery (2013); Hysteroscopy w/D&C (N/A, 08/18/2014); Knee arthroscopy (Right); Colonoscopy w/ polypectomy; and Colonoscopy with propofol (N/A, 03/13/2015). Family: family history includes Colon cancer in her other; Coronary artery disease (age of onset: 58) in her brother; Coronary artery disease (age of onset: 31) in her mother; Diabetes in her father and other; Heart attack in her brother; Heart disease in her father; Hypertension in her other.  Constitutional Exam  General appearance: Well nourished, well developed, and well hydrated. In no apparent acute distress Vitals:   09/15/16 1132  BP: 140/81  Pulse: 85  Resp: 16  Temp: 98.2 F (36.8 C)  SpO2: 96%  Weight: 186 lb (84.4 kg)  Height: 5' 3" (1.6 m)   BMI Assessment: Estimated body mass index is 32.95 kg/m as calculated from the following:   Height as of this encounter: 5' 3" (1.6 m).   Weight as of this encounter: 186 lb (84.4 kg).  Psych/Mental status: Alert, oriented x 3 (person, place, & time)       Eyes: PERLA Respiratory: No evidence of acute respiratory distress  Cervical Spine Area Exam  Skin & Axial Inspection: No masses, redness, edema, swelling, or associated skin lesions Alignment: Symmetrical Functional ROM: Unrestricted ROM      Stability: No instability detected Muscle Tone/Strength: Functionally intact. No obvious neuro-muscular anomalies detected. Sensory (Neurological): Unimpaired Palpation: No palpable anomalies              Upper Extremity (UE) Exam    Side: Right upper extremity  Side: Left upper extremity  Skin & Extremity Inspection: Skin color, temperature, and hair growth are WNL. No peripheral edema or cyanosis. No masses, redness, swelling, asymmetry, or associated skin lesions. No  contractures.  Skin & Extremity Inspection: Skin color, temperature, and hair growth are WNL. No peripheral edema or cyanosis. No masses, redness, swelling, asymmetry, or associated skin lesions. No contractures.  Functional ROM: Unrestricted ROM          Functional ROM: Limited ROM          Muscle Tone/Strength: Functionally intact. No obvious neuro-muscular anomalies detected.  Muscle Tone/Strength:  L guarding  Sensory (Neurological): Unimpaired  Sensory (Neurological): Unimpaired  Palpation: No palpable anomalies              Palpation: No palpable anomalies  Specialized Test(s): Deferred         Specialized Test(s): Deferred           Lumbar Spine Area Exam  Skin & Axial Inspection: No masses, redness, or swelling Alignment: Symmetrical Functional ROM: Unrestricted ROM      Stability: No instability detected Muscle Tone/Strength: Functionally intact. No obvious neuro-muscular anomalies detected. Sensory (Neurological): Unimpaired Palpation: No palpable anomalies       Provocative Tests: Lumbar Hyperextension and rotation test: evaluation deferred today       Lumbar Lateral bending test: evaluation deferred today       Patrick's Maneuver: evaluation deferred today                    Gait & Posture Assessment  Ambulation: Unassisted Gait: Relatively normal for age and body habitus Posture: WNL   Lower Extremity Exam    Side: Right lower extremity  Side: Left lower extremity  Skin & Extremity Inspection: Skin color, temperature, and hair growth are WNL. No peripheral edema or cyanosis. No masses, redness, swelling, asymmetry, or associated skin lesions. No contractures.  Skin & Extremity Inspection: Skin color, temperature, and hair growth are WNL. No peripheral edema or cyanosis. No masses, redness, swelling, asymmetry, or associated skin lesions. No contractures.  Functional ROM: Unrestricted ROM          Functional ROM: Unrestricted ROM          Muscle Tone/Strength:  Functionally intact. No obvious neuro-muscular anomalies detected.  Muscle Tone/Strength: Functionally intact. No obvious neuro-muscular anomalies detected.  Sensory (Neurological): Unimpaired  Sensory (Neurological): Unimpaired  Palpation: No palpable anomalies  Palpation: No palpable anomalies   Assessment  Primary Diagnosis & Pertinent Problem List: The primary encounter diagnosis was Chronic low back pain (Location of Primary Source of Pain) (Bilateral) (R>L). Diagnoses of Lumbar facet syndrome (Bilateral) (R>L), Chronic neck pain (3) (B) (R>L), and Chronic pain syndrome were also pertinent to this visit.  Status Diagnosis  Controlled Controlled Controlled 1. Chronic low back pain (Location of Primary Source of Pain) (Bilateral) (R>L)   2. Lumbar facet syndrome (Bilateral) (R>L)   3. Chronic neck pain (3) (B) (R>L)   4. Chronic pain syndrome     Problems updated and reviewed during this visit: No problems updated. Plan of Care  Pharmacotherapy (Medications Ordered): Meds ordered this encounter  Medications  . oxyCODONE (OXY IR/ROXICODONE) 5 MG immediate release tablet    Sig: Take 0.5 tablets (2.5 mg total) by mouth every 4 (four) hours as needed for severe pain.    Dispense:  90 tablet    Refill:  0    Do not place this medication, or any other prescription from our practice, on "Automatic Refill". Patient may have prescription filled one day early if pharmacy is closed on scheduled refill date. Do not fill until: 09/15/2016 To last until:10/15/2016    Order Specific Question:   Supervising Provider    Answer:   Milinda Pointer [phone]  . oxyCODONE (OXY IR/ROXICODONE) 5 MG immediate release tablet    Sig: Take 0.5 tablets (2.5 mg total) by mouth every 4 (four) hours as needed for severe pain.    Dispense:  90 tablet    Refill:  0    Do not place this medication, or any other prescription from our practice, on "Automatic Refill". Patient may have prescription filled one day  early if pharmacy is closed on scheduled refill date. Do not fill until: 10/15/2016 To last until: 11/14/2016  Order Specific Question:   Supervising Provider    Answer:   Milinda Pointer [phone]  . ondansetron (ZOFRAN ODT) 4 MG disintegrating tablet    Sig: Allow 1-2 tablets to dissolve in your mouth every 8 hours as needed for nausea/vomiting    Dispense:  30 tablet    Refill:  0    Order Specific Question:   Supervising Provider    AnswerMilinda Pointer [phone]  . ibuprofen (ADVIL,MOTRIN) 800 MG tablet    Sig: Take 1 tablet (800 mg total) by mouth every 8 (eight) hours as needed.    Dispense:  30 tablet    Refill:  0    Order Specific Question:   Supervising Provider    Answer:   Milinda Pointer [phone]   New Prescriptions   IBUPROFEN (ADVIL,MOTRIN) 800 MG TABLET    Take 1 tablet (800 mg total) by mouth every 8 (eight) hours as needed.   Medications administered today: Ms. Savell had no medications administered during this visit. Lab-work, procedure(s), and/or referral(s): No orders of the defined types were placed in this encounter.  Imaging and/or referral(s): None  Interventional therapies: Planned, scheduled, and/or pending:   Continue with current regimen.  Discussed RFA and benefits declined secondary to fear Discussed cervical epidural steroid injection for radicular shoulder pain; not interested at this time   Considering:   Repeat diagnostic lumbar facet block Possible bilateral lumbar facet radiofrequency ablation Possible diagnostic bilateral sacroiliac joint block Possible diagnostic bilateral intra-articular hip joint injection    Palliative PRN treatment(s):   For the low back pain, bilateral diagnostic lumbar facet block + diagnostic bilateral sacroiliac joint injection  For the hip pain, diagnostic intra-articular hip joint injection      Provider-requested follow-up: Return in about 2 months (around 11/15/2016) for  MedMgmt.  Future Appointments Date Time Provider [place]  09/23/2016 3:40 PM [facility] [place]   Primary Care Physician: Kirk Ruths, MD Location: [facility] Note by: Vevelyn Francois NP Date: 09/15/2016; Time: 1:16 PM  Pain Score Disclaimer: We use the NRS-11 scale. This is a self-reported, subjective measurement of pain severity with only modest accuracy. It is used primarily to identify changes within a particular patient. It must be understood that outpatient pain scales are significantly less accurate that those used for research, where they can be applied under ideal controlled circumstances with minimal exposure to variables. In reality, the score is likely to be a combination of pain intensity and pain affect, where pain affect describes the degree of emotional arousal or changes in action readiness caused by the sensory experience of pain. Factors such as social and work situation, setting, emotional state, anxiety levels, expectation, and prior pain experience may influence pain perception and show large inter-individual differences that may also be affected by time variables.  Patient instructions provided during this appointment: Patient Instructions   ____________________________________________________________________________________________  Medication Rules  Applies to: All patients receiving prescriptions (written or electronic).  Pharmacy of record: Pharmacy where electronic prescriptions will be sent. If written prescriptions are taken to a different pharmacy, please inform the nursing staff. The pharmacy listed in the electronic medical record should be the one where you would like electronic prescriptions to be sent.  Prescription refills: Only during scheduled appointments. Applies to both, written and electronic prescriptions.  NOTE: The following applies primarily to controlled substances (Opioid* Pain Medications).    Patient's responsibilities: 1. Pain Pills: Bring all pain pills to every appointment (  except for procedure appointments). 2. Pill Bottles: Bring pills in original pharmacy bottle. Always bring newest bottle. Bring bottle, even if empty. 3. Medication refills: You are responsible for knowing and keeping track of what medications you need refilled. The day before your appointment, write a list of all prescriptions that need to be refilled. Bring that list to your appointment and give it to the admitting nurse. Prescriptions will be written only during appointments. If you forget a medication, it will not be "Called in", "Faxed", or "electronically sent". You will need to get another appointment to get these prescribed. 4. Prescription Accuracy: You are responsible for carefully inspecting your prescriptions before leaving our office. Have the discharge nurse carefully go over each prescription with you, before taking them home. Make sure that your name is accurately spelled, that your address is correct. Check the name and dose of your medication to make sure it is accurate. Check the number of pills, and the written instructions to make sure they are clear and accurate. Make sure that you are given enough medication to last until your next medication refill appointment. 5. Taking Medication: Take medication as prescribed. Never take more pills than instructed. Never take medication more frequently than prescribed. Taking less pills or less frequently is permitted and encouraged, when it comes to controlled substances (written prescriptions).  6. Inform other Doctors: Always inform, all of your healthcare providers, of all the medications you take. 7. Pain Medication from other Providers: You are not allowed to accept any additional pain medication from any other Doctor or Healthcare provider. There are two exceptions to this rule. (see below) In the event that you require additional pain medication, you  are responsible for notifying us, as stated below. 8. Medication Agreement: You are responsible for carefully reading and following our Medication Agreement. This must be signed before receiving any prescriptions from our practice. Safely store a copy of your signed Agreement. Violations to the Agreement will result in no further prescriptions. (Additional copies of our Medication Agreement are available upon request.) 9. Laws, Rules, & Regulations: All patients are expected to follow all Federal and [employer], [employer], Rules, [employer]. Ignorance of the Laws does not constitute a valid excuse. The use of any illegal substances is prohibited. 10. Adopted CDC guidelines & recommendations: Target dosing levels will be at or below 60 MME/day. Use of benzodiazepines** is not recommended.  Exceptions: There are only two exceptions to the rule of not receiving pain medications from other Healthcare Providers. 1. Exception #1 (Emergencies): In the event of an emergency (i.e.: accident requiring emergency care), you are allowed to receive additional pain medication. However, you are responsible for: As soon as you are able, call our office [phone], at any time of the day or night, and leave a message stating your name, the date and nature of the emergency, and the name and dose of the medication prescribed. In the event that your call is answered by a member of our staff, make sure to document and save the date, time, and the name of the person that took your information.  2. Exception #2 (Planned Surgery): In the event that you are scheduled by another doctor or dentist to have any type of surgery or procedure, you are allowed (for a period no longer than 30 days), to receive additional pain medication, for the acute post-op pain. However, in this case, you are responsible for picking up a copy of our "Post-op Pain Management for Surgeons" handout,  and giving it to your surgeon or dentist. This document  is available at our office, and does not require an appointment to obtain it. Simply go to our office during business hours (Monday-Thursday from 8:00 AM to 4:00 PM) (Friday 8:00 AM to 12:00 Noon) or if you have a scheduled appointment with [place], prior to your surgery, and ask for it by name. In addition, you will need to provide [place] with your name, name of your surgeon, type of surgery, and date of procedure or surgery.  *Opioid medications include: morphine, codeine, oxycodone, oxymorphone, hydrocodone, hydromorphone, meperidine, tramadol, tapentadol, buprenorphine, fentanyl, methadone. **Benzodiazepine medications include: diazepam (Valium), alprazolam (Xanax), clonazepam (Klonopine), lorazepam (Ativan), clorazepate (Tranxene), chlordiazepoxide (Librium), estazolam (Prosom), oxazepam (Serax), temazepam (Restoril), triazolam (Halcion)  ____________________________________________________________________________________________ Pain Management Discharge Instructions  General Discharge Instructions :  If you need to reach your doctor call: Monday-Friday 8:00 am - 4:00 pm at [phone] or toll free [phone].  After clinic hours [phone] to have operator reach doctor.  Bring all of your medication bottles to all your appointments in the pain clinic.  To cancel or reschedule your appointment with Pain Management please remember to call 24 hours in advance to avoid a fee.  Refer to the educational materials which you have been given on: General Risks, I had my Procedure. Discharge Instructions, Post Sedation.  Post Procedure Instructions:  The drugs you were given will stay in your system until tomorrow, so for the next 24 hours you should not drive, make any legal decisions or drink any alcoholic beverages.  You may eat anything you prefer, but it is better to start with liquids then soups and crackers, and gradually work up to solid foods.  Please notify your doctor immediately if you  have any unusual bleeding, trouble breathing or pain that is not related to your normal pain.  Depending on the type of procedure that was done, some parts of your body may feel week and/or numb.  This usually clears up by tonight or the next day.  Walk with the use of an assistive device or accompanied by an adult for the 24 hours.  You may use ice on the affected area for the first 24 hours.  Put ice in a Ziploc bag and cover with a towel and place against area 15 minutes on 15 minutes off.  You may switch to heat after 24 hours.

## 2016-09-15 ENCOUNTER — Encounter: Payer: Self-pay | Admitting: Nurse Practitioner

## 2016-09-15 ENCOUNTER — Ambulatory Visit: Payer: 59 | Attending: Nurse Practitioner | Admitting: Nurse Practitioner

## 2016-09-15 VITALS — BP 140/81 | HR 85 | Temp 98.2°F | Resp 16 | Ht 63.0 in | Wt 186.0 lb

## 2016-09-15 DIAGNOSIS — M47816 Spondylosis without myelopathy or radiculopathy, lumbar region: Secondary | ICD-10-CM | POA: Insufficient documentation

## 2016-09-15 DIAGNOSIS — M25551 Pain in right hip: Secondary | ICD-10-CM | POA: Diagnosis present

## 2016-09-15 DIAGNOSIS — Z7984 Long term (current) use of oral hypoglycemic drugs: Secondary | ICD-10-CM | POA: Insufficient documentation

## 2016-09-15 DIAGNOSIS — Z7982 Long term (current) use of aspirin: Secondary | ICD-10-CM | POA: Insufficient documentation

## 2016-09-15 DIAGNOSIS — G8929 Other chronic pain: Secondary | ICD-10-CM | POA: Diagnosis not present

## 2016-09-15 DIAGNOSIS — Z87891 Personal history of nicotine dependence: Secondary | ICD-10-CM | POA: Diagnosis not present

## 2016-09-15 DIAGNOSIS — G894 Chronic pain syndrome: Secondary | ICD-10-CM | POA: Diagnosis not present

## 2016-09-15 DIAGNOSIS — N189 Chronic kidney disease, unspecified: Secondary | ICD-10-CM | POA: Diagnosis not present

## 2016-09-15 DIAGNOSIS — M7062 Trochanteric bursitis, left hip: Secondary | ICD-10-CM | POA: Insufficient documentation

## 2016-09-15 DIAGNOSIS — E1122 Type 2 diabetes mellitus with diabetic chronic kidney disease: Secondary | ICD-10-CM | POA: Insufficient documentation

## 2016-09-15 DIAGNOSIS — M4696 Unspecified inflammatory spondylopathy, lumbar region: Secondary | ICD-10-CM

## 2016-09-15 DIAGNOSIS — M25552 Pain in left hip: Secondary | ICD-10-CM | POA: Diagnosis not present

## 2016-09-15 DIAGNOSIS — M160 Bilateral primary osteoarthritis of hip: Secondary | ICD-10-CM | POA: Diagnosis not present

## 2016-09-15 DIAGNOSIS — M79604 Pain in right leg: Secondary | ICD-10-CM | POA: Diagnosis not present

## 2016-09-15 DIAGNOSIS — M533 Sacrococcygeal disorders, not elsewhere classified: Secondary | ICD-10-CM | POA: Diagnosis not present

## 2016-09-15 DIAGNOSIS — M7061 Trochanteric bursitis, right hip: Secondary | ICD-10-CM | POA: Insufficient documentation

## 2016-09-15 DIAGNOSIS — M545 Low back pain: Secondary | ICD-10-CM | POA: Insufficient documentation

## 2016-09-15 DIAGNOSIS — M5412 Radiculopathy, cervical region: Secondary | ICD-10-CM | POA: Insufficient documentation

## 2016-09-15 DIAGNOSIS — M542 Cervicalgia: Secondary | ICD-10-CM | POA: Diagnosis not present

## 2016-09-15 DIAGNOSIS — J45909 Unspecified asthma, uncomplicated: Secondary | ICD-10-CM | POA: Insufficient documentation

## 2016-09-15 DIAGNOSIS — M79605 Pain in left leg: Secondary | ICD-10-CM | POA: Insufficient documentation

## 2016-09-15 DIAGNOSIS — F329 Major depressive disorder, single episode, unspecified: Secondary | ICD-10-CM | POA: Diagnosis not present

## 2016-09-15 DIAGNOSIS — I129 Hypertensive chronic kidney disease with stage 1 through stage 4 chronic kidney disease, or unspecified chronic kidney disease: Secondary | ICD-10-CM | POA: Insufficient documentation

## 2016-09-15 DIAGNOSIS — M25512 Pain in left shoulder: Secondary | ICD-10-CM | POA: Diagnosis present

## 2016-09-15 MED ORDER — OXYCODONE HCL 5 MG PO TABS: 2.5000 mg | ORAL_TABLET | ORAL | 0 refills | Status: DC | PRN

## 2016-09-15 MED ORDER — IBUPROFEN 800 MG PO TABS: 800.0000 mg | ORAL_TABLET | Freq: Three times a day (TID) | ORAL | 0 refills | Status: AC | PRN

## 2016-09-15 MED ORDER — ONDANSETRON 4 MG PO TBDP: ORAL_TABLET | ORAL | 0 refills | Status: DC

## 2016-09-15 NOTE — Patient Instructions (Addendum)
____________________________________________________________________________________________  Medication Rules  Applies to: All patients receiving prescriptions (written or electronic).  Pharmacy of record: Pharmacy where electronic prescriptions will be sent. If written prescriptions are taken to a different pharmacy, please inform the nursing staff. The pharmacy listed in the electronic medical record should be the one where you would like electronic prescriptions to be sent.  Prescription refills: Only during scheduled appointments. Applies to both, written and electronic prescriptions.  NOTE: The following applies primarily to controlled substances (Opioid* Pain Medications).   Patient's responsibilities: 1. Pain Pills: Bring all pain pills to every appointment (except for procedure appointments). 2. Pill Bottles: Bring pills in original pharmacy bottle. Always bring newest bottle. Bring bottle, even if empty. 3. Medication refills: You are responsible for knowing and keeping track of what medications you need refilled. The day before your appointment, write a list of all prescriptions that need to be refilled. Bring that list to your appointment and give it to the admitting nurse. Prescriptions will be written only during appointments. If you forget a medication, it will not be "Called in", "Faxed", or "electronically sent". You will need to get another appointment to get these prescribed. 4. Prescription Accuracy: You are responsible for carefully inspecting your prescriptions before leaving our office. Have the discharge nurse carefully go over each prescription with you, before taking them home. Make sure that your name is accurately spelled, that your address is correct. Check the name and dose of your medication to make sure it is accurate. Check the number of pills, and the written instructions to make sure they are clear and accurate. Make sure that you are given enough medication to  last until your next medication refill appointment. 5. Taking Medication: Take medication as prescribed. Never take more pills than instructed. Never take medication more frequently than prescribed. Taking less pills or less frequently is permitted and encouraged, when it comes to controlled substances (written prescriptions).  6. Inform other Doctors: Always inform, all of your healthcare providers, of all the medications you take. 7. Pain Medication from other Providers: You are not allowed to accept any additional pain medication from any other Doctor or Healthcare provider. There are two exceptions to this rule. (see below) In the event that you require additional pain medication, you are responsible for notifying us, as stated below. 8. Medication Agreement: You are responsible for carefully reading and following our Medication Agreement. This must be signed before receiving any prescriptions from our practice. Safely store a copy of your signed Agreement. Violations to the Agreement will result in no further prescriptions. (Additional copies of our Medication Agreement are available upon request.) 9. Laws, Rules, & Regulations: All patients are expected to follow all Federal and [employer], [employer], Rules, [employer]. Ignorance of the Laws does not constitute a valid excuse. The use of any illegal substances is prohibited. 10. Adopted CDC guidelines & recommendations: Target dosing levels will be at or below 60 MME/day. Use of benzodiazepines** is not recommended.  Exceptions: There are only two exceptions to the rule of not receiving pain medications from other Healthcare Providers. 1. Exception #1 (Emergencies): In the event of an emergency (i.e.: accident requiring emergency care), you are allowed to receive additional pain medication. However, you are responsible for: As soon as you are able, call our office [phone], at any time of the day or night, and leave a message stating your  name, the date and nature of the emergency, and the name and dose of the medication  prescribed. In the event that your call is answered by a member of our staff, make sure to document and save the date, time, and the name of the person that took your information.  2. Exception #2 (Planned Surgery): In the event that you are scheduled by another doctor or dentist to have any type of surgery or procedure, you are allowed (for a period no longer than 30 days), to receive additional pain medication, for the acute post-op pain. However, in this case, you are responsible for picking up a copy of our "Post-op Pain Management for Surgeons" handout, and giving it to your surgeon or dentist. This document is available at our office, and does not require an appointment to obtain it. Simply go to our office during business hours (Monday-Thursday from 8:00 AM to 4:00 PM) (Friday 8:00 AM to 12:00 Noon) or if you have a scheduled appointment with [place], prior to your surgery, and ask for it by name. In addition, you will need to provide [place] with your name, name of your surgeon, type of surgery, and date of procedure or surgery.  *Opioid medications include: morphine, codeine, oxycodone, oxymorphone, hydrocodone, hydromorphone, meperidine, tramadol, tapentadol, buprenorphine, fentanyl, methadone. **Benzodiazepine medications include: diazepam (Valium), alprazolam (Xanax), clonazepam (Klonopine), lorazepam (Ativan), clorazepate (Tranxene), chlordiazepoxide (Librium), estazolam (Prosom), oxazepam (Serax), temazepam (Restoril), triazolam (Halcion)  ____________________________________________________________________________________________ Pain Management Discharge Instructions  General Discharge Instructions :  If you need to reach your doctor call: Monday-Friday 8:00 am - 4:00 pm at [phone] or toll free [phone].  After clinic hours [phone] to have operator reach doctor.  Bring all of your medication bottles  to all your appointments in the pain clinic.  To cancel or reschedule your appointment with Pain Management please remember to call 24 hours in advance to avoid a fee.  Refer to the educational materials which you have been given on: General Risks, I had my Procedure. Discharge Instructions, Post Sedation.  Post Procedure Instructions:  The drugs you were given will stay in your system until tomorrow, so for the next 24 hours you should not drive, make any legal decisions or drink any alcoholic beverages.  You may eat anything you prefer, but it is better to start with liquids then soups and crackers, and gradually work up to solid foods.  Please notify your doctor immediately if you have any unusual bleeding, trouble breathing or pain that is not related to your normal pain.  Depending on the type of procedure that was done, some parts of your body may feel week and/or numb.  This usually clears up by tonight or the next day.  Walk with the use of an assistive device or accompanied by an adult for the 24 hours.  You may use ice on the affected area for the first 24 hours.  Put ice in a Ziploc bag and cover with a towel and place against area 15 minutes on 15 minutes off.  You may switch to heat after 24 hours.

## 2016-09-15 NOTE — Progress Notes (Signed)
Nursing Pain Medication Assessment:  Safety precautions to be maintained throughout the outpatient stay will include: orient to surroundings, keep bed in low position, maintain call bell within reach at all times, provide assistance with transfer out of bed and ambulation.  Medication Inspection Compliance: Pill count conducted under aseptic conditions, in front of the patient. Neither the pills nor the bottle was removed from the patient's sight at any time. Once count was completed pills were immediately returned to the patient in their original bottle.  Medication: Oxycodone IR Pill/Patch Count: 0 of 90 pills remain Pill/Patch Appearance: No markings Bottle Appearance: Standard pharmacy container. Clearly labeled. Filled Date: 04 / 18 / 2018 Last Medication intake:  Ran out of medicine more than 48 hours ago

## 2016-09-15 NOTE — Progress Notes (Deleted)
Nursing Pain Medication Assessment:  Safety precautions to be maintained throughout the outpatient stay will include: orient to surroundings, keep bed in low position, maintain call bell within reach at all times, provide assistance with transfer out of bed and ambulation.  Medication Inspection Compliance: Stephanie Short did not comply with our request to bring her pills to be counted. She was reminded that bringing the medication bottles, even when empty, is a requirement.  Medication: None brought in. Pill/Patch Count: None available to be counted. Bottle Appearance: No container available. Did not bring bottle(s) to appointment. Filled Date: N/A Last Medication intake:  Ran out of medicine more than 48 hours ago

## 2016-10-08 ENCOUNTER — Ambulatory Visit
Admission: RE | Admit: 2016-10-08 | Discharge: 2016-10-08 | Disposition: A | Discharge status: Home / Self Care | Payer: 59 | Source: Ambulatory Visit | Attending: Internal Medicine | Admitting: Internal Medicine

## 2016-10-08 DIAGNOSIS — Z1231 Encounter for screening mammogram for malignant neoplasm of breast: Secondary | ICD-10-CM | POA: Diagnosis not present

## 2016-12-02 IMAGING — CT CT CTA ABD/PEL W/CM AND/OR W/O CM
2 of 6 series · 14 of 46 positions shown, 16 images · IV contrast (APPLIED)
Comparison: CT chest 05/24/2012 and CT abdomen/pelvis 08/29/2009

CLINICAL DATA: Crushing upper abdominal pain radiating to back,
chest and left shoulder 2 days. Weakness with some shortness of
breath and nausea as well as diaphoresis.

EXAM:
CT ANGIOGRAPHY CHEST, ABDOMEN AND PELVIS
TECHNIQUE: Multidetector CT imaging through the chest, abdomen and pelvis was
performed using the standard protocol during bolus administration of
intravenous contrast. Multiplanar reconstructed images and MIPs were
obtained and reviewed to evaluate the vascular anatomy.
CONTRAST:  100mL OMNIPAQUE IOHEXOL 350 MG/ML SOLN

[Series 6: arterial · axial · arterial · 0.68mm/px · z∈[-66,+426]mm · 11 of 292 slices shown, 13 images]
[im 23/292  soft-tissue]
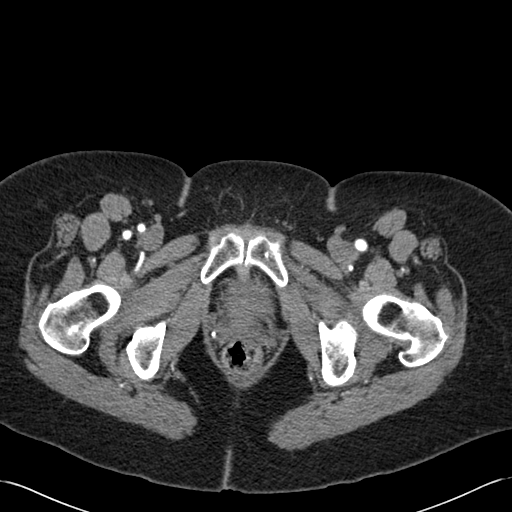
[im 23/292  bone]
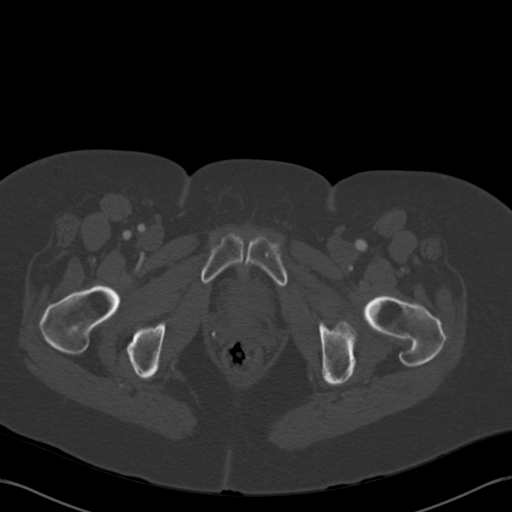
[im 45/292  soft-tissue]
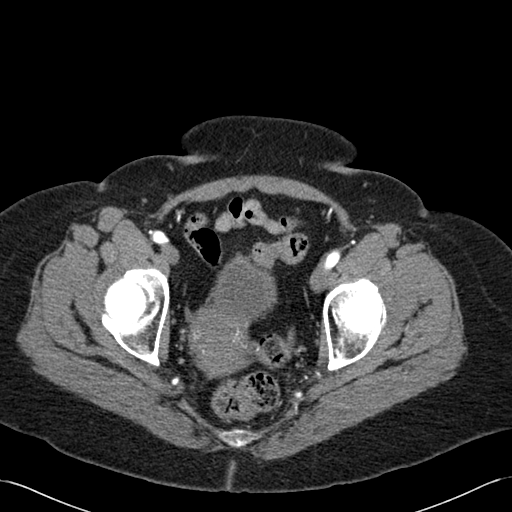
[im 68/292  soft-tissue]
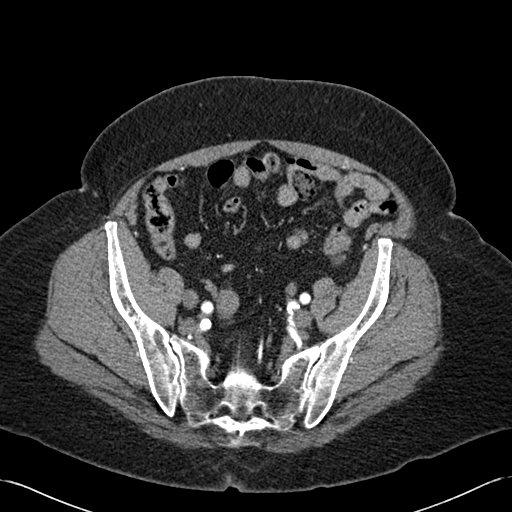
[im 90/292  soft-tissue]
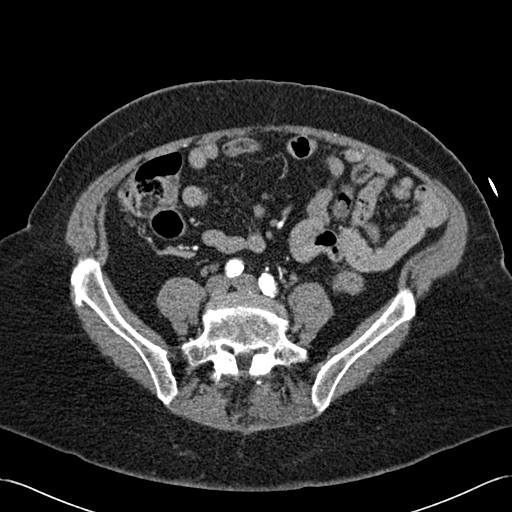
[im 124/292  soft-tissue]
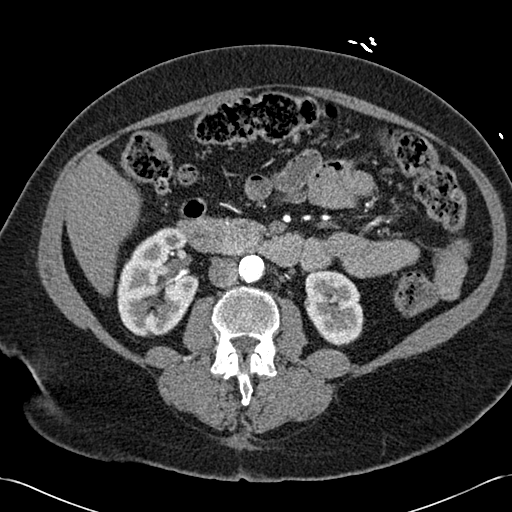
[im 146/292  soft-tissue]
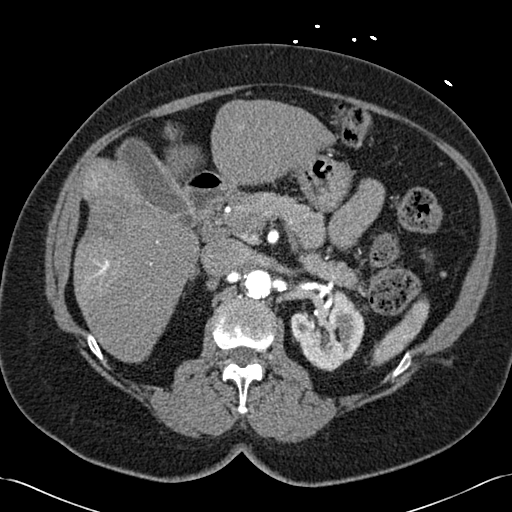
[im 168/292  soft-tissue]
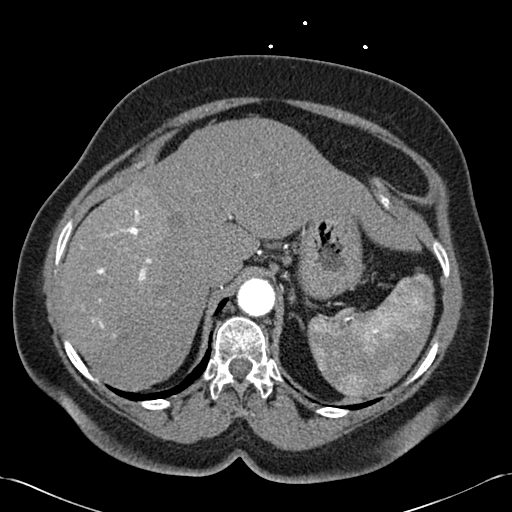
[im 202/292  soft-tissue]
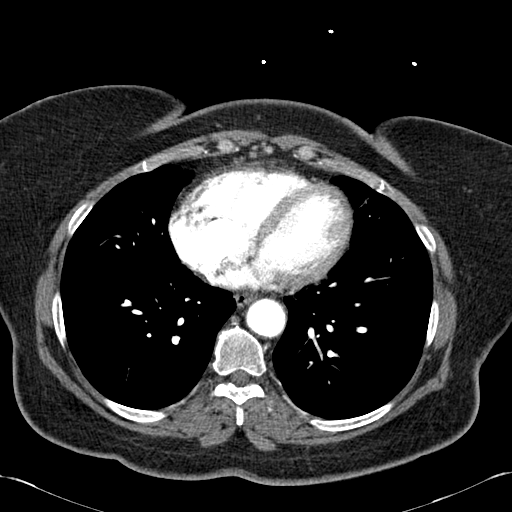
[im 224/292  soft-tissue]
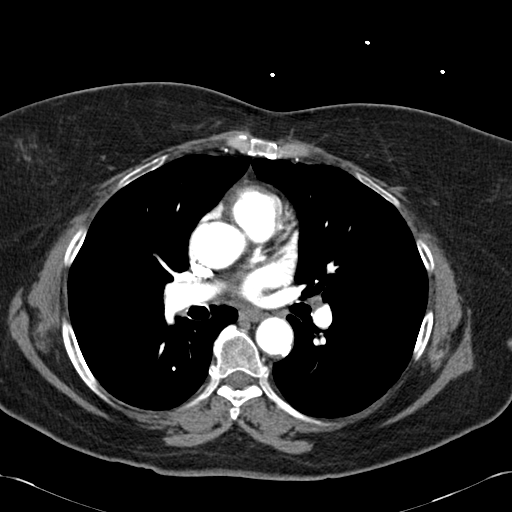
[im 224/292  bone]
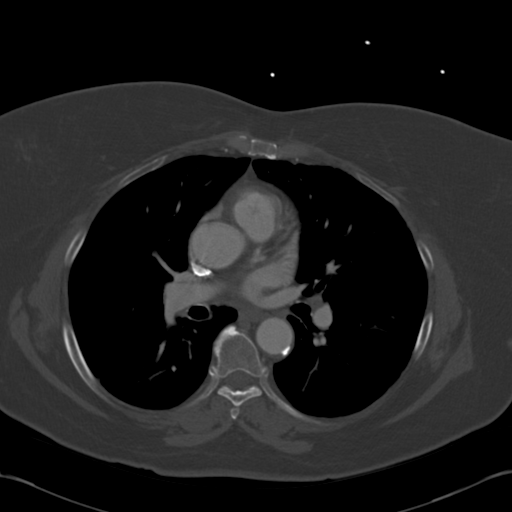
[im 247/292  soft-tissue]
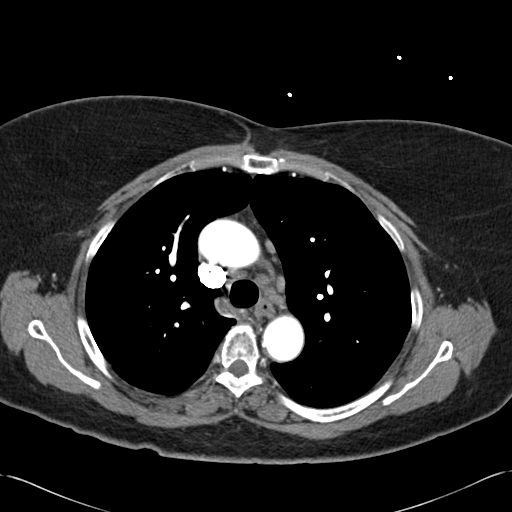
[im 269/292  soft-tissue]
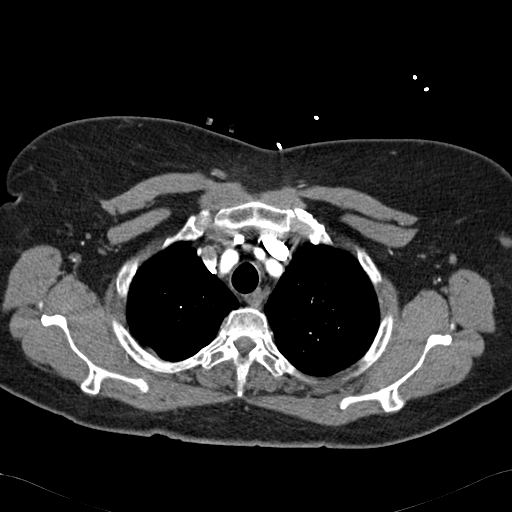

[Series 8: cor arterial mpr · coronal · arterial · 0.75mm/px · 3 of 140 slices shown]
[im 35/140  soft-tissue]
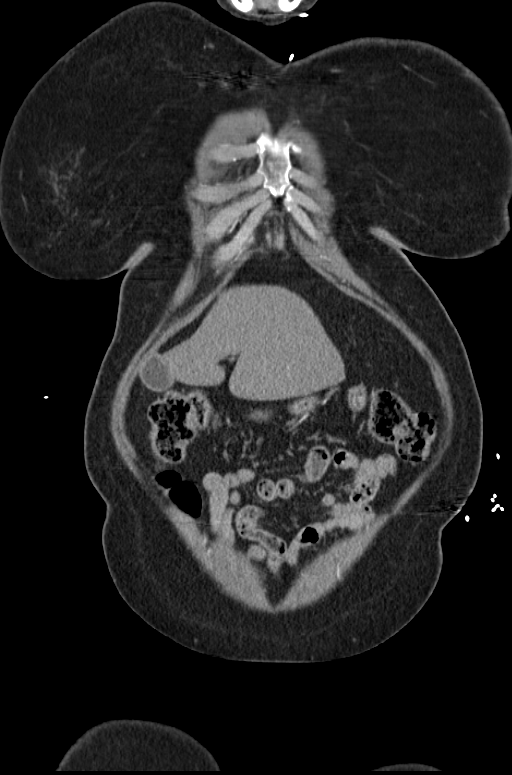
[im 70/140  soft-tissue]
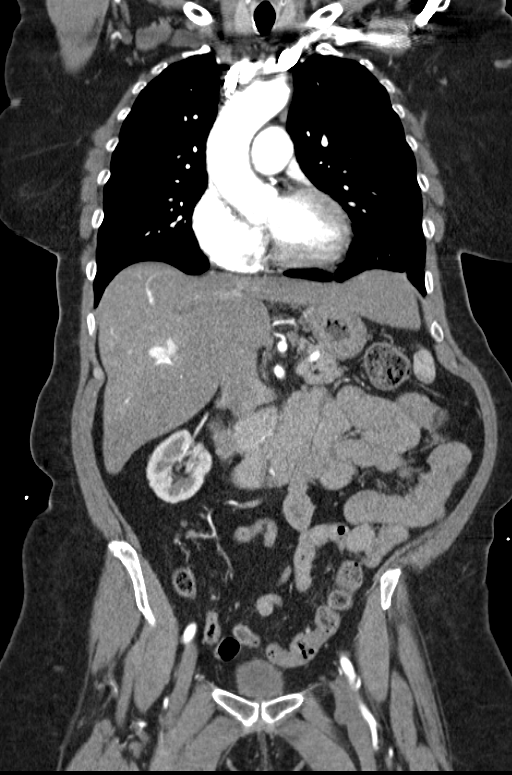
[im 105/140  soft-tissue]
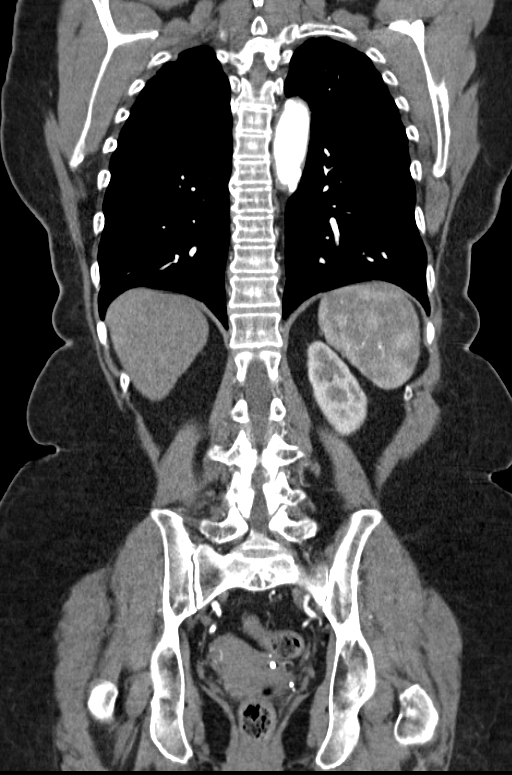

[14 of 46 positions shown; findings below may reference images not displayed]

FINDINGS: CTA CHEST FINDINGS:

The lungs are adequately inflated with mild patchy emphysematous
disease and scarring which is stable. No focal consolidation or
effusion. Airways are within normal.

Heart is normal in size. There is calcified plaque over the left
main and left anterior descending coronary arteries. Pulmonary
arterial system is within normal without evidence of emboli.
Thoracic aorta is normal caliber without evidence of dissection. No
significant mediastinal, hilar or axillary adenopathy. Remaining
mediastinal structures are within normal.

Review of the MIP images confirms the above findings.

CTA ABDOMEN AND PELVIS FINDINGS:

Mild liver low-attenuation. The spleen, pancreas, gallbladder and
adrenal glands are within normal. Stomach is normal. Appendix is
normal.

Kidneys are normal in size without hydronephrosis or
nephrolithiasis. There is a 2 cm hypodensity over the mid pole left
renal cortex with Hounsfield unit measurements 27. There are 2 small
sub cm hypodensities over the right renal cortex likely cyst but too
small to characterize. Ureters are within normal.

Minimal diverticulosis of the colon. Small bowel is within normal.
Mesentery is within normal. There is no free fluid or focal
inflammatory change.

Abdominal aorta is normal caliber without dissection or aneurysm.
There is moderate calcified plaque over the abdominal aorta and
iliac arteries.

Pelvic images demonstrate a normal bladder, uterus, ovaries and
rectum. No free fluid.

There mild degenerate changes of the spine and hips.

Review of the MIP images confirms the above findings.
IMPRESSION: No evidence of aneurysm or dissection involving the thoracoabdominal
aorta.

No acute findings in the chest, abdomen or pelvis.

Mild patchy emphysematous disease and scarring which is stable.

Atherosclerotic coronary artery disease.

2 cm indeterminate hypodensity over the left renal midpole likely a
cyst. Couple sub cm right renal cortical hypodensities too small to
characterize but likely cysts. Recommend followup CT pre and
post-contrast 6 months.

Minimal diverticulosis of the colon.

## 2017-01-29 ENCOUNTER — Encounter: Payer: Self-pay | Admitting: Nurse Practitioner

## 2017-01-29 ENCOUNTER — Other Ambulatory Visit: Payer: Self-pay

## 2017-01-29 ENCOUNTER — Ambulatory Visit: Payer: 59 | Attending: Nurse Practitioner | Admitting: Nurse Practitioner

## 2017-01-29 VITALS — BP 162/81 | HR 107 | Temp 97.9°F | Resp 18 | Ht 64.0 in | Wt 195.0 lb

## 2017-01-29 DIAGNOSIS — E1122 Type 2 diabetes mellitus with diabetic chronic kidney disease: Secondary | ICD-10-CM | POA: Diagnosis not present

## 2017-01-29 DIAGNOSIS — Z79899 Other long term (current) drug therapy: Secondary | ICD-10-CM | POA: Insufficient documentation

## 2017-01-29 DIAGNOSIS — J45909 Unspecified asthma, uncomplicated: Secondary | ICD-10-CM | POA: Diagnosis not present

## 2017-01-29 DIAGNOSIS — Z7984 Long term (current) use of oral hypoglycemic drugs: Secondary | ICD-10-CM | POA: Diagnosis not present

## 2017-01-29 DIAGNOSIS — N186 End stage renal disease: Secondary | ICD-10-CM | POA: Insufficient documentation

## 2017-01-29 DIAGNOSIS — E7800 Pure hypercholesterolemia, unspecified: Secondary | ICD-10-CM | POA: Insufficient documentation

## 2017-01-29 DIAGNOSIS — M25552 Pain in left hip: Secondary | ICD-10-CM | POA: Diagnosis not present

## 2017-01-29 DIAGNOSIS — Z79891 Long term (current) use of opiate analgesic: Secondary | ICD-10-CM | POA: Diagnosis not present

## 2017-01-29 DIAGNOSIS — M545 Low back pain, unspecified: Secondary | ICD-10-CM

## 2017-01-29 DIAGNOSIS — M810 Age-related osteoporosis without current pathological fracture: Secondary | ICD-10-CM | POA: Diagnosis not present

## 2017-01-29 DIAGNOSIS — M533 Sacrococcygeal disorders, not elsewhere classified: Secondary | ICD-10-CM | POA: Diagnosis not present

## 2017-01-29 DIAGNOSIS — E538 Deficiency of other specified B group vitamins: Secondary | ICD-10-CM | POA: Diagnosis not present

## 2017-01-29 DIAGNOSIS — N840 Polyp of corpus uteri: Secondary | ICD-10-CM | POA: Diagnosis not present

## 2017-01-29 DIAGNOSIS — G894 Chronic pain syndrome: Secondary | ICD-10-CM | POA: Insufficient documentation

## 2017-01-29 DIAGNOSIS — Z87891 Personal history of nicotine dependence: Secondary | ICD-10-CM | POA: Diagnosis not present

## 2017-01-29 DIAGNOSIS — I129 Hypertensive chronic kidney disease with stage 1 through stage 4 chronic kidney disease, or unspecified chronic kidney disease: Secondary | ICD-10-CM | POA: Insufficient documentation

## 2017-01-29 DIAGNOSIS — G8929 Other chronic pain: Secondary | ICD-10-CM

## 2017-01-29 DIAGNOSIS — I700 Atherosclerosis of aorta: Secondary | ICD-10-CM | POA: Insufficient documentation

## 2017-01-29 DIAGNOSIS — M47816 Spondylosis without myelopathy or radiculopathy, lumbar region: Secondary | ICD-10-CM

## 2017-01-29 DIAGNOSIS — Z5181 Encounter for therapeutic drug level monitoring: Secondary | ICD-10-CM | POA: Insufficient documentation

## 2017-01-29 DIAGNOSIS — Z7982 Long term (current) use of aspirin: Secondary | ICD-10-CM | POA: Diagnosis not present

## 2017-01-29 DIAGNOSIS — F329 Major depressive disorder, single episode, unspecified: Secondary | ICD-10-CM | POA: Insufficient documentation

## 2017-01-29 DIAGNOSIS — D472 Monoclonal gammopathy: Secondary | ICD-10-CM | POA: Diagnosis not present

## 2017-01-29 DIAGNOSIS — Z885 Allergy status to narcotic agent status: Secondary | ICD-10-CM | POA: Insufficient documentation

## 2017-01-29 MED ORDER — OXYCODONE HCL 5 MG PO TABS: 2.5000 mg | ORAL_TABLET | ORAL | 0 refills | Status: DC | PRN

## 2017-01-29 MED ORDER — ONDANSETRON 4 MG PO TBDP: ORAL_TABLET | ORAL | 0 refills | Status: DC

## 2017-01-29 MED ORDER — CYANOCOBALAMIN 1500 MCG PO TBDP: 1.0000 | ORAL_TABLET | Freq: Every day | ORAL | 2 refills | Status: DC

## 2017-01-29 NOTE — Progress Notes (Signed)
Nursing Pain Medication Assessment:  Safety precautions to be maintained throughout the outpatient stay will include: orient to surroundings, keep bed in low position, maintain call bell within reach at all times, provide assistance with transfer out of bed and ambulation.  Medication Inspection Compliance: Stephanie Short did not comply with our request to bring her pills to be counted. She was reminded that bringing the medication bottles, even when empty, is a requirement.  Medication: None brought in. Pill/Patch Count: None available to be counted. Bottle Appearance: No container available. Did not bring bottle(s) to appointment. Filled Date: N/A Last Medication intake:  Today

## 2017-01-29 NOTE — Progress Notes (Signed)
Patient's Name: Stephanie Short  MRN: [id number]  Referring Provider: Kirk Ruths, MD  DOB: 12-23-1950  PCP: Kirk Ruths, MD  DOS: 01/29/2017  Note by: Vevelyn Francois NP  Service setting: Ambulatory outpatient  Specialty: Interventional Pain Management  Location: [facility] (AMB) Pain Management Facility    Patient type: Established    Primary Reason(s) for Visit: Encounter for prescription drug management. (Level of risk: moderate)  CC: Back Pain (lower)  HPI  [name] is a 67 y.o. year old, female patient, who comes today for a medication management evaluation. She has HYPERLIPIDEMIA-MIXED; AORTIC ATHEROSCLEROSIS; Endometrial polyp; Chronic low back pain (Location of Primary Source of Pain) (Bilateral) (R>L); Lumbar spondylosis; Failed back surgical syndrome; Lumbar facet syndrome (Bilateral) (R>L); Long term current use of opiate analgesic; Long term prescription opiate use; Opiate use (22.5 MME/Day); Encounter for therapeutic drug level monitoring; Encounter for chronic pain management; Airway hyperreactivity; Atherosclerosis of abdominal aorta ([place]); Diabetes mellitus ([place]); Benign essential HTN; Cannot sleep; Major depression in remission ([place]); Burning or prickling sensation; Hemorrhage, postmenopausal; Pure hypercholesterolemia; Osteopenia; Chronic hip pain(2) (Bilateral) (L>R); Greater trochanteric bursitis of hips (Bilateral); Chronic sacroiliac joint pain (Bilateral); Osteoarthritis of hips (Bilateral); Pain management; Chronic pain of lower extremity  (Bilateral) (L>R); Postmenopausal osteoporosis; Chronic cervical radicular pain; Neurogenic pain; Chronic kidney disease; Vitamin B12 deficiency; Problem with medical care compliance; Poor historian; Asthma; Chronic neck pain (3) (B) (R>L); Monoclonal gammopathy; and Chronic pain syndrome on their problem list. Her primarily concern today is the Back Pain (lower)  Pain Assessment: Location: Lower Back Radiating: left  hip Onset: More than a month ago Duration: Chronic pain Quality: Aching, Dull Severity: 8 /10 (self-reported pain score)  Note: Reported level is compatible with observation. Clinically the patient looks like a 2/10 A 2/10 is viewed as "Mild to Moderate" and described as noticeable and distracting. Impossible to hide from other people. More frequent flare-ups. Still possible to adapt and function close to normal. It can be very annoying and may have occasional stronger flare-ups. With discipline, patients may get used to it and adapt. Information on the proper use of the pain scale provided to the patient today. When using our objective Pain Scale, levels between 6 and 10/10 are said to belong in an emergency room, as it progressively worsens from a 6/10, described as severely limiting, requiring emergency care not usually available at an outpatient pain management facility. At a 6/10 level, communication becomes difficult and requires great effort. Assistance to reach the emergency department may be required. Facial flushing and profuse sweating along with potentially dangerous increases in heart rate and blood pressure will be evident. Effect on ADL:   Timing: Constant Modifying factors: standing  [name] was last scheduled for an appointment on 09/15/2016 for medication management. During today's appointment we reviewed Ms. Minassian's chronic pain status, as well as her outpatient medication regimen. She admits that she has been out of the state secondary to assisting her dying cousin. She admits that her pain is worse. She states that it has been over 10 weeks since her last use of medication. She admits that she has been APAP and Motrin. She admits that she has had several deep tissue massages. She admits that she has been using topical agent and heating pad. She admits her left hip pain is worse. She has been diagnosed with bursitis in the past. She denies any falls or injuries.   The patient   reports that she does not use drugs. Her  body mass index is 33.47 kg/m.  Further details on both, my assessment(s), as well as the proposed treatment plan, please see below.  Controlled Substance Pharmacotherapy Assessment REMS (Risk Evaluation and Mitigation Strategy)  Analgesic: Oxycodone 5 mg 0.5 mg 3 times daily (oxycodone 15 mg per day) MME/day: 22.5 mg/day.   Landis Martins, RN  01/29/2017  9:15 AM  Sign at close encounter Nursing Pain Medication Assessment:  Safety precautions to be maintained throughout the outpatient stay will include: orient to surroundings, keep bed in low position, maintain call bell within reach at all times, provide assistance with transfer out of bed and ambulation.  Medication Inspection Compliance: [name] did not comply with our request to bring her pills to be counted. She was reminded that bringing the medication bottles, even when empty, is a requirement.  Medication: None brought in. Pill/Patch Count: None available to be counted. Bottle Appearance: No container available. Did not bring bottle(s) to appointment. Filled Date: N/A Last Medication intake:  Today   Pharmacokinetics: Liberation and absorption (onset of action): WNL Distribution (time to peak effect): WNL Metabolism and excretion (duration of action): WNL         Pharmacodynamics: Desired effects: Analgesia: [name] reports >50% benefit. Functional ability: Patient reports that medication allows her to accomplish basic ADLs Clinically meaningful improvement in function (CMIF): Sustained CMIF goals met Perceived effectiveness: Described as relatively effective, allowing for increase in activities of daily living (ADL) Undesirable effects: Side-effects or Adverse reactions: None reported Monitoring: [place] PMP: Online review of the past 55-monthperiod conducted. Compliant with practice rules and regulations Last UDS on record: Summary  Date Value Ref Range Status  06/12/2016  FINAL  Final    Comment:    ==================================================================== TOXASSURE SELECT 13 (MW) ==================================================================== Test                             Result       Flag       Units Drug Present and Declared for Prescription Verification   Oxycodone                      206          EXPECTED   ng/mg creat   Oxymorphone                    183          EXPECTED   ng/mg creat   Noroxycodone                   293          EXPECTED   ng/mg creat   Noroxymorphone                 69           EXPECTED   ng/mg creat    Sources of oxycodone are scheduled prescription medications.    Oxymorphone, noroxycodone, and noroxymorphone are expected    metabolites of oxycodone. Oxymorphone is also available as a    scheduled prescription medication. Drug Absent but Declared for Prescription Verification   Alprazolam                     Not Detected UNEXPECTED ng/mg creat ==================================================================== Test                      Result  Flag   Units      Ref Range   Creatinine              216              mg/dL      >=20 ==================================================================== Declared Medications:  The flagging and interpretation on this report are based on the  following declared medications.  Unexpected results may arise from  inaccuracies in the declared medications.  **Note: The testing scope of this panel includes these medications:  Alprazolam  Oxycodone  **Note: The testing scope of this panel does not include following  reported medications:  Acetaminophen  Albuterol  Aspirin (Aspirin 81)  Cyanocobalamin  Felodipine  Glimepiride  Ondansetron  Pantoprazole  Ranitidine  Vitamin C  Zolpidem ==================================================================== For clinical consultation, please call [phone]. ====================================================================    UDS interpretation: Compliant          Medication Assessment Form: Reviewed. Patient indicates being compliant with therapy Treatment compliance: Compliant Risk Assessment Profile: Aberrant behavior: See prior evaluations. None observed or detected today Comorbid factors increasing risk of overdose: See prior notes. No additional risks detected today Risk of substance use disorder (SUD): Low  ORT Scoring interpretation table:  Score <3 = Low Risk for SUD  Score between 4-7 = Moderate Risk for SUD  Score >8 = High Risk for Opioid Abuse   Risk Mitigation Strategies:  Patient Counseling: Covered Patient-Prescriber Agreement (PPA): Present and active  Notification to other healthcare providers: Done  Pharmacologic Plan: No change in therapy, at this time.             Laboratory Chemistry  Inflammation Markers (CRP: Acute Phase) (ESR: Chronic Phase) Lab Results  Component Value Date   CRP 0.6 04/25/2015   ESRSEDRATE 7 04/25/2015                 Rheumatology Markers No results found for: Elayne Guerin, [facility]              Renal Function Markers Lab Results  Component Value Date   BUN 17 06/03/2016   CREATININE 0.72 06/03/2016   GFRAA >60 06/03/2016   GFRNONAA >60 06/03/2016                 Hepatic Function Markers Lab Results  Component Value Date   AST 25 12/21/2015   ALT 15 12/21/2015   ALBUMIN 4.3 12/21/2015   ALKPHOS 84 12/21/2015   LIPASE 24 12/01/2014                 Electrolytes Lab Results  Component Value Date   NA 136 06/03/2016   K 4.1 06/03/2016   CL 105 06/03/2016   CALCIUM 9.4 06/03/2016   MG 1.9 04/25/2015                 Neuropathy Markers Lab Results  Component Value Date   VITAMINB12 178 (L) 04/25/2015   HGBA1C (H) 02/06/2009    7.4 (NOTE) The ADA recommends the following therapeutic goal for glycemic control related to Hgb  A1c measurement: Goal of therapy: <6.5 Hgb A1c  Reference: American Diabetes Association: Clinical Practice Recommendations 2010, Diabetes Care, 2010, 33: (Suppl  1).                 Bone Pathology Markers Lab Results  Component Value Date   [id number] 55 04/25/2015   [id number] 55 04/25/2015   [id number] <10 04/25/2015  Coagulation Parameters Lab Results  Component Value Date   INR 1.0 09/16/2012   LABPROT 13.3 09/16/2012   PLT 234 06/03/2016   DDIMER  02/06/2009    0.30        AT THE INHOUSE ESTABLISHED CUTOFF VALUE OF 0.48 ug/mL FEU, THIS ASSAY HAS BEEN DOCUMENTED IN THE LITERATURE TO HAVE A SENSITIVITY AND NEGATIVE PREDICTIVE VALUE OF AT LEAST 98 TO 99%.  THE TEST RESULT SHOULD BE CORRELATED WITH AN ASSESSMENT OF THE CLINICAL PROBABILITY OF DVT / VTE.                 Cardiovascular Markers Lab Results  Component Value Date   CKTOTAL 56 01/10/2013   CKMB < 0.5 (L) 01/10/2013   TROPONINI <0.03 06/03/2016   HGB 13.2 06/03/2016   HCT 39.9 06/03/2016                 CA Markers No results found for: CEA, CA125, LABCA2               Note: Lab results reviewed.  Recent Diagnostic Imaging Results  MM SCREENING BREAST TOMO BILATERAL CLINICAL DATA:  Screening.  EXAM: 2D DIGITAL SCREENING BILATERAL MAMMOGRAM WITH CAD AND ADJUNCT TOMO  COMPARISON:  Previous exam(s).  ACR Breast Density Category b: There are scattered areas of fibroglandular density.  FINDINGS: There are no findings suspicious for malignancy. Images were processed with CAD.  IMPRESSION: No mammographic evidence of malignancy. A result letter of this screening mammogram will be mailed directly to the patient.  RECOMMENDATION: Screening mammogram in one year. (Code:[id number])  BI-RADS CATEGORY  1: Negative.  Electronically Signed   By: Pamelia Hoit M.D.   On: 10/09/2016 08:03  Complexity Note: Imaging results reviewed. Results shared with Ms. Avitabile, using Layman's terms.                          Meds   Current Outpatient Medications:  .  acetaminophen (TYLENOL) 500 MG tablet, Take 1,000-1,500 mg by mouth every 6 (six) hours as needed for mild pain., Disp: , Rfl:  .  albuterol (PROVENTIL HFA;VENTOLIN HFA) 108 (90 Base) MCG/ACT inhaler, Inhale 2 puffs into the lungs every 6 (six) hours as needed for wheezing or shortness of breath., Disp: , Rfl:  .  ALPRAZolam (XANAX) 0.25 MG tablet, Take 0.125-0.25 mg by mouth 2 (two) times daily as needed for anxiety or sleep. , Disp: , Rfl:  .  aspirin EC 81 MG tablet, Take 81 mg by mouth at bedtime., Disp: , Rfl:  .  Blood Glucose Monitoring Suppl (FIFTY50 GLUCOSE METER 2.0) w/Device KIT, Use as directed. Dx E11.22 ONE TOUCH, Disp: , Rfl:  .  glimepiride (AMARYL) 2 MG tablet, Take 2 mg by mouth daily with breakfast., Disp: , Rfl:  .  ondansetron (ZOFRAN ODT) 4 MG disintegrating tablet, Allow 1-2 tablets to dissolve in your mouth every 8 hours as needed for nausea/vomiting, Disp: 30 tablet, Rfl: 0 .  ONE TOUCH ULTRA TEST test strip, USE TO CHECK BLOOD SUGAR 1-2 TIMES DAILY AS DIRECTED. DX E11.22., Disp: , Rfl: 5 .  ranitidine (ZANTAC) 150 MG tablet, TAKE 1 TABLET (150 MG TOTAL) BY MOUTH 2 (TWO) TIMES DAILY. AS NEEDED, Disp: , Rfl: 11 .  valsartan (DIOVAN) 80 MG tablet, Take 80 mg by mouth daily., Disp: , Rfl:  .  Cyanocobalamin (VITAMELTS ENERGY VITAMIN B-12) 1500 MCG TBDP, Take 1 tablet by mouth daily., Disp: 30 tablet, Rfl: 2 .  [  START ON 02/28/2017] oxyCODONE (OXY IR/ROXICODONE) 5 MG immediate release tablet, Take 0.5 tablets (2.5 mg total) by mouth every 4 (four) hours as needed for severe pain., Disp: 90 tablet, Rfl: 0 .  oxyCODONE (OXY IR/ROXICODONE) 5 MG immediate release tablet, Take 0.5 tablets (2.5 mg total) by mouth every 4 (four) hours as needed for severe pain., Disp: 90 tablet, Rfl: 0  ROS  Constitutional: Denies any fever or chills Gastrointestinal: No reported hemesis, hematochezia, vomiting, or acute GI  distress Musculoskeletal: Denies any acute onset joint swelling, redness, loss of ROM, or weakness Neurological: No reported episodes of acute onset apraxia, aphasia, dysarthria, agnosia, amnesia, paralysis, loss of coordination, or loss of consciousness  Allergies  Ms. Yeates is allergic to codeine; dilaudid [hydromorphone hcl]; tramadol; gabapentin; hydrocodone-acetaminophen; hydromorphone; oxycodone-acetaminophen; promethazine; and scallops [shellfish allergy].  PFSH  Drug: Ms. Sturgess  reports that she does not use drugs. Alcohol:  reports that she drinks alcohol. Tobacco:  reports that she quit smoking about 13 years ago. She has a 20.00 pack-year smoking history. she has never used smokeless tobacco. Medical:  has a past medical history of Asthma, Depression, Diabetes mellitus, Hepatitis C, Hypertension, and Insomnia. Surgical: Ms. Faux  has a past surgical history that includes Back surgery (2013); Hysteroscopy w/D&C (N/A, 08/18/2014); Knee arthroscopy (Right); Colonoscopy w/ polypectomy; and Colonoscopy with propofol (N/A, 03/13/2015). Family: family history includes Colon cancer in her other; Coronary artery disease (age of onset: 20) in her brother; Coronary artery disease (age of onset: 86) in her mother; Diabetes in her father and other; Heart attack in her brother; Heart disease in her father; Hypertension in her other.  Constitutional Exam  General appearance: Well nourished, well developed, and well hydrated. In no apparent acute distress Vitals:   01/29/17 0900  BP: (!) 162/81  Pulse: (!) 107  Resp: 18  Temp: 97.9 F (36.6 C)  TempSrc: Oral  SpO2: 97%  Weight: 195 lb (88.5 kg)  Height: _0  (1.626 m)   BMI Assessment: Estimated body mass index is 33.47 kg/m as calculated from the following:   Height as of this encounter: _1  (1.626 m).   Weight as of this encounter: 195 lb (88.5 kg). Psych/Mental status: Alert, oriented x 3 (person, place, & time)       Eyes:  PERLA Respiratory: No evidence of acute respiratory distress  Cervical Spine Area Exam  Skin & Axial Inspection: No masses, redness, edema, swelling, or associated skin lesions Alignment: Symmetrical Functional ROM: Unrestricted ROM      Stability: No instability detected Muscle Tone/Strength: Functionally intact. No obvious neuro-muscular anomalies detected. Sensory (Neurological): Unimpaired Palpation: No palpable anomalies              Upper Extremity (UE) Exam    Side: Right upper extremity  Side: Left upper extremity  Skin & Extremity Inspection: Skin color, temperature, and hair growth are WNL. No peripheral edema or cyanosis. No masses, redness, swelling, asymmetry, or associated skin lesions. No contractures.  Skin & Extremity Inspection: Skin color, temperature, and hair growth are WNL. No peripheral edema or cyanosis. No masses, redness, swelling, asymmetry, or associated skin lesions. No contractures.  Functional ROM: Unrestricted ROM          Functional ROM: Unrestricted ROM          Muscle Tone/Strength: Functionally intact. No obvious neuro-muscular anomalies detected.  Muscle Tone/Strength: Functionally intact. No obvious neuro-muscular anomalies detected.  Sensory (Neurological): Unimpaired          Sensory (  Neurological): Unimpaired          Palpation: No palpable anomalies              Palpation: No palpable anomalies              Specialized Test(s): Deferred         Specialized Test(s): Deferred          Thoracic Spine Area Exam  Skin & Axial Inspection: No masses, redness, or swelling Alignment: Symmetrical Functional ROM: Unrestricted ROM Stability: No instability detected Muscle Tone/Strength: Functionally intact. No obvious neuro-muscular anomalies detected. Sensory (Neurological): Unimpaired Muscle strength & Tone: No palpable anomalies  Lumbar Spine Area Exam  Skin & Axial Inspection: No masses, redness, or swelling Alignment: Symmetrical Functional ROM:  Unrestricted ROM      Stability: No instability detected Muscle Tone/Strength: Functionally intact. No obvious neuro-muscular anomalies detected. Sensory (Neurological): Unimpaired Palpation: Complains of area being tender to palpation       Provocative Tests: Lumbar Hyperextension and rotation test: Positive bilaterally for facet joint pain. Lumbar Lateral bending test: evaluation deferred today       Patrick's Maneuver: evaluation deferred today                    Gait & Posture Assessment  Ambulation: Unassisted Gait: Relatively normal for age and body habitus Posture: WNL   Lower Extremity Exam    Side: Right lower extremity  Side: Left lower extremity  Skin & Extremity Inspection: Skin color, temperature, and hair growth are WNL. No peripheral edema or cyanosis. No masses, redness, swelling, asymmetry, or associated skin lesions. No contractures.  Skin & Extremity Inspection: Skin color, temperature, and hair growth are WNL. No peripheral edema or cyanosis. No masses, redness, swelling, asymmetry, or associated skin lesions. No contractures.  Functional ROM: Unrestricted ROM          Functional ROM: Pain restricted ROM for hip joint  Muscle Tone/Strength: Functionally intact. No obvious neuro-muscular anomalies detected.  Muscle Tone/Strength: Functionally intact. No obvious neuro-muscular anomalies detected.  Sensory (Neurological): Unimpaired  Sensory (Neurological): Unimpaired  Palpation: No palpable anomalies  Palpation: No palpable anomalies   Assessment  Primary Diagnosis & Pertinent Problem List: The primary encounter diagnosis was Chronic low back pain (Location of Primary Source of Pain) (Bilateral) (R>L). Diagnoses of Chronic left hip pain, Chronic sacroiliac joint pain (Bilateral), Lumbar facet syndrome (Bilateral) (R>L), Chronic pain syndrome, Long term current use of opiate analgesic, and Vitamin B12 deficiency were also pertinent to this visit.  Status Diagnosis   Controlled Controlled Controlled 1. Chronic low back pain (Location of Primary Source of Pain) (Bilateral) (R>L)   2. Chronic left hip pain   3. Chronic sacroiliac joint pain (Bilateral)   4. Lumbar facet syndrome (Bilateral) (R>L)   5. Chronic pain syndrome   6. Long term current use of opiate analgesic   7. Vitamin B12 deficiency     Problems updated and reviewed during this visit: No problems updated. Plan of Care  Pharmacotherapy (Medications Ordered): Meds ordered this encounter  Medications  . oxyCODONE (OXY IR/ROXICODONE) 5 MG immediate release tablet    Sig: Take 0.5 tablets (2.5 mg total) by mouth every 4 (four) hours as needed for severe pain.    Dispense:  90 tablet    Refill:  0    Do not place this medication, or any other prescription from our practice, on "Automatic Refill". Patient may have prescription filled one day early if pharmacy is closed  on scheduled refill date. Do not fill until: 02/28/2017 To last until:02/28/2017    Order Specific Question:   Supervising Provider    Answer:   Milinda Pointer [phone]  . oxyCODONE (OXY IR/ROXICODONE) 5 MG immediate release tablet    Sig: Take 0.5 tablets (2.5 mg total) by mouth every 4 (four) hours as needed for severe pain.    Dispense:  90 tablet    Refill:  0    Do not place this medication, or any other prescription from our practice, on "Automatic Refill". Patient may have prescription filled one day early if pharmacy is closed on scheduled refill date. Do not fill until: 01/29/2017 To last until: 02/28/2017    Order Specific Question:   Supervising Provider    Answer:   Milinda Pointer [phone]  . ondansetron (ZOFRAN ODT) 4 MG disintegrating tablet    Sig: Allow 1-2 tablets to dissolve in your mouth every 8 hours as needed for nausea/vomiting    Dispense:  30 tablet    Refill:  0    Order Specific Question:   Supervising Provider    AnswerMilinda Pointer [phone]  . Cyanocobalamin (VITAMELTS ENERGY  VITAMIN B-12) 1500 MCG TBDP    Sig: Take 1 tablet by mouth daily.    Dispense:  30 tablet    Refill:  2    Do not place this medication, or any other prescription from our practice, on "Automatic Refill".    Order Specific Question:   Supervising Provider    Answer:   Milinda Pointer [[id number]]   New Prescriptions   No medications on file   Medications administered today: Vevelyn Pat had no medications administered during this visit. Lab-work, procedure(s), and/or referral(s): Orders Placed This Encounter  Procedures  . ToxASSURE Select 13 (MW), Urine   Imaging and/or referral(s): None  Interventional therapies: Planned, scheduled, and/or pending:  Continue with current regimen.  Not at this time   Considering:  Repeat diagnostic lumbar facet block Possiblebilateral lumbar facet radiofrequency ablation Possible diagnostic bilateral sacroiliac joint block Possible diagnosticbilateral intra-articular hip joint injection    Palliative PRN treatment(s):  For the low back pain,bilateral diagnostic lumbar facet block + diagnostic bilateral sacroiliac joint injection For the hip pain, diagnostic intra-articular hip joint injection    Provider-requested follow-up: Return in about 2 months (around 03/29/2017) for MedMgmt with Me Donella Stade Edison Pace).  Future Appointments  Date Time Provider [place]  03/23/2017  1:30 PM Vevelyn Francois, NP [facility] None   Primary Care Physician: Kirk Ruths, MD Location: [facility] Outpatient Pain Management Facility Note by: Vevelyn Francois NP Date: 01/29/2017; Time: 12:16 PM  Pain Score Disclaimer: We use the NRS-11 scale. This is a self-reported, subjective measurement of pain severity with only modest accuracy. It is used primarily to identify changes within a particular patient. It must be understood that outpatient pain scales are significantly less accurate that those used for research, where they can be applied  under ideal controlled circumstances with minimal exposure to variables. In reality, the score is likely to be a combination of pain intensity and pain affect, where pain affect describes the degree of emotional arousal or changes in action readiness caused by the sensory experience of pain. Factors such as social and work situation, setting, emotional state, anxiety levels, expectation, and prior pain experience may influence pain perception and show large inter-individual differences that may also be affected by time variables.  Patient instructions provided during this appointment: Patient Instructions  ____________________________________________________________________________________________  Appointment Policy Summary  It is our goal and responsibility to provide the medical community with assistance in the evaluation and management of patients with chronic pain. Unfortunately our resources are limited. Because we do not have an unlimited amount of time, or available appointments, we are required to closely monitor and manage their use. The following rules exist to maximize their use:  Patient's responsibilities: 1. Punctuality:  At what time should I arrive? You should be physically present in our office 30 minutes before your scheduled appointment. Your scheduled appointment is with your assigned healthcare provider. However, it takes 5-10 minutes to be "checked-in", and another 15 minutes for the nurses to do the admission. If you arrive to our office at the time you were given for your appointment, you will end up being at least 20-25 minutes late to your appointment with the provider. 2. Tardiness:  What happens if I arrive only a few minutes after my scheduled appointment time? You will need to reschedule your appointment. The cutoff is your appointment time. This is why it is so important that you arrive at least 30 minutes before that appointment. If you have an appointment scheduled  for 10:00 AM and you arrive at 10:01, you will be required to reschedule your appointment.  3. Plan ahead:  Always assume that you will encounter traffic on your way in. Plan for it. If you are dependent on a driver, make sure they understand these rules and the need to arrive early. 4. Other appointments and responsibilities:  Avoid scheduling any other appointments before or after your pain clinic appointments.  5. Be prepared:  Write down everything that you need to discuss with your healthcare provider and give this information to the admitting nurse. Write down the medications that you will need refilled. Bring your pills and bottles (even the empty ones), to all of your appointments, except for those where a procedure is scheduled. 6. No children or pets:  Find someone to take care of them. It is not appropriate to bring them in. 7. Scheduling changes:  We request "advanced notification" of any changes or cancellations. 8. Advanced notification:  Defined as a time period of more than 24 hours prior to the originally scheduled appointment. This allows for the appointment to be offered to other patients. 9. Rescheduling:  When a visit is rescheduled, it will require the cancellation of the original appointment. For this reason they both fall within the category of "Cancellations".  10. Cancellations:  They require advanced notification. Any cancellation less than 24 hours before the  appointment will be recorded as a "No Show". 11. No Show:  Defined as an unkept appointment where the patient failed to notify or declare to the practice their intention or inability to keep the appointment.  Corrective process for repeat offenders:  1. Tardiness: Three (3) episodes of rescheduling due to late arrivals will be recorded as one (1) "No Show". 2. Cancellation or reschedule: Three (3) cancellations or rescheduling will be recorded as one (1) "No Show". 3. "No Shows": Three (3) "No Shows" within a  12 month period will result in discharge from the practice.  ____________________________________________________________________________________________    ____________________________________________________________________________________________  Medication Rules  Applies to: All patients receiving prescriptions (written or electronic).  Pharmacy of record: Pharmacy where electronic prescriptions will be sent. If written prescriptions are taken to a different pharmacy, please inform the nursing staff. The pharmacy listed in the electronic medical record should be the one where you would  like electronic prescriptions to be sent.  Prescription refills: Only during scheduled appointments. Applies to both, written and electronic prescriptions.  NOTE: The following applies primarily to controlled substances (Opioid* Pain Medications).   Patient's responsibilities: 1. Pain Pills: Bring all pain pills to every appointment (except for procedure appointments). 2. Pill Bottles: Bring pills in original pharmacy bottle. Always bring newest bottle. Bring bottle, even if empty. 3. Medication refills: You are responsible for knowing and keeping track of what medications you need refilled. The day before your appointment, write a list of all prescriptions that need to be refilled. Bring that list to your appointment and give it to the admitting nurse. Prescriptions will be written only during appointments. If you forget a medication, it will not be "Called in", "Faxed", or "electronically sent". You will need to get another appointment to get these prescribed. 4. Prescription Accuracy: You are responsible for carefully inspecting your prescriptions before leaving our office. Have the discharge nurse carefully go over each prescription with you, before taking them home. Make sure that your name is accurately spelled, that your address is correct. Check the name and dose of your medication to make sure it is  accurate. Check the number of pills, and the written instructions to make sure they are clear and accurate. Make sure that you are given enough medication to last until your next medication refill appointment. 5. Taking Medication: Take medication as prescribed. Never take more pills than instructed. Never take medication more frequently than prescribed. Taking less pills or less frequently is permitted and encouraged, when it comes to controlled substances (written prescriptions).  6. Inform other Doctors: Always inform, all of your healthcare providers, of all the medications you take. 7. Pain Medication from other Providers: You are not allowed to accept any additional pain medication from any other Doctor or Healthcare provider. There are two exceptions to this rule. (see below) In the event that you require additional pain medication, you are responsible for notifying us, as stated below. 8. Medication Agreement: You are responsible for carefully reading and following our Medication Agreement. This must be signed before receiving any prescriptions from our practice. Safely store a copy of your signed Agreement. Violations to the Agreement will result in no further prescriptions. (Additional copies of our Medication Agreement are available upon request.) 9. Laws, Rules, & Regulations: All patients are expected to follow all Federal and [employer], [employer], Rules, [employer]. Ignorance of the Laws does not constitute a valid excuse. The use of any illegal substances is prohibited. 10. Adopted CDC guidelines & recommendations: Target dosing levels will be at or below 60 MME/day. Use of benzodiazepines** is not recommended.  Exceptions: There are only two exceptions to the rule of not receiving pain medications from other Healthcare Providers. 1. Exception #1 (Emergencies): In the event of an emergency (i.e.: accident requiring emergency care), you are allowed to receive additional pain medication.  However, you are responsible for: As soon as you are able, call our office [phone], at any time of the day or night, and leave a message stating your name, the date and nature of the emergency, and the name and dose of the medication prescribed. In the event that your call is answered by a member of our staff, make sure to document and save the date, time, and the name of the person that took your information.  2. Exception #2 (Planned Surgery): In the event that you are scheduled by another doctor or dentist to have  any type of surgery or procedure, you are allowed (for a period no longer than 30 days), to receive additional pain medication, for the acute post-op pain. However, in this case, you are responsible for picking up a copy of our "Post-op Pain Management for Surgeons" handout, and giving it to your surgeon or dentist. This document is available at our office, and does not require an appointment to obtain it. Simply go to our office during business hours (Monday-Thursday from 8:00 AM to 4:00 PM) (Friday 8:00 AM to 12:00 Noon) or if you have a scheduled appointment with [place], prior to your surgery, and ask for it by name. In addition, you will need to provide [place] with your name, name of your surgeon, type of surgery, and date of procedure or surgery.  *Opioid medications include: morphine, codeine, oxycodone, oxymorphone, hydrocodone, hydromorphone, meperidine, tramadol, tapentadol, buprenorphine, fentanyl, methadone. **Benzodiazepine medications include: diazepam (Valium), alprazolam (Xanax), clonazepam (Klonopine), lorazepam (Ativan), clorazepate (Tranxene), chlordiazepoxide (Librium), estazolam (Prosom), oxazepam (Serax), temazepam (Restoril), triazolam (Halcion)  ____________________________________________________________________________________________

## 2017-01-29 NOTE — Patient Instructions (Signed)
____________________________________________________________________________________________  Appointment Policy Summary  It is our goal and responsibility to provide the medical community with assistance in the evaluation and management of patients with chronic pain. Unfortunately our resources are limited. Because we do not have an unlimited amount of time, or available appointments, we are required to closely monitor and manage their use. The following rules exist to maximize their use:  Patient's responsibilities: 1. Punctuality:  At what time should I arrive? You should be physically present in our office 30 minutes before your scheduled appointment. Your scheduled appointment is with your assigned healthcare provider. However, it takes 5-10 minutes to be "checked-in", and another 15 minutes for the nurses to do the admission. If you arrive to our office at the time you were given for your appointment, you will end up being at least 20-25 minutes late to your appointment with the provider. 2. Tardiness:  What happens if I arrive only a few minutes after my scheduled appointment time? You will need to reschedule your appointment. The cutoff is your appointment time. This is why it is so important that you arrive at least 30 minutes before that appointment. If you have an appointment scheduled for 10:00 AM and you arrive at 10:01, you will be required to reschedule your appointment.  3. Plan ahead:  Always assume that you will encounter traffic on your way in. Plan for it. If you are dependent on a driver, make sure they understand these rules and the need to arrive early. 4. Other appointments and responsibilities:  Avoid scheduling any other appointments before or after your pain clinic appointments.  5. Be prepared:  Write down everything that you need to discuss with your healthcare provider and give this information to the admitting nurse. Write down the medications that you will need  refilled. Bring your pills and bottles (even the empty ones), to all of your appointments, except for those where a procedure is scheduled. 6. No children or pets:  Find someone to take care of them. It is not appropriate to bring them in. 7. Scheduling changes:  We request "advanced notification" of any changes or cancellations. 8. Advanced notification:  Defined as a time period of more than 24 hours prior to the originally scheduled appointment. This allows for the appointment to be offered to other patients. 9. Rescheduling:  When a visit is rescheduled, it will require the cancellation of the original appointment. For this reason they both fall within the category of "Cancellations".  10. Cancellations:  They require advanced notification. Any cancellation less than 24 hours before the  appointment will be recorded as a "No Show". 11. No Show:  Defined as an unkept appointment where the patient failed to notify or declare to the practice their intention or inability to keep the appointment.  Corrective process for repeat offenders:  1. Tardiness: Three (3) episodes of rescheduling due to late arrivals will be recorded as one (1) "No Show". 2. Cancellation or reschedule: Three (3) cancellations or rescheduling will be recorded as one (1) "No Show". 3. "No Shows": Three (3) "No Shows" within a 12 month period will result in discharge from the practice.  ____________________________________________________________________________________________    ____________________________________________________________________________________________  Medication Rules  Applies to: All patients receiving prescriptions (written or electronic).  Pharmacy of record: Pharmacy where electronic prescriptions will be sent. If written prescriptions are taken to a different pharmacy, please inform the nursing staff. The pharmacy listed in the electronic medical record should be the one where you would like  electronic prescriptions to be sent.  Prescription refills: Only during scheduled appointments. Applies to both, written and electronic prescriptions.  NOTE: The following applies primarily to controlled substances (Opioid* Pain Medications).   Patient's responsibilities: 1. Pain Pills: Bring all pain pills to every appointment (except for procedure appointments). 2. Pill Bottles: Bring pills in original pharmacy bottle. Always bring newest bottle. Bring bottle, even if empty. 3. Medication refills: You are responsible for knowing and keeping track of what medications you need refilled. The day before your appointment, write a list of all prescriptions that need to be refilled. Bring that list to your appointment and give it to the admitting nurse. Prescriptions will be written only during appointments. If you forget a medication, it will not be "Called in", "Faxed", or "electronically sent". You will need to get another appointment to get these prescribed. 4. Prescription Accuracy: You are responsible for carefully inspecting your prescriptions before leaving our office. Have the discharge nurse carefully go over each prescription with you, before taking them home. Make sure that your name is accurately spelled, that your address is correct. Check the name and dose of your medication to make sure it is accurate. Check the number of pills, and the written instructions to make sure they are clear and accurate. Make sure that you are given enough medication to last until your next medication refill appointment. 5. Taking Medication: Take medication as prescribed. Never take more pills than instructed. Never take medication more frequently than prescribed. Taking less pills or less frequently is permitted and encouraged, when it comes to controlled substances (written prescriptions).  6. Inform other Doctors: Always inform, all of your healthcare providers, of all the medications you take. 7. Pain  Medication from other Providers: You are not allowed to accept any additional pain medication from any other Doctor or Healthcare provider. There are two exceptions to this rule. (see below) In the event that you require additional pain medication, you are responsible for notifying us, as stated below. 8. Medication Agreement: You are responsible for carefully reading and following our Medication Agreement. This must be signed before receiving any prescriptions from our practice. Safely store a copy of your signed Agreement. Violations to the Agreement will result in no further prescriptions. (Additional copies of our Medication Agreement are available upon request.) 9. Laws, Rules, & Regulations: All patients are expected to follow all Federal and [employer], [employer], Rules, [employer]. Ignorance of the Laws does not constitute a valid excuse. The use of any illegal substances is prohibited. 10. Adopted CDC guidelines & recommendations: Target dosing levels will be at or below 60 MME/day. Use of benzodiazepines** is not recommended.  Exceptions: There are only two exceptions to the rule of not receiving pain medications from other Healthcare Providers. 1. Exception #1 (Emergencies): In the event of an emergency (i.e.: accident requiring emergency care), you are allowed to receive additional pain medication. However, you are responsible for: As soon as you are able, call our office [phone], at any time of the day or night, and leave a message stating your name, the date and nature of the emergency, and the name and dose of the medication prescribed. In the event that your call is answered by a member of our staff, make sure to document and save the date, time, and the name of the person that took your information.  2. Exception #2 (Planned Surgery): In the event that you are scheduled by another doctor or dentist to have any type  of surgery or procedure, you are allowed (for a period no longer than  30 days), to receive additional pain medication, for the acute post-op pain. However, in this case, you are responsible for picking up a copy of our "Post-op Pain Management for Surgeons" handout, and giving it to your surgeon or dentist. This document is available at our office, and does not require an appointment to obtain it. Simply go to our office during business hours (Monday-Thursday from 8:00 AM to 4:00 PM) (Friday 8:00 AM to 12:00 Noon) or if you have a scheduled appointment with [place], prior to your surgery, and ask for it by name. In addition, you will need to provide [place] with your name, name of your surgeon, type of surgery, and date of procedure or surgery.  *Opioid medications include: morphine, codeine, oxycodone, oxymorphone, hydrocodone, hydromorphone, meperidine, tramadol, tapentadol, buprenorphine, fentanyl, methadone. **Benzodiazepine medications include: diazepam (Valium), alprazolam (Xanax), clonazepam (Klonopine), lorazepam (Ativan), clorazepate (Tranxene), chlordiazepoxide (Librium), estazolam (Prosom), oxazepam (Serax), temazepam (Restoril), triazolam (Halcion)  ____________________________________________________________________________________________

## 2017-02-05 LAB — TOXASSURE SELECT 13 (MW), URINE

## 2017-02-05 NOTE — Progress Notes (Signed)
  NO MED NOTE: This forensic urine drug screen (UDS) test was conducted using a state-of-the-art ultra high performance liquid chromatography and mass spectrometry system (UPLC/MS-MS), the most sophisticated and accurate method available. UPLC/MS-MS is 1,000 times more precise and accurate than standard gas chromatography and mass spectrometry (GC/MS). This system can analyze 26 drug categories and 180 drug compounds.  The findings of this UDT were reported as abnormal due to inconsistencies with expected results. An expected substance was not present in the sample. Expectations were based on the medication history provided by the patient at the time of sample collection. Results may suggest one of the following possibilities:    A scheduling issue where the patient was unable to come in before running out of medication.  _

## 2017-03-23 ENCOUNTER — Ambulatory Visit: Payer: 59 | Admitting: Nurse Practitioner

## 2017-03-25 ENCOUNTER — Ambulatory Visit: Payer: 59 | Admitting: Nurse Practitioner

## 2017-03-31 ENCOUNTER — Other Ambulatory Visit: Payer: Self-pay

## 2017-03-31 ENCOUNTER — Ambulatory Visit: Payer: 59 | Attending: Nurse Practitioner | Admitting: Nurse Practitioner

## 2017-03-31 ENCOUNTER — Encounter: Payer: Self-pay | Admitting: Nurse Practitioner

## 2017-03-31 VITALS — BP 128/66 | HR 100 | Temp 97.4°F | Resp 16 | Ht 63.0 in | Wt 189.0 lb

## 2017-03-31 DIAGNOSIS — M47816 Spondylosis without myelopathy or radiculopathy, lumbar region: Secondary | ICD-10-CM | POA: Diagnosis not present

## 2017-03-31 DIAGNOSIS — Z888 Allergy status to other drugs, medicaments and biological substances status: Secondary | ICD-10-CM | POA: Insufficient documentation

## 2017-03-31 DIAGNOSIS — Z79891 Long term (current) use of opiate analgesic: Secondary | ICD-10-CM | POA: Diagnosis not present

## 2017-03-31 DIAGNOSIS — Z91013 Allergy to seafood: Secondary | ICD-10-CM | POA: Insufficient documentation

## 2017-03-31 DIAGNOSIS — Z87891 Personal history of nicotine dependence: Secondary | ICD-10-CM | POA: Diagnosis not present

## 2017-03-31 DIAGNOSIS — M5412 Radiculopathy, cervical region: Secondary | ICD-10-CM | POA: Diagnosis not present

## 2017-03-31 DIAGNOSIS — M25512 Pain in left shoulder: Secondary | ICD-10-CM | POA: Diagnosis not present

## 2017-03-31 DIAGNOSIS — Z7984 Long term (current) use of oral hypoglycemic drugs: Secondary | ICD-10-CM | POA: Diagnosis not present

## 2017-03-31 DIAGNOSIS — M160 Bilateral primary osteoarthritis of hip: Secondary | ICD-10-CM | POA: Insufficient documentation

## 2017-03-31 DIAGNOSIS — M25552 Pain in left hip: Secondary | ICD-10-CM | POA: Insufficient documentation

## 2017-03-31 DIAGNOSIS — Z79899 Other long term (current) drug therapy: Secondary | ICD-10-CM | POA: Diagnosis not present

## 2017-03-31 DIAGNOSIS — Z7982 Long term (current) use of aspirin: Secondary | ICD-10-CM | POA: Diagnosis not present

## 2017-03-31 DIAGNOSIS — Z885 Allergy status to narcotic agent status: Secondary | ICD-10-CM | POA: Diagnosis not present

## 2017-03-31 DIAGNOSIS — G8929 Other chronic pain: Secondary | ICD-10-CM | POA: Insufficient documentation

## 2017-03-31 DIAGNOSIS — G894 Chronic pain syndrome: Secondary | ICD-10-CM | POA: Insufficient documentation

## 2017-03-31 MED ORDER — OXYCODONE HCL 5 MG PO TABS: 2.5000 mg | ORAL_TABLET | ORAL | 0 refills | Status: DC | PRN

## 2017-03-31 MED ORDER — ONDANSETRON 4 MG PO TBDP: ORAL_TABLET | ORAL | 0 refills | Status: DC

## 2017-03-31 NOTE — Patient Instructions (Addendum)
____________________________________________________________________________________________  Medication Rules  Applies to: All patients receiving prescriptions (written or electronic).  Pharmacy of record: Pharmacy where electronic prescriptions will be sent. If written prescriptions are taken to a different pharmacy, please inform the nursing staff. The pharmacy listed in the electronic medical record should be the one where you would like electronic prescriptions to be sent.  Prescription refills: Only during scheduled appointments. Applies to both, written and electronic prescriptions.  NOTE: The following applies primarily to controlled substances (Opioid* Pain Medications).   Patient's responsibilities: 1. Pain Pills: Bring all pain pills to every appointment (except for procedure appointments). 2. Pill Bottles: Bring pills in original pharmacy bottle. Always bring newest bottle. Bring bottle, even if empty. 3. Medication refills: You are responsible for knowing and keeping track of what medications you need refilled. The day before your appointment, write a list of all prescriptions that need to be refilled. Bring that list to your appointment and give it to the admitting nurse. Prescriptions will be written only during appointments. If you forget a medication, it will not be "Called in", "Faxed", or "electronically sent". You will need to get another appointment to get these prescribed. 4. Prescription Accuracy: You are responsible for carefully inspecting your prescriptions before leaving our office. Have the discharge nurse carefully go over each prescription with you, before taking them home. Make sure that your name is accurately spelled, that your address is correct. Check the name and dose of your medication to make sure it is accurate. Check the number of pills, and the written instructions to make sure they are clear and accurate. Make sure that you are given enough medication to last  until your next medication refill appointment. 5. Taking Medication: Take medication as prescribed. Never take more pills than instructed. Never take medication more frequently than prescribed. Taking less pills or less frequently is permitted and encouraged, when it comes to controlled substances (written prescriptions).  6. Inform other Doctors: Always inform, all of your healthcare providers, of all the medications you take. 7. Pain Medication from other Providers: You are not allowed to accept any additional pain medication from any other Doctor or Healthcare provider. There are two exceptions to this rule. (see below) In the event that you require additional pain medication, you are responsible for notifying us, as stated below. 8. Medication Agreement: You are responsible for carefully reading and following our Medication Agreement. This must be signed before receiving any prescriptions from our practice. Safely store a copy of your signed Agreement. Violations to the Agreement will result in no further prescriptions. (Additional copies of our Medication Agreement are available upon request.) 9. Laws, Rules, & Regulations: All patients are expected to follow all Federal and State Laws, Statutes, Rules, & Regulations. Ignorance of the Laws does not constitute a valid excuse. The use of any illegal substances is prohibited. 10. Adopted CDC guidelines & recommendations: Target dosing levels will be at or below 60 MME/day. Use of benzodiazepines** is not recommended.  Exceptions: There are only two exceptions to the rule of not receiving pain medications from other Healthcare Providers. 1. Exception #1 (Emergencies): In the event of an emergency (i.e.: accident requiring emergency care), you are allowed to receive additional pain medication. However, you are responsible for: As soon as you are able, call our office [phone], at any time of the day or night, and leave a message stating your name, the  date and nature of the emergency, and the name and dose of the medication   prescribed. In the event that your call is answered by a member of our staff, make sure to document and save the date, time, and the name of the person that took your information.  2. Exception #2 (Planned Surgery): In the event that you are scheduled by another doctor or dentist to have any type of surgery or procedure, you are allowed (for a period no longer than 30 days), to receive additional pain medication, for the acute post-op pain. However, in this case, you are responsible for picking up a copy of our "Post-op Pain Management for Surgeons" handout, and giving it to your surgeon or dentist. This document is available at our office, and does not require an appointment to obtain it. Simply go to our office during business hours (Monday-Thursday from 8:00 AM to 4:00 PM) (Friday 8:00 AM to 12:00 Noon) or if you have a scheduled appointment with [place], prior to your surgery, and ask for it by name. In addition, you will need to provide [place] with your name, name of your surgeon, type of surgery, and date of procedure or surgery.  *Opioid medications include: morphine, codeine, oxycodone, oxymorphone, hydrocodone, hydromorphone, meperidine, tramadol, tapentadol, buprenorphine, fentanyl, methadone. **Benzodiazepine medications include: diazepam (Valium), alprazolam (Xanax), clonazepam (Klonopine), lorazepam (Ativan), clorazepate (Tranxene), chlordiazepoxide (Librium), estazolam (Prosom), oxazepam (Serax), temazepam (Restoril), triazolam (Halcion) (Last updated: 03/19/2017) ____________________________________________________________________________________________   BMI Assessment: Estimated body mass index is 33.48 kg/m as calculated from the following:   Height as of this encounter: _0  (1.6 m).   Weight as of this encounter: 189 lb (85.7 kg).  BMI interpretation table: BMI level Category Range association with higher incidence  of chronic pain  <18 kg/m2 Underweight   18.5-24.9 kg/m2 Ideal body weight   25-29.9 kg/m2 Overweight Increased incidence by 20%  30-34.9 kg/m2 Obese (Class I) Increased incidence by 68%  35-39.9 kg/m2 Severe obesity (Class II) Increased incidence by 136%  >40 kg/m2 Extreme obesity (Class III) Increased incidence by 254%   BMI Readings from Last 4 Encounters:  03/31/17 33.48 kg/m  01/29/17 33.47 kg/m  09/15/16 32.95 kg/m  06/12/16 32.42 kg/m   Wt Readings from Last 4 Encounters:  03/31/17 189 lb (85.7 kg)  01/29/17 195 lb (88.5 kg)  09/15/16 186 lb (84.4 kg)  06/12/16 183 lb (83 kg)   Trigger Point Injection Trigger points are areas where you have pain. A trigger point injection is a shot given in the trigger point to help relieve pain for a few days to a few months. Common places for trigger points include:  The neck.  The shoulders.  The upper back.  The lower back.  A trigger point injection will not cure long-lasting (chronic) pain permanently. These injections do not always work for every person, but for some people they can help to relieve pain for a few days to a few months. Tell a health care provider about:  Any allergies you have.  All medicines you are taking, including vitamins, herbs, eye drops, creams, and over-the-counter medicines.  Any problems you or family members have had with anesthetic medicines.  Any blood disorders you have.  Any surgeries you have had.  Any medical conditions you have. What are the risks? Generally, this is a safe procedure. However, problems may occur, including:  Infection.  Bleeding.  Allergic reaction to the injected medicine.  Irritation of the skin around the injection site.  What happens before the procedure?  Ask your health care provider about changing or stopping your regular medicines.  This is especially important if you are taking diabetes medicines or blood thinners. What happens during the  procedure?  Your health care provider will feel for trigger points. A marker may be used to circle the area for the injection.  The skin over the trigger point will be washed with a germ-killing (antiseptic) solution.  A thin needle is used for the shot. You may feel pain or a twitching feeling when the needle enters the trigger point.  A numbing solution may be injected into the trigger point. Sometimes a medicine to keep down swelling, redness, and warmth (inflammation) is also injected.  Your health care provider may move the needle around the area where the trigger point is located until the tightness and twitching goes away.  After the injection, your health care provider may put gentle pressure over the injection site.  The injection site will be covered with a bandage (dressing). The procedure may vary among health care providers and hospitals. What happens after the procedure?  The dressing can be taken off in a few hours or as told by your health care provider.  You may feel sore and stiff for 1-2 days. This information is not intended to replace advice given to you by your health care provider. Make sure you discuss any questions you have with your health care provider. Document Released: 12/26/2010 Document Revised: 09/09/2015 Document Reviewed: 06/26/2014 Elsevier Interactive Patient Education  2018 [employer].

## 2017-03-31 NOTE — Progress Notes (Signed)
Nursing Pain Medication Assessment:  Safety precautions to be maintained throughout the outpatient stay will include: orient to surroundings, keep bed in low position, maintain call bell within reach at all times, provide assistance with transfer out of bed and ambulation.  Medication Inspection Compliance: Pill count conducted under aseptic conditions, in front of the patient. Neither the pills nor the bottle was removed from the patient's sight at any time. Once count was completed pills were immediately returned to the patient in their original bottle.  Medication: See above Pill/Patch Count: 4 of 90 pills remain Pill/Patch Appearance: Markings consistent with prescribed medication Bottle Appearance: Standard pharmacy container. Clearly labeled. Filled Date: 2 / 21 / 2019 Last Medication intake:  Today

## 2017-03-31 NOTE — Progress Notes (Signed)
Patient's Name: Stephanie Short  MRN: [id number]  Referring Provider: Kirk Ruths, MD  DOB: Mar 17, 1950  PCP: Kirk Ruths, MD  DOS: 03/31/2017  Note by: Vevelyn Francois NP  Service setting: Ambulatory outpatient  Specialty: Interventional Pain Management  Location: [facility] (AMB) Pain Management Facility    Patient type: Established    Primary Reason(s) for Visit: Encounter for prescription drug management. (Level of risk: moderate)  CC: Back Pain (lower) and Hip Pain (bilateral left worse)  HPI  Stephanie Short is a 67 y.o. year old, female patient, who comes today for a medication management evaluation. She has HYPERLIPIDEMIA-MIXED; AORTIC ATHEROSCLEROSIS; Endometrial polyp; Chronic low back pain (Location of Primary Source of Pain) (Bilateral) (R>L); Lumbar spondylosis; Failed back surgical syndrome; Lumbar facet syndrome (Bilateral) (R>L); Long term current use of opiate analgesic; Long term prescription opiate use; Opiate use (22.5 MME/Day); Encounter for therapeutic drug level monitoring; Encounter for chronic pain management; Airway hyperreactivity; Atherosclerosis of abdominal aorta ([place]); Diabetes mellitus ([place]); Benign essential HTN; Cannot sleep; Major depression in remission ([place]); Burning or prickling sensation; Hemorrhage, postmenopausal; Pure hypercholesterolemia; Osteopenia; Chronic hip pain(2) (Bilateral) (L>R); Greater trochanteric bursitis of hips (Bilateral); Chronic sacroiliac joint pain (Bilateral); Osteoarthritis of hips (Bilateral); Pain management; Chronic pain of lower extremity  (Bilateral) (L>R); Postmenopausal osteoporosis; Chronic cervical radicular pain; Neurogenic pain; Chronic kidney disease; Vitamin B12 deficiency; Problem with medical care compliance; Poor historian; Asthma; Chronic neck pain (3) (B) (R>L); Monoclonal gammopathy; Chronic pain syndrome; and Chronic left shoulder pain on their problem list. Her primarily concern today is the Back Pain (lower) and  Hip Pain (bilateral left worse)  Pain Assessment: Location: Lower, Right, Left Back Radiating: hips bilateralleft worse Duration: Chronic pain Quality: Aching, Constant, Discomfort Severity: 3 /10 (self-reported pain score)  Note: Reported level is compatible with observation.                          Effect on ADL: prolonged standing and walking, bending, twiest, stoop, lift anything heavy Timing: Constant Modifying factors: heat, ice, medications, rest  Stephanie Short was last scheduled for an appointment on 01/29/2017 for medication management. During today's appointment we reviewed Stephanie Short's chronic pain status, as well as her outpatient medication regimen. She admits that she is having increased left hip pain. She denies any radiating pain in the leg. She admits that she was seen in the ER and referred to ortho. She as given an injection into her left shoulder. She admits that was effective.    The patient  reports that she does not use drugs. Her body mass index is 33.48 kg/m.  Further details on both, my assessment(s), as well as the proposed treatment plan, please see below.  Controlled Substance Pharmacotherapy Assessment REMS (Risk Evaluation and Mitigation Strategy)  Analgesic: Oxycodone 2.5 mg 3 times daily (oxycodone 7.5 mg per day) MME/day: 11.25 mg/day.   Ignatius Specking, RN  03/31/2017  2:25 PM  Sign at close encounter Nursing Pain Medication Assessment:  Safety precautions to be maintained throughout the outpatient stay will include: orient to surroundings, keep bed in low position, maintain call bell within reach at all times, provide assistance with transfer out of bed and ambulation.  Medication Inspection Compliance: Pill count conducted under aseptic conditions, in front of the patient. Neither the pills nor the bottle was removed from the patient's sight at any time. Once count was completed pills were immediately returned to the patient in their  original  bottle.  Medication: See above Pill/Patch Count: 4 of 90 pills remain Pill/Patch Appearance: Markings consistent with prescribed medication Bottle Appearance: Standard pharmacy container. Clearly labeled. Filled Date: 2 / 21 / 2019 Last Medication intake:  Today   Pharmacokinetics: Liberation and absorption (onset of action): WNL Distribution (time to peak effect): WNL Metabolism and excretion (duration of action): WNL         Pharmacodynamics: Desired effects: Analgesia: Stephanie Short reports >50% benefit. Functional ability: Patient reports that medication allows her to accomplish basic ADLs Clinically meaningful improvement in function (CMIF): Sustained CMIF goals met Perceived effectiveness: Described as relatively effective, allowing for increase in activities of daily living (ADL) Undesirable effects: Side-effects or Adverse reactions: None reported Monitoring: [place] PMP: Online review of the past 56-monthperiod conducted. Compliant with practice rules and regulations Last UDS on record: Summary  Date Value Ref Range Status  01/29/2017 FINAL  Final    Comment:    ==================================================================== TOXASSURE SELECT 13 (MW) ==================================================================== Test                             Result       Flag       Units Drug Absent but Declared for Prescription Verification   Alprazolam                     Not Detected UNEXPECTED ng/mg creat   Oxycodone                      Not Detected UNEXPECTED ng/mg creat ==================================================================== Test                      Result    Flag   Units      Ref Range   Creatinine              156              mg/dL      >=20 ==================================================================== Declared Medications:  The flagging and interpretation on this report are based on the  following declared medications.  Unexpected results may  arise from  inaccuracies in the declared medications.  **Note: The testing scope of this panel includes these medications:  Alprazolam  Oxycodone (Roxicodone)  **Note: The testing scope of this panel does not include following  reported medications:  Acetaminophen (Tylenol)  Albuterol  Aspirin (Aspirin 81)  Cyanocobalamin  Glimepiride  Ondansetron (Zofran)  Ranitidine (Zantac)  Valsartan (Diovan) ==================================================================== For clinical consultation, please call ([phone] ====================================================================    UDS interpretation: Compliant          Medication Assessment Form: Reviewed. Patient indicates being compliant with therapy Treatment compliance: Compliant Risk Assessment Profile: Aberrant behavior: See prior evaluations. None observed or detected today Comorbid factors increasing risk of overdose: See prior notes. No additional risks detected today Risk of substance use disorder (SUD): Low Opioid Risk Tool - 03/31/17 1423      Family History of Substance Abuse   Alcohol  Negative    Illegal Drugs  Negative    Rx Drugs  Negative      Personal History of Substance Abuse   Alcohol  Negative    Illegal Drugs  Negative    Rx Drugs  Negative      Psychological Disease   Psychological Disease  Negative    Depression  Negative      Total Score  Opioid Risk Tool Scoring  0    Opioid Risk Interpretation  Low Risk      ORT Scoring interpretation table:  Score <3 = Low Risk for SUD  Score between 4-7 = Moderate Risk for SUD  Score >8 = High Risk for Opioid Abuse   Risk Mitigation Strategies:  Patient Counseling: Covered Patient-Prescriber Agreement (PPA): Present and active  Notification to other healthcare providers: Done  Pharmacologic Plan: No change in therapy, at this time.             Laboratory Chemistry  Inflammation Markers (CRP: Acute Phase) (ESR: Chronic Phase) Lab  Results  Component Value Date   CRP 0.6 04/25/2015   ESRSEDRATE 7 04/25/2015                         Rheumatology Markers No results found for: Elayne Guerin, [facility]              Renal Function Markers Lab Results  Component Value Date   BUN 17 06/03/2016   CREATININE 0.72 06/03/2016   GFRAA >60 06/03/2016   GFRNONAA >60 06/03/2016                 Hepatic Function Markers Lab Results  Component Value Date   AST 25 12/21/2015   ALT 15 12/21/2015   ALBUMIN 4.3 12/21/2015   ALKPHOS 84 12/21/2015   LIPASE 24 12/01/2014                 Electrolytes Lab Results  Component Value Date   NA 136 06/03/2016   K 4.1 06/03/2016   CL 105 06/03/2016   CALCIUM 9.4 06/03/2016   MG 1.9 04/25/2015                        Neuropathy Markers Lab Results  Component Value Date   VITAMINB12 178 (L) 04/25/2015   HGBA1C (H) 02/06/2009    7.4 (NOTE) The ADA recommends the following therapeutic goal for glycemic control related to Hgb A1c measurement: Goal of therapy: <6.5 Hgb A1c  Reference: American Diabetes Association: Clinical Practice Recommendations 2010, Diabetes Care, 2010, 33: (Suppl  1).                 Bone Pathology Markers Lab Results  Component Value Date   [id number] 55 04/25/2015   [id number] 55 04/25/2015   [id number] <10 04/25/2015                         Coagulation Parameters Lab Results  Component Value Date   INR 1.0 09/16/2012   LABPROT 13.3 09/16/2012   PLT 234 06/03/2016   DDIMER  02/06/2009    0.30        AT THE INHOUSE ESTABLISHED CUTOFF VALUE OF 0.48 ug/mL FEU, THIS ASSAY HAS BEEN DOCUMENTED IN THE LITERATURE TO HAVE A SENSITIVITY AND NEGATIVE PREDICTIVE VALUE OF AT LEAST 98 TO 99%.  THE TEST RESULT SHOULD BE CORRELATED WITH AN ASSESSMENT OF THE CLINICAL PROBABILITY OF DVT / VTE.                 Cardiovascular Markers Lab Results  Component Value Date   CKTOTAL 56 01/10/2013   CKMB < 0.5 (L) 01/10/2013    TROPONINI <0.03 06/03/2016   HGB 13.2 06/03/2016   HCT 39.9 06/03/2016  CA Markers No results found for: CEA, CA125, LABCA2               Note: Lab results reviewed.  Recent Diagnostic Imaging Results  MM SCREENING BREAST TOMO BILATERAL CLINICAL DATA:  Screening.  EXAM: 2D DIGITAL SCREENING BILATERAL MAMMOGRAM WITH CAD AND ADJUNCT TOMO  COMPARISON:  Previous exam(s).  ACR Breast Density Category b: There are scattered areas of fibroglandular density.  FINDINGS: There are no findings suspicious for malignancy. Images were processed with CAD.  IMPRESSION: No mammographic evidence of malignancy. A result letter of this screening mammogram will be mailed directly to the patient.  RECOMMENDATION: Screening mammogram in one year. (Code:[id number])  BI-RADS CATEGORY  1: Negative.  Electronically Signed   By: Pamelia Hoit M.D.   On: 10/09/2016 08:03  Complexity Note: Imaging results reviewed. Results shared with Ms. Weltman, using Layman's terms.                         Meds   Current Outpatient Medications:  .  acetaminophen (TYLENOL) 500 MG tablet, Take 1,000-1,500 mg by mouth every 6 (six) hours as needed for mild pain., Disp: , Rfl:  .  albuterol (PROVENTIL HFA;VENTOLIN HFA) 108 (90 Base) MCG/ACT inhaler, Inhale 2 puffs into the lungs every 6 (six) hours as needed for wheezing or shortness of breath., Disp: , Rfl:  .  ALPRAZolam (XANAX) 0.25 MG tablet, Take 0.125-0.25 mg by mouth 2 (two) times daily as needed for anxiety or sleep. , Disp: , Rfl:  .  aspirin EC 81 MG tablet, Take 81 mg by mouth at bedtime., Disp: , Rfl:  .  Blood Glucose Monitoring Suppl (FIFTY50 GLUCOSE METER 2.0) w/Device KIT, Use as directed. Dx E11.22 ONE TOUCH, Disp: , Rfl:  .  Cyanocobalamin (VITAMELTS ENERGY VITAMIN B-12) 1500 MCG TBDP, Take 1 tablet by mouth daily., Disp: 30 tablet, Rfl: 2 .  glimepiride (AMARYL) 2 MG tablet, Take 2 mg by mouth daily with breakfast., Disp: , Rfl:  .   ondansetron (ZOFRAN ODT) 4 MG disintegrating tablet, Allow 1-2 tablets to dissolve in your mouth every 8 hours as needed for nausea/vomiting, Disp: 30 tablet, Rfl: 0 .  ranitidine (ZANTAC) 150 MG tablet, TAKE 1 TABLET (150 MG TOTAL) BY MOUTH 2 (TWO) TIMES DAILY. AS NEEDED, Disp: , Rfl: 11 .  valsartan (DIOVAN) 80 MG tablet, Take 80 mg by mouth daily., Disp: , Rfl:  .  ONE TOUCH ULTRA TEST test strip, USE TO CHECK BLOOD SUGAR 1-2 TIMES DAILY AS DIRECTED. DX E11.22., Disp: , Rfl: 5 .  [START ON 05/11/2017] oxyCODONE (OXY IR/ROXICODONE) 5 MG immediate release tablet, Take 0.5 tablets (2.5 mg total) by mouth every 4 (four) hours as needed for severe pain., Disp: 90 tablet, Rfl: 0 .  [START ON 04/11/2017] oxyCODONE (OXY IR/ROXICODONE) 5 MG immediate release tablet, Take 0.5 tablets (2.5 mg total) by mouth every 4 (four) hours as needed for severe pain., Disp: 90 tablet, Rfl: 0  ROS  Constitutional: Denies any fever or chills Gastrointestinal: No reported hemesis, hematochezia, vomiting, or acute GI distress Musculoskeletal: Denies any acute onset joint swelling, redness, loss of ROM, or weakness Neurological: No reported episodes of acute onset apraxia, aphasia, dysarthria, agnosia, amnesia, paralysis, loss of coordination, or loss of consciousness  Allergies  Ms. Steinke is allergic to codeine; dilaudid [hydromorphone hcl]; tramadol; gabapentin; hydrocodone-acetaminophen; hydromorphone; oxycodone-acetaminophen; promethazine; and scallops [shellfish allergy].  PFSH  Drug: Ms. Reyburn  reports that she does not use  drugs. Alcohol:  reports that she drinks alcohol. Tobacco:  reports that she quit smoking about 13 years ago. She has a 20.00 pack-year smoking history. she has never used smokeless tobacco. Medical:  has a past medical history of Asthma, Depression, Diabetes mellitus, Hepatitis C, Hypertension, and Insomnia. Surgical: Ms. Cisar  has a past surgical history that includes Back surgery (2013);  Hysteroscopy w/D&C (N/A, 08/18/2014); Knee arthroscopy (Right); Colonoscopy w/ polypectomy; and Colonoscopy with propofol (N/A, 03/13/2015). Family: family history includes Colon cancer in her other; Coronary artery disease (age of onset: 66) in her brother; Coronary artery disease (age of onset: 74) in her mother; Diabetes in her father and other; Heart attack in her brother; Heart disease in her father; Hypertension in her other.  Constitutional Exam  General appearance: Well nourished, well developed, and well hydrated. In no apparent acute distress Vitals:   03/31/17 1414  BP: 128/66  Pulse: 100  Resp: 16  Temp: (!) 97.4 F (36.3 C)  SpO2: 96%  Weight: 189 lb (85.7 kg)  Height: _0  (1.6 m)  Psych/Mental status: Alert, oriented x 3 (person, place, & time)       Eyes: PERLA Respiratory: No evidence of acute respiratory distress  Upper Extremity (UE) Exam    Side: Right upper extremity  Side: Left upper extremity  Skin & Extremity Inspection: Skin color, temperature, and hair growth are WNL. No peripheral edema or cyanosis. No masses, redness, swelling, asymmetry, or associated skin lesions. No contractures.  Skin & Extremity Inspection: Skin color, temperature, and hair growth are WNL. No peripheral edema or cyanosis. No masses, redness, swelling, asymmetry, or associated skin lesions. No contractures.  Functional ROM: Unrestricted ROM          Functional ROM: Adequate ROM          Muscle Tone/Strength: Functionally intact. No obvious neuro-muscular anomalies detected.  Muscle Tone/Strength: Functionally intact. No obvious neuro-muscular anomalies detected.  Sensory (Neurological): Unimpaired          Sensory (Neurological): Unimpaired          Palpation: No palpable anomalies              Palpation: No palpable anomalies              Specialized Test(s): Deferred         Specialized Test(s): Deferred          Thoracic Spine Area Exam  Skin & Axial Inspection: No masses, redness, or  swelling Alignment: Symmetrical Functional ROM: Unrestricted ROM Stability: No instability detected Muscle Tone/Strength: Functionally intact. No obvious neuro-muscular anomalies detected. Sensory (Neurological): Unimpaired Muscle strength & Tone: No palpable anomalies  Lumbar Spine Area Exam  Skin & Axial Inspection: No masses, redness, or swelling Alignment: Symmetrical Functional ROM: Unrestricted ROM      Stability: No instability detected Muscle Tone/Strength: Functionally intact. No obvious neuro-muscular anomalies detected. Sensory (Neurological): Unimpaired Palpation: Complains of area being tender to palpation       Provocative Tests: Lumbar Hyperextension and rotation test: evaluation deferred today       Lumbar Lateral bending test: evaluation deferred today       Patrick's Maneuver: Positive for left-sided S-I arthralgia              Gait & Posture Assessment  Ambulation: Unassisted Gait: Relatively normal for age and body habitus Posture: WNL   Lower Extremity Exam    Side: Right lower extremity  Side: Left lower extremity  Skin & Extremity Inspection:  Skin color, temperature, and hair growth are WNL. No peripheral edema or cyanosis. No masses, redness, swelling, asymmetry, or associated skin lesions. No contractures.  Skin & Extremity Inspection: Skin color, temperature, and hair growth are WNL. No peripheral edema or cyanosis. No masses, redness, swelling, asymmetry, or associated skin lesions. No contractures.  Functional ROM: Unrestricted ROM          Functional ROM: Unrestricted ROM          Muscle Tone/Strength: Functionally intact. No obvious neuro-muscular anomalies detected.  Muscle Tone/Strength: Functionally intact. No obvious neuro-muscular anomalies detected.  Sensory (Neurological): Unimpaired  Sensory (Neurological): Unimpaired  Palpation: No palpable anomalies  Palpation: No palpable anomalies   Assessment  Primary Diagnosis & Pertinent Problem  List: The primary encounter diagnosis was Lumbar spondylosis. Diagnoses of Lumbar facet syndrome (Bilateral) (R>L), Chronic left hip pain, Primary osteoarthritis of both hips, Chronic pain syndrome, Chronic cervical radicular pain, Chronic left shoulder pain, and Long term current use of opiate analgesic were also pertinent to this visit.  Status Diagnosis  Controlled Controlled Controlled 1. Lumbar spondylosis   2. Lumbar facet syndrome (Bilateral) (R>L)   3. Chronic left hip pain   4. Primary osteoarthritis of both hips   5. Chronic pain syndrome   6. Chronic cervical radicular pain   7. Chronic left shoulder pain   8. Long term current use of opiate analgesic     Problems updated and reviewed during this visit: Problem  Chronic Left Shoulder Pain   Plan of Care  Pharmacotherapy (Medications Ordered): Meds ordered this encounter  Medications  . oxyCODONE (OXY IR/ROXICODONE) 5 MG immediate release tablet    Sig: Take 0.5 tablets (2.5 mg total) by mouth every 4 (four) hours as needed for severe pain.    Dispense:  90 tablet    Refill:  0    Do not place this medication, or any other prescription from our practice, on "Automatic Refill". Patient may have prescription filled one day early if pharmacy is closed on scheduled refill date. Do not fill until: 05/11/2017 To last until:06/10/2017    Order Specific Question:   Supervising Provider    Answer:   Milinda Pointer [phone]  . oxyCODONE (OXY IR/ROXICODONE) 5 MG immediate release tablet    Sig: Take 0.5 tablets (2.5 mg total) by mouth every 4 (four) hours as needed for severe pain.    Dispense:  90 tablet    Refill:  0    Do not place this medication, or any other prescription from our practice, on "Automatic Refill". Patient may have prescription filled one day early if pharmacy is closed on scheduled refill date. Do not fill until: 04/11/2017 To last until: 05/11/2017    Order Specific Question:   Supervising Provider     Answer:   Milinda Pointer [phone]  . ondansetron (ZOFRAN ODT) 4 MG disintegrating tablet    Sig: Allow 1-2 tablets to dissolve in your mouth every 8 hours as needed for nausea/vomiting    Dispense:  30 tablet    Refill:  0    Order Specific Question:   Supervising Provider    Answer:   Milinda Pointer [phone]   New Prescriptions   No medications on file   Medications administered today: Vevelyn Pat had no medications administered during this visit. Lab-work, procedure(s), and/or referral(s): Orders Placed This Encounter  Procedures  . SHOULDER INJECTION  . DG HIP UNILAT W OR W/O PELVIS 2-3 VIEWS LEFT  . ToxASSURE Select 13 (MW),  Urine   Imaging and/or referral(s): DG HIP UNILAT W OR W/O PELVIS 2-3 VIEWS LEFT  Interventional therapies: Planned, scheduled, and/or pending:  Continue with current regimen.  Discussed RFA and benefits declined secondary to fear Discussed cervical epidural steroid injection for radicular shoulder pain; not interested at this time   Considering:  Repeat diagnostic lumbar facet block Possiblebilateral lumbar facet radiofrequency ablation Possible diagnostic bilateral sacroiliac joint block Possible diagnosticbilateral intra-articular hip joint injection    Palliative PRN treatment(s):  For the low back pain,bilateral diagnostic lumbar facet block + diagnostic bilateral sacroiliac joint injection For the hip pain, diagnostic intra-articular hip joint injection     Provider-requested follow-up: Return in about 2 months (around 05/31/2017) for MedMgmt with Me Donella Stade [facility]).  No future appointments. Primary Care Physician: Kirk Ruths, MD Location: [facility] Note by: Vevelyn Francois NP Date: 03/31/2017; Time: 3:50 PM  Pain Score Disclaimer: We use the NRS-11 scale. This is a self-reported, subjective measurement of pain severity with only modest accuracy. It is used primarily to  identify changes within a particular patient. It must be understood that outpatient pain scales are significantly less accurate that those used for research, where they can be applied under ideal controlled circumstances with minimal exposure to variables. In reality, the score is likely to be a combination of pain intensity and pain affect, where pain affect describes the degree of emotional arousal or changes in action readiness caused by the sensory experience of pain. Factors such as social and work situation, setting, emotional state, anxiety levels, expectation, and prior pain experience may influence pain perception and show large inter-individual differences that may also be affected by time variables.  Patient instructions provided during this appointment: Patient Instructions   ____________________________________________________________________________________________  Medication Rules  Applies to: All patients receiving prescriptions (written or electronic).  Pharmacy of record: Pharmacy where electronic prescriptions will be sent. If written prescriptions are taken to a different pharmacy, please inform the nursing staff. The pharmacy listed in the electronic medical record should be the one where you would like electronic prescriptions to be sent.  Prescription refills: Only during scheduled appointments. Applies to both, written and electronic prescriptions.  NOTE: The following applies primarily to controlled substances (Opioid* Pain Medications).   Patient's responsibilities: 1. Pain Pills: Bring all pain pills to every appointment (except for procedure appointments). 2. Pill Bottles: Bring pills in original pharmacy bottle. Always bring newest bottle. Bring bottle, even if empty. 3. Medication refills: You are responsible for knowing and keeping track of what medications you need refilled. The day before your appointment, write a list of all prescriptions that need to be  refilled. Bring that list to your appointment and give it to the admitting nurse. Prescriptions will be written only during appointments. If you forget a medication, it will not be "Called in", "Faxed", or "electronically sent". You will need to get another appointment to get these prescribed. 4. Prescription Accuracy: You are responsible for carefully inspecting your prescriptions before leaving our office. Have the discharge nurse carefully go over each prescription with you, before taking them home. Make sure that your name is accurately spelled, that your address is correct. Check the name and dose of your medication to make sure it is accurate. Check the number of pills, and the written instructions to make sure they are clear and accurate. Make sure that you are given enough medication to last until your next medication refill appointment. 5. Taking Medication: Take medication as  prescribed. Never take more pills than instructed. Never take medication more frequently than prescribed. Taking less pills or less frequently is permitted and encouraged, when it comes to controlled substances (written prescriptions).  6. Inform other Doctors: Always inform, all of your healthcare providers, of all the medications you take. 7. Pain Medication from other Providers: You are not allowed to accept any additional pain medication from any other Doctor or Healthcare provider. There are two exceptions to this rule. (see below) In the event that you require additional pain medication, you are responsible for notifying us, as stated below. 8. Medication Agreement: You are responsible for carefully reading and following our Medication Agreement. This must be signed before receiving any prescriptions from our practice. Safely store a copy of your signed Agreement. Violations to the Agreement will result in no further prescriptions. (Additional copies of our Medication Agreement are available upon request.) 9. Laws, Rules,  & Regulations: All patients are expected to follow all Federal and [employer], [employer], Rules, [employer] [facility]. Ignorance of the Laws does not constitute a valid excuse. The use of any illegal substances is prohibited. 10. Adopted CDC guidelines & recommendations: Target dosing levels will be at or below 60 MME/day. Use of benzodiazepines** is not recommended.  Exceptions: There are only two exceptions to the rule of not receiving pain medications from other Healthcare Providers. 1. Exception #1 (Emergencies): In the event of an emergency (i.e.: accident requiring emergency care), you are allowed to receive additional pain medication. However, you are responsible for: As soon as you are able, call our office [phone], at any time of the day or night, and leave a message stating your name, the date and nature of the emergency, and the name and dose of the medication prescribed. In the event that your call is answered by a member of our staff, make sure to document and save the date, time, and the name of the person that took your information.  2. Exception #2 (Planned Surgery): In the event that you are scheduled by another doctor or dentist to have any type of surgery or procedure, you are allowed (for a period no longer than 30 days), to receive additional pain medication, for the acute post-op pain. However, in this case, you are responsible for picking up a copy of our "Post-op Pain Management for Surgeons" handout, and giving it to your surgeon or dentist. This document is available at our office, and does not require an appointment to obtain it. Simply go to our office during business hours (Monday-Thursday from 8:00 AM to 4:00 PM) (Friday 8:00 AM to 12:00 Noon) or if you have a scheduled appointment with [place], prior to your surgery, and ask for it by name. In addition, you will need to provide [place] with your name, name of your surgeon, type of surgery, and date of procedure or surgery.  *Opioid  medications include: morphine, codeine, oxycodone, oxymorphone, hydrocodone, hydromorphone, meperidine, tramadol, tapentadol, buprenorphine, fentanyl, methadone. **Benzodiazepine medications include: diazepam (Valium), alprazolam (Xanax), clonazepam (Klonopine), lorazepam (Ativan), clorazepate (Tranxene), chlordiazepoxide (Librium), estazolam (Prosom), oxazepam (Serax), temazepam (Restoril), triazolam (Halcion) (Last updated: 03/19/2017) ____________________________________________________________________________________________   BMI Assessment: Estimated body mass index is 33.48 kg/m as calculated from the following:   Height as of this encounter: 5' 3" (1.6 m).   Weight as of this encounter: 189 lb (85.7 kg).  BMI interpretation table: BMI level Category Range association with higher incidence of chronic pain  <18 kg/m2 Underweight   18.5-24.9 kg/m2 Ideal body weight  25-29.9 kg/m2 Overweight Increased incidence by 20%  30-34.9 kg/m2 Obese (Class I) Increased incidence by 68%  35-39.9 kg/m2 Severe obesity (Class II) Increased incidence by 136%  >40 kg/m2 Extreme obesity (Class III) Increased incidence by 254%   BMI Readings from Last 4 Encounters:  03/31/17 33.48 kg/m  01/29/17 33.47 kg/m  09/15/16 32.95 kg/m  06/12/16 32.42 kg/m   Wt Readings from Last 4 Encounters:  03/31/17 189 lb (85.7 kg)  01/29/17 195 lb (88.5 kg)  09/15/16 186 lb (84.4 kg)  06/12/16 183 lb (83 kg)   Trigger Point Injection Trigger points are areas where you have pain. A trigger point injection is a shot given in the trigger point to help relieve pain for a few days to a few months. Common places for trigger points include:  The neck.  The shoulders.  The upper back.  The lower back.  A trigger point injection will not cure long-lasting (chronic) pain permanently. These injections do not always work for every person, but for some people they can help to relieve pain for a few days to a few  months. Tell a health care provider about:  Any allergies you have.  All medicines you are taking, including vitamins, herbs, eye drops, creams, and over-the-counter medicines.  Any problems you or family members have had with anesthetic medicines.  Any blood disorders you have.  Any surgeries you have had.  Any medical conditions you have. What are the risks? Generally, this is a safe procedure. However, problems may occur, including:  Infection.  Bleeding.  Allergic reaction to the injected medicine.  Irritation of the skin around the injection site.  What happens before the procedure?  Ask your health care provider about changing or stopping your regular medicines. This is especially important if you are taking diabetes medicines or blood thinners. What happens during the procedure?  Your health care provider will feel for trigger points. A marker may be used to circle the area for the injection.  The skin over the trigger point will be washed with a germ-killing (antiseptic) solution.  A thin needle is used for the shot. You may feel pain or a twitching feeling when the needle enters the trigger point.  A numbing solution may be injected into the trigger point. Sometimes a medicine to keep down swelling, redness, and warmth (inflammation) is also injected.  Your health care provider may move the needle around the area where the trigger point is located until the tightness and twitching goes away.  After the injection, your health care provider may put gentle pressure over the injection site.  The injection site will be covered with a bandage (dressing). The procedure may vary among health care providers and hospitals. What happens after the procedure?  The dressing can be taken off in a few hours or as told by your health care provider.  You may feel sore and stiff for 1-2 days. This information is not intended to replace advice given to you by your health care  provider. Make sure you discuss any questions you have with your health care provider. Document Released: 12/26/2010 Document Revised: 09/09/2015 Document Reviewed: 06/26/2014 Elsevier Interactive Patient Education  2018 [employer].

## 2017-04-04 LAB — TOXASSURE SELECT 13 (MW), URINE

## 2017-05-04 ENCOUNTER — Other Ambulatory Visit: Payer: Self-pay | Admitting: Nurse Practitioner

## 2017-05-07 ENCOUNTER — Telehealth: Payer: Self-pay | Admitting: Pain Medicine

## 2017-05-07 NOTE — Telephone Encounter (Signed)
Pharmacy called stating they cannot get 1500 mg Vitamin D any longer and can they switch to different strength?

## 2017-05-07 NOTE — Telephone Encounter (Signed)
Called to pharmacy to clarify what strength Vitamin D they do have.  Actually, they were calling re; vitamin B12 and they can no longer get the 1500 mcg strength.  They can get 500 mcg, 1000 mcg or 2000 mcg.  If we combine the 500 and 1000 they will need additional Rx for 500 mcg and 1000 mcg.

## 2017-05-12 ENCOUNTER — Other Ambulatory Visit: Payer: Self-pay | Admitting: Nurse Practitioner

## 2017-05-12 MED ORDER — VITAMIN B-12 1000 MCG PO TABS: 1000.0000 ug | ORAL_TABLET | Freq: Every day | ORAL | 1 refills | Status: AC

## 2017-06-24 ENCOUNTER — Encounter: Payer: 59 | Admitting: Nurse Practitioner

## 2017-07-17 ENCOUNTER — Ambulatory Visit: Payer: 59 | Attending: Internal Medicine

## 2017-07-17 DIAGNOSIS — G4733 Obstructive sleep apnea (adult) (pediatric): Secondary | ICD-10-CM | POA: Insufficient documentation

## 2017-07-30 ENCOUNTER — Encounter: Payer: Self-pay | Admitting: Nurse Practitioner

## 2017-07-30 ENCOUNTER — Ambulatory Visit: Payer: 59 | Attending: Nurse Practitioner | Admitting: Nurse Practitioner

## 2017-07-30 ENCOUNTER — Other Ambulatory Visit: Payer: Self-pay

## 2017-07-30 VITALS — BP 127/79 | HR 98 | Temp 98.6°F | Ht 63.0 in | Wt 190.0 lb

## 2017-07-30 DIAGNOSIS — Z5181 Encounter for therapeutic drug level monitoring: Secondary | ICD-10-CM | POA: Insufficient documentation

## 2017-07-30 DIAGNOSIS — Z79899 Other long term (current) drug therapy: Secondary | ICD-10-CM | POA: Diagnosis not present

## 2017-07-30 DIAGNOSIS — E7800 Pure hypercholesterolemia, unspecified: Secondary | ICD-10-CM | POA: Diagnosis not present

## 2017-07-30 DIAGNOSIS — M545 Low back pain: Secondary | ICD-10-CM | POA: Diagnosis not present

## 2017-07-30 DIAGNOSIS — G894 Chronic pain syndrome: Secondary | ICD-10-CM | POA: Diagnosis not present

## 2017-07-30 DIAGNOSIS — Z7984 Long term (current) use of oral hypoglycemic drugs: Secondary | ICD-10-CM | POA: Diagnosis not present

## 2017-07-30 DIAGNOSIS — E1122 Type 2 diabetes mellitus with diabetic chronic kidney disease: Secondary | ICD-10-CM | POA: Insufficient documentation

## 2017-07-30 DIAGNOSIS — G8929 Other chronic pain: Secondary | ICD-10-CM

## 2017-07-30 DIAGNOSIS — M160 Bilateral primary osteoarthritis of hip: Secondary | ICD-10-CM | POA: Diagnosis not present

## 2017-07-30 DIAGNOSIS — Z87891 Personal history of nicotine dependence: Secondary | ICD-10-CM | POA: Diagnosis not present

## 2017-07-30 DIAGNOSIS — M8580 Other specified disorders of bone density and structure, unspecified site: Secondary | ICD-10-CM | POA: Diagnosis not present

## 2017-07-30 DIAGNOSIS — M961 Postlaminectomy syndrome, not elsewhere classified: Secondary | ICD-10-CM | POA: Diagnosis not present

## 2017-07-30 DIAGNOSIS — Z8249 Family history of ischemic heart disease and other diseases of the circulatory system: Secondary | ICD-10-CM | POA: Insufficient documentation

## 2017-07-30 DIAGNOSIS — N183 Chronic kidney disease, stage 3 (moderate): Secondary | ICD-10-CM | POA: Insufficient documentation

## 2017-07-30 DIAGNOSIS — Z833 Family history of diabetes mellitus: Secondary | ICD-10-CM | POA: Insufficient documentation

## 2017-07-30 DIAGNOSIS — Z7982 Long term (current) use of aspirin: Secondary | ICD-10-CM | POA: Diagnosis not present

## 2017-07-30 DIAGNOSIS — M47816 Spondylosis without myelopathy or radiculopathy, lumbar region: Secondary | ICD-10-CM | POA: Insufficient documentation

## 2017-07-30 DIAGNOSIS — Z8619 Personal history of other infectious and parasitic diseases: Secondary | ICD-10-CM | POA: Insufficient documentation

## 2017-07-30 DIAGNOSIS — M5412 Radiculopathy, cervical region: Secondary | ICD-10-CM

## 2017-07-30 DIAGNOSIS — G4700 Insomnia, unspecified: Secondary | ICD-10-CM | POA: Diagnosis not present

## 2017-07-30 DIAGNOSIS — I700 Atherosclerosis of aorta: Secondary | ICD-10-CM | POA: Insufficient documentation

## 2017-07-30 DIAGNOSIS — E538 Deficiency of other specified B group vitamins: Secondary | ICD-10-CM | POA: Diagnosis not present

## 2017-07-30 DIAGNOSIS — Z79891 Long term (current) use of opiate analgesic: Secondary | ICD-10-CM | POA: Diagnosis not present

## 2017-07-30 DIAGNOSIS — Z885 Allergy status to narcotic agent status: Secondary | ICD-10-CM | POA: Insufficient documentation

## 2017-07-30 DIAGNOSIS — M5010 Cervical disc disorder with radiculopathy, unspecified cervical region: Secondary | ICD-10-CM | POA: Diagnosis not present

## 2017-07-30 DIAGNOSIS — Z91013 Allergy to seafood: Secondary | ICD-10-CM | POA: Insufficient documentation

## 2017-07-30 DIAGNOSIS — I129 Hypertensive chronic kidney disease with stage 1 through stage 4 chronic kidney disease, or unspecified chronic kidney disease: Secondary | ICD-10-CM | POA: Diagnosis not present

## 2017-07-30 DIAGNOSIS — J45909 Unspecified asthma, uncomplicated: Secondary | ICD-10-CM | POA: Diagnosis not present

## 2017-07-30 DIAGNOSIS — D472 Monoclonal gammopathy: Secondary | ICD-10-CM | POA: Insufficient documentation

## 2017-07-30 DIAGNOSIS — M25512 Pain in left shoulder: Secondary | ICD-10-CM | POA: Insufficient documentation

## 2017-07-30 DIAGNOSIS — M810 Age-related osteoporosis without current pathological fracture: Secondary | ICD-10-CM | POA: Insufficient documentation

## 2017-07-30 DIAGNOSIS — Z888 Allergy status to other drugs, medicaments and biological substances status: Secondary | ICD-10-CM | POA: Insufficient documentation

## 2017-07-30 DIAGNOSIS — M7061 Trochanteric bursitis, right hip: Secondary | ICD-10-CM | POA: Insufficient documentation

## 2017-07-30 DIAGNOSIS — F329 Major depressive disorder, single episode, unspecified: Secondary | ICD-10-CM | POA: Insufficient documentation

## 2017-07-30 DIAGNOSIS — M533 Sacrococcygeal disorders, not elsewhere classified: Secondary | ICD-10-CM

## 2017-07-30 DIAGNOSIS — M7062 Trochanteric bursitis, left hip: Secondary | ICD-10-CM | POA: Insufficient documentation

## 2017-07-30 MED ORDER — OXYCODONE HCL 5 MG PO TABS: 2.5000 mg | ORAL_TABLET | ORAL | 0 refills | Status: DC | PRN

## 2017-07-30 MED ORDER — ONDANSETRON 4 MG PO TBDP: ORAL_TABLET | ORAL | 0 refills | Status: DC

## 2017-07-30 NOTE — Progress Notes (Signed)
  Safety precautions to be maintained throughout the outpatient stay will include: orient to surroundings, keep bed in low position, maintain call bell within reach at all times, provide assistance with transfer out of bed and ambulation.   Nursing Pain Medication Assessment:  Safety precautions to be maintained throughout the outpatient stay will include: orient to surroundings, keep bed in low position, maintain call bell within reach at all times, provide assistance with transfer out of bed and ambulation.  Medication Inspection Compliance: Stephanie Short did not comply with our request to bring her pills to be counted. She was reminded that bringing the medication bottles, even when empty, is a requirement.  Medication: None brought in. Pill/Patch Count: None available to be counted. Bottle Appearance: No container available. Did not bring bottle(s) to appointment. Filled Date: N/A Last Medication intake:  Ran out of medicine more than 48 hours ago.  Patient stated she lost medication bottle during trip to the [place] around May 30th 2019.

## 2017-07-30 NOTE — Progress Notes (Signed)
Patient's Name: Stephanie Short  MRN: [id number]  Referring Provider: Kirk Ruths, MD  DOB: 10-14-1950  PCP: Kirk Ruths, MD  DOS: 07/30/2017  Note by: Vevelyn Francois NP  Service setting: Ambulatory outpatient  Specialty: Interventional Pain Management  Location: [facility] (AMB) Pain Management Facility    Patient type: Established    Primary Reason(s) for Visit: Encounter for prescription drug management. (Level of risk: moderate)  CC: Medication Refill (oxycodone) and Medication Refill (zofran)  HPI  Stephanie Short is a 67 y.o. year old, female patient, who comes today for a medication management evaluation. She has HYPERLIPIDEMIA-MIXED; AORTIC ATHEROSCLEROSIS; Endometrial polyp; Chronic low back pain (Location of Primary Source of Pain) (Bilateral) (R>L); Lumbar spondylosis; Failed back surgical syndrome; Lumbar facet syndrome (Bilateral) (R>L); Long term current use of opiate analgesic; Long term prescription opiate use; Opiate use (22.5 MME/Day); Encounter for therapeutic drug level monitoring; Encounter for chronic pain management; Airway hyperreactivity; Atherosclerosis of abdominal aorta ([place]); Diabetes mellitus with stage 3 chronic kidney disease ([place]); Benign essential hypertension; Cannot sleep; Major depression in remission ([employer] [facility]); Burning or prickling sensation; Hemorrhage, postmenopausal; Pure hypercholesterolemia; Osteopenia; Chronic hip pain(2) (Bilateral) (L>R); Greater trochanteric bursitis of hips (Bilateral); Chronic sacroiliac joint pain (Bilateral); Osteoarthritis of hips (Bilateral); Pain management; Chronic pain of lower extremity  (Bilateral) (L>R); Postmenopausal osteoporosis; Chronic cervical radicular pain; Neurogenic pain; Chronic kidney disease; Vitamin B12 deficiency; Problem with medical care compliance; Poor historian; Asthma; Chronic neck pain (3) (B) (R>L); Monoclonal gammopathy; Chronic pain syndrome; Chronic left shoulder pain; DDD (degenerative disc  disease), cervical; and Healthcare maintenance on their problem list. Her primarily concern today is the Medication Refill (oxycodone) and Medication Refill (zofran)  Pain Assessment: Location: Lower, Left, Right(right side is worse) Back Radiating: Denies Onset: More than a month ago Duration: Chronic pain Quality: Constant, Aching Severity: 5 /10 (subjective, self-reported pain score)  Note: Reported level is compatible with observation.                          Effect on ADL: "Unable to bend down, pick up, lifting up is out of the question, unable to clean around the house. this is very restrictive and limiting" Timing: Constant Modifying factors: Medications BP: 127/79  HR: 98  Stephanie Short was last scheduled for an appointment on 03/31/2017 for medication management. During today's appointment we reviewed Stephanie Short's chronic pain status, as well as her outpatient medication regimen. She admits that her pain is the same. She admits that she was diagnosed with COPD.    The patient  reports that she does not use drugs. Her body mass index is 33.66 kg/m.  Further details on both, my assessment(s), as well as the proposed treatment plan, please see below.  Controlled Substance Pharmacotherapy Assessment REMS (Risk Evaluation and Mitigation Strategy)  Analgesic:Oxycodone 2.5 mg 3 times daily (oxycodone 7.5 mg per day) MME/day:11.81m/day.    RJanne Napoleon RN  07/30/2017  2:08 PM  Sign at close encounter  Safety precautions to be maintained throughout the outpatient stay will include: orient to surroundings, keep bed in low position, maintain call bell within reach at all times, provide assistance with transfer out of bed and ambulation.   Nursing Pain Medication Assessment:  Safety precautions to be maintained throughout the outpatient stay will include: orient to surroundings, keep bed in low position, maintain call bell within reach at all times, provide assistance with  transfer out of bed and ambulation.  Medication Inspection  Compliance: Stephanie Short did not comply with our request to bring her pills to be counted. She was reminded that bringing the medication bottles, even when empty, is a requirement.  Medication: None brought in. Pill/Patch Count: None available to be counted. Bottle Appearance: No container available. Did not bring bottle(s) to appointment. Filled Date: N/A Last Medication intake:  Ran out of medicine more than 48 hours ago.  Patient stated she lost medication bottle during trip to the [place] around May 30th 2019.    Pharmacokinetics: Liberation and absorption (onset of action): WNL Distribution (time to peak effect): WNL Metabolism and excretion (duration of action): WNL         Pharmacodynamics: Desired effects: Analgesia: Stephanie Short reports >50% benefit. Functional ability: Patient reports that medication allows her to accomplish basic ADLs Clinically meaningful improvement in function (CMIF): Sustained CMIF goals met Perceived effectiveness: Described as relatively effective, allowing for increase in activities of daily living (ADL) Undesirable effects: Side-effects or Adverse reactions: None reported Monitoring: [place] PMP: Online review of the past 74-monthperiod conducted. Compliant with practice rules and regulations Last UDS on record: Summary  Date Value Ref Range Status  03/31/2017 FINAL  Final    Comment:    ==================================================================== TOXASSURE SELECT 13 (MW) ==================================================================== Test                             Result       Flag       Units Drug Present and Declared for Prescription Verification   Oxycodone                      >5650        EXPECTED   ng/mg creat    Sources of oxycodone include scheduled prescription medications. Drug Absent but Declared for Prescription Verification   Alprazolam                     Not  Detected UNEXPECTED ng/mg creat   Oxymorphone                    Not Detected UNEXPECTED ng/mg creat   Noroxycodone                   Not Detected UNEXPECTED ng/mg creat   Noroxymorphone                 Not Detected UNEXPECTED ng/mg creat    A moderate to large amount of oxycodone is present; the    metabolites oxymorphone, noroxycodone and noroxymorphone are not    present. This is an atypical result. Although patients with    unusual metabolic profiles exist, they are rare. Review of    previous drug screen results or collection of a urine sample    several hours after a WITNESSED dose of the drug may help to    clarify the subject's ability to produce metabolite. ==================================================================== Test                      Result    Flag   Units      Ref Range   Creatinine              177              mg/dL      >=20 ==================================================================== Declared Medications:  The flagging and interpretation on this report are  based on the  following declared medications.  Unexpected results may arise from  inaccuracies in the declared medications.  **Note: The testing scope of this panel includes these medications:  Alprazolam  Oxycodone  **Note: The testing scope of this panel does not include following  reported medications:  Acetaminophen  Albuterol  Aspirin  Cyanocobalamin  Glimepiride  Ondansetron  Ranitidine  Valsartan ==================================================================== For clinical consultation, please call [phone]. ====================================================================    UDS interpretation: Compliant          Medication Assessment Form: Reviewed. Patient indicates being compliant with therapy Treatment compliance: Compliant Risk Assessment Profile: Aberrant behavior: See prior evaluations. None observed or detected today Comorbid factors increasing risk of  overdose: See prior notes. No additional risks detected today Risk of substance use disorder (SUD): Low Opioid Risk Tool - 07/30/17 1406      Family History of Substance Abuse   Alcohol  Negative    Illegal Drugs  Negative    Rx Drugs  Negative      Personal History of Substance Abuse   Alcohol  Negative    Illegal Drugs  Negative    Rx Drugs  Negative      Age   Age between 20-45 years   No      History of Preadolescent Sexual Abuse   History of Preadolescent Sexual Abuse  Negative or Female      Psychological Disease   Psychological Disease  Negative    Depression  Negative      Total Score   Opioid Risk Tool Scoring  0    Opioid Risk Interpretation  Low Risk      ORT Scoring interpretation table:  Score <3 = Low Risk for SUD  Score between 4-7 = Moderate Risk for SUD  Score >8 = High Risk for Opioid Abuse   Risk Mitigation Strategies:  Patient Counseling: Covered Patient-Prescriber Agreement (PPA): Present and active  Notification to other healthcare providers: Done  Pharmacologic Plan: No change in therapy, at this time.             Laboratory Chemistry  Inflammation Markers (CRP: Acute Phase) (ESR: Chronic Phase) Lab Results  Component Value Date   CRP 0.6 04/25/2015   ESRSEDRATE 7 04/25/2015                         Rheumatology Markers No results found for: RF, ANA, LABURIC, URICUR, LYMEIGGIGMAB, LYMEABIGMQN, HLAB27                      Renal Function Markers Lab Results  Component Value Date   BUN 17 06/03/2016   CREATININE 0.72 06/03/2016   GFRAA >60 06/03/2016   GFRNONAA >60 06/03/2016                             Hepatic Function Markers Lab Results  Component Value Date   AST 25 12/21/2015   ALT 15 12/21/2015   ALBUMIN 4.3 12/21/2015   ALKPHOS 84 12/21/2015   LIPASE 24 12/01/2014                        Electrolytes Lab Results  Component Value Date   NA 136 06/03/2016   K 4.1 06/03/2016   CL 105 06/03/2016   CALCIUM 9.4 06/03/2016    MG 1.9 04/25/2015  Neuropathy Markers Lab Results  Component Value Date   VITAMINB12 178 (L) 04/25/2015   HGBA1C (H) 02/06/2009    7.4 (NOTE) The ADA recommends the following therapeutic goal for glycemic control related to Hgb A1c measurement: Goal of therapy: <6.5 Hgb A1c  Reference: American Diabetes Association: Clinical Practice Recommendations 2010, Diabetes Care, 2010, 33: (Suppl  1).                        Bone Pathology Markers Lab Results  Component Value Date   [id number] 55 04/25/2015   [id number] 55 04/25/2015   [id number] <10 04/25/2015                         Coagulation Parameters Lab Results  Component Value Date   INR 1.0 09/16/2012   LABPROT 13.3 09/16/2012   PLT 234 06/03/2016   DDIMER  02/06/2009    0.30        AT THE INHOUSE ESTABLISHED CUTOFF VALUE OF 0.48 ug/mL FEU, THIS ASSAY HAS BEEN DOCUMENTED IN THE LITERATURE TO HAVE A SENSITIVITY AND NEGATIVE PREDICTIVE VALUE OF AT LEAST 98 TO 99%.  THE TEST RESULT SHOULD BE CORRELATED WITH AN ASSESSMENT OF THE CLINICAL PROBABILITY OF DVT / VTE.                        Cardiovascular Markers Lab Results  Component Value Date   CKTOTAL 56 01/10/2013   CKMB < 0.5 (L) 01/10/2013   TROPONINI <0.03 06/03/2016   HGB 13.2 06/03/2016   HCT 39.9 06/03/2016                         CA Markers No results found for: CEA, CA125, LABCA2                      Note: Lab results reviewed.  Recent Diagnostic Imaging Results  MM SCREENING BREAST TOMO BILATERAL CLINICAL DATA:  Screening.  EXAM: 2D DIGITAL SCREENING BILATERAL MAMMOGRAM WITH CAD AND ADJUNCT TOMO  COMPARISON:  Previous exam(s).  ACR Breast Density Category b: There are scattered areas of fibroglandular density.  FINDINGS: There are no findings suspicious for malignancy. Images were processed with CAD.  IMPRESSION: No mammographic evidence of malignancy. A result letter of this screening mammogram will be mailed  directly to the patient.  RECOMMENDATION: Screening mammogram in one year. (Code:[id number])  BI-RADS CATEGORY  1: Negative.  Electronically Signed   By: Pamelia Hoit M.D.   On: 10/09/2016 08:03  Complexity Note: Imaging results reviewed. Results shared with Ms. Haun, using Layman's terms.                         Meds   Current Outpatient Medications:  .  acetaminophen (TYLENOL) 500 MG tablet, Take 1,000-1,500 mg by mouth every 6 (six) hours as needed for mild pain., Disp: , Rfl:  .  albuterol (PROVENTIL HFA;VENTOLIN HFA) 108 (90 Base) MCG/ACT inhaler, Inhale 2 puffs into the lungs every 6 (six) hours as needed for wheezing or shortness of breath., Disp: , Rfl:  .  ALPRAZolam (XANAX) 0.25 MG tablet, Take 0.125-0.25 mg by mouth 2 (two) times daily as needed for anxiety or sleep. , Disp: , Rfl:  .  aspirin EC 81 MG tablet, Take 81 mg by mouth at bedtime., Disp: , Rfl:  .  Blood Glucose Monitoring  Suppl (FIFTY50 GLUCOSE METER 2.0) w/Device KIT, Use as directed. Dx E11.22 ONE TOUCH, Disp: , Rfl:  .  glimepiride (AMARYL) 2 MG tablet, Take 2 mg by mouth daily with breakfast., Disp: , Rfl:  .  ondansetron (ZOFRAN ODT) 4 MG disintegrating tablet, Allow 1-2 tablets to dissolve in your mouth every 8 hours as needed for nausea/vomiting, Disp: 30 tablet, Rfl: 0 .  ONE TOUCH ULTRA TEST test strip, USE TO CHECK BLOOD SUGAR 1-2 TIMES DAILY AS DIRECTED. DX E11.22., Disp: , Rfl: 5 .  ranitidine (ZANTAC) 150 MG tablet, TAKE 1 TABLET (150 MG TOTAL) BY MOUTH 2 (TWO) TIMES DAILY. AS NEEDED, Disp: , Rfl: 11 .  umeclidinium-vilanterol (ANORO ELLIPTA) 62.5-25 MCG/INH AEPB, Inhale into the lungs., Disp: , Rfl:  .  valsartan (DIOVAN) 80 MG tablet, Take 80 mg by mouth daily., Disp: , Rfl:  .  [START ON 08/29/2017] oxyCODONE (OXY IR/ROXICODONE) 5 MG immediate release tablet, Take 0.5 tablets (2.5 mg total) by mouth every 4 (four) hours as needed for severe pain., Disp: 90 tablet, Rfl: 0 .  oxyCODONE (OXY  IR/ROXICODONE) 5 MG immediate release tablet, Take 0.5 tablets (2.5 mg total) by mouth every 4 (four) hours as needed for severe pain., Disp: 90 tablet, Rfl: 0  ROS  Constitutional: Denies any fever or chills Gastrointestinal: No reported hemesis, hematochezia, vomiting, or acute GI distress Musculoskeletal: Denies any acute onset joint swelling, redness, loss of ROM, or weakness Neurological: No reported episodes of acute onset apraxia, aphasia, dysarthria, agnosia, amnesia, paralysis, loss of coordination, or loss of consciousness  Allergies  Ms. Rohe is allergic to codeine; dilaudid [hydromorphone hcl]; tramadol; gabapentin; hydrocodone-acetaminophen; hydromorphone; oxycodone-acetaminophen; promethazine; and scallops [shellfish allergy].  PFSH  Drug: Ms. Wigal  reports that she does not use drugs. Alcohol:  reports that she drinks alcohol. Tobacco:  reports that she quit smoking about 13 years ago. She has a 20.00 pack-year smoking history. She has never used smokeless tobacco. Medical:  has a past medical history of Asthma, Depression, Diabetes mellitus, Hepatitis C, Hypertension, and Insomnia. Surgical: Ms. Padia  has a past surgical history that includes Back surgery (2013); Hysteroscopy w/D&C (N/A, 08/18/2014); Knee arthroscopy (Right); Colonoscopy w/ polypectomy; and Colonoscopy with propofol (N/A, 03/13/2015). Family: family history includes Colon cancer in her other; Coronary artery disease (age of onset: 55) in her brother; Coronary artery disease (age of onset: 78) in her mother; Diabetes in her father and other; Heart attack in her brother; Heart disease in her father; Hypertension in her other.  Constitutional Exam  General appearance: Well nourished, well developed, and well hydrated. In no apparent acute distress Vitals:   07/30/17 1356  BP: 127/79  Pulse: 98  Temp: 98.6 F (37 C)  SpO2: 93%  Weight: 190 lb (86.2 kg)  Height: _0  (1.6 m)   BMI Assessment: Estimated  body mass index is 33.66 kg/m as calculated from the following:   Height as of this encounter: _1  (1.6 m).   Weight as of this encounter: 190 lb (86.2 kg). Psych/Mental status: Alert, oriented x 3 (person, place, & time)       Eyes: PERLA Respiratory: No evidence of acute respiratory distress  Cervical Spine Area Exam  Skin & Axial Inspection: No masses, redness, edema, swelling, or associated skin lesions Alignment: Symmetrical Functional ROM: Unrestricted ROM      Stability: No instability detected Muscle Tone/Strength: Functionally intact. No obvious neuro-muscular anomalies detected. Sensory (Neurological): Unimpaired Palpation: No palpable anomalies  Upper Extremity (UE) Exam    Side: Right upper extremity  Side: Left upper extremity  Skin & Extremity Inspection: Skin color, temperature, and hair growth are WNL. No peripheral edema or cyanosis. No masses, redness, swelling, asymmetry, or associated skin lesions. No contractures.  Skin & Extremity Inspection: Skin color, temperature, and hair growth are WNL. No peripheral edema or cyanosis. No masses, redness, swelling, asymmetry, or associated skin lesions. No contractures.  Functional ROM: Unrestricted ROM          Functional ROM: Unrestricted ROM          Muscle Tone/Strength: Functionally intact. No obvious neuro-muscular anomalies detected.  Muscle Tone/Strength: Functionally intact. No obvious neuro-muscular anomalies detected.  Sensory (Neurological): Unimpaired          Sensory (Neurological): Unimpaired          Palpation: No palpable anomalies              Palpation: No palpable anomalies              Provocative Test(s):  Phalen's test: deferred Tinel's test: deferred Apley's scratch test (touch opposite shoulder):  Action 1 (Across chest): deferred Action 2 (Overhead): deferred Action 3 (LB reach): deferred   Provocative Test(s):  Phalen's test: deferred Tinel's test: deferred Apley's scratch test  (touch opposite shoulder):  Action 1 (Across chest): deferred Action 2 (Overhead): deferred Action 3 (LB reach): deferred    Thoracic Spine Area Exam  Skin & Axial Inspection: No masses, redness, or swelling Alignment: Symmetrical Functional ROM: Unrestricted ROM Stability: No instability detected Muscle Tone/Strength: Functionally intact. No obvious neuro-muscular anomalies detected. Sensory (Neurological): Unimpaired Muscle strength & Tone: No palpable anomalies  Lumbar Spine Area Exam  Skin & Axial Inspection: No masses, redness, or swelling Alignment: Symmetrical Functional ROM: Unrestricted ROM       Stability: No instability detected Muscle Tone/Strength: Functionally intact. No obvious neuro-muscular anomalies detected. Sensory (Neurological): Unimpaired Palpation: No palpable anomalies       Provocative Tests: Lumbar Hyperextension/rotation test: deferred today       Lumbar quadrant test (Kemp's test): deferred today       Lumbar Lateral bending test: deferred today       Patrick's Maneuver: deferred today                   FABER test: deferred today                   Thigh-thrust test: deferred today       S-I compression test: deferred today       S-I distraction test: deferred today        Gait & Posture Assessment  Ambulation: Unassisted Gait: Relatively normal for age and body habitus Posture: WNL   Lower Extremity Exam    Side: Right lower extremity  Side: Left lower extremity  Stability: No instability observed          Stability: No instability observed          Skin & Extremity Inspection: Skin color, temperature, and hair growth are WNL. No peripheral edema or cyanosis. No masses, redness, swelling, asymmetry, or associated skin lesions. No contractures.  Skin & Extremity Inspection: Skin color, temperature, and hair growth are WNL. No peripheral edema or cyanosis. No masses, redness, swelling, asymmetry, or associated skin lesions. No contractures.   Functional ROM: Unrestricted ROM                  Functional ROM: Unrestricted  ROM                  Muscle Tone/Strength: Functionally intact. No obvious neuro-muscular anomalies detected.  Muscle Tone/Strength: Functionally intact. No obvious neuro-muscular anomalies detected.  Sensory (Neurological): Unimpaired  Sensory (Neurological): Unimpaired  Palpation: No palpable anomalies  Palpation: No palpable anomalies   Assessment  Primary Diagnosis & Pertinent Problem List: The primary encounter diagnosis was Lumbar spondylosis. Diagnoses of Chronic low back pain (Location of Primary Source of Pain) (Bilateral) (R>L), Chronic cervical radicular pain, Chronic sacroiliac joint pain (Bilateral), and Chronic pain syndrome were also pertinent to this visit.  Status Diagnosis  Controlled Controlled Controlled 1. Lumbar spondylosis   2. Chronic low back pain (Location of Primary Source of Pain) (Bilateral) (R>L)   3. Chronic cervical radicular pain   4. Chronic sacroiliac joint pain (Bilateral)   5. Chronic pain syndrome     Problems updated and reviewed during this visit: Problem  Pure Hypercholesterolemia   Last Assessment & Plan:  Appropriate diet is being attempted and myalgia's are not noted.    Last Assessment & Plan:  Diet for healthy cholesterol is being attempted and no clear myalgia's or other side effects are noted.   Major Depression in Remission (Hcc)   Last Assessment & Plan:  Mood remains good with usual variations.  Last Assessment & Plan:  Mood has been doing relatively well.  Last Assessment & Plan:  Mood waxes and wanes but is doing fairly well at this point.   Last Assessment & Plan:  Mood is doing well on meds currently    Atherosclerosis of Abdominal Aorta (Hcc)   Overview:  On 8-11 lumbar xray  Last Assessment & Plan:  No abd pain or back pain noted.  Overview:  On 8-11 lumbar xray  Last Assessment & Plan:  No abd pain or back pain and chol is  treated.   Overview:  On 8-11 lumbar xray  Last Assessment & Plan:  No abd pain or back pain on meds.   On 8-11 lumbar xray  Last Assessment & Plan:  No abd pain or back pain   Diabetes Mellitus With Stage 3 Chronic Kidney Disease ([facility])   Overview:  Intermittent 2 vs 3 per gfr with albuminuria  Last Assessment & Plan:  Continuing on diet as well as prescribed regimen without severe hypoglycemia being noted.  Nausea and itching are not symptomatic and nsaids are being avoided.   Overview:  Intermittent 2 vs 3 per gfr with albuminuria  Last Assessment & Plan:  Continuing on diet as well as prescribed regimen with severe hypoglycemia. Intermittent 2 vs 3 per gfr with albuminuria  Intermittent 2 vs 3 per gfr with albuminuria  Last Assessment & Plan:  Diabetic diet is followed and no excessive highs or lows are noted.  Nausea and itching are not symptomatic and nsaids are being avoided.   Benign Essential Hypertension   Last Assessment & Plan:  Is compliant with hypertensive medications without clear side effects or lack of control.   Last Assessment & Plan:  Patients blood pressure has seemingly been controlled without significant side effects such as dizziness or slow heart rate.    Last Assessment & Plan:  Taking medications without noted side effects or dizziness.     Asthma   Last Assessment & Plan:  Recent cough and congestion noted.   Last Assessment & Plan:  The patient's breathing has been doing well and no recent flairs are noted.  Ddd (Degenerative Disc Disease), Cervical  Healthcare Maintenance   Overview:  Pneumovax 8-09 and prevnar 13 in 7-18. Mammogram yearly. Colonoscopy 14 with polyp hx, consider shingrix with more date    Plan of Care  Pharmacotherapy (Medications Ordered): Meds ordered this encounter  Medications  . oxyCODONE (OXY IR/ROXICODONE) 5 MG immediate release tablet    Sig: Take 0.5 tablets (2.5 mg total) by mouth every 4 (four) hours  as needed for severe pain.    Dispense:  90 tablet    Refill:  0    Do not place this medication, or any other prescription from our practice, on "Automatic Refill". Patient may have prescription filled one day early if pharmacy is closed on scheduled refill date. Do not fill until: 08/29/2017 To last until:09/28/2017    Order Specific Question:   Supervising Provider    Answer:   Milinda Pointer [phone]  . oxyCODONE (OXY IR/ROXICODONE) 5 MG immediate release tablet    Sig: Take 0.5 tablets (2.5 mg total) by mouth every 4 (four) hours as needed for severe pain.    Dispense:  90 tablet    Refill:  0    Do not place this medication, or any other prescription from our practice, on "Automatic Refill". Patient may have prescription filled one day early if pharmacy is closed on scheduled refill date. Do not fill until: 07/30/2017 To last until: 08/29/2017    Order Specific Question:   Supervising Provider    Answer:   Milinda Pointer [phone]  . ondansetron (ZOFRAN ODT) 4 MG disintegrating tablet    Sig: Allow 1-2 tablets to dissolve in your mouth every 8 hours as needed for nausea/vomiting    Dispense:  30 tablet    Refill:  0    Order Specific Question:   Supervising Provider    Answer:   Milinda Pointer [phone]   New Prescriptions   No medications on file   Medications administered today: Vevelyn Pat had no medications administered during this visit. Lab-work, procedure(s), and/or referral(s): No orders of the defined types were placed in this encounter.  Imaging and/or referral(s): None Interventional therapies: Planned, scheduled, and/or pending:  Not at this time.   Considering:  Repeat diagnostic lumbar facet block Possiblebilateral lumbar facet radiofrequency ablation Possible diagnostic bilateral sacroiliac joint block Possible diagnosticbilateral intra-articular hip joint injection    Palliative PRN treatment(s):  For the low back pain,bilateral  diagnostic lumbar facet block + diagnostic bilateral sacroiliac joint injection For the hip pain, diagnostic intra-articular hip joint injection      Provider-requested follow-up: Return in about 2 months (around 09/30/2017) for MedMgmt with [facility]).  Future Appointments  Date Time Provider [place]  09/30/2017  1:45 PM Vevelyn Francois, NP [facility] None   Primary Care Physician: Kirk Ruths, MD Location: [facility] Note by: Vevelyn Francois NP Date: 07/30/2017; Time: 4:09 PM  Pain Score Disclaimer: We use the NRS-11 scale. This is a self-reported, subjective measurement of pain severity with only modest accuracy. It is used primarily to identify changes within a particular patient. It must be understood that outpatient pain scales are significantly less accurate that those used for research, where they can be applied under ideal controlled circumstances with minimal exposure to variables. In reality, the score is likely to be a combination of pain intensity and pain affect, where pain affect describes the degree of emotional arousal or changes in action readiness caused by the sensory experience of pain.  Factors such as social and work situation, setting, emotional state, anxiety levels, expectation, and prior pain experience may influence pain perception and show large inter-individual differences that may also be affected by time variables.  Patient instructions provided during this appointment: Patient Instructions  ____________________________________________________________________________________________  Medication Rules  Applies to: All patients receiving prescriptions (written or electronic).  Pharmacy of record: Pharmacy where electronic prescriptions will be sent. If written prescriptions are taken to a different pharmacy, please inform the nursing staff. The pharmacy listed in the electronic medical record should be the  one where you would like electronic prescriptions to be sent.  Prescription refills: Only during scheduled appointments. Applies to both, written and electronic prescriptions.  NOTE: The following applies primarily to controlled substances (Opioid* Pain Medications).   Patient's responsibilities: 1. Pain Pills: Bring all pain pills to every appointment (except for procedure appointments). 2. Pill Bottles: Bring pills in original pharmacy bottle. Always bring newest bottle. Bring bottle, even if empty. 3. Medication refills: You are responsible for knowing and keeping track of what medications you need refilled. The day before your appointment, write a list of all prescriptions that need to be refilled. Bring that list to your appointment and give it to the admitting nurse. Prescriptions will be written only during appointments. If you forget a medication, it will not be "Called in", "Faxed", or "electronically sent". You will need to get another appointment to get these prescribed. 4. Prescription Accuracy: You are responsible for carefully inspecting your prescriptions before leaving our office. Have the discharge nurse carefully go over each prescription with you, before taking them home. Make sure that your name is accurately spelled, that your address is correct. Check the name and dose of your medication to make sure it is accurate. Check the number of pills, and the written instructions to make sure they are clear and accurate. Make sure that you are given enough medication to last until your next medication refill appointment. 5. Taking Medication: Take medication as prescribed. Never take more pills than instructed. Never take medication more frequently than prescribed. Taking less pills or less frequently is permitted and encouraged, when it comes to controlled substances (written prescriptions).  6. Inform other Doctors: Always inform, all of your healthcare providers, of all the medications  you take. 7. Pain Medication from other Providers: You are not allowed to accept any additional pain medication from any other Doctor or Healthcare provider. There are two exceptions to this rule. (see below) In the event that you require additional pain medication, you are responsible for notifying us, as stated below. 8. Medication Agreement: You are responsible for carefully reading and following our Medication Agreement. This must be signed before receiving any prescriptions from our practice. Safely store a copy of your signed Agreement. Violations to the Agreement will result in no further prescriptions. (Additional copies of our Medication Agreement are available upon request.) 9. Laws, Rules, & Regulations: All patients are expected to follow all Federal and [employer], Rules, [employer]. Ignorance of the Laws does not constitute a valid excuse. The use of any illegal substances is prohibited. 10. Adopted CDC guidelines & recommendations: Target dosing levels will be at or below 60 MME/day. Use of benzodiazepines** is not recommended.  Exceptions: There are only two exceptions to the rule of not receiving pain medications from other Healthcare Providers. 1. Exception #1 (Emergencies): In the event of an emergency (i.e.: accident requiring emergency care), you are allowed to receive additional pain medication. However, you  are responsible for: As soon as you are able, call our office [phone], at any time of the day or night, and leave a message stating your name, the date and nature of the emergency, and the name and dose of the medication prescribed. In the event that your call is answered by a member of our staff, make sure to document and save the date, time, and the name of the person that took your information.  2. Exception #2 (Planned Surgery): In the event that you are scheduled by another doctor or dentist to have any type of surgery or procedure, you are allowed (for a  period no longer than 30 days), to receive additional pain medication, for the acute post-op pain. However, in this case, you are responsible for picking up a copy of our "Post-op Pain Management for Surgeons" handout, and giving it to your surgeon or dentist. This document is available at our office, and does not require an appointment to obtain it. Simply go to our office during business hours (Monday-Thursday from 8:00 AM to 4:00 PM) (Friday 8:00 AM to 12:00 Noon) or if you have a scheduled appointment with [place], prior to your surgery, and ask for it by name. In addition, you will need to provide [place] with your name, name of your surgeon, type of surgery, and date of procedure or surgery.  *Opioid medications include: morphine, codeine, oxycodone, oxymorphone, hydrocodone, hydromorphone, meperidine, tramadol, tapentadol, buprenorphine, fentanyl, methadone. **Benzodiazepine medications include: diazepam (Valium), alprazolam (Xanax), clonazepam (Klonopine), lorazepam (Ativan), clorazepate (Tranxene), chlordiazepoxide (Librium), estazolam (Prosom), oxazepam (Serax), temazepam (Restoril), triazolam (Halcion) (Last updated: 03/19/2017) ____________________________________________________________________________________________

## 2017-07-30 NOTE — Patient Instructions (Signed)
____________________________________________________________________________________________  Medication Rules  Applies to: All patients receiving prescriptions (written or electronic).  Pharmacy of record: Pharmacy where electronic prescriptions will be sent. If written prescriptions are taken to a different pharmacy, please inform the nursing staff. The pharmacy listed in the electronic medical record should be the one where you would like electronic prescriptions to be sent.  Prescription refills: Only during scheduled appointments. Applies to both, written and electronic prescriptions.  NOTE: The following applies primarily to controlled substances (Opioid* Pain Medications).   Patient's responsibilities: 1. Pain Pills: Bring all pain pills to every appointment (except for procedure appointments). 2. Pill Bottles: Bring pills in original pharmacy bottle. Always bring newest bottle. Bring bottle, even if empty. 3. Medication refills: You are responsible for knowing and keeping track of what medications you need refilled. The day before your appointment, write a list of all prescriptions that need to be refilled. Bring that list to your appointment and give it to the admitting nurse. Prescriptions will be written only during appointments. If you forget a medication, it will not be "Called in", "Faxed", or "electronically sent". You will need to get another appointment to get these prescribed. 4. Prescription Accuracy: You are responsible for carefully inspecting your prescriptions before leaving our office. Have the discharge nurse carefully go over each prescription with you, before taking them home. Make sure that your name is accurately spelled, that your address is correct. Check the name and dose of your medication to make sure it is accurate. Check the number of pills, and the written instructions to make sure they are clear and accurate. Make sure that you are given enough medication to last  until your next medication refill appointment. 5. Taking Medication: Take medication as prescribed. Never take more pills than instructed. Never take medication more frequently than prescribed. Taking less pills or less frequently is permitted and encouraged, when it comes to controlled substances (written prescriptions).  6. Inform other Doctors: Always inform, all of your healthcare providers, of all the medications you take. 7. Pain Medication from other Providers: You are not allowed to accept any additional pain medication from any other Doctor or Healthcare provider. There are two exceptions to this rule. (see below) In the event that you require additional pain medication, you are responsible for notifying us, as stated below. 8. Medication Agreement: You are responsible for carefully reading and following our Medication Agreement. This must be signed before receiving any prescriptions from our practice. Safely store a copy of your signed Agreement. Violations to the Agreement will result in no further prescriptions. (Additional copies of our Medication Agreement are available upon request.) 9. Laws, Rules, & Regulations: All patients are expected to follow all Federal and State Laws, Statutes, Rules, & Regulations. Ignorance of the Laws does not constitute a valid excuse. The use of any illegal substances is prohibited. 10. Adopted CDC guidelines & recommendations: Target dosing levels will be at or below 60 MME/day. Use of benzodiazepines** is not recommended.  Exceptions: There are only two exceptions to the rule of not receiving pain medications from other Healthcare Providers. 1. Exception #1 (Emergencies): In the event of an emergency (i.e.: accident requiring emergency care), you are allowed to receive additional pain medication. However, you are responsible for: As soon as you are able, call our office [phone], at any time of the day or night, and leave a message stating your name, the  date and nature of the emergency, and the name and dose of the medication   prescribed. In the event that your call is answered by a member of our staff, make sure to document and save the date, time, and the name of the person that took your information.  2. Exception #2 (Planned Surgery): In the event that you are scheduled by another doctor or dentist to have any type of surgery or procedure, you are allowed (for a period no longer than 30 days), to receive additional pain medication, for the acute post-op pain. However, in this case, you are responsible for picking up a copy of our "Post-op Pain Management for Surgeons" handout, and giving it to your surgeon or dentist. This document is available at our office, and does not require an appointment to obtain it. Simply go to our office during business hours (Monday-Thursday from 8:00 AM to 4:00 PM) (Friday 8:00 AM to 12:00 Noon) or if you have a scheduled appointment with us, prior to your surgery, and ask for it by name. In addition, you will need to provide us with your name, name of your surgeon, type of surgery, and date of procedure or surgery.  *Opioid medications include: morphine, codeine, oxycodone, oxymorphone, hydrocodone, hydromorphone, meperidine, tramadol, tapentadol, buprenorphine, fentanyl, methadone. **Benzodiazepine medications include: diazepam (Valium), alprazolam (Xanax), clonazepam (Klonopine), lorazepam (Ativan), clorazepate (Tranxene), chlordiazepoxide (Librium), estazolam (Prosom), oxazepam (Serax), temazepam (Restoril), triazolam (Halcion) (Last updated: 03/19/2017) ____________________________________________________________________________________________    

## 2017-08-11 ENCOUNTER — Other Ambulatory Visit: Payer: Self-pay

## 2017-08-11 ENCOUNTER — Telehealth: Payer: Self-pay | Admitting: *Deleted

## 2017-08-11 MED ORDER — VITAMIN B-12 1000 MCG PO TABS: 1000.0000 ug | ORAL_TABLET | Freq: Every day | ORAL | 1 refills | Status: DC

## 2017-08-11 MED ORDER — ONDANSETRON 4 MG PO TBDP: ORAL_TABLET | ORAL | 0 refills | Status: DC

## 2017-08-13 ENCOUNTER — Encounter: Payer: Self-pay | Admitting: Emergency Medicine

## 2017-08-13 ENCOUNTER — Emergency Department: Payer: 59

## 2017-08-13 ENCOUNTER — Ambulatory Visit: Payer: 59 | Attending: Neurology

## 2017-08-13 ENCOUNTER — Other Ambulatory Visit: Payer: Self-pay

## 2017-08-13 ENCOUNTER — Inpatient Hospital Stay
Admission: EM | Admit: 2017-08-13 | Discharge: 2017-08-19 | DRG: 291 | Disposition: A | Discharge status: Home / Self Care | Payer: 59 | Attending: Internal Medicine | Admitting: Internal Medicine

## 2017-08-13 DIAGNOSIS — Z7984 Long term (current) use of oral hypoglycemic drugs: Secondary | ICD-10-CM | POA: Diagnosis not present

## 2017-08-13 DIAGNOSIS — J9601 Acute respiratory failure with hypoxia: Secondary | ICD-10-CM

## 2017-08-13 DIAGNOSIS — Z8619 Personal history of other infectious and parasitic diseases: Secondary | ICD-10-CM | POA: Diagnosis not present

## 2017-08-13 DIAGNOSIS — J449 Chronic obstructive pulmonary disease, unspecified: Secondary | ICD-10-CM

## 2017-08-13 DIAGNOSIS — M810 Age-related osteoporosis without current pathological fracture: Secondary | ICD-10-CM | POA: Diagnosis present

## 2017-08-13 DIAGNOSIS — R0602 Shortness of breath: Secondary | ICD-10-CM | POA: Diagnosis present

## 2017-08-13 DIAGNOSIS — I700 Atherosclerosis of aorta: Secondary | ICD-10-CM | POA: Diagnosis present

## 2017-08-13 DIAGNOSIS — J9600 Acute respiratory failure, unspecified whether with hypoxia or hypercapnia: Secondary | ICD-10-CM | POA: Diagnosis present

## 2017-08-13 DIAGNOSIS — Z833 Family history of diabetes mellitus: Secondary | ICD-10-CM | POA: Diagnosis not present

## 2017-08-13 DIAGNOSIS — M8580 Other specified disorders of bone density and structure, unspecified site: Secondary | ICD-10-CM | POA: Diagnosis present

## 2017-08-13 DIAGNOSIS — Z7982 Long term (current) use of aspirin: Secondary | ICD-10-CM

## 2017-08-13 DIAGNOSIS — J441 Chronic obstructive pulmonary disease with (acute) exacerbation: Secondary | ICD-10-CM | POA: Diagnosis present

## 2017-08-13 DIAGNOSIS — I272 Pulmonary hypertension, unspecified: Secondary | ICD-10-CM | POA: Diagnosis present

## 2017-08-13 DIAGNOSIS — Z885 Allergy status to narcotic agent status: Secondary | ICD-10-CM

## 2017-08-13 DIAGNOSIS — G894 Chronic pain syndrome: Secondary | ICD-10-CM | POA: Diagnosis present

## 2017-08-13 DIAGNOSIS — G4700 Insomnia, unspecified: Secondary | ICD-10-CM | POA: Diagnosis present

## 2017-08-13 DIAGNOSIS — Z87891 Personal history of nicotine dependence: Secondary | ICD-10-CM | POA: Diagnosis not present

## 2017-08-13 DIAGNOSIS — T380X5A Adverse effect of glucocorticoids and synthetic analogues, initial encounter: Secondary | ICD-10-CM | POA: Diagnosis not present

## 2017-08-13 DIAGNOSIS — I5033 Acute on chronic diastolic (congestive) heart failure: Secondary | ICD-10-CM | POA: Diagnosis present

## 2017-08-13 DIAGNOSIS — I5031 Acute diastolic (congestive) heart failure: Secondary | ICD-10-CM

## 2017-08-13 DIAGNOSIS — E7800 Pure hypercholesterolemia, unspecified: Secondary | ICD-10-CM | POA: Diagnosis present

## 2017-08-13 DIAGNOSIS — I130 Hypertensive heart and chronic kidney disease with heart failure and stage 1 through stage 4 chronic kidney disease, or unspecified chronic kidney disease: Principal | ICD-10-CM | POA: Diagnosis present

## 2017-08-13 DIAGNOSIS — Z8249 Family history of ischemic heart disease and other diseases of the circulatory system: Secondary | ICD-10-CM | POA: Diagnosis not present

## 2017-08-13 DIAGNOSIS — E538 Deficiency of other specified B group vitamins: Secondary | ICD-10-CM | POA: Diagnosis present

## 2017-08-13 DIAGNOSIS — E782 Mixed hyperlipidemia: Secondary | ICD-10-CM | POA: Diagnosis present

## 2017-08-13 DIAGNOSIS — N182 Chronic kidney disease, stage 2 (mild): Secondary | ICD-10-CM | POA: Diagnosis present

## 2017-08-13 DIAGNOSIS — G4733 Obstructive sleep apnea (adult) (pediatric): Secondary | ICD-10-CM | POA: Diagnosis present

## 2017-08-13 DIAGNOSIS — F418 Other specified anxiety disorders: Secondary | ICD-10-CM | POA: Diagnosis present

## 2017-08-13 DIAGNOSIS — E1165 Type 2 diabetes mellitus with hyperglycemia: Secondary | ICD-10-CM | POA: Diagnosis not present

## 2017-08-13 DIAGNOSIS — Z91013 Allergy to seafood: Secondary | ICD-10-CM

## 2017-08-13 DIAGNOSIS — E1122 Type 2 diabetes mellitus with diabetic chronic kidney disease: Secondary | ICD-10-CM | POA: Diagnosis present

## 2017-08-13 LAB — CBC WITH DIFFERENTIAL/PLATELET
BASOS ABS: 0 10*3/uL (ref 0–0.1)
Basophils Relative: 1 %
EOS ABS: 0.1 10*3/uL (ref 0–0.7)
EOS PCT: 1 %
HCT: 46.6 % (ref 35.0–47.0)
Hemoglobin: 15.6 g/dL (ref 12.0–16.0)
LYMPHS PCT: 15 %
Lymphs Abs: 1.2 10*3/uL (ref 1.0–3.6)
MCH: 28.2 pg (ref 26.0–34.0)
MCHC: 33.5 g/dL (ref 32.0–36.0)
MCV: 84.4 fL (ref 80.0–100.0)
MONO ABS: 0.7 10*3/uL (ref 0.2–0.9)
Monocytes Relative: 8 %
Neutro Abs: 6.3 10*3/uL (ref 1.4–6.5)
Neutrophils Relative %: 75 %
PLATELETS: 217 10*3/uL (ref 150–440)
RBC: 5.53 MIL/uL — ABNORMAL HIGH (ref 3.80–5.20)
RDW: 16.8 % — AB (ref 11.5–14.5)
WBC: 8.3 10*3/uL (ref 3.6–11.0)

## 2017-08-13 LAB — COMPREHENSIVE METABOLIC PANEL
ALT: 13 U/L (ref 0–44)
AST: 20 U/L (ref 15–41)
Albumin: 3.9 g/dL (ref 3.5–5.0)
Alkaline Phosphatase: 106 U/L (ref 38–126)
Anion gap: 8 (ref 5–15)
BUN: 19 mg/dL (ref 8–23)
CHLORIDE: 107 mmol/L (ref 98–111)
CO2: 25 mmol/L (ref 22–32)
Calcium: 9.1 mg/dL (ref 8.9–10.3)
Creatinine, Ser: 1.13 mg/dL — ABNORMAL HIGH (ref 0.44–1.00)
GFR, EST AFRICAN AMERICAN: 57 mL/min — AB (ref >60)
GFR, EST NON AFRICAN AMERICAN: 49 mL/min — AB (ref >60)
Glucose, Bld: 197 mg/dL — ABNORMAL HIGH (ref 70–99)
POTASSIUM: 4.3 mmol/L (ref 3.5–5.1)
Sodium: 140 mmol/L (ref 135–145)
Total Bilirubin: 0.6 mg/dL (ref 0.3–1.2)
Total Protein: 8.5 g/dL — ABNORMAL HIGH (ref 6.5–8.1)

## 2017-08-13 LAB — TROPONIN I: Troponin I: <0.03 ng/mL (ref <0.03)

## 2017-08-13 LAB — BLOOD GAS, VENOUS
PCO2 VEN: 49 mmHg (ref 44.0–60.0)
PO2 VEN: 35 mmHg (ref 32.0–45.0)
Patient temperature: 37
pH, Ven: 7.35 (ref 7.250–7.430)

## 2017-08-13 LAB — FIBRIN DERIVATIVES D-DIMER (ARMC ONLY): Fibrin derivatives D-dimer (ARMC): 1350.16 ng/mL (FEU) — ABNORMAL HIGH (ref 0.00–499.00)

## 2017-08-13 LAB — GLUCOSE, CAPILLARY: GLUCOSE-CAPILLARY: 349 mg/dL — AB (ref 70–99)

## 2017-08-13 LAB — BRAIN NATRIURETIC PEPTIDE: B Natriuretic Peptide: 254 pg/mL — ABNORMAL HIGH (ref 0.0–100.0)

## 2017-08-13 MED ORDER — ALPRAZOLAM 0.25 MG PO TABS
0.1250 mg | ORAL_TABLET | Freq: Two times a day (BID) | ORAL | Status: DC | PRN
  Administered 2017-08-13: 0.125 mg via ORAL
  Filled 2017-08-13 (×2): qty 1

## 2017-08-13 MED ORDER — IPRATROPIUM-ALBUTEROL 0.5-2.5 (3) MG/3ML IN SOLN
3.0000 mL | RESPIRATORY_TRACT | Status: DC
  Administered 2017-08-13 – 2017-08-14 (×6): 3 mL via RESPIRATORY_TRACT
  Filled 2017-08-13 (×7): qty 3

## 2017-08-13 MED ORDER — METHYLPREDNISOLONE SODIUM SUCC 125 MG IJ SOLR
60.0000 mg | Freq: Four times a day (QID) | INTRAMUSCULAR | Status: DC
  Administered 2017-08-13 – 2017-08-15 (×6): 60 mg via INTRAVENOUS
  Filled 2017-08-13 (×6): qty 2

## 2017-08-13 MED ORDER — UMECLIDINIUM-VILANTEROL 62.5-25 MCG/INH IN AEPB
1.0000 | INHALATION_SPRAY | Freq: Every day | RESPIRATORY_TRACT | Status: DC
  Administered 2017-08-15 – 2017-08-19 (×5): 1 via RESPIRATORY_TRACT
  Filled 2017-08-13: qty 14

## 2017-08-13 MED ORDER — ONDANSETRON 4 MG PO TBDP
4.0000 mg | ORAL_TABLET | Freq: Three times a day (TID) | ORAL | Status: DC | PRN
  Administered 2017-08-14 – 2017-08-15 (×2): 4 mg via ORAL
  Filled 2017-08-13 (×5): qty 1

## 2017-08-13 MED ORDER — GLIMEPIRIDE 2 MG PO TABS
2.0000 mg | ORAL_TABLET | Freq: Every day | ORAL | Status: DC
  Filled 2017-08-13: qty 1

## 2017-08-13 MED ORDER — IPRATROPIUM-ALBUTEROL 0.5-2.5 (3) MG/3ML IN SOLN
3.0000 mL | Freq: Once | RESPIRATORY_TRACT | Status: AC
  Administered 2017-08-13: 3 mL via RESPIRATORY_TRACT
  Filled 2017-08-13: qty 3

## 2017-08-13 MED ORDER — INSULIN ASPART 100 UNIT/ML ~~LOC~~ SOLN: 0.0000 [IU] | Freq: Three times a day (TID) | SUBCUTANEOUS | Status: DC

## 2017-08-13 MED ORDER — FAMOTIDINE 20 MG PO TABS
10.0000 mg | ORAL_TABLET | Freq: Two times a day (BID) | ORAL | Status: DC
  Administered 2017-08-13 – 2017-08-19 (×12): 10 mg via ORAL
  Filled 2017-08-13 (×12): qty 1

## 2017-08-13 MED ORDER — FUROSEMIDE 10 MG/ML IJ SOLN
20.0000 mg | Freq: Three times a day (TID) | INTRAMUSCULAR | Status: DC
  Administered 2017-08-13 – 2017-08-19 (×18): 20 mg via INTRAVENOUS
  Filled 2017-08-13 (×18): qty 2

## 2017-08-13 MED ORDER — IOPAMIDOL (ISOVUE-370) INJECTION 76%
75.0000 mL | Freq: Once | INTRAVENOUS | Status: AC | PRN
  Administered 2017-08-13: 75 mL via INTRAVENOUS

## 2017-08-13 MED ORDER — IRBESARTAN 75 MG PO TABS
37.5000 mg | ORAL_TABLET | Freq: Every day | ORAL | Status: DC
  Administered 2017-08-14 – 2017-08-19 (×6): 37.5 mg via ORAL
  Filled 2017-08-13 (×6): qty 0.5

## 2017-08-13 MED ORDER — HEPARIN SODIUM (PORCINE) 5000 UNIT/ML IJ SOLN
5000.0000 [IU] | Freq: Three times a day (TID) | INTRAMUSCULAR | Status: DC
  Administered 2017-08-13 – 2017-08-18 (×15): 5000 [IU] via SUBCUTANEOUS
  Filled 2017-08-13 (×17): qty 1

## 2017-08-13 MED ORDER — VITAMIN B-12 1000 MCG PO TABS
1000.0000 ug | ORAL_TABLET | Freq: Every day | ORAL | Status: DC
  Administered 2017-08-13 – 2017-08-18 (×6): 1000 ug via ORAL
  Filled 2017-08-13 (×6): qty 1

## 2017-08-13 MED ORDER — MAGNESIUM SULFATE 2 GM/50ML IV SOLN
2.0000 g | Freq: Once | INTRAVENOUS | Status: AC
  Administered 2017-08-13: 2 g via INTRAVENOUS
  Filled 2017-08-13: qty 50

## 2017-08-13 MED ORDER — DOCUSATE SODIUM 100 MG PO CAPS
100.0000 mg | ORAL_CAPSULE | Freq: Two times a day (BID) | ORAL | Status: DC | PRN
  Administered 2017-08-16 – 2017-08-17 (×2): 100 mg via ORAL
  Filled 2017-08-13 (×2): qty 1

## 2017-08-13 MED ORDER — METHYLPREDNISOLONE SODIUM SUCC 125 MG IJ SOLR
125.0000 mg | Freq: Once | INTRAMUSCULAR | Status: AC
  Administered 2017-08-13: 125 mg via INTRAVENOUS
  Filled 2017-08-13: qty 2

## 2017-08-13 MED ORDER — ASPIRIN EC 81 MG PO TBEC
81.0000 mg | DELAYED_RELEASE_TABLET | Freq: Every day | ORAL | Status: DC
  Administered 2017-08-13 – 2017-08-18 (×6): 81 mg via ORAL
  Filled 2017-08-13 (×6): qty 1

## 2017-08-13 MED ORDER — ACETAMINOPHEN 325 MG PO TABS
650.0000 mg | ORAL_TABLET | Freq: Four times a day (QID) | ORAL | Status: DC | PRN
Start: 2017-08-13 — End: 2017-08-19
  Administered 2017-08-19: 13:00:00 650 mg via ORAL
  Filled 2017-08-13: qty 2

## 2017-08-13 MED ORDER — OXYCODONE HCL 5 MG PO TABS: 2.5000 mg | ORAL_TABLET | ORAL | Status: DC | PRN

## 2017-08-13 NOTE — H&P (Signed)
[facility] NAME: Stephanie Short    MR#:  [id number]  DATE OF BIRTH:  06/21/1950  DATE OF ADMISSION:  08/13/2017  PRIMARY CARE PHYSICIAN: Kirk Ruths, MD   REQUESTING/REFERRING PHYSICIAN: Cinda Quest  CHIEF COMPLAINT:   Chief Complaint  Patient presents with  . Shortness of Breath    HISTORY OF PRESENT ILLNESS: Stephanie Short  is a 67 y.o. female with a known history of asthma, depression, diabetes, hepatitis C, hypertension, sleep apnea- sister have history of lupus.  Had episode of chest tightness and shortness of breath while she was in [place] last month.  She was admitted to hospital with echocardiogram and stress test negative and she was told she has increased right-sided heart pressure. After coming back she was referred to cardiologist by her primary care.  Seen in cardiologist office and after reviewing all the result he suggested everything looks normal. Patient had also seen pulmonologist in the clinic, and he suggested she has mild COPD.  She was scheduled to have a sleep study done for her sleep apnea. For last few days she has worsening shortness of breath which is with minimal exertion even moving side-to-side while sitting causes her significant shortness of breath. She denies associated chest pain.  She has some swelling on the legs.  She denies any cough or fever. Came to emergency room and noted to be significantly hypoxic and started on IV steroid and nebulizer therapy and BiPAP.  She was able to come off the BiPAP after 1 to 2 hours of treatment, but requiring 6 L of supplemental oxygen so given to hospitalist team for further management.   PAST MEDICAL HISTORY:   Past Medical History:  Diagnosis Date  . Asthma   . Depression   . Diabetes mellitus   . Hepatitis C    Dr. 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 Hypertension   . Insomnia     PAST SURGICAL HISTORY:  Past Surgical History:   Procedure Laterality Date  . BACK SURGERY  2013   [facility] Dr. Mauri Pole  . COLONOSCOPY W/ POLYPECTOMY    . COLONOSCOPY WITH PROPOFOL N/A 03/13/2015   Procedure: COLONOSCOPY WITH PROPOFOL;  Surgeon: Lollie Sails, MD;  Location: [facility] ENDOSCOPY;  Service: Endoscopy;  Laterality: N/A;  . HYSTEROSCOPY W/D&C N/A 08/18/2014   Procedure: DILATATION AND CURETTAGE /HYSTEROSCOPY;  Surgeon: Lorette Ang, MD;  Location: [facility] ORS;  Service: Gynecology;  Laterality: N/A;  . KNEE ARTHROSCOPY Right     SOCIAL HISTORY:  Social History   Tobacco Use  . Smoking status: Former Smoker    Packs/day: 1.00    Years: 20.00    Pack years: 20.00    Last attempt to quit: 01/21/2004    Years since quitting: 13.5  . Smokeless tobacco: Never Used  Substance Use Topics  . Alcohol use: Yes    Alcohol/week: 0.0 - 0.6 oz    Comment: Social    FAMILY HISTORY:  Family History  Problem Relation Age of Onset  . Coronary artery disease Mother 73  . Coronary artery disease Brother 38  . Heart attack Brother        MI  . Heart disease Father   . Diabetes Father   . Diabetes Other   . Hypertension Other   . Colon cancer Other     DRUG ALLERGIES:  Allergies  Allergen Reactions  . Codeine Nausea And Vomiting  . Dilaudid [Hydromorphone  Hcl] Nausea And Vomiting  . Tramadol Nausea And Vomiting and Nausea Only  . Gabapentin Nausea And Vomiting and Other (See Comments)    Reaction:  Dizziness and GI upset  . Hydrocodone-Acetaminophen Nausea And Vomiting and Nausea Only  . Hydromorphone Nausea Only  . Oxycodone-Acetaminophen Nausea Only  . Promethazine Other (See Comments)    Other reaction(s): Other (See Comments) Reaction:  GI upset  Reaction:  GI upset   . Scallops [Shellfish Allergy] Nausea And Vomiting    Other reaction(s): Nausea And Vomiting    REVIEW OF SYSTEMS:   CONSTITUTIONAL: No fever, have fatigue or weakness.  EYES: No blurred or double vision.  EARS, NOSE, AND THROAT: No tinnitus or ear  pain.  RESPIRATORY: No cough, have shortness of breath, wheezing, no hemoptysis.  CARDIOVASCULAR: No chest pain, orthopnea, some edema.  GASTROINTESTINAL: No nausea, vomiting, diarrhea or abdominal pain.  GENITOURINARY: No dysuria, hematuria.  ENDOCRINE: No polyuria, nocturia,  HEMATOLOGY: No anemia, easy bruising or bleeding SKIN: No rash or lesion. MUSCULOSKELETAL: No joint pain or arthritis.   NEUROLOGIC: No tingling, numbness, weakness.  PSYCHIATRY: No anxiety or depression.   MEDICATIONS AT HOME:  Prior to Admission medications   Medication Sig Start Date End Date Taking? Authorizing Provider  acetaminophen (TYLENOL) 500 MG tablet Take 1,000-1,500 mg by mouth every 6 (six) hours as needed for mild pain.    [provider]  albuterol (PROVENTIL HFA;VENTOLIN HFA) 108 (90 Base) MCG/ACT inhaler Inhale 2 puffs into the lungs every 6 (six) hours as needed for wheezing or shortness of breath.    [provider]  ALPRAZolam Duanne Moron) 0.25 MG tablet Take 0.125-0.25 mg by mouth 2 (two) times daily as needed for anxiety or sleep.     [provider]  aspirin EC 81 MG tablet Take 81 mg by mouth at bedtime.    [provider]  Blood Glucose Monitoring Suppl (FIFTY50 GLUCOSE METER 2.0) w/Device KIT Use as directed. Dx E11.22 ONE TOUCH 02/22/16   [provider]  glimepiride (AMARYL) 2 MG tablet Take 2 mg by mouth daily with breakfast.    [provider]  ondansetron (ZOFRAN ODT) 4 MG disintegrating tablet Allow 1-2 tablets to dissolve in your mouth every 8 hours as needed for nausea/vomiting 08/11/17   Vevelyn Francois, NP  ONE TOUCH ULTRA TEST test strip USE TO CHECK BLOOD SUGAR 1-2 TIMES DAILY AS DIRECTED. DX E11.22. 02/22/16   [provider]  oxyCODONE (OXY IR/ROXICODONE) 5 MG immediate release tablet Take 0.5 tablets (2.5 mg total) by mouth every 4 (four) hours as needed for severe pain. 08/29/17 09/28/17  Vevelyn Francois, NP  oxyCODONE (OXY  IR/ROXICODONE) 5 MG immediate release tablet Take 0.5 tablets (2.5 mg total) by mouth every 4 (four) hours as needed for severe pain. 07/30/17 08/29/17  Vevelyn Francois, NP  ranitidine (ZANTAC) 150 MG tablet TAKE 1 TABLET (150 MG TOTAL) BY MOUTH 2 (TWO) TIMES DAILY. AS NEEDED 02/25/16   [provider]  umeclidinium-vilanterol (ANORO ELLIPTA) 62.5-25 MCG/INH AEPB Inhale into the lungs. 07/14/17   [provider]  valsartan (DIOVAN) 80 MG tablet Take 80 mg by mouth daily.    [provider]  vitamin B-12 (CYANOCOBALAMIN) 1000 MCG tablet Take 1 tablet (1,000 mcg total) by mouth daily. 08/11/17   Vevelyn Francois, NP      PHYSICAL EXAMINATION:   VITAL SIGNS: Blood pressure 130/87, pulse 92, temperature 98.2 F (36.8 C), temperature source Oral, resp. rate 18, height  _0  (1.6 m), weight 86.2 kg (190 lb), SpO2 90 %.  GENERAL:  67 y.o.-year-old patient lying in the bed with no acute distress.  EYES: Pupils equal, round, reactive to light and accommodation. No scleral icterus. Extraocular muscles intact.  HEENT: Head atraumatic, normocephalic. Oropharynx and nasopharynx clear.  NECK:  Supple, no jugular venous distention. No thyroid enlargement, no tenderness.  LUNGS: Normal breath sounds bilaterally, no wheezing, b/l diffuse crepitation. Positive for use of accessory muscles of respiration. On 6 ltr oxygen. CARDIOVASCULAR: S1, S2 normal. No murmurs, rubs, or gallops.  ABDOMEN: Soft, nontender, nondistended. Bowel sounds present. No organomegaly or mass.  EXTREMITIES: No pedal edema, cyanosis, or clubbing.  NEUROLOGIC: Cranial nerves II through XII are intact. Muscle strength 5/5 in all extremities. Sensation intact. Gait not checked.  PSYCHIATRIC: The patient is alert and oriented x 3.  SKIN: No obvious rash, lesion, or ulcer.   LABORATORY PANEL:   CBC Recent Labs  Lab 08/13/17 1553  WBC 8.3  HGB 15.6  HCT 46.6  PLT 217  MCV 84.4  MCH 28.2  MCHC 33.5  RDW 16.8*   LYMPHSABS 1.2  MONOABS 0.7  EOSABS 0.1  BASOSABS 0.0   ------------------------------------------------------------------------------------------------------------------  Chemistries  Recent Labs  Lab 08/13/17 1553  NA 140  K 4.3  CL 107  CO2 25  GLUCOSE 197*  BUN 19  CREATININE 1.13*  CALCIUM 9.1  AST 20  ALT 13  ALKPHOS 106  BILITOT 0.6   ------------------------------------------------------------------------------------------------------------------ estimated creatinine clearance is 50.3 mL/min (A) (by C-G formula based on SCr of 1.13 mg/dL (H)). ------------------------------------------------------------------------------------------------------------------ No results for input(s): TSH, T4TOTAL, T3FREE, THYROIDAB in the last 72 hours.  Invalid input(s): FREET3   Coagulation profile No results for input(s): INR, PROTIME in the last 168 hours. ------------------------------------------------------------------------------------------------------------------- No results for input(s): DDIMER in the last 72 hours. -------------------------------------------------------------------------------------------------------------------  Cardiac Enzymes Recent Labs  Lab 08/13/17 1553  TROPONINI <0.03   ------------------------------------------------------------------------------------------------------------------ Invalid input(s): POCBNP  ---------------------------------------------------------------------------------------------------------------  Urinalysis    Component Value Date/Time   COLORURINE Yellow 01/10/2013 2119   COLORURINE YELLOW 2020/06/2707 1417   APPEARANCEUR Clear 01/10/2013 2119   LABSPEC 1.024 01/10/2013 2119   PHURINE 5.0 01/10/2013 2119   PHURINE 7.0 2020/06/2707 1417   GLUCOSEU Negative 01/10/2013 2119   HGBUR Negative 01/10/2013 2119   HGBUR NEGATIVE 2020/06/2707 1417   BILIRUBINUR Negative 01/10/2013 2119   KETONESUR Negative 01/10/2013 2119    KETONESUR NEGATIVE 2020/06/2707 1417   PROTEINUR Negative 01/10/2013 2119   PROTEINUR NEGATIVE 2020/06/2707 1417   UROBILINOGEN 0.2 2020/06/2707 1417   NITRITE Negative 01/10/2013 2119   NITRITE NEGATIVE 2020/06/2707 1417   LEUKOCYTESUR 1+ 01/10/2013 2119     RADIOLOGY: Ct Angio Chest Pe W And/or Wo Contrast  Result Date: 08/13/2017 CLINICAL DATA:  Short of breath EXAM: CT ANGIOGRAPHY CHEST WITH CONTRAST TECHNIQUE: Multidetector CT imaging of the chest was performed using the standard protocol during bolus administration of intravenous contrast. Multiplanar CT image reconstructions and MIPs were obtained to evaluate the vascular anatomy. CONTRAST:  27m ISOVUE-370 IOPAMIDOL (ISOVUE-370) INJECTION 76% COMPARISON:  12/01/2014 FINDINGS: Cardiovascular: There are no filling defects in the pulmonary arterial tree to suggest acute pulmonary thromboembolism. No evidence of aortic dissection or aortic aneurysm. Atherosclerotic calcifications of the aortic arch are noted. Moderate LAD territory coronary artery calcification. Mediastinum/Nodes: Mild mediastinal and hilar adenopathy are noted. 10 mm precarinal node on image 28. 13 mm AP window node on image 28. Subcentimeter short axis diameter prevascular nodes. 15 mm subcarinal node. No pericardial  effusion. Lungs/Pleura: Small bilateral pleural effusions. No pneumothorax. There are patchy areas of panlobular emphysema within the lungs affecting both the upper and lower lung zones. There is scattered interlobular septal thickening throughout the lungs. It is more pronounced towards the lung bases. There is dependent atelectasis associated with the small pleural effusions. Upper Abdomen: No acute abnormality. Small gastrohepatic ligament lymph nodes. Musculoskeletal: No vertebral compression deformity. No obvious acute rib fracture. Review of the MIP images confirms the above findings. IMPRESSION: No evidence of acute pulmonary thromboembolism. There are small pleural  effusions, there is interlobular septal thickening in the lungs, and there is mild mediastinal and hilar adenopathy. These findings may simply represent volume overload. An inflammatory process or even a lymphoproliferative process cannot be excluded. Consider 3 month follow-up to ensure resolution of these findings. Aortic Atherosclerosis (ICD10-I70.0) and Emphysema (ICD10-J43.9). Electronically Signed   By: Marybelle Killings M.D.   On: 08/13/2017 18:26   Dg Chest Portable 1 View  Result Date: 08/13/2017 CLINICAL DATA:  Pt presents to ED c/o SOB worsening over the past month. Was seen at PCP today and noted to have room air O2 sat in the 60s. Hx of asthma, hep C, diabetes, hypetension EXAM: PORTABLE CHEST 1 VIEW COMPARISON:  06/03/2016 FINDINGS: Cardiac silhouette is normal in size. No mediastinal or hilar masses. No evidence of adenopathy. Prominent bronchovascular markings with additional irregularly thickened interstitial markings, the latter present in the lower lungs, increased from the prior study. No lung consolidation. No convincing pleural effusion. No pneumothorax. Skeletal structures are demineralized but grossly intact. IMPRESSION: 1. Increase in interstitial thickening and prominent bronchovascular markings in the lower lungs when compared to the prior study. Findings suggest interstitial edema versus bilateral lower lung zone interstitial infection, former suspected. Electronically Signed   By: Lajean Manes M.D.   On: 08/13/2017 16:21    EKG: Orders placed or performed during the hospital encounter of 08/13/17  . ED EKG  . ED EKG    IMPRESSION AND PLAN:  *Acute hypoxic respiratory failure This could be secondary to combination of diastolic CHF and COPD. Patient have history of hepatitis C and her sister have a history of lupus, we may need to keep that in mind. She is currently requiring 6 L of supplemental oxygen, will try to taper as she can. In any event of worsening, she may go back  to BiPAP or she would like to be a full code.  *Acute diastolic congestive heart failure CT scan chest shows some sign of edema. As per patient she was told in [place], she has elevated right-sided pressure. I will give some IV Lasix and keep on fluid restriction. Cardiology consult for further management.  *COPD exacerbation She does not have much wheezing but have underlying COPD and with her significant respiratory failure I would give her steroid and nebulizer treatment while here.  *Diabetes Keep on glimepiride and sliding scale coverage.  *Hypertension Keep on ARB.  *Anxiety Continue Xanax.   All the records are reviewed and case discussed with ED provider. Management plans discussed with the patient, family and they are in agreement.  CODE STATUS: Full code.   Patient's husband was present in the room during my visit. TOTAL TIME TAKING CARE OF THIS PATIENT: 50 minutes.    Vaughan Basta M.D on 08/13/2017   Between 7am to 6pm - Pager - [phone]  After 6pm go to [email or web address] - password [email or web address] [facility]  Office  [phone]  CC: Primary care physician;  Kirk Ruths, MD   Note: This dictation was prepared with Dragon dictation along with smaller phrase technology. Any transcriptional errors that result from this process are unintentional.

## 2017-08-13 NOTE — ED Notes (Signed)
Pt eating [employer].

## 2017-08-13 NOTE — ED Notes (Signed)
Respiratory called for BIPAP and VBG in lab.

## 2017-08-13 NOTE — ED Notes (Signed)
Pt to and from CT with this nurse and respiratory.

## 2017-08-13 NOTE — ED Notes (Signed)
Admitting at bedside. Pt on 6 L [place] at 91%. Coughing. Family member at bedside.

## 2017-08-13 NOTE — ED Notes (Signed)
RT notified of VBG sent to lab.

## 2017-08-13 NOTE — ED Triage Notes (Signed)
Pt presents to ED c/o SOB worsening over the past month. Was seen at PCP today and noted to have room air O2 sat in the 60s. Pt sat improved only to 90% on 6L via n/c. Denies pain. States hx asthma and COPD. Placed on 100% NRB.

## 2017-08-13 NOTE — Progress Notes (Signed)
Family Meeting Note  Advance Directive: yes  Today a meeting took place with the Patient and husband.  The following clinical team members were present during this meeting:MD  The following were discussed:Patient's diagnosis: CHF, respi failure, COPD, Patient's progosis: Unable to determine and Goals for treatment: Full Code  Additional follow-up to be provided: Cardiologist  Time spent during discussion:20 minutes  Vaughan Basta, MD

## 2017-08-13 NOTE — ED Provider Notes (Signed)
[facility] Emergency Department Provider Note   ____________________________________________   First MD Initiated Contact with Patient 08/13/17 1547     (approximate)  I have reviewed the triage vital signs and the nursing notes.   HISTORY  Chief Complaint Shortness of Breath    HPI Stephanie Short is a 67 y.o. female who reports she has been getting increasingly shortness of breath short of breath over the last month.  She has a cough that is dry nonproductive.  She says when she walks even just 10 or 20 feet she gets burning in her chest worse shortness of breath some nausea.  She went to Dr. Gust Brooms office today and was sent here for O2 sats 60-65 on room air.  She does not usually use oxygen.  6 L put her up to about 90%.  She is 93 on 100%.  She denies any pleuritic chest pain.  She has no swelling in her legs.  She reports last month she had a complete work-up with a stress test echo etc. that was normal.  She says Dr. Ouida Sills told her she has asthma and Dr. [name] said she has COPD.  Past Medical History:  Diagnosis Date  . Asthma   . Depression   . Diabetes mellitus   . Hepatitis C    Dr. 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 Hypertension   . Insomnia     Patient Active Problem List   Diagnosis Date Noted  . Chronic left shoulder pain 03/31/2017  . DDD (degenerative disc disease), cervical 08/29/2016  . Healthcare maintenance 07/30/2016  . Chronic pain syndrome 01/29/2016  . Monoclonal gammopathy 12/21/2015  . Chronic neck pain (3) (B) (R>L) 10/17/2015  . Problem with medical care compliance 05/23/2015  . Poor historian 05/23/2015  . Vitamin B12 deficiency 04/28/2015  . Chronic hip pain(2) (Bilateral) (L>R) 02/01/2015  . Greater trochanteric bursitis of hips (Bilateral) 02/01/2015  . Chronic sacroiliac joint pain (Bilateral) 02/01/2015  . Osteoarthritis of hips (Bilateral) 02/01/2015  . Pain management 02/01/2015  .  Chronic pain of lower extremity  (Bilateral) (L>R) 02/01/2015  . Postmenopausal osteoporosis 02/01/2015  . Chronic cervical radicular pain 02/01/2015  . Neurogenic pain 02/01/2015  . Chronic kidney disease 02/01/2015  . Chronic low back pain (Location of Primary Source of Pain) (Bilateral) (R>L) 01/31/2015  . Lumbar spondylosis 01/31/2015  . 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 Lumbar facet syndrome (Bilateral) (R>L) 01/31/2015  . Long term current use of opiate analgesic 01/31/2015  . Long term prescription opiate use 01/31/2015  . Opiate use (22.5 MME/Day) 01/31/2015  . Encounter for therapeutic drug level monitoring 01/31/2015  . Encounter for chronic pain management 01/31/2015  . Osteopenia 01/31/2015  . Endometrial polyp 08/15/2014  . Hemorrhage, postmenopausal 08/02/2014  . Pure hypercholesterolemia 05/23/2014  . Major depression in remission ([place]) 01/11/2014  . Atherosclerosis of abdominal aorta ([place]) 12/31/2013  . Burning or prickling sensation 06/29/2013  . Airway hyperreactivity 06/12/2013  . Diabetes mellitus with stage 3 chronic kidney disease ([place]) 06/12/2013  . Benign essential hypertension 06/12/2013  . Cannot sleep 06/12/2013  . Asthma 06/12/2013  . HYPERLIPIDEMIA-MIXED 10/29/2009  . AORTIC ATHEROSCLEROSIS 10/29/2009    Past Surgical History:  Procedure Laterality Date  . BACK SURGERY  2013   [facility]. Mauri Pole  . COLONOSCOPY W/ POLYPECTOMY    . COLONOSCOPY WITH PROPOFOL N/A 03/13/2015   Procedure: COLONOSCOPY WITH PROPOFOL;  Surgeon: Lollie Sails, MD;  Location[facility]  ENDOSCOPY;  Service: Endoscopy;  Laterality: N/A;  . HYSTEROSCOPY W/D&C N/A 08/18/2014   Procedure: DILATATION AND CURETTAGE /HYSTEROSCOPY;  Surgeon: Lorette Ang, MD;  Location: [facility] ORS;  Service: Gynecology;  Laterality: N/A;  . KNEE ARTHROSCOPY Right     Prior to Admission medications   Medication Sig Start Date End Date Taking? Authorizing Provider  acetaminophen (TYLENOL) 500  MG tablet Take 1,000-1,500 mg by mouth every 6 (six) hours as needed for mild pain.    [provider]  albuterol (PROVENTIL HFA;VENTOLIN HFA) 108 (90 Base) MCG/ACT inhaler Inhale 2 puffs into the lungs every 6 (six) hours as needed for wheezing or shortness of breath.    [provider]  ALPRAZolam Duanne Moron) 0.25 MG tablet Take 0.125-0.25 mg by mouth 2 (two) times daily as needed for anxiety or sleep.     [provider]  aspirin EC 81 MG tablet Take 81 mg by mouth at bedtime.    [provider]  Blood Glucose Monitoring Suppl (FIFTY50 GLUCOSE METER 2.0) w/Device KIT Use as directed. Dx E11.22 ONE TOUCH 02/22/16   [provider]  glimepiride (AMARYL) 2 MG tablet Take 2 mg by mouth daily with breakfast.    [provider]  ondansetron (ZOFRAN ODT) 4 MG disintegrating tablet Allow 1-2 tablets to dissolve in your mouth every 8 hours as needed for nausea/vomiting 08/11/17   Vevelyn Francois, NP  ONE TOUCH ULTRA TEST test strip USE TO CHECK BLOOD SUGAR 1-2 TIMES DAILY AS DIRECTED. DX E11.22. 02/22/16   [provider]  oxyCODONE (OXY IR/ROXICODONE) 5 MG immediate release tablet Take 0.5 tablets (2.5 mg total) by mouth every 4 (four) hours as needed for severe pain. 08/29/17 09/28/17  Vevelyn Francois, NP  oxyCODONE (OXY IR/ROXICODONE) 5 MG immediate release tablet Take 0.5 tablets (2.5 mg total) by mouth every 4 (four) hours as needed for severe pain. 07/30/17 08/29/17  Vevelyn Francois, NP  ranitidine (ZANTAC) 150 MG tablet TAKE 1 TABLET (150 MG TOTAL) BY MOUTH 2 (TWO) TIMES DAILY. AS NEEDED 02/25/16   [provider]  umeclidinium-vilanterol (ANORO ELLIPTA) 62.5-25 MCG/INH AEPB Inhale into the lungs. 07/14/17   [provider]  valsartan (DIOVAN) 80 MG tablet Take 80 mg by mouth daily.    [provider]  vitamin B-12 (CYANOCOBALAMIN) 1000 MCG tablet Take 1 tablet (1,000 mcg total) by mouth daily. 08/11/17   Vevelyn Francois, NP     Allergies Codeine; Dilaudid [hydromorphone hcl]; Tramadol; Gabapentin; Hydrocodone-acetaminophen; Hydromorphone; Oxycodone-acetaminophen; Promethazine; and [place] [shellfish allergy]  Family History  Problem Relation Age of Onset  . Coronary artery disease Mother 36  . Coronary artery disease Brother 32  . Heart attack Brother        MI  . Heart disease Father   . Diabetes Father   . Diabetes Other   . Hypertension Other   . Colon cancer Other     Social History Social History   Tobacco Use  . Smoking status: Former Smoker    Packs/day: 1.00    Years: 20.00    Pack years: 20.00    Last attempt to quit: 01/21/2004    Years since quitting: 13.5  . Smokeless tobacco: Never Used  Substance Use Topics  . Alcohol use: Yes    Alcohol/week: 0.0 - 0.6 oz    Comment: Social  . Drug use: No    Review of Systems  Constitutional: No fever/chills Eyes: No visual changes. ENT: No sore throat. Cardiovascular: See  HPI Respiratory: See HPI. Gastrointestinal: No abdominal pain.  No nausea, no vomiting.  No diarrhea.  No constipation. Genitourinary: Negative for dysuria. Musculoskeletal: Negative for back pain. Skin: Negative for rash. Neurological: Negative for headaches, focal weakness  ____________________________________________   PHYSICAL EXAM:  VITAL SIGNS: ED Triage Vitals  Enc Vitals Group     BP 08/13/17 1543 110/66     Pulse Rate 08/13/17 1543 (!) 103     Resp 08/13/17 1543 20     Temp 08/13/17 1543 98.2 F (36.8 C)     Temp Source 08/13/17 1543 Oral     SpO2 08/13/17 1543 (!) 89 %     Weight 08/13/17 1545 190 lb (86.2 kg)     Height 08/13/17 1545 _0  (1.6 m)     Head Circumference --      Peak Flow --      Pain Score 08/13/17 1551 0     Pain Loc --      Pain Edu? --      Excl. in [place]? --     Constitutional: Alert and oriented. Well appearing and in no acute distress! Eyes: Conjunctivae are normal. PER Head: Atraumatic. Nose: No  congestion/rhinnorhea. Mouth/Throat: Mucous membranes are moist.  Oropharynx non-erythematous. Neck: No stridor. Cardiovascular: Normal rate, regular rhythm. Grossly normal heart sounds.  Good peripheral circulation. Respiratory: Normal respiratory effort.  No retractions. Lungs tight with faint wheezes Gastrointestinal: Soft and nontender. No distention. No abdominal bruits. No CVA tenderness. Musculoskeletal: No lower extremity tenderness nor edema.   Neurologic:  Normal speech and language. No gross focal neurologic deficits are appreciated. No gait instability. Skin:  Skin is warm, dry and intact. No rash noted. Psychiatric: Mood and affect are normal. Speech and behavior are normal.  ____________________________________________   LABS (all labs ordered are listed, but only abnormal results are displayed)  Labs Reviewed  CBC WITH DIFFERENTIAL/PLATELET - Abnormal; Notable for the following components:      Result Value   RBC 5.53 (*)    RDW 16.8 (*)    All other components within normal limits  COMPREHENSIVE METABOLIC PANEL - Abnormal; Notable for the following components:   Glucose, Bld 197 (*)    Creatinine, Ser 1.13 (*)    Total Protein 8.5 (*)    GFR calc non Af Amer 49 (*)    GFR calc Af Amer 57 (*)    All other components within normal limits  BRAIN NATRIURETIC PEPTIDE - Abnormal; Notable for the following components:   B Natriuretic Peptide 254.0 (*)    All other components within normal limits  FIBRIN DERIVATIVES D-DIMER ([facility] ONLY) - Abnormal; Notable for the following components:   Fibrin derivatives D-dimer ([facility]) 1,350.16 (*)    All other components within normal limits  TROPONIN I  BLOOD GAS, VENOUS   ____________________________________________  EKG  EKG read interpreted by me shows sinus tachycardia rate of 101 normal axis patient has diffuse T wave flattening and flipped T waves in V3 and V4 which are new since May of last  year. ____________________________________________  RADIOLOGY  ED MD interpretation: CT shows no acute disease and there is no obvious pneumonia either.  Official radiology report(s): Ct Angio Chest Pe W And/or Wo Contrast  Result Date: 08/13/2017 CLINICAL DATA:  Short of breath EXAM: CT ANGIOGRAPHY CHEST WITH CONTRAST TECHNIQUE: Multidetector CT imaging of the chest was performed using the standard protocol during bolus administration of intravenous contrast. Multiplanar CT image reconstructions and MIPs were obtained to evaluate  the vascular anatomy. CONTRAST:  1m ISOVUE-370 IOPAMIDOL (ISOVUE-370) INJECTION 76% COMPARISON:  12/01/2014 FINDINGS: Cardiovascular: There are no filling defects in the pulmonary arterial tree to suggest acute pulmonary thromboembolism. No evidence of aortic dissection or aortic aneurysm. Atherosclerotic calcifications of the aortic arch are noted. Moderate LAD territory coronary artery calcification. Mediastinum/Nodes: Mild mediastinal and hilar adenopathy are noted. 10 mm precarinal node on image 28. 13 mm AP window node on image 28. Subcentimeter short axis diameter prevascular nodes. 15 mm subcarinal node. No pericardial effusion. Lungs/Pleura: Small bilateral pleural effusions. No pneumothorax. There are patchy areas of panlobular emphysema within the lungs affecting both the upper and lower lung zones. There is scattered interlobular septal thickening throughout the lungs. It is more pronounced towards the lung bases. There is dependent atelectasis associated with the small pleural effusions. Upper Abdomen: No acute abnormality. Small gastrohepatic ligament lymph nodes. Musculoskeletal: No vertebral compression deformity. No obvious acute rib fracture. Review of the MIP images confirms the above findings. IMPRESSION: No evidence of acute pulmonary thromboembolism. There are small pleural effusions, there is interlobular septal thickening in the lungs, and there is mild  mediastinal and hilar adenopathy. These findings may simply represent volume overload. An inflammatory process or even a lymphoproliferative process cannot be excluded. Consider 3 month follow-up to ensure resolution of these findings. Aortic Atherosclerosis (ICD10-I70.0) and Emphysema (ICD10-J43.9). Electronically Signed   By: AMarybelle KillingsM.D.   On: 08/13/2017 18:26   Dg Chest Portable 1 View  Result Date: 08/13/2017 CLINICAL DATA:  Pt presents to ED c/o SOB worsening over the past month. Was seen at PCP today and noted to have room air O2 sat in the 60s. Hx of asthma, hep C, diabetes, hypetension EXAM: PORTABLE CHEST 1 VIEW COMPARISON:  06/03/2016 FINDINGS: Cardiac silhouette is normal in size. No mediastinal or hilar masses. No evidence of adenopathy. Prominent bronchovascular markings with additional irregularly thickened interstitial markings, the latter present in the lower lungs, increased from the prior study. No lung consolidation. No convincing pleural effusion. No pneumothorax. Skeletal structures are demineralized but grossly intact. IMPRESSION: 1. Increase in interstitial thickening and prominent bronchovascular markings in the lower lungs when compared to the prior study. Findings suggest interstitial edema versus bilateral lower lung zone interstitial infection, former suspected. Electronically Signed   By: DLajean ManesM.D.   On: 08/13/2017 16:21    ____________________________________________   PROCEDURES  Procedure(s) performed:   Procedures  Critical Care performed:   ____________________________________________   INITIAL IMPRESSION / ASSESSMENT AND PLAN / ED COURSE     Patient does not have a fever or white count.  Lungs are much more clear after the nebulizer treatments however when we take her off the BiPAP and put her on room air she desats down to 88 and continues dropping put her on 4 L brought her up to 89 6 L brought her up to 91 I will leave her on 6 L for now we  will admit her to the hospital see if we can improve her further.      ____________________________________________   FINAL CLINICAL IMPRESSION(S) / ED DIAGNOSES  Final diagnoses:  COPD exacerbation ([facility]     ED Discharge Orders    None       Note:  This document was prepared using Dragon voice recognition software and may include unintentional dictation errors.    MNena Polio MD 08/13/17 1840

## 2017-08-13 NOTE — ED Notes (Signed)
Anda Kraft RN, aware of bed assigned

## 2017-08-13 NOTE — ED Triage Notes (Signed)
First Nurse Note:  Arrives from Dr. Joanell Rising office for c/o low O2 sats.  Staff state that patient's initial RA sat was 60-65%.  Placed on 6L/ [place] while at the office.  Arrives to ED, AAOx3.  Skin warm and dry. NAD

## 2017-08-14 ENCOUNTER — Inpatient Hospital Stay
Admit: 2017-08-14 | Discharge: 2017-08-14 | Disposition: A | Discharge status: Home / Self Care | Payer: 59 | Attending: Rehabilitative and Restorative Service Providers" | Admitting: Rehabilitative and Restorative Service Providers"

## 2017-08-14 LAB — BASIC METABOLIC PANEL
Anion gap: 8 (ref 5–15)
BUN: 23 mg/dL (ref 8–23)
CALCIUM: 8.6 mg/dL — AB (ref 8.9–10.3)
CO2: 25 mmol/L (ref 22–32)
CREATININE: 1.18 mg/dL — AB (ref 0.44–1.00)
Chloride: 103 mmol/L (ref 98–111)
GFR calc Af Amer: 54 mL/min — ABNORMAL LOW (ref >60)
GFR, EST NON AFRICAN AMERICAN: 47 mL/min — AB (ref >60)
GLUCOSE: 320 mg/dL — AB (ref 70–99)
Potassium: 4.4 mmol/L (ref 3.5–5.1)
Sodium: 136 mmol/L (ref 135–145)

## 2017-08-14 LAB — CBC
HCT: 45.8 % (ref 35.0–47.0)
Hemoglobin: 15.3 g/dL (ref 12.0–16.0)
MCH: 27.9 pg (ref 26.0–34.0)
MCHC: 33.3 g/dL (ref 32.0–36.0)
MCV: 83.8 fL (ref 80.0–100.0)
PLATELETS: 203 10*3/uL (ref 150–440)
RBC: 5.46 MIL/uL — ABNORMAL HIGH (ref 3.80–5.20)
RDW: 16.5 % — AB (ref 11.5–14.5)
WBC: 5.7 10*3/uL (ref 3.6–11.0)

## 2017-08-14 LAB — GLUCOSE, CAPILLARY
GLUCOSE-CAPILLARY: 299 mg/dL — AB (ref 70–99)
GLUCOSE-CAPILLARY: 356 mg/dL — AB (ref 70–99)
Glucose-Capillary: 296 mg/dL — ABNORMAL HIGH (ref 70–99)
Glucose-Capillary: 306 mg/dL — ABNORMAL HIGH (ref 70–99)
Glucose-Capillary: 261 mg/dL — ABNORMAL HIGH (ref 70–99)

## 2017-08-14 LAB — TROPONIN I

## 2017-08-14 LAB — BRAIN NATRIURETIC PEPTIDE: B Natriuretic Peptide: 309 pg/mL — ABNORMAL HIGH (ref 0.0–100.0)

## 2017-08-14 MED ORDER — IPRATROPIUM-ALBUTEROL 0.5-2.5 (3) MG/3ML IN SOLN
3.0000 mL | Freq: Four times a day (QID) | RESPIRATORY_TRACT | Status: DC
  Administered 2017-08-14 – 2017-08-16 (×6): 3 mL via RESPIRATORY_TRACT
  Filled 2017-08-14 (×6): qty 3

## 2017-08-14 MED ORDER — GLIMEPIRIDE 2 MG PO TABS
4.0000 mg | ORAL_TABLET | Freq: Two times a day (BID) | ORAL | Status: DC
  Administered 2017-08-15 – 2017-08-19 (×10): 4 mg via ORAL
  Filled 2017-08-14: qty 1
  Filled 2017-08-14: qty 2
  Filled 2017-08-14: qty 1
  Filled 2017-08-14: qty 2
  Filled 2017-08-14 (×2): qty 1
  Filled 2017-08-14: qty 2
  Filled 2017-08-14 (×2): qty 1
  Filled 2017-08-14 (×3): qty 2
  Filled 2017-08-14: qty 1
  Filled 2017-08-14 (×2): qty 2
  Filled 2017-08-14 (×3): qty 1
  Filled 2017-08-14: qty 2
  Filled 2017-08-14 (×3): qty 1

## 2017-08-14 MED ORDER — INSULIN ASPART 100 UNIT/ML ~~LOC~~ SOLN
0.0000 [IU] | Freq: Three times a day (TID) | SUBCUTANEOUS | Status: DC
  Administered 2017-08-14: 15 [IU] via SUBCUTANEOUS
  Administered 2017-08-14: 11 [IU] via SUBCUTANEOUS
  Administered 2017-08-15: 13:00:00 15 [IU] via SUBCUTANEOUS
  Administered 2017-08-15: 11 [IU] via SUBCUTANEOUS
  Administered 2017-08-15: 17:00:00 7 [IU] via SUBCUTANEOUS
  Administered 2017-08-16: 15 [IU] via SUBCUTANEOUS
  Administered 2017-08-16: 11 [IU] via SUBCUTANEOUS
  Administered 2017-08-16: 08:00:00 4 [IU] via SUBCUTANEOUS
  Administered 2017-08-17: 20 [IU] via SUBCUTANEOUS
  Administered 2017-08-17: 15 [IU] via SUBCUTANEOUS
  Administered 2017-08-17: 08:00:00 4 [IU] via SUBCUTANEOUS
  Administered 2017-08-18: 7 [IU] via SUBCUTANEOUS
  Administered 2017-08-18: 18:00:00 15 [IU] via SUBCUTANEOUS
  Administered 2017-08-18 – 2017-08-19 (×2): 20 [IU] via SUBCUTANEOUS
  Filled 2017-08-14 (×15): qty 1

## 2017-08-14 MED ORDER — OXYCODONE HCL 5 MG PO TABS: 2.5000 mg | ORAL_TABLET | ORAL | Status: DC | PRN

## 2017-08-14 MED ORDER — INSULIN ASPART 100 UNIT/ML ~~LOC~~ SOLN
0.0000 [IU] | Freq: Every day | SUBCUTANEOUS | Status: DC
  Administered 2017-08-14: 5 [IU] via SUBCUTANEOUS
  Administered 2017-08-14: 02:00:00 3 [IU] via SUBCUTANEOUS
  Administered 2017-08-15: 22:00:00 2 [IU] via SUBCUTANEOUS
  Administered 2017-08-18: 22:00:00 4 [IU] via SUBCUTANEOUS
  Filled 2017-08-14 (×4): qty 1

## 2017-08-14 MED ORDER — ZOLPIDEM TARTRATE 5 MG PO TABS
5.0000 mg | ORAL_TABLET | Freq: Every evening | ORAL | Status: DC | PRN
  Administered 2017-08-14 – 2017-08-19 (×5): 5 mg via ORAL
  Filled 2017-08-14 (×5): qty 1

## 2017-08-14 NOTE — Progress Notes (Signed)
Inpatient Diabetes Program Recommendations  AACE/ADA: New Consensus Statement on Inpatient Glycemic Control (2019)  Target Ranges:  Prepandial:   less than 140 mg/dL      Peak postprandial:   less than 180 mg/dL (1-2 hours)      Critically ill patients:  140 - 180 mg/dL   Results for AVALINE, STILLSON (MRN [id number]) as of 08/14/2017 08:44  Ref. Range 08/13/2017 22:51 08/14/2017 01:32 08/14/2017 08:09  Glucose-Capillary Latest Ref Range: 70 - 99 mg/dL 349 (H) 296 (H) 306 (H)   Review of Glycemic Control  Diabetes history: DM2 Outpatient Diabetes medications: Amaryl 2 mg QAM Current orders for Inpatient glycemic control: Amaryl 2 mg QAM, Novolog 0-20 units TID with meals, Novolog 0-5 units QHS; Solumedrol 60 mg Q6H  Inpatient Diabetes Program Recommendations: Insulin - Basal: If steroids are continued, please consider ordering Lantus 10 units daily. Please note that if Lantus is ordered as recommended, it will need to be adjusted as steroids are tapered.  Thanks, Barnie Alderman, RN, MSN, CDE Diabetes Coordinator Inpatient Diabetes Program [phone] (Team Pager from 8am to 5pm)

## 2017-08-14 NOTE — Progress Notes (Signed)
RT Note:  Patient arrived to floor couple of hours ago from ed sob with sats in 80's on 6liters.  Patient was placed on HFNC at 10 liters and has done well since along with the lasix, svns, and other meds from RN.  BBS diminished with faint exp wheezes noted. Patient was to have titration study for cpap but ended up admitted to hospital. Since patient will be up and down throughout night due to lasix cpap on standby at this time. Patient remains on HFNC 10liters. Saturation noted 93%. Will continue to monitor.

## 2017-08-14 NOTE — Consult Note (Signed)
[facility] Cardiology  CARDIOLOGY CONSULT NOTE  Patient ID: Stephanie Short MRN: [id number] DOB/AGE: Sep 14, 1950 67 y.o.  Admit date: 08/13/2017 Referring Physician Dr. Boyce Medici  Primary Physician Dr. Ouida Sills Primary Cardiologist Dr. Clayborn Bigness Reason for Consultation Respiratory Failure   HPI: Stephanie Short is a 67 year old female with past medical history significant for hypertension, sleep apnea, asthma, hepatitis C, and diabetes who presented to the ED on 08/13/2017 for severe shortness of breath.  She states that she was unable to take even a few steps before she got really short of breath.  She states that she saw Dr. Vella Kohler yesterday and pulse ox noted that she was satting at 61%, which warranted an ED visit. Last month, she had an episode of severe shortness of breath and chest tightness when she was in [place].  She was admitted to the hospital and had a negative stress test and echocardiogram.  She established care with Dr. Clayborn Bigness on 07/31/2017 and he assured her that the test results done in [place] looked normal.  She was also recently diagnosed with sleep apnea, but is awaiting the arrival of CPAP machine.  Today, the patient reports that she is feeling a lot better.  She is currently on 6 L of oxygen, but has much improvement in her breathing since yesterday.  She denies chest pain or lower extremity swelling, she does have mild shortness of breath and a history of palpitations.  She denies any syncopal episodes, but does have dizziness when she is short of breath.  She recently established care with Dr. Clayborn Bigness at [facility] cardiology.  Chemical stress test was unremarkable.  Echo showed normal LV function with moderate TR.   Review of systems complete and found to be negative unless listed above     Past Medical History:  Diagnosis Date  . Asthma   . Depression   . Diabetes mellitus   . Hepatitis C    Dr. 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 Hypertension   . Insomnia      Past Surgical History:  Procedure Laterality Date  . BACK SURGERY  2013   [facility] Dr. Mauri Pole  . COLONOSCOPY W/ POLYPECTOMY    . COLONOSCOPY WITH PROPOFOL N/A 03/13/2015   Procedure: COLONOSCOPY WITH PROPOFOL;  Surgeon: Lollie Sails, MD;  Location: [facility] ENDOSCOPY;  Service: Endoscopy;  Laterality: N/A;  . HYSTEROSCOPY W/D&C N/A 08/18/2014   Procedure: DILATATION AND CURETTAGE /HYSTEROSCOPY;  Surgeon: Lorette Ang, MD;  Location: [facility] ORS;  Service: Gynecology;  Laterality: N/A;  . KNEE ARTHROSCOPY Right     Medications Prior to Admission  Medication Sig Dispense Refill Last Dose  . acetaminophen (TYLENOL) 500 MG tablet Take 1,000-1,500 mg by mouth every 6 (six) hours as needed for mild pain.   PRN at PRN  . albuterol (PROVENTIL HFA;VENTOLIN HFA) 108 (90 Base) MCG/ACT inhaler Inhale 2 puffs into the lungs every 6 (six) hours as needed for wheezing or shortness of breath.   PRN at PRN  . ALPRAZolam (XANAX) 0.25 MG tablet Take 0.125-0.25 mg by mouth 2 (two) times daily as needed for anxiety or sleep.    PRN at PRN  . aspirin EC 81 MG tablet Take 81 mg by mouth at bedtime.   08/12/2017 at 2000  . Blood Glucose Monitoring Suppl (FIFTY50 GLUCOSE METER 2.0) w/Device KIT Use as directed. Dx E11.22 ONE TOUCH   Taking  . carvedilol (COREG) 6.25 MG tablet Take 6.25 mg by mouth 2 (two) times daily with  a meal.   08/12/2017 at 0800  . felodipine (PLENDIL) 10 MG 24 hr tablet Take 10 mg by mouth every morning.  8 08/13/2017 at 0800  . glimepiride (AMARYL) 2 MG tablet Take 2 mg by mouth daily with breakfast.   08/12/2017 at 0700  . ondansetron (ZOFRAN ODT) 4 MG disintegrating tablet Allow 1-2 tablets to dissolve in your mouth every 8 hours as needed for nausea/vomiting 30 tablet 0 PRN at PRN  . ONE TOUCH ULTRA TEST test strip USE TO CHECK BLOOD SUGAR 1-2 TIMES DAILY AS DIRECTED. DX E11.22.  5 Taking  . [START ON 08/29/2017] oxyCODONE (OXY IR/ROXICODONE) 5 MG immediate release tablet Take 0.5 tablets (2.5  mg total) by mouth every 4 (four) hours as needed for severe pain. 90 tablet 0 PRN at PRN  . ranitidine (ZANTAC) 150 MG tablet TAKE 1 TABLET (150 MG TOTAL) BY MOUTH 2 (TWO) TIMES DAILY. AS NEEDED  11 08/12/2017 at 1800  . umeclidinium-vilanterol (ANORO ELLIPTA) 62.5-25 MCG/INH AEPB Inhale 1 puff into the lungs daily.    08/12/2017 at 2000  . vitamin B-12 (CYANOCOBALAMIN) 1000 MCG tablet Take 1 tablet (1,000 mcg total) by mouth daily. 30 tablet 1 08/12/2017 at 0800  . zolpidem (AMBIEN) 5 MG tablet Take 5 mg by mouth at bedtime as needed for sleep.   PRN at PRN  . oxyCODONE (OXY IR/ROXICODONE) 5 MG immediate release tablet Take 0.5 tablets (2.5 mg total) by mouth every 4 (four) hours as needed for severe pain. (Patient not taking: Reported on 08/13/2017) 90 tablet 0 Not Taking at Unknown time   Social History   Socioeconomic History  . Marital status: Married    Spouse name: Not on file  . Number of children: Not on file  . Years of education: Not on file  . Highest education level: Not on file  Occupational History  . Occupation: Full time  Social Needs  . Financial resource strain: Not on file  . Food insecurity:    Worry: Not on file    Inability: Not on file  . Transportation needs:    Medical: Not on file    Non-medical: Not on file  Tobacco Use  . Smoking status: Former Smoker    Packs/day: 1.00    Years: 20.00    Pack years: 20.00    Last attempt to quit: 01/21/2004    Years since quitting: 13.5  . Smokeless tobacco: Never Used  Substance and Sexual Activity  . Alcohol use: Yes    Alcohol/week: 0.0 - 0.6 oz    Comment: Social  . Drug use: No  . Sexual activity: Not on file  Lifestyle  . Physical activity:    Days per week: Not on file    Minutes per session: Not on file  . Stress: Not on file  Relationships  . Social connections:    Talks on phone: Not on file    Gets together: Not on file    Attends religious service: Not on file    Active member of club or  organization: Not on file    Attends meetings of clubs or organizations: Not on file    Relationship status: Not on file  . Intimate partner violence:    Fear of current or ex partner: Not on file    Emotionally abused: Not on file    Physically abused: Not on file    Forced sexual activity: Not on file  Other Topics Concern  . Not on file  Social History Narrative   Does not get regular exercise    Family History  Problem Relation Age of Onset  . Coronary artery disease Mother 81  . Coronary artery disease Brother 31  . Heart attack Brother        MI  . Heart disease Father   . Diabetes Father   . Diabetes Other   . Hypertension Other   . Colon cancer Other       Review of systems complete and found to be negative unless listed above      PHYSICAL EXAM  General: Well developed, well nourished.  Sitting up comfortably, on 6 L of oxygen HEENT:  Normocephalic and atramatic Neck:  No JVD.  Lungs: Clear bilaterally to auscultation and percussion. Heart: HRRR . Normal S1 and S2 without gallops or murmurs.  Abdomen: Bowel sounds are positive, abdomen soft and non-tender  Msk:  Back normal. Normal strength and tone for age.  Gait not assessed Extremities: No clubbing, cyanosis or edema.  Pulses 2+ bilaterally Neuro: Alert and oriented X 3. Psych:  Good affect, responds appropriately  Labs:   Lab Results  Component Value Date   WBC 5.7 08/14/2017   HGB 15.3 08/14/2017   HCT 45.8 08/14/2017   MCV 83.8 08/14/2017   PLT 203 08/14/2017    Recent Labs  Lab 08/13/17 1553 08/14/17 0332  NA 140 136  K 4.3 4.4  CL 107 103  CO2 25 25  BUN 19 23  CREATININE 1.13* 1.18*  CALCIUM 9.1 8.6*  PROT 8.5*  --   BILITOT 0.6  --   ALKPHOS 106  --   ALT 13  --   AST 20  --   GLUCOSE 197* 320*   Lab Results  Component Value Date   CKTOTAL 56 01/10/2013   CKMB < 0.5 (L) 01/10/2013   TROPONINI <0.03 08/14/2017    Lab Results  Component Value Date   CHOL 82 05/24/2012    CHOL  02/07/2009    104        ATP III CLASSIFICATION:  <200     mg/dL   Desirable  200-239  mg/dL   Borderline High  >=240    mg/dL   High          Lab Results  Component Value Date   HDL 31 (L) 05/24/2012   HDL 45 02/07/2009   Lab Results  Component Value Date   LDLCALC 38 05/24/2012   [place]  02/07/2009    48        Total Cholesterol/HDL:CHD Risk Coronary Heart Disease Risk Table                     Men   Women  1/2 Average Risk   3.4   3.3  Average Risk       5.0   4.4  2 X Average Risk   9.6   7.1  3 X Average Risk  23.4   11.0        Use the calculated Patient Ratio above and the CHD Risk Table to determine the patient's CHD Risk.        ATP III CLASSIFICATION (LDL):  <100     mg/dL   Optimal  100-129  mg/dL   Near or Above                    Optimal  130-159  mg/dL   Borderline  160-189  mg/dL  High  >190     mg/dL   Very High   Lab Results  Component Value Date   TRIG 67 05/24/2012   TRIG 56 02/07/2009   Lab Results  Component Value Date   CHOLHDL 2.3 02/07/2009   No results found for: LDLDIRECT    Radiology: Ct Angio Chest Pe W And/or Wo Contrast  Result Date: 08/13/2017 CLINICAL DATA:  Short of breath EXAM: CT ANGIOGRAPHY CHEST WITH CONTRAST TECHNIQUE: Multidetector CT imaging of the chest was performed using the standard protocol during bolus administration of intravenous contrast. Multiplanar CT image reconstructions and MIPs were obtained to evaluate the vascular anatomy. CONTRAST:  42m ISOVUE-370 IOPAMIDOL (ISOVUE-370) INJECTION 76% COMPARISON:  12/01/2014 FINDINGS: Cardiovascular: There are no filling defects in the pulmonary arterial tree to suggest acute pulmonary thromboembolism. No evidence of aortic dissection or aortic aneurysm. Atherosclerotic calcifications of the aortic arch are noted. Moderate LAD territory coronary artery calcification. Mediastinum/Nodes: Mild mediastinal and hilar adenopathy are noted. 10 mm precarinal node on image  28. 13 mm AP window node on image 28. Subcentimeter short axis diameter prevascular nodes. 15 mm subcarinal node. No pericardial effusion. Lungs/Pleura: Small bilateral pleural effusions. No pneumothorax. There are patchy areas of panlobular emphysema within the lungs affecting both the upper and lower lung zones. There is scattered interlobular septal thickening throughout the lungs. It is more pronounced towards the lung bases. There is dependent atelectasis associated with the small pleural effusions. Upper Abdomen: No acute abnormality. Small gastrohepatic ligament lymph nodes. Musculoskeletal: No vertebral compression deformity. No obvious acute rib fracture. Review of the MIP images confirms the above findings. IMPRESSION: No evidence of acute pulmonary thromboembolism. There are small pleural effusions, there is interlobular septal thickening in the lungs, and there is mild mediastinal and hilar adenopathy. These findings may simply represent volume overload. An inflammatory process or even a lymphoproliferative process cannot be excluded. Consider 3 month follow-up to ensure resolution of these findings. Aortic Atherosclerosis (ICD10-I70.0) and Emphysema (ICD10-J43.9). Electronically Signed   By: AMarybelle KillingsM.D.   On: 08/13/2017 18:26   Dg Chest Portable 1 View  Result Date: 08/13/2017 CLINICAL DATA:  Pt presents to ED c/o SOB worsening over the past month. Was seen at PCP today and noted to have room air O2 sat in the 60s. Hx of asthma, hep C, diabetes, hypetension EXAM: PORTABLE CHEST 1 VIEW COMPARISON:  06/03/2016 FINDINGS: Cardiac silhouette is normal in size. No mediastinal or hilar masses. No evidence of adenopathy. Prominent bronchovascular markings with additional irregularly thickened interstitial markings, the latter present in the lower lungs, increased from the prior study. No lung consolidation. No convincing pleural effusion. No pneumothorax. Skeletal structures are demineralized but  grossly intact. IMPRESSION: 1. Increase in interstitial thickening and prominent bronchovascular markings in the lower lungs when compared to the prior study. Findings suggest interstitial edema versus bilateral lower lung zone interstitial infection, former suspected. Electronically Signed   By: DLajean ManesM.D.   On: 08/13/2017 16:21    EKG: Sinus tachycardia, possible left atrial enlargement Vent rate: 101, PR interval: 1577m QRS duration: 7648mQT/QTc: 368/477m21mSSESSMENT AND PLAN:  1.  Acute hypoxic respiratory failure  -Currently on 6 L of oxygen, will taper as aerated  -Could be secondary to COPD or other underlying lung issue  -Continue follow-up with pulmonology  -Echo with bubble study ordered; further plan pending results  -Consider right heart cath 2.  Acute diastolic congestive heart failure  -Continue IV Lasix and fluid restriction  -  CT scan of the chest showed signs of mild edema 3.  Diabetes  -Continue insulin scale and glimepiride 4.  Hypertension  -Continue carvedilol 6.54m twice daily, felodipine 10 mg once daily irbesartan 37.5 mg once daily   The history, physical exam findings, and plan of care were all discussed with Dr. KChrissie Noafact and all decision making was made in collaboration.   Signed: NAvie ArenasPA-C 08/14/2017, 1:39 PM

## 2017-08-14 NOTE — Plan of Care (Signed)
  Problem: Education: Goal: Knowledge of disease or condition will improve Outcome: Progressing Goal: Knowledge of the prescribed therapeutic regimen will improve Outcome: Progressing Goal: Individualized Educational Video(s) Outcome: Progressing   Problem: Respiratory: Goal: Ability to maintain a clear airway will improve Outcome: Progressing Goal: Levels of oxygenation will improve Outcome: Progressing Goal: Ability to maintain adequate ventilation will improve Outcome: Progressing   Problem: Education: Goal: Knowledge of General Education information will improve Description Including pain rating scale, medication(s)/side effects and non-pharmacologic comfort measures Outcome: Progressing   Problem: Clinical Measurements: Goal: Ability to maintain clinical measurements within normal limits will improve Outcome: Progressing Goal: Will remain free from infection Outcome: Progressing   Problem: Pain Managment: Goal: General experience of comfort will improve Outcome: Progressing   Problem: Safety: Goal: Ability to remain free from injury will improve Outcome: Progressing   Problem: Skin Integrity: Goal: Risk for impaired skin integrity will decrease Outcome: Progressing

## 2017-08-14 NOTE — Progress Notes (Signed)
Patient has been off HFNC today now on 5 liter nasal cannula tolerating well. Patient has stating she is not going to wear cpap tonight. Supplies on standby. Will have RN to call if she changes her mind

## 2017-08-14 NOTE — Progress Notes (Signed)
*  PRELIMINARY RESULTS* Echocardiogram 2D Echocardiogram has been performed.  Stephanie Short 08/14/2017, 12:04 PM

## 2017-08-14 NOTE — Progress Notes (Signed)
[place] [facility] [place] NAME: Stephanie Short    MR#:  [id number]  DATE OF BIRTH:  Dec 15, 1950  SUBJECTIVE:  CHIEF COMPLAINT:   Chief Complaint  Patient presents with  . Shortness of Breath   With worsening shortness of breath and requiring high oxygen. Still on 6 L oxygen.  But feels much better today than yesterday.  REVIEW OF SYSTEMS:  CONSTITUTIONAL: No fever, fatigue or weakness.  EYES: No blurred or double vision.  EARS, NOSE, AND THROAT: No tinnitus or ear pain.  RESPIRATORY: No cough, have shortness of breath, no wheezing or hemoptysis.  CARDIOVASCULAR: No chest pain, orthopnea, edema.  GASTROINTESTINAL: No nausea, vomiting, diarrhea or abdominal pain.  GENITOURINARY: No dysuria, hematuria.  ENDOCRINE: No polyuria, nocturia,  HEMATOLOGY: No anemia, easy bruising or bleeding SKIN: No rash or lesion. MUSCULOSKELETAL: No joint pain or arthritis.   NEUROLOGIC: No tingling, numbness, weakness.  PSYCHIATRY: No anxiety or depression.   ROS  DRUG ALLERGIES:   Allergies  Allergen Reactions  . Codeine Nausea And Vomiting  . Dilaudid [Hydromorphone Hcl] Nausea And Vomiting  . Tramadol Nausea And Vomiting and Nausea Only  . Gabapentin Nausea And Vomiting and Other (See Comments)    Reaction:  Dizziness and GI upset  . Hydrocodone-Acetaminophen Nausea And Vomiting and Nausea Only  . Hydromorphone Nausea Only  . Oxycodone-Acetaminophen Nausea Only  . Promethazine Other (See Comments)    Other reaction(s): Other (See Comments) Reaction:  GI upset  Reaction:  GI upset   . Scallops [Shellfish Allergy] Nausea And Vomiting    Other reaction(s): Nausea And Vomiting    VITALS:  Blood pressure (!) 143/92, pulse (!) 107, temperature 98.4 F (36.9 C), temperature source Oral, resp. rate 16, height _0  (1.6 m), weight 92.2 kg (203 lb 3.2 oz), SpO2 91 %.  PHYSICAL EXAMINATION:  GENERAL:  67 y.o.-year-old patient lying in the bed with no  acute distress.  EYES: Pupils equal, round, reactive to light and accommodation. No scleral icterus. Extraocular muscles intact.  HEENT: Head atraumatic, normocephalic. Oropharynx and nasopharynx clear.  NECK:  Supple, no jugular venous distention. No thyroid enlargement, no tenderness.  LUNGS: Normal breath sounds bilaterally, no wheezing, have crepitation. No use of accessory muscles of respiration.  On 6 L oxygen. CARDIOVASCULAR: S1, S2 normal. No murmurs, rubs, or gallops.  ABDOMEN: Soft, nontender, nondistended. Bowel sounds present. No organomegaly or mass.  EXTREMITIES: No pedal edema, cyanosis, or clubbing.  NEUROLOGIC: Cranial nerves II through XII are intact. Muscle strength 5/5 in all extremities. Sensation intact. Gait not checked.  PSYCHIATRIC: The patient is alert and oriented x 3.  SKIN: No obvious rash, lesion, or ulcer.   Physical Exam LABORATORY PANEL:   CBC Recent Labs  Lab 08/14/17 0332  WBC 5.7  HGB 15.3  HCT 45.8  PLT 203   ------------------------------------------------------------------------------------------------------------------  Chemistries  Recent Labs  Lab 08/13/17 1553 08/14/17 0332  NA 140 136  K 4.3 4.4  CL 107 103  CO2 25 25  GLUCOSE 197* 320*  BUN 19 23  CREATININE 1.13* 1.18*  CALCIUM 9.1 8.6*  AST 20  --   ALT 13  --   ALKPHOS 106  --   BILITOT 0.6  --    ------------------------------------------------------------------------------------------------------------------  Cardiac Enzymes Recent Labs  Lab 08/13/17 2312 08/14/17 0332  TROPONINI <0.03 <0.03   ------------------------------------------------------------------------------------------------------------------  RADIOLOGY:  Ct Angio Chest Pe W And/or Wo Contrast  Result Date: 08/13/2017 CLINICAL DATA:  Short of  breath EXAM: CT ANGIOGRAPHY CHEST WITH CONTRAST TECHNIQUE: Multidetector CT imaging of the chest was performed using the standard protocol during bolus  administration of intravenous contrast. Multiplanar CT image reconstructions and MIPs were obtained to evaluate the vascular anatomy. CONTRAST:  2m ISOVUE-370 IOPAMIDOL (ISOVUE-370) INJECTION 76% COMPARISON:  12/01/2014 FINDINGS: Cardiovascular: There are no filling defects in the pulmonary arterial tree to suggest acute pulmonary thromboembolism. No evidence of aortic dissection or aortic aneurysm. Atherosclerotic calcifications of the aortic arch are noted. Moderate LAD territory coronary artery calcification. Mediastinum/Nodes: Mild mediastinal and hilar adenopathy are noted. 10 mm precarinal node on image 28. 13 mm AP window node on image 28. Subcentimeter short axis diameter prevascular nodes. 15 mm subcarinal node. No pericardial effusion. Lungs/Pleura: Small bilateral pleural effusions. No pneumothorax. There are patchy areas of panlobular emphysema within the lungs affecting both the upper and lower lung zones. There is scattered interlobular septal thickening throughout the lungs. It is more pronounced towards the lung bases. There is dependent atelectasis associated with the small pleural effusions. Upper Abdomen: No acute abnormality. Small gastrohepatic ligament lymph nodes. Musculoskeletal: No vertebral compression deformity. No obvious acute rib fracture. Review of the MIP images confirms the above findings. IMPRESSION: No evidence of acute pulmonary thromboembolism. There are small pleural effusions, there is interlobular septal thickening in the lungs, and there is mild mediastinal and hilar adenopathy. These findings may simply represent volume overload. An inflammatory process or even a lymphoproliferative process cannot be excluded. Consider 3 month follow-up to ensure resolution of these findings. Aortic Atherosclerosis (ICD10-I70.0) and Emphysema (ICD10-J43.9). Electronically Signed   By: AMarybelle KillingsM.D.   On: 08/13/2017 18:26   Dg Chest Portable 1 View  Result Date: 08/13/2017 CLINICAL  DATA:  Pt presents to ED c/o SOB worsening over the past month. Was seen at PCP today and noted to have room air O2 sat in the 60s. Hx of asthma, hep C, diabetes, hypetension EXAM: PORTABLE CHEST 1 VIEW COMPARISON:  06/03/2016 FINDINGS: Cardiac silhouette is normal in size. No mediastinal or hilar masses. No evidence of adenopathy. Prominent bronchovascular markings with additional irregularly thickened interstitial markings, the latter present in the lower lungs, increased from the prior study. No lung consolidation. No convincing pleural effusion. No pneumothorax. Skeletal structures are demineralized but grossly intact. IMPRESSION: 1. Increase in interstitial thickening and prominent bronchovascular markings in the lower lungs when compared to the prior study. Findings suggest interstitial edema versus bilateral lower lung zone interstitial infection, former suspected. Electronically Signed   By: DLajean ManesM.D.   On: 08/13/2017 16:21    ASSESSMENT AND PLAN:   Principal Problem:   Acute respiratory failure with hypoxia (HCC) Active Problems:   COPD with acute exacerbation (HCC)   Acute diastolic CHF (congestive heart failure) (HCC)   Acute respiratory failure (HCC)   *Acute hypoxic respiratory failure This could be secondary to combination of diastolic CHF and COPD. Patient have history of hepatitis C and her sister have a history of lupus, we may need to keep that in mind. She is currently requiring 6 L of supplemental oxygen, will try to taper as she can. In any event of worsening, she may go back to BiPAP or she would like to be a full code.  *Acute diastolic congestive heart failure CT scan chest shows some sign of edema. [place]. IV Lasix and keep on fluid restriction. Cardiology consult for further management. Advised echocardiogram with bubble  study.  *COPD exacerbation She does not have much wheezing but  have underlying COPD and with her significant respiratory failure I would give her steroid and nebulizer treatment while here. Appreciated pulmonary consult, thinking she has pulmonary hypertension due to sleep apnea.  *Diabetes Keep on glimepiride and sliding scale coverage.  *Hypertension Keep on ARB.  *Anxiety Continue Xanax.   All the records are reviewed and case discussed with Care Management/Social Workerr. Management plans discussed with the patient, family and they are in agreement.  CODE STATUS: Full.  TOTAL TIME TAKING CARE OF THIS PATIENT: 50 minutes.     POSSIBLE D/C IN 1-2 DAYS, DEPENDING ON CLINICAL CONDITION.   Vaughan Basta M.D on 08/14/2017   Between 7am to 6pm - Pager - [phone]  After 6pm go to [email or web address] - password [email or web address] [facility]  Office  [phone]  CC: Primary care physician; Kirk Ruths, MD  Note: This dictation was prepared with Dragon dictation along with smaller phrase technology. Any transcriptional errors that result from this process are unintentional.

## 2017-08-14 NOTE — Progress Notes (Signed)
Date: 08/14/2017,   MRN# [id number] Stephanie Short 27-Apr-1950 Code Status:     Code Status Orders  (From admission, onward)        Start     Ordered   08/13/17 2051  Full code  Continuous     08/13/17 2050    Code Status History    This patient has a current code status but no historical code status.     Hosp day:_0 @ Referring MD: _1 @       CC: For sob and hypoxia  HPI: This is 67 year old lady whom I recently saw for shortness of breath at [facility]. She is sob with activity. She carries the dx of asthma. Due to wheezing.  Especially going up stairs. Vacuuming is difficult. She denies chest pain, leg edema or calf pain. No prior hx pe/dvt, no prior usage of Fen phen. ( no hx of diet pills). No pets. She did smoke for at least 20 years ( 1/2/pk a day). 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   No family hx of copd/asthma. Her brother died from sarcoidosis. No pets. + gerd ( rarely), - rhinitis today. No mold exposure. Not sleeping on feathered.   Of note she was admitted three weeks ago in [place] (prior to that she was in the [place]). She mentioned she was having heaviness in her chest and nausea. She had a pulmonary - cardiac w/u. Chemical stress test was - ve. ECHO showed good LVF, Iincrease rvsp with moderate TR.    She is over weight, + moderate obstruction on spirometry c/w copd vs long standing asthma. I suspected she may have obstructive sleep apnea. Hence sleep study was ordered  I recommenended    -Weight loss -Anoro one puff q day d/c advair and spiriva -Continue qvar and prn albuterol -Cardiology follow up visit, consider right heart cath -Home sleep study  She returned to [place]  one month later with the sudden onset of shortness of breath, some wheezing, sats as low as 61 % on room air. Hence she was transferred to te ER, worked up and transferred to the floor. Chest ct did not show pulmonary embolism.     PMHX:   Past Medical History:   Diagnosis Date  . Asthma   . Depression   . Diabetes mellitus   . Hepatitis C    Dr. 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 Hypertension   . Insomnia    Surgical Hx:  Past Surgical History:  Procedure Laterality Date  . BACK SURGERY  2013   [facility] Dr. Mauri Pole  . COLONOSCOPY W/ POLYPECTOMY    . COLONOSCOPY WITH PROPOFOL N/A 03/13/2015   Procedure: COLONOSCOPY WITH PROPOFOL;  Surgeon: Lollie Sails, MD;  Location: [facility] ENDOSCOPY;  Service: Endoscopy;  Laterality: N/A;  . HYSTEROSCOPY W/D&C N/A 08/18/2014   Procedure: DILATATION AND CURETTAGE /HYSTEROSCOPY;  Surgeon: Lorette Ang, MD;  Location: [facility] ORS;  Service: Gynecology;  Laterality: N/A;  . KNEE ARTHROSCOPY Right    Family Hx:  Family History  Problem Relation Age of Onset  . Coronary artery disease Mother 53  . Coronary artery disease Brother 44  . Heart attack Brother        MI  . Heart disease Father   . Diabetes Father   . Diabetes Other   . Hypertension Other   . Colon cancer Other    Social Hx:   Social History   Tobacco Use  . Smoking status:  Former Smoker    Packs/day: 1.00    Years: 20.00    Pack years: 20.00    Last attempt to quit: 01/21/2004    Years since quitting: 13.5  . Smokeless tobacco: Never Used  Substance Use Topics  . Alcohol use: Yes    Alcohol/week: 0.0 - 0.6 oz    Comment: Social  . Drug use: No   Medication:    Home Medication:    Current Medication: _0 @   Allergies:  Codeine; Dilaudid [hydromorphone hcl]; Tramadol; Gabapentin; Hydrocodone-acetaminophen; Hydromorphone; Oxycodone-acetaminophen; Promethazine; and Scallops [shellfish allergy]  Review of Systems: Gen:  Denies  fever, sweats, chills HEENT: Denies blurred vision, double vision, ear pain, eye pain, hearing loss, nose bleeds, sore throat Cvc:  No dizziness, chest pain or heaviness Resp:  + sob, mils cough. No pleurisy, hemoptysis  Gi: Denies swallowing difficulty, stomach pain, nausea or  vomiting, diarrhea, constipation, bowel incontinence Gu:  Denies bladder incontinence, burning urine Ext:   No Joint pain, stiffness or swelling Skin: No skin rash, easy bruising or bleeding or hives Endoc:  No polyuria, polydipsia , polyphagia or weight change Psych: No depression, insomnia or hallucinations  Other:  All other systems negative  Physical Examination:   VS: BP 135/90 (BP Location: Right Arm)   Pulse 98   Temp 97.8 F (36.6 C) (Oral)   Resp 16   Ht _1  (1.6 m)   Wt 92.2 kg (203 lb 3.2 oz)   SpO2 94%   BMI 36.00 kg/m   General Appearance: No distress, 5 liters Auxier 02 is on  Neuro: without focal findings, mental status, speech normal, alert and oriented, cranial nerves 2-12 intact, reflexes normal and symmetric, sensation grossly normal  HEENT: PERRLA, EOM intact, no ptosis, no other lesions NECK: Supple, no jvd or stridor Pulmonary:.No wheezing, No rales  Sputum Production:   Cardiovascular:  Normal S1,S2.  No m/r/g.  Abdominal aorta pulsation normal.    Abdomen:Benign, Soft, non-tender, No masses, hepatosplenomegaly, No lymphadenopathy Endoc: No evident thyromegaly, no signs of acromegaly or Cushing features Skin:   warm, no rashes, no ecchymosis  Extremities: normal, no cyanosis, clubbing, no edema, warm with normal capillary refill. Other findings:   Labs results:   Recent Labs    08/13/17 1553 08/14/17 0332  HGB 15.6 15.3  HCT 46.6 45.8  MCV 84.4 83.8  WBC 8.3 5.7  BUN 19 23  CREATININE 1.13* 1.18*  GLUCOSE 197* 320*  CALCIUM 9.1 8.6*  ,       Rad results:   Ct Angio Chest Pe W And/or Wo Contrast  Result Date: 08/13/2017 CLINICAL DATA:  Short of breath EXAM: CT ANGIOGRAPHY CHEST WITH CONTRAST TECHNIQUE: Multidetector CT imaging of the chest was performed using the standard protocol during bolus administration of intravenous contrast. Multiplanar CT image reconstructions and MIPs were obtained to evaluate the vascular anatomy. CONTRAST:  66m  ISOVUE-370 IOPAMIDOL (ISOVUE-370) INJECTION 76% COMPARISON:  12/01/2014 FINDINGS: Cardiovascular: There are no filling defects in the pulmonary arterial tree to suggest acute pulmonary thromboembolism. No evidence of aortic dissection or aortic aneurysm. Atherosclerotic calcifications of the aortic arch are noted. Moderate LAD territory coronary artery calcification. Mediastinum/Nodes: Mild mediastinal and hilar adenopathy are noted. 10 mm precarinal node on image 28. 13 mm AP window node on image 28. Subcentimeter short axis diameter prevascular nodes. 15 mm subcarinal node. No pericardial effusion. Lungs/Pleura: Small bilateral pleural effusions. No pneumothorax. There are patchy areas of panlobular emphysema within the lungs affecting both the upper and  lower lung zones. There is scattered interlobular septal thickening throughout the lungs. It is more pronounced towards the lung bases. There is dependent atelectasis associated with the small pleural effusions. Upper Abdomen: No acute abnormality. Small gastrohepatic ligament lymph nodes. Musculoskeletal: No vertebral compression deformity. No obvious acute rib fracture. Review of the MIP images confirms the above findings. IMPRESSION: No evidence of acute pulmonary thromboembolism. There are small pleural effusions, there is interlobular septal thickening in the lungs, and there is mild mediastinal and hilar adenopathy. These findings may simply represent volume overload. An inflammatory process or even a lymphoproliferative process cannot be excluded. Consider 3 month follow-up to ensure resolution of these findings. Aortic Atherosclerosis (ICD10-I70.0) and Emphysema (ICD10-J43.9). Electronically Signed   By: Marybelle Killings M.D.   On: 08/13/2017 18:26   Dg Chest Portable 1 View  Result Date: 08/13/2017 CLINICAL DATA:  Pt presents to ED c/o SOB worsening over the past month. Was seen at PCP today and noted to have room air O2 sat in the 60s. Hx of asthma, hep  C, diabetes, hypetension EXAM: PORTABLE CHEST 1 VIEW COMPARISON:  06/03/2016 FINDINGS: Cardiac silhouette is normal in size. No mediastinal or hilar masses. No evidence of adenopathy. Prominent bronchovascular markings with additional irregularly thickened interstitial markings, the latter present in the lower lungs, increased from the prior study. No lung consolidation. No convincing pleural effusion. No pneumothorax. Skeletal structures are demineralized but grossly intact. IMPRESSION: 1. Increase in interstitial thickening and prominent bronchovascular markings in the lower lungs when compared to the prior study. Findings suggest interstitial edema versus bilateral lower lung zone interstitial infection, former suspected. Electronically Signed   By: Lajean Manes M.D.   On: 08/13/2017 16:21   IMPRESSION: No evidence of acute pulmonary thromboembolism.  There are small pleural effusions, there is interlobular septal thickening in the lungs, and there is mild mediastinal and hilar adenopathy. These findings may simply represent volume overload. An inflammatory process or even a lymphoproliferative process cannot be excluded. Consider 3 month follow-up to ensure resolution of these findings.  Aortic Atherosclerosis (ICD10-I70.0) and Emphysema (ICD10-J43.9).   Electronically Signed   By: Marybelle Killings M.D.   On: 08/13/2017 18:26  SPIROMETRY: FVC was 1.75 liters, 77% of predicted FEV1 was 1.11, 61% of predicted FEV1 ratio was 64 FEF 25-75% liters per second was 18% of predicted  LUNG VOLUMES: TLC was 71% of predicted RV was 84% of predicted  DIFFUSION CAPACITY: DLCO was 30% of predicted DLCO/VA was 68% of predicted  FLOW VOLUME LOOP: C/w more of obstruction  Impression Spirometry is c/w moderate obstruction TLC is mildly decreased DLCO is severely decreased,   *Compared to Previous Study, CARBON MONOXIDE DIFFUSING CAPACITY has had greatest decline.     Assessment and  Plan: The pulmonary concern is why she has this profound hypoxia, by hx came over the last 2-3 days. She had an event last month with hospitalization. She is adequately being treated for her asthma/copd. She is a little over weight. As per the last the last visit I am concern she may have pulmonary hypertension. If so her shunt at present may be due to an intermittent patent foramen ovale as a result high right side pressures ( grant it on exam there is not a lot of peripheral findings to support right heart failure). +/- a little edema. There was no pe, no pneumonia at present, she does not appear dehydrated.   -Resume copd regimen -Cautious diuresis  -Bubble echo, bnp, ana -Anticipate right heart  cath -Follow up on her sleep study and treat accordingly  ( untreated -sleep apnea is  a risk factor for pulmonary hypertension).  -Wean fio2 as tolerated -cardiology consult -following with you.     I have personally obtained a history, examined the patient, evaluated laboratory and imaging results, formulated the assessment and plan and placed orders.  The Patient requires high complexity decision making for assessment and support, frequent evaluation and titration of therapies, application of advanced monitoring technologies and extensive interpretation of multiple databases.   Herbon Fleming,M.D. Pulmonary & Critical care Medicine [facility]

## 2017-08-15 LAB — GLUCOSE, CAPILLARY
GLUCOSE-CAPILLARY: 260 mg/dL — AB (ref 70–99)
GLUCOSE-CAPILLARY: 209 mg/dL — AB (ref 70–99)
Glucose-Capillary: 316 mg/dL — ABNORMAL HIGH (ref 70–99)
Glucose-Capillary: 207 mg/dL — ABNORMAL HIGH (ref 70–99)

## 2017-08-15 LAB — ANA W/REFLEX IF POSITIVE: ANA: NEGATIVE

## 2017-08-15 LAB — HIV ANTIBODY (ROUTINE TESTING W REFLEX): HIV Screen 4th Generation wRfx: NONREACTIVE

## 2017-08-15 MED ORDER — METHYLPREDNISOLONE SODIUM SUCC 125 MG IJ SOLR
60.0000 mg | INTRAMUSCULAR | Status: DC
  Administered 2017-08-16 – 2017-08-18 (×3): 60 mg via INTRAVENOUS
  Filled 2017-08-15 (×3): qty 2

## 2017-08-15 NOTE — Plan of Care (Signed)
  Problem: Education: Goal: Knowledge of disease or condition will improve Outcome: Progressing Goal: Knowledge of the prescribed therapeutic regimen will improve Outcome: Progressing Goal: Individualized Educational Video(s) Outcome: Progressing   Problem: Respiratory: Goal: Ability to maintain a clear airway will improve Outcome: Progressing Goal: Levels of oxygenation will improve Outcome: Progressing Goal: Ability to maintain adequate ventilation will improve Outcome: Progressing   Problem: Education: Goal: Knowledge of General Education information will improve Description Including pain rating scale, medication(s)/side effects and non-pharmacologic comfort measures Outcome: Progressing   Problem: Clinical Measurements: Goal: Ability to maintain clinical measurements within normal limits will improve Outcome: Progressing Goal: Will remain free from infection Outcome: Progressing   Problem: Pain Managment: Goal: General experience of comfort will improve Outcome: Progressing   Problem: Safety: Goal: Ability to remain free from injury will improve Outcome: Progressing   Problem: Skin Integrity: Goal: Risk for impaired skin integrity will decrease Outcome: Progressing

## 2017-08-15 NOTE — Plan of Care (Signed)
  Problem: Education: Goal: Knowledge of disease or condition will improve 08/15/2017 1946 by Herbie Baltimore, RN Outcome: Progressing 08/15/2017 1940 by Herbie Baltimore, RN Outcome: Progressing Goal: Knowledge of the prescribed therapeutic regimen will improve 08/15/2017 1946 by Herbie Baltimore, RN Outcome: Progressing 08/15/2017 1940 by Herbie Baltimore, RN Outcome: Progressing Goal: Individualized Educational Video(s) 08/15/2017 1946 by Herbie Baltimore, RN Outcome: Progressing 08/15/2017 1940 by Herbie Baltimore, RN Outcome: Progressing   Problem: Respiratory: Goal: Ability to maintain a clear airway will improve 08/15/2017 1946 by Herbie Baltimore, RN Outcome: Progressing 08/15/2017 1940 by Herbie Baltimore, RN Outcome: Progressing Goal: Levels of oxygenation will improve 08/15/2017 1946 by Herbie Baltimore, RN Outcome: Progressing 08/15/2017 1940 by Herbie Baltimore, RN Outcome: Progressing Goal: Ability to maintain adequate ventilation will improve 08/15/2017 1946 by Herbie Baltimore, RN Outcome: Progressing 08/15/2017 1940 by Herbie Baltimore, RN Outcome: Progressing   Problem: Education: Goal: Knowledge of General Education information will improve Description Including pain rating scale, medication(s)/side effects and non-pharmacologic comfort measures 08/15/2017 1946 by Herbie Baltimore, RN Outcome: Progressing 08/15/2017 1940 by Herbie Baltimore, RN Outcome: Progressing   Problem: Clinical Measurements: Goal: Ability to maintain clinical measurements within normal limits will improve 08/15/2017 1946 by Herbie Baltimore, RN Outcome: Progressing 08/15/2017 1940 by Herbie Baltimore, RN Outcome: Progressing Goal: Will remain free from infection 08/15/2017 1946 by Herbie Baltimore, RN Outcome: Progressing 08/15/2017 1940 by Herbie Baltimore, RN Outcome: Progressing   Problem: Pain Managment: Goal:  General experience of comfort will improve 08/15/2017 1946 by Herbie Baltimore, RN Outcome: Progressing 08/15/2017 1940 by Herbie Baltimore, RN Outcome: Progressing   Problem: Safety: Goal: Ability to remain free from injury will improve 08/15/2017 1946 by Herbie Baltimore, RN Outcome: Progressing 08/15/2017 1940 by Herbie Baltimore, RN Outcome: Progressing   Problem: Skin Integrity: Goal: Risk for impaired skin integrity will decrease 08/15/2017 1946 by Herbie Baltimore, RN Outcome: Progressing 08/15/2017 1940 by Herbie Baltimore, RN Outcome: Progressing

## 2017-08-15 NOTE — Progress Notes (Signed)
[facility] NAME: Stephanie Short    MR#:  [id number]  DATE OF BIRTH:  Apr 18, 1950  SUBJECTIVE:  CHIEF COMPLAINT:   Chief Complaint  Patient presents with  . Shortness of Breath   With worsening shortness of breath and requiring high oxygen. Still on 6 L oxygen.  But feels much better today than yesterday.  REVIEW OF SYSTEMS:  CONSTITUTIONAL: No fever, fatigue or weakness.  EYES: No blurred or double vision.  EARS, NOSE, AND THROAT: No tinnitus or ear pain.  RESPIRATORY: No cough, have shortness of breath, no wheezing or hemoptysis.  CARDIOVASCULAR: No chest pain, orthopnea, edema.  GASTROINTESTINAL: No nausea, vomiting, diarrhea or abdominal pain.  GENITOURINARY: No dysuria, hematuria.  ENDOCRINE: No polyuria, nocturia,  HEMATOLOGY: No anemia, easy bruising or bleeding SKIN: No rash or lesion. MUSCULOSKELETAL: No joint pain or arthritis.   NEUROLOGIC: No tingling, numbness, weakness.  PSYCHIATRY: No anxiety or depression.   ROS  DRUG ALLERGIES:   Allergies  Allergen Reactions  . Codeine Nausea And Vomiting  . Dilaudid [Hydromorphone Hcl] Nausea And Vomiting  . Tramadol Nausea And Vomiting and Nausea Only  . Gabapentin Nausea And Vomiting and Other (See Comments)    Reaction:  Dizziness and GI upset  . Hydrocodone-Acetaminophen Nausea And Vomiting and Nausea Only  . Hydromorphone Nausea Only  . Oxycodone-Acetaminophen Nausea Only  . Promethazine Other (See Comments)    Other reaction(s): Other (See Comments) Reaction:  GI upset  Reaction:  GI upset   . Scallops [Shellfish Allergy] Nausea And Vomiting    Other reaction(s): Nausea And Vomiting    VITALS:  Blood pressure 129/77, pulse 90, temperature 97.6 F (36.4 C), temperature source Oral, resp. rate 18, height _0  (1.6 m), weight 92.2 kg (203 lb 3.2 oz), SpO2 95 %.  PHYSICAL EXAMINATION:  GENERAL:  67 y.o.-year-old patient lying in the bed with no acute  distress.  EYES: Pupils equal, round, reactive to light and accommodation. No scleral icterus. Extraocular muscles intact.  HEENT: Head atraumatic, normocephalic. Oropharynx and nasopharynx clear.  NECK:  Supple, no jugular venous distention. No thyroid enlargement, no tenderness.  LUNGS: Normal breath sounds bilaterally, no wheezing, have crepitation. No use of accessory muscles of respiration.  On 5 L oxygen. CARDIOVASCULAR: S1, S2 normal. No murmurs, rubs, or gallops.  ABDOMEN: Soft, nontender, nondistended. Bowel sounds present. No organomegaly or mass.  EXTREMITIES: No pedal edema, cyanosis, or clubbing.  NEUROLOGIC: Cranial nerves II through XII are intact. Muscle strength 5/5 in all extremities. Sensation intact. Gait not checked.  PSYCHIATRIC: The patient is alert and oriented x 3.  SKIN: No obvious rash, lesion, or ulcer.   Physical Exam LABORATORY PANEL:   CBC Recent Labs  Lab 08/14/17 0332  WBC 5.7  HGB 15.3  HCT 45.8  PLT 203   ------------------------------------------------------------------------------------------------------------------  Chemistries  Recent Labs  Lab 08/13/17 1553 08/14/17 0332  NA 140 136  K 4.3 4.4  CL 107 103  CO2 25 25  GLUCOSE 197* 320*  BUN 19 23  CREATININE 1.13* 1.18*  CALCIUM 9.1 8.6*  AST 20  --   ALT 13  --   ALKPHOS 106  --   BILITOT 0.6  --    ------------------------------------------------------------------------------------------------------------------  Cardiac Enzymes Recent Labs  Lab 08/13/17 2312 08/14/17 0332  TROPONINI <0.03 <0.03   ------------------------------------------------------------------------------------------------------------------  RADIOLOGY:  No results found.  ASSESSMENT AND PLAN:   Principal Problem:   Acute respiratory failure with hypoxia ([place])  Active Problems:   COPD with acute exacerbation (HCC)   Acute diastolic CHF (congestive heart failure) (HCC)   Acute respiratory failure  (HCC)   *Acute hypoxic respiratory failure This could be secondary to combination of diastolic CHF and COPD. Patient have history of hepatitis C and her sister have a history of lupus, we may need to keep that in mind. She is currently requiring 6 L of supplemental oxygen, will try to taper as she can. In any event of worsening, she may go back to BiPAP or she would like to be a full code.  *Acute diastolic congestive heart failure CT scan chest shows some sign of edema. As per patient she was told in [place], she has elevated right-sided pressure. IV Lasix and keep on fluid restriction. Cardiology consult for further management. Advised echocardiogram with bubble study. Which did not show any shunt.  *COPD exacerbation She does not have much wheezing but have underlying COPD and with her significant respiratory failure I would give her steroid and nebulizer treatment while here. Appreciated pulmonary consult, thinking she has pulmonary hypertension due to sleep apnea.  *Diabetes Keep on glimepiride and sliding scale coverage.  Blood sugar running high due to Steroids, decrease steroids frequency.  *Hypertension Keep on ARB.  *Anxiety Continue Xanax.   All the records are reviewed and case discussed with Care Management/Social Workerr. Management plans discussed with the patient, family and they are in agreement.  CODE STATUS: Full.  TOTAL TIME TAKING CARE OF THIS PATIENT: 35 minutes.     POSSIBLE D/C IN 1-2 DAYS, DEPENDING ON CLINICAL CONDITION.   Vaughan Basta M.D on 08/15/2017   Between 7am to 6pm - Pager - [phone]  After 6pm go to [email or web address] - password [email or web address] [facility]  Office  [phone]  CC: Primary care physician; Kirk Ruths, MD  Note: This dictation was prepared with Dragon dictation along with smaller phrase technology. Any transcriptional errors that result from this process are unintentional.

## 2017-08-15 NOTE — Progress Notes (Signed)
. Date: 08/15/2017,   MRN# [id number] Stephanie Short [birth year] Code Status:     Code Status Orders  (From admission, onward)        Start     Ordered   08/13/17 2051  Full code  Continuous     08/13/17 2050    Code Status History    This patient has a current code status but no historical code status.     Hosp day:_0 @ HPI: in bed, had an uneventful day. On 5 lters, less sob and no wheezing this pm.   HIV Screen 4th Generation wRfx Non Reactive Non Reactive    Anti Nuclear Antibody(ANA) Negative Negative    B Natriuretic Peptide 0.0 - 100.0 pg/mL 309.0High   254.[facility]  CM   Per cardiology no right to left shunt noted   PMHX:   Past Medical History:  Diagnosis Date  . Asthma   . Depression   . Diabetes mellitus   . Hepatitis C    Dr. 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 Hypertension   . Insomnia    Surgical Hx:  Past Surgical History:  Procedure Laterality Date  . BACK SURGERY  2013   [facility] Dr. Mauri Pole  . COLONOSCOPY W/ POLYPECTOMY    . COLONOSCOPY WITH PROPOFOL N/A 03/13/2015   Procedure: COLONOSCOPY WITH PROPOFOL;  Surgeon: Lollie Sails, MD;  Location: [facility] ENDOSCOPY;  Service: Endoscopy;  Laterality: N/A;  . HYSTEROSCOPY W/D&C N/A 08/18/2014   Procedure: DILATATION AND CURETTAGE /HYSTEROSCOPY;  Surgeon: Lorette Ang, MD;  Location: [facility] ORS;  Service: Gynecology;  Laterality: N/A;  . KNEE ARTHROSCOPY Right    Family Hx:  Family History  Problem Relation Age of Onset  . Coronary artery disease Mother 75  . Coronary artery disease Brother 57  . Heart attack Brother        MI  . Heart disease Father   . Diabetes Father   . Diabetes Other   . Hypertension Other   . Colon cancer Other    Social Hx:   Social History   Tobacco Use  . Smoking status: Former Smoker    Packs/day: 1.00    Years: 20.00    Pack years: 20.00    Last attempt to quit: 01/21/2004    Years since quitting: 13.5  . Smokeless tobacco: Never Used   Substance Use Topics  . Alcohol use: Yes    Alcohol/week: 0.0 - 0.6 oz    Comment: Social  . Drug use: No   Medication:    Home Medication:    Current Medication: _1 @   Allergies:  Codeine; Dilaudid [hydromorphone hcl]; Tramadol; Gabapentin; Hydrocodone-acetaminophen; Hydromorphone; Oxycodone-acetaminophen; Promethazine; and Scallops [shellfish allergy]  Review of Systems: Gen:  Denies  fever, sweats, chills HEENT: Denies blurred vision, double vision, ear pain, eye pain, hearing loss, nose bleeds, sore throat Cvc:  No dizziness, chest pain or heaviness Resp:  No wheezing this pm  Gi: Denies swallowing difficulty, stomach pain, nausea or vomiting, diarrhea, constipation, bowel incontinence Gu:  Denies bladder incontinence, burning urine Ext:   No Joint pain, stiffness or swelling Skin: No skin rash, easy bruising or bleeding or hives Endoc:  No polyuria, polydipsia , polyphagia or weight change Psych: No depression, insomnia or hallucinations  Other:  All other systems negative  Physical Examination:   VS: BP 132/79 (BP Location: Right Arm)   Pulse 89   Temp 98.3 F (36.8 C) (Oral)   Resp (!) 22  Ht _0  (1.6 m)   Wt 92.2 kg (203 lb 3.2 oz)   SpO2 95%   BMI 36.00 kg/m   General Appearance: No distress, Powers 02 AT 5 liters Husband at bed side  Neuro: without focal findings, mental status, speech normal, alert and oriented, cranial nerves 2-12 intact, reflexes normal and symmetric, sensation grossly normal  HEENT: PERRLA, EOM intact, no ptosis, no other lesions Neck: NECK:  NO JVD, NO STRIDOR Pulmonary:.No wheezing, No rales  Sputum Production:   Cardiovascular:  Normal S1,S2.  No m/r/g.  Abdominal aorta pulsation normal.    Abdomen:Benign, Soft, non-tender, No masses, hepatosplenomegaly, No lymphadenopathy Endoc: No evident thyromegaly, no signs of acromegaly or Cushing features Skin:   warm, no rashes, no ecchymosis  Extremities: normal, no cyanosis,  clubbing, no edema, warm with normal capillary refill. Other findings:   Labs results:   Recent Labs    08/13/17 1553 08/14/17 0332  HGB 15.6 15.3  HCT 46.6 45.8  MCV 84.4 83.8  WBC 8.3 5.7  BUN 19 23  CREATININE 1.13* 1.18*  GLUCOSE 197* 320*  CALCIUM 9.1 8.6*  ,     Assessment and Plan: The pulmonary concern is why she has this profound hypoxia, by hx came over the last 2-3 days. She had an event last month with hospitalization. She is adequately being treated for her asthma/copd. She is a little over weight. As per the last the last visit I am still concern she may have pulmonary hypertension and diastolic failure. Some better with your efforts including diuresis.  There was no pe, no pneumonia at present -Resume copd regimen -Continue  diuresis  -Follow up on her sleep study and treat accordingly  ( untreated -sleep apnea is  a risk factor for pulmonary hypertension).  -Wean fio2 as tolerated -following cardiology recs, ? Right heart cath as we go forwar -following with you.     I have personally obtained a history, examined the patient, evaluated laboratory and imaging results, formulated the assessment and plan and placed orders.  The Patient requires high complexity decision making for assessment and support, frequent evaluation and titration of therapies, application of advanced monitoring technologies and extensive interpretation of multiple databases.   Herbon Fleming,M.D. Pulmonary Medicine [facility]                 I have personally obtained a history, examined the patient, evaluated laboratory and imaging results, formulated the assessment and plan and placed orders.  The Patient requires high complexity decision making for assessment and support, frequent evaluation and titration of therapies, application of advanced monitoring technologies and extensive interpretation of multiple databases.   Herbon Fleming,M.D. Pulmonary & Critical  care Medicine [facility]

## 2017-08-15 NOTE — Progress Notes (Signed)
Patient Name: Stephanie Short Date of Encounter: 08/15/2017  Hospital Problem List     Principal Problem:   Acute respiratory failure with hypoxia [facility]) Active Problems:   COPD with acute exacerbation (HCC)   Acute diastolic CHF (congestive heart failure) ([place])   Acute respiratory failure ([place])    Patient Profile     Less short of breath today.  Echo with bubble study revealed no shunting.   Subjective   Less short of breath  Inpatient Medications    . aspirin EC  81 mg Oral QHS  . famotidine  10 mg Oral BID  . furosemide  20 mg Intravenous Q8H  . glimepiride  4 mg Oral BID  . heparin  5,000 Units Subcutaneous Q8H  . insulin aspart  0-20 Units Subcutaneous TID WC  . insulin aspart  0-5 Units Subcutaneous QHS  . ipratropium-albuterol  3 mL Nebulization QID  . irbesartan  37.5 mg Oral Daily  . [START ON 08/16/2017] methylPREDNISolone (SOLU-MEDROL) injection  60 mg Intravenous Q24H  . umeclidinium-vilanterol  1 puff Inhalation Daily  . vitamin B-12  1,000 mcg Oral Daily    Vital Signs    Vitals:   08/14/17 2053 08/15/17 0413 08/15/17 0800 08/15/17 1422  BP: (!) 150/80 127/78 (!) 143/90 129/77  Pulse: (!) 105 92  90  Resp: _0 Temp: 98 F (36.7 C) 98.4 F (36.9 C) 97.6 F (36.4 C) 97.6 F (36.4 C)  TempSrc: Oral  Oral Oral  SpO2: 92% 91% 94% 95%  Weight:      Height:        Intake/Output Summary (Last 24 hours) at 08/15/2017 1428 Last data filed at 08/14/2017 1839 Gross per 24 hour  Intake 240 ml  Output -  Net 240 ml   Filed Weights   08/13/17 1545 08/13/17 2137  Weight: 86.2 kg (190 lb) 92.2 kg (203 lb 3.2 oz)    Physical Exam    GEN: Well nourished, well developed, in no acute distress.  HEENT: normal.  Neck: Supple, no JVD, carotid bruits, or masses. Cardiac: RRR, no murmurs, rubs, or gallops. No clubbing, cyanosis, edema.  Radials/DP/PT 2+ and equal bilaterally.  Respiratory:  Respirations regular and unlabored, clear to  auscultation bilaterally. GI: Soft, nontender, nondistended, BS + x 4. MS: no deformity or atrophy. Skin: warm and dry, no rash. Neuro:  Strength and sensation are intact. Psych: Normal affect.  Labs    CBC Recent Labs    08/13/17 1553 08/14/17 0332  WBC 8.3 5.7  NEUTROABS 6.3  --   HGB 15.6 15.3  HCT 46.6 45.8  MCV 84.4 83.8  PLT 217 748   Basic Metabolic Panel Recent Labs    08/13/17 1553 08/14/17 0332  NA 140 136  K 4.3 4.4  CL 107 103  CO2 25 25  GLUCOSE 197* 320*  BUN 19 23  CREATININE 1.13* 1.18*  CALCIUM 9.1 8.6*   Liver Function Tests Recent Labs    08/13/17 1553  AST 20  ALT 13  ALKPHOS 106  BILITOT 0.6  PROT 8.5*  ALBUMIN 3.9   No results for input(s): LIPASE, AMYLASE in the last 72 hours. Cardiac Enzymes Recent Labs    08/13/17 1926 08/13/17 2312 08/14/17 0332  TROPONINI <0.03 <0.03 <0.03   BNP Recent Labs    08/13/17 1553 08/14/17 1500  BNP 254.0* 309.0*   D-Dimer No results for input(s): DDIMER in the last 72 hours. Hemoglobin A1C No results for input(s):  HGBA1C in the last 72 hours. Fasting Lipid Panel No results for input(s): CHOL, HDL, LDLCALC, TRIG, CHOLHDL, LDLDIRECT in the last 72 hours. Thyroid Function Tests No results for input(s): TSH, T4TOTAL, T3FREE, THYROIDAB in the last 72 hours.  Invalid input(s): FREET3  Telemetry    Sinus rhythm ECG  Sinus rhythm with no ischemia  Radiology    Ct Angio Chest Pe W And/or Wo Contrast  Result Date: 08/13/2017 CLINICAL DATA:  Short of breath EXAM: CT ANGIOGRAPHY CHEST WITH CONTRAST TECHNIQUE: Multidetector CT imaging of the chest was performed using the standard protocol during bolus administration of intravenous contrast. Multiplanar CT image reconstructions and MIPs were obtained to evaluate the vascular anatomy. CONTRAST:  63m ISOVUE-370 IOPAMIDOL (ISOVUE-370) INJECTION 76% COMPARISON:  12/01/2014 FINDINGS: Cardiovascular: There are no filling defects in the pulmonary  arterial tree to suggest acute pulmonary thromboembolism. No evidence of aortic dissection or aortic aneurysm. Atherosclerotic calcifications of the aortic arch are noted. Moderate LAD territory coronary artery calcification. Mediastinum/Nodes: Mild mediastinal and hilar adenopathy are noted. 10 mm precarinal node on image 28. 13 mm AP window node on image 28. Subcentimeter short axis diameter prevascular nodes. 15 mm subcarinal node. No pericardial effusion. Lungs/Pleura: Small bilateral pleural effusions. No pneumothorax. There are patchy areas of panlobular emphysema within the lungs affecting both the upper and lower lung zones. There is scattered interlobular septal thickening throughout the lungs. It is more pronounced towards the lung bases. There is dependent atelectasis associated with the small pleural effusions. Upper Abdomen: No acute abnormality. Small gastrohepatic ligament lymph nodes. Musculoskeletal: No vertebral compression deformity. No obvious acute rib fracture. Review of the MIP images confirms the above findings. IMPRESSION: No evidence of acute pulmonary thromboembolism. There are small pleural effusions, there is interlobular septal thickening in the lungs, and there is mild mediastinal and hilar adenopathy. These findings may simply represent volume overload. An inflammatory process or even a lymphoproliferative process cannot be excluded. Consider 3 month follow-up to ensure resolution of these findings. Aortic Atherosclerosis (ICD10-I70.0) and Emphysema (ICD10-J43.9). Electronically Signed   By: AMarybelle KillingsM.D.   On: 08/13/2017 18:26   Dg Chest Portable 1 View  Result Date: 08/13/2017 CLINICAL DATA:  Pt presents to ED c/o SOB worsening over the past month. Was seen at PCP today and noted to have room air O2 sat in the 60s. Hx of asthma, hep C, diabetes, hypetension EXAM: PORTABLE CHEST 1 VIEW COMPARISON:  06/03/2016 FINDINGS: Cardiac silhouette is normal in size. No mediastinal or  hilar masses. No evidence of adenopathy. Prominent bronchovascular markings with additional irregularly thickened interstitial markings, the latter present in the lower lungs, increased from the prior study. No lung consolidation. No convincing pleural effusion. No pneumothorax. Skeletal structures are demineralized but grossly intact. IMPRESSION: 1. Increase in interstitial thickening and prominent bronchovascular markings in the lower lungs when compared to the prior study. Findings suggest interstitial edema versus bilateral lower lung zone interstitial infection, former suspected. Electronically Signed   By: DLajean ManesM.D.   On: 08/13/2017 16:21    Assessment & Plan    Has improved somewhat.  No right to left shunt.  She is improving with IV Lasix.  We will continue with careful IV diuresis.  BNP increased slightly from 2 days ago.  Will carefully follow renal function as well as BNP.  Signed, KJavier DockerFath MD 08/15/2017, 2:28 PM  Pager: [phone]

## 2017-08-16 LAB — CBC
HCT: 41.4 % (ref 35.0–47.0)
HEMOGLOBIN: 14 g/dL (ref 12.0–16.0)
MCH: 28.2 pg (ref 26.0–34.0)
MCHC: 33.7 g/dL (ref 32.0–36.0)
MCV: 83.6 fL (ref 80.0–100.0)
Platelets: 203 10*3/uL (ref 150–440)
RBC: 4.95 MIL/uL (ref 3.80–5.20)
RDW: 16.7 % — ABNORMAL HIGH (ref 11.5–14.5)
WBC: 10.8 10*3/uL (ref 3.6–11.0)

## 2017-08-16 LAB — BASIC METABOLIC PANEL
Anion gap: 8 (ref 5–15)
BUN: 45 mg/dL — AB (ref 8–23)
CHLORIDE: 102 mmol/L (ref 98–111)
CO2: 31 mmol/L (ref 22–32)
CREATININE: 0.99 mg/dL (ref 0.44–1.00)
Calcium: 9 mg/dL (ref 8.9–10.3)
GFR calc Af Amer: >60 mL/min (ref >60)
GFR calc non Af Amer: 58 mL/min — ABNORMAL LOW (ref >60)
Glucose, Bld: 203 mg/dL — ABNORMAL HIGH (ref 70–99)
Potassium: 3.9 mmol/L (ref 3.5–5.1)
SODIUM: 141 mmol/L (ref 135–145)

## 2017-08-16 LAB — GLUCOSE, CAPILLARY
GLUCOSE-CAPILLARY: 170 mg/dL — AB (ref 70–99)
Glucose-Capillary: 330 mg/dL — ABNORMAL HIGH (ref 70–99)
Glucose-Capillary: 271 mg/dL — ABNORMAL HIGH (ref 70–99)
Glucose-Capillary: 178 mg/dL — ABNORMAL HIGH (ref 70–99)

## 2017-08-16 MED ORDER — IPRATROPIUM-ALBUTEROL 0.5-2.5 (3) MG/3ML IN SOLN
3.0000 mL | Freq: Three times a day (TID) | RESPIRATORY_TRACT | Status: DC
  Administered 2017-08-16 – 2017-08-19 (×10): 3 mL via RESPIRATORY_TRACT
  Filled 2017-08-16 (×10): qty 3

## 2017-08-16 MED ORDER — POLYETHYLENE GLYCOL 3350 17 G PO PACK
17.0000 g | PACK | Freq: Every day | ORAL | Status: DC
  Administered 2017-08-16 – 2017-08-17 (×2): 17 g via ORAL
  Filled 2017-08-16 (×3): qty 1

## 2017-08-16 NOTE — Plan of Care (Signed)
  Problem: Education: Goal: Knowledge of disease or condition will improve 08/16/2017 1626 by Herbie Baltimore, RN Outcome: Progressing 08/16/2017 1623 by Herbie Baltimore, RN Outcome: Progressing Goal: Knowledge of the prescribed therapeutic regimen will improve 08/16/2017 1626 by Herbie Baltimore, RN Outcome: Progressing 08/16/2017 1623 by Herbie Baltimore, RN Outcome: Progressing Goal: Individualized Educational Video(s) 08/16/2017 1626 by Herbie Baltimore, RN Outcome: Progressing 08/16/2017 1623 by Herbie Baltimore, RN Outcome: Progressing   Problem: Respiratory: Goal: Ability to maintain a clear airway will improve 08/16/2017 1626 by Herbie Baltimore, RN Outcome: Progressing 08/16/2017 1623 by Herbie Baltimore, RN Outcome: Progressing Goal: Levels of oxygenation will improve 08/16/2017 1626 by Herbie Baltimore, RN Outcome: Progressing 08/16/2017 1623 by Herbie Baltimore, RN Outcome: Progressing Goal: Ability to maintain adequate ventilation will improve 08/16/2017 1626 by Herbie Baltimore, RN Outcome: Progressing 08/16/2017 1623 by Herbie Baltimore, RN Outcome: Progressing   Problem: Education: Goal: Knowledge of General Education information will improve Description Including pain rating scale, medication(s)/side effects and non-pharmacologic comfort measures 08/16/2017 1626 by Herbie Baltimore, RN Outcome: Progressing 08/16/2017 1623 by Herbie Baltimore, RN Outcome: Progressing   Problem: Clinical Measurements: Goal: Ability to maintain clinical measurements within normal limits will improve 08/16/2017 1626 by Herbie Baltimore, RN Outcome: Progressing 08/16/2017 1623 by Herbie Baltimore, RN Outcome: Progressing Goal: Will remain free from infection 08/16/2017 1626 by Herbie Baltimore, RN Outcome: Progressing 08/16/2017 1623 by Herbie Baltimore, RN Outcome: Progressing   Problem: Pain Managment: Goal:  General experience of comfort will improve 08/16/2017 1626 by Herbie Baltimore, RN Outcome: Progressing 08/16/2017 1623 by Herbie Baltimore, RN Outcome: Progressing   Problem: Safety: Goal: Ability to remain free from injury will improve 08/16/2017 1626 by Herbie Baltimore, RN Outcome: Progressing 08/16/2017 1623 by Herbie Baltimore, RN Outcome: Progressing   Problem: Skin Integrity: Goal: Risk for impaired skin integrity will decrease 08/16/2017 1626 by Herbie Baltimore, RN Outcome: Progressing 08/16/2017 1623 by Herbie Baltimore, RN Outcome: Progressing

## 2017-08-16 NOTE — Progress Notes (Signed)
Date: 08/16/2017,   MRN# [id number] Stephanie Short 17-Jun-1950 Code Status:     Code Status Orders  (From admission, onward)        Start     Ordered   08/13/17 2051  Full code  Continuous     08/13/17 2050    Code Status History    This patient has a current code status but no historical code status.     HPI: Feeling better. fio2 is down to 3.5 liters.w/u in progress  PMHX:   Past Medical History:  Diagnosis Date  . Asthma   . Depression   . Diabetes mellitus   . Hepatitis C    Dr. 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 Hypertension   . Insomnia    Surgical Hx:  Past Surgical History:  Procedure Laterality Date  . BACK SURGERY  2013   [facility] Dr. Mauri Pole  . COLONOSCOPY W/ POLYPECTOMY    . COLONOSCOPY WITH PROPOFOL N/A 03/13/2015   Procedure: COLONOSCOPY WITH PROPOFOL;  Surgeon: Lollie Sails, MD;  Location: [facility] ENDOSCOPY;  Service: Endoscopy;  Laterality: N/A;  . HYSTEROSCOPY W/D&C N/A 08/18/2014   Procedure: DILATATION AND CURETTAGE /HYSTEROSCOPY;  Surgeon: Lorette Ang, MD;  Location: [facility] ORS;  Service: Gynecology;  Laterality: N/A;  . KNEE ARTHROSCOPY Right    Family Hx:  Family History  Problem Relation Age of Onset  . Coronary artery disease Mother 37  . Coronary artery disease Brother 29  . Heart attack Brother        MI  . Heart disease Father   . Diabetes Father   . Diabetes Other   . Hypertension Other   . Colon cancer Other    Social Hx:   Social History   Tobacco Use  . Smoking status: Former Smoker    Packs/day: 1.00    Years: 20.00    Pack years: 20.00    Last attempt to quit: 01/21/2004    Years since quitting: 13.5  . Smokeless tobacco: Never Used  Substance Use Topics  . Alcohol use: Yes    Alcohol/week: 0.0 - 0.6 oz    Comment: Social  . Drug use: No   Medication:    Home Medication:    Current Medication: _0 @   Allergies:  Codeine; Dilaudid [hydromorphone hcl]; Tramadol; Gabapentin;  Hydrocodone-acetaminophen; Hydromorphone; Oxycodone-acetaminophen; Promethazine; and Scallops [shellfish allergy]  Review of Systems: Gen:  Denies  fever, sweats, chills HEENT: Denies blurred vision, double vision, ear pain, eye pain, hearing loss, nose bleeds, sore throat Cvc:  No dizziness, chest pain or heaviness Resp:  Much less shortness of breath  Gi: Denies swallowing difficulty, stomach pain, nausea or vomiting, diarrhea, constipation, bowel incontinence Gu:  Denies bladder incontinence, burning urine Ext:   No Joint pain, stiffness or swelling Skin: No skin rash, easy bruising or bleeding or hives Endoc:  No polyuria, polydipsia , polyphagia or weight change Psych: No depression, insomnia or hallucinations  Other:  All other systems negative  Physical Examination:   VS: BP 132/80 (BP Location: Right Arm)   Pulse 85   Temp 97.9 F (36.6 C) (Oral)   Resp 18   Ht _1  (1.6 m)   Wt 92.2 kg (203 lb 3.2 oz)   SpO2 95%   BMI 36.00 kg/m   General Appearance: No distress, pleasant  Neuro: without focal findings, mental status, speech normal, alert and oriented, cranial nerves 2-12 intact, reflexes normal and symmetric, sensation grossly normal  HEENT: PERRLA, EOM intact, no ptosis, no other lesions noticed NECK: Supple, no jvd, nodes, no stridor   Pulmonary:.No wheezing, No rales  Sputum Production:   Cardiovascular:  Normal S1,S2.  No m/r/g.     Abdomen:Benign, Soft, non-tender, No masses, hepatosplenomegaly, No lymphadenopathy Endoc: No evident thyromegaly, no signs of acromegaly or Cushing features Skin:   warm, no rashes, no ecchymosis  Extremities: normal, no cyanosis, clubbing, no edema, warm with normal capillary refill.  Labs results:   Recent Labs    08/13/17 1553 08/14/17 0332 08/16/17 0331  HGB 15.6 15.3 14.0  HCT 46.6 45.8 41.4  MCV 84.4 83.8 83.6  WBC 8.3 5.7 10.8  BUN 19 23 45*  CREATININE 1.13* 1.18* 0.99  GLUCOSE 197* 320* 203*  CALCIUM 9.1 8.6*  9.0  ,   Assessment and Plan: The pulmonary concern is why she has this profound hypoxia, by hx came over the last 2-3 days. She had an event last month with hospitalization. She is adequately being treated for her asthma/copd. She is a little over weight. As per the last the last visit I am still concern she may have pulmonary hypertension and diastolic failure. Much  better with your efforts including diuresis.  fio2 down 3.5 liters.  There was no pe, no pneumonia at present -continue same  copd regimen -Continue  diuresis  -Follow up on her sleep study and treat accordingly ( untreated -sleep apnea is a risk factor for pulmonary hypertension).  -Wean fio2 as tolerated -following cardiology recs, per above ? Right heart cath as we go forwar -following with you.   I have personally obtained a history, examined the patient, evaluated laboratory and imaging results, formulated the assessment and plan and placed orders.  The Patient requires high complexity decision making for assessment and support, frequent evaluation and titration of therapies, application of advanced monitoring technologies and extensive interpretation of multiple databases.   Arling Cerone,M.D. Pulmonary & Critical care Medicine [facility]

## 2017-08-16 NOTE — Plan of Care (Signed)
  Problem: Education: Goal: Knowledge of disease or condition will improve Outcome: Progressing Goal: Knowledge of the prescribed therapeutic regimen will improve Outcome: Progressing Goal: Individualized Educational Video(s) Outcome: Progressing   Problem: Respiratory: Goal: Ability to maintain a clear airway will improve Outcome: Progressing Goal: Levels of oxygenation will improve Outcome: Progressing Goal: Ability to maintain adequate ventilation will improve Outcome: Progressing   Problem: Education: Goal: Knowledge of General Education information will improve Description Including pain rating scale, medication(s)/side effects and non-pharmacologic comfort measures Outcome: Progressing   Problem: Clinical Measurements: Goal: Ability to maintain clinical measurements within normal limits will improve Outcome: Progressing Goal: Will remain free from infection Outcome: Progressing   Problem: Pain Managment: Goal: General experience of comfort will improve Outcome: Progressing   Problem: Safety: Goal: Ability to remain free from injury will improve Outcome: Progressing   Problem: Skin Integrity: Goal: Risk for impaired skin integrity will decrease Outcome: Progressing

## 2017-08-16 NOTE — Progress Notes (Signed)
[facility] at [place] NAME: Stephanie Short    MR#:  [id number]  DATE OF BIRTH:  Sep 25, 1950  SUBJECTIVE:  CHIEF COMPLAINT:   Chief Complaint  Patient presents with  . Shortness of Breath   With worsening shortness of breath and requiring high oxygen. Now on 4 L oxygen.  feels much better today than yesterday.  REVIEW OF SYSTEMS:  CONSTITUTIONAL: No fever, fatigue or weakness.  EYES: No blurred or double vision.  EARS, NOSE, AND THROAT: No tinnitus or ear pain.  RESPIRATORY: No cough, have shortness of breath, no wheezing or hemoptysis.  CARDIOVASCULAR: No chest pain, orthopnea, edema.  GASTROINTESTINAL: No nausea, vomiting, diarrhea or abdominal pain.  GENITOURINARY: No dysuria, hematuria.  ENDOCRINE: No polyuria, nocturia,  HEMATOLOGY: No anemia, easy bruising or bleeding SKIN: No rash or lesion. MUSCULOSKELETAL: No joint pain or arthritis.   NEUROLOGIC: No tingling, numbness, weakness.  PSYCHIATRY: No anxiety or depression.   ROS  DRUG ALLERGIES:   Allergies  Allergen Reactions  . Codeine Nausea And Vomiting  . Dilaudid [Hydromorphone Hcl] Nausea And Vomiting  . Tramadol Nausea And Vomiting and Nausea Only  . Gabapentin Nausea And Vomiting and Other (See Comments)    Reaction:  Dizziness and GI upset  . Hydrocodone-Acetaminophen Nausea And Vomiting and Nausea Only  . Hydromorphone Nausea Only  . Oxycodone-Acetaminophen Nausea Only  . Promethazine Other (See Comments)    Other reaction(s): Other (See Comments) Reaction:  GI upset  Reaction:  GI upset   . Scallops [Shellfish Allergy] Nausea And Vomiting    Other reaction(s): Nausea And Vomiting    VITALS:  Blood pressure 132/80, pulse 85, temperature 97.9 F (36.6 C), temperature source Oral, resp. rate 18, height _0  (1.6 m), weight 92.2 kg (203 lb 3.2 oz), SpO2 95 %.  PHYSICAL EXAMINATION:  GENERAL:  67 y.o.-year-old patient lying in the bed with no acute  distress.  EYES: Pupils equal, round, reactive to light and accommodation. No scleral icterus. Extraocular muscles intact.  HEENT: Head atraumatic, normocephalic. Oropharynx and nasopharynx clear.  NECK:  Supple, no jugular venous distention. No thyroid enlargement, no tenderness.  LUNGS: Normal breath sounds bilaterally, no wheezing, have crepitation. No use of accessory muscles of respiration.  On 4 L oxygen. CARDIOVASCULAR: S1, S2 normal. No murmurs, rubs, or gallops.  ABDOMEN: Soft, nontender, nondistended. Bowel sounds present. No organomegaly or mass.  EXTREMITIES: No pedal edema, cyanosis, or clubbing.  NEUROLOGIC: Cranial nerves II through XII are intact. Muscle strength 5/5 in all extremities. Sensation intact. Gait not checked.  PSYCHIATRIC: The patient is alert and oriented x 3.  SKIN: No obvious rash, lesion, or ulcer.   Physical Exam LABORATORY PANEL:   CBC Recent Labs  Lab 08/16/17 0331  WBC 10.8  HGB 14.0  HCT 41.4  PLT 203   ------------------------------------------------------------------------------------------------------------------  Chemistries  Recent Labs  Lab 08/13/17 1553  08/16/17 0331  NA 140   < > 141  K 4.3   < > 3.9  CL 107   < > 102  CO2 25   < > 31  GLUCOSE 197*   < > 203*  BUN 19   < > 45*  CREATININE 1.13*   < > 0.99  CALCIUM 9.1   < > 9.0  AST 20  --   --   ALT 13  --   --   ALKPHOS 106  --   --   BILITOT 0.6  --   --    < > =  values in this interval not displayed.   ------------------------------------------------------------------------------------------------------------------  Cardiac Enzymes Recent Labs  Lab 08/13/17 2312 08/14/17 0332  TROPONINI <0.03 <0.03   ------------------------------------------------------------------------------------------------------------------  RADIOLOGY:  No results found.  ASSESSMENT AND PLAN:   Principal Problem:   Acute respiratory failure with hypoxia (HCC) Active Problems:    COPD with acute exacerbation (HCC)   Acute diastolic CHF (congestive heart failure) (HCC)   Acute respiratory failure (HCC)   *Acute hypoxic respiratory failure  secondary to combination of diastolic CHF and COPD. Patient have history of hepatitis C and her sister have a history of lupus, we may need to keep that in mind. She is currently requiring 4 L of supplemental oxygen, ( Was on Bipap and then 6 ltr on admission) will try to taper as she can.  *Acute diastolic congestive heart failure CT scan chest shows some sign of edema. As per patient she was told in [place], she has elevated right-sided pressure. IV Lasix and keep on fluid restriction. Cardiology consult for further management. Advised echocardiogram with bubble study. Which did not show any shunt.  *COPD exacerbation She does not have much wheezing but have underlying COPD and with her significant respiratory failure I would give her steroid and nebulizer treatment while here. Appreciated pulmonary consult, thinking she has pulmonary hypertension due to sleep apnea.  *Diabetes Keep on glimepiride and sliding scale coverage.  Blood sugar running high due to Steroids, decrease steroids frequency.  *Hypertension Keep on ARB.  *Anxiety Continue Xanax.   All the records are reviewed and case discussed with Care Management/Social Workerr. Management plans discussed with the patient, family and they are in agreement.  CODE STATUS: Full.  TOTAL TIME TAKING CARE OF THIS PATIENT: 35 minutes.    POSSIBLE D/C IN 1-2 DAYS, DEPENDING ON CLINICAL CONDITION.   Vaughan Basta M.D on 08/16/2017   Between 7am to 6pm - Pager - [phone]  After 6pm go to [email or web address] - password [email or web address] [facility]  Office  [phone]  CC: Primary care physician; Kirk Ruths, MD  Note: This dictation was prepared with Dragon dictation along with smaller phrase technology. Any transcriptional  errors that result from this process are unintentional.

## 2017-08-17 LAB — GLUCOSE, CAPILLARY
Glucose-Capillary: 189 mg/dL — ABNORMAL HIGH (ref 70–99)
Glucose-Capillary: 347 mg/dL — ABNORMAL HIGH (ref 70–99)
Glucose-Capillary: 356 mg/dL — ABNORMAL HIGH (ref 70–99)
Glucose-Capillary: 92 mg/dL (ref 70–99)

## 2017-08-17 LAB — BASIC METABOLIC PANEL
Anion gap: 9 (ref 5–15)
BUN: 47 mg/dL — AB (ref 8–23)
CALCIUM: 9.3 mg/dL (ref 8.9–10.3)
CO2: 32 mmol/L (ref 22–32)
CREATININE: 1.12 mg/dL — AB (ref 0.44–1.00)
Chloride: 100 mmol/L (ref 98–111)
GFR calc Af Amer: 58 mL/min — ABNORMAL LOW (ref >60)
GFR, EST NON AFRICAN AMERICAN: 50 mL/min — AB (ref >60)
GLUCOSE: 164 mg/dL — AB (ref 70–99)
Potassium: 4.1 mmol/L (ref 3.5–5.1)
Sodium: 141 mmol/L (ref 135–145)

## 2017-08-17 LAB — BRAIN NATRIURETIC PEPTIDE: B Natriuretic Peptide: 61 pg/mL (ref 0.0–100.0)

## 2017-08-17 NOTE — Progress Notes (Signed)
[facility] at [place] NAME: Stephanie Short    MR#:  [id number]  DATE OF BIRTH:  06/30/1950  SUBJECTIVE:  CHIEF COMPLAINT:   Chief Complaint  Patient presents with  . Shortness of Breath   SOB still present. On 4 L o2  REVIEW OF SYSTEMS:  CONSTITUTIONAL: No fever, fatigue or weakness.  EYES: No blurred or double vision.  EARS, NOSE, AND THROAT: No tinnitus or ear pain.  RESPIRATORY: No cough. Has shortness of breath, no wheezing or hemoptysis.  CARDIOVASCULAR: No chest pain, orthopnea, edema.  GASTROINTESTINAL: No nausea, vomiting, diarrhea or abdominal pain.  GENITOURINARY: No dysuria, hematuria.  ENDOCRINE: No polyuria, nocturia,  HEMATOLOGY: No anemia, easy bruising or bleeding SKIN: No rash or lesion. MUSCULOSKELETAL: No joint pain or arthritis.   NEUROLOGIC: No tingling, numbness, weakness.  PSYCHIATRY: No anxiety or depression.   ROS  DRUG ALLERGIES:   Allergies  Allergen Reactions  . Codeine Nausea And Vomiting  . Dilaudid [Hydromorphone Hcl] Nausea And Vomiting  . Tramadol Nausea And Vomiting and Nausea Only  . Gabapentin Nausea And Vomiting and Other (See Comments)    Reaction:  Dizziness and GI upset  . Hydrocodone-Acetaminophen Nausea And Vomiting and Nausea Only  . Hydromorphone Nausea Only  . Oxycodone-Acetaminophen Nausea Only  . Promethazine Other (See Comments)    Other reaction(s): Other (See Comments) Reaction:  GI upset  Reaction:  GI upset   . Scallops [Shellfish Allergy] Nausea And Vomiting    Other reaction(s): Nausea And Vomiting    VITALS:  Blood pressure (!) 123/94, pulse 91, temperature 97.8 F (36.6 C), temperature source Oral, resp. rate 17, height _0  (1.6 m), weight 92.2 kg (203 lb 3.2 oz), SpO2 99 %.  PHYSICAL EXAMINATION:  GENERAL:  67 y.o.-year-old patient lying in the bed with no acute distress.  EYES: Pupils equal, round, reactive to light and accommodation. No scleral icterus.  Extraocular muscles intact.  HEENT: Head atraumatic, normocephalic. Oropharynx and nasopharynx clear.  NECK:  Supple, no jugular venous distention. No thyroid enlargement, no tenderness.  LUNGS: Normal breath sounds bilaterally, no wheezing, have crepitation. No use of accessory muscles of respiration.  On 4 L oxygen. CARDIOVASCULAR: S1, S2 normal. No murmurs, rubs, or gallops.  ABDOMEN: Soft, nontender, nondistended. Bowel sounds present. No organomegaly or mass.  EXTREMITIES: No pedal edema, cyanosis, or clubbing.  NEUROLOGIC: Cranial nerves II through XII are intact. Muscle strength 5/5 in all extremities. Sensation intact. Gait not checked.  PSYCHIATRIC: The patient is alert and oriented x 3.  SKIN: No obvious rash, lesion, or ulcer.   Physical Exam LABORATORY PANEL:   CBC Recent Labs  Lab 08/16/17 0331  WBC 10.8  HGB 14.0  HCT 41.4  PLT 203   ------------------------------------------------------------------------------------------------------------------  Chemistries  Recent Labs  Lab 08/13/17 1553  08/17/17 0332  NA 140   < > 141  K 4.3   < > 4.1  CL 107   < > 100  CO2 25   < > 32  GLUCOSE 197*   < > 164*  BUN 19   < > 47*  CREATININE 1.13*   < > 1.12*  CALCIUM 9.1   < > 9.3  AST 20  --   --   ALT 13  --   --   ALKPHOS 106  --   --   BILITOT 0.6  --   --    < > = values in this interval not displayed.   ------------------------------------------------------------------------------------------------------------------  Cardiac Enzymes Recent Labs  Lab 08/13/17 2312 08/14/17 0332  TROPONINI <0.03 <0.03   ------------------------------------------------------------------------------------------------------------------  RADIOLOGY:  No results found.  ASSESSMENT AND PLAN:   Principal Problem:   Acute respiratory failure with hypoxia (HCC) Active Problems:   COPD with acute exacerbation (HCC)   Acute diastolic CHF (congestive heart failure) (HCC)    Acute respiratory failure (HCC)   *Acute hypoxic respiratory failure  secondary to combination of diastolic CHF and COPD. She is currently requiring 4 L of supplemental oxygen Taper as tolerated  *Acute on chronic diastolic congestive heart failure CT scan chest shows some sign of edema. IV Lasix Cardiology consulted echocardiogram with bubble study. Which did not show any shunt.  * Acute COPD exacerbation On steroids and nebs Inhalers  *Diabetes SSI  *Hypertension Well controlled  *Anxiety Continue Xanax.  All the records are reviewed and case discussed with Care Management/Social Workerr. Management plans discussed with the patient, family and they are in agreement.  CODE STATUS: Full.  TOTAL TIME TAKING CARE OF THIS PATIENT: 35 minutes.   POSSIBLE D/C IN 1-2 DAYS, DEPENDING ON CLINICAL CONDITION.  Neita Carp M.D on 08/17/2017   Between 7am to 6pm - Pager - [phone]  After 6pm go to [email or web address] - password [email or web address] [facility]  Office  [phone]  CC: Primary care physician; Kirk Ruths, MD  Note: This dictation was prepared with Dragon dictation along with smaller phrase technology. Any transcriptional errors that result from this process are unintentional.

## 2017-08-17 NOTE — Plan of Care (Signed)
  Problem: Education: Goal: Knowledge of disease or condition will improve Outcome: Progressing Goal: Knowledge of the prescribed therapeutic regimen will improve Outcome: Progressing Goal: Individualized Educational Video(s) Outcome: Progressing   Problem: Respiratory: Goal: Ability to maintain a clear airway will improve Outcome: Progressing Goal: Levels of oxygenation will improve Outcome: Progressing Goal: Ability to maintain adequate ventilation will improve Outcome: Progressing   Problem: Education: Goal: Knowledge of General Education information will improve Description Including pain rating scale, medication(s)/side effects and non-pharmacologic comfort measures Outcome: Progressing   Problem: Clinical Measurements: Goal: Ability to maintain clinical measurements within normal limits will improve Outcome: Progressing Goal: Will remain free from infection Outcome: Progressing   Problem: Pain Managment: Goal: General experience of comfort will improve Outcome: Progressing   Problem: Safety: Goal: Ability to remain free from injury will improve Outcome: Progressing   Problem: Skin Integrity: Goal: Risk for impaired skin integrity will decrease Outcome: Progressing

## 2017-08-17 NOTE — Progress Notes (Signed)
Inpatient Diabetes Program Recommendations  AACE/ADA: New Consensus Statement on Inpatient Glycemic Control (2015)  Target Ranges:  Prepandial:   less than 140 mg/dL      Peak postprandial:   less than 180 mg/dL (1-2 hours)      Critically ill patients:  140 - 180 mg/dL   Results for Stephanie Short, Stephanie Short (MRN [id number]) as of 08/17/2017 13:08  Ref. Range 08/16/2017 07:47 08/16/2017 11:58 08/16/2017 16:37 08/16/2017 20:30  Glucose-Capillary Latest Ref Range: 70 - 99 mg/dL 170 (H)  4 units NOVOLOG  330 (H)  15 units NOVOLOG  271 (H)  11 units NOVOLOG  178 (H)   Results for Stephanie Short, Stephanie Short (MRN [id number]) as of 08/17/2017 13:08  Ref. Range 08/17/2017 07:42 08/17/2017 12:07  Glucose-Capillary Latest Ref Range: 70 - 99 mg/dL 189 (H)  4 units NOVOLOG  347 (H)  15 units NOVOLOG     Home DM Meds: Amaryl 2 mg daily  Current Insulin Orders: Amaryl 4 mg BID      Novolog Resistant Correction Scale/ SSI (0-20 units) TID AC + HS     Patient currently getting Solumedrol 60 mg daily.  CBGs elevated.   MD- Please consider the following in-hospital insulin adjustments while patient remains on IV steroids:  1. Start low dose basal insulin: Lantus 10 units QHS (0.1 units/kg dosing)  2. Start Novolog Meal Coverage: Novolog 6 units TID with meals (Please add the following Hold Parameters: Hold if pt eats <50% of meal, Hold if pt NPO)      --Will follow patient during hospitalization--  Wyn Quaker RN, MSN, CDE Diabetes Coordinator Inpatient Glycemic Control Team Team Pager: [phone] (8a-5p)

## 2017-08-18 LAB — GLUCOSE, CAPILLARY
GLUCOSE-CAPILLARY: 209 mg/dL — AB (ref 70–99)
GLUCOSE-CAPILLARY: 414 mg/dL — AB (ref 70–99)
GLUCOSE-CAPILLARY: 324 mg/dL — AB (ref 70–99)
GLUCOSE-CAPILLARY: 316 mg/dL — AB (ref 70–99)
Glucose-Capillary: 477 mg/dL — ABNORMAL HIGH (ref 70–99)

## 2017-08-18 MED ORDER — PREDNISONE 20 MG PO TABS
20.0000 mg | ORAL_TABLET | Freq: Every day | ORAL | Status: DC
  Administered 2017-08-19: 20 mg via ORAL
  Filled 2017-08-18: qty 1

## 2017-08-18 MED ORDER — PREDNISONE 50 MG PO TABS
50.0000 mg | ORAL_TABLET | Freq: Every day | ORAL | Status: DC
Start: 2017-08-18 — End: 2017-08-18
  Administered 2017-08-18: 50 mg via ORAL
  Filled 2017-08-18: qty 1

## 2017-08-18 NOTE — Progress Notes (Signed)
Pt ambulated in hall. 02 sat 81% on room air while ambulating. Pt denies shortness of breath while ambulating.

## 2017-08-18 NOTE — Progress Notes (Signed)
[facility] at [place] NAME: Stephanie Short    MR#:  [id number]  DATE OF BIRTH:  Feb 02, 1950  SUBJECTIVE:  CHIEF COMPLAINT:   Chief Complaint  Patient presents with  . Shortness of Breath   Sob is much improved. Afebrile On RA at rest with sats 91%  Sats dropped to 84% on ambulation  REVIEW OF SYSTEMS:  CONSTITUTIONAL: No fever, fatigue or weakness.  EYES: No blurred or double vision.  EARS, NOSE, AND THROAT: No tinnitus or ear pain.  RESPIRATORY: No cough. Has shortness of breath, no wheezing or hemoptysis.  CARDIOVASCULAR: No chest pain, orthopnea, edema.  GASTROINTESTINAL: No nausea, vomiting, diarrhea or abdominal pain.  GENITOURINARY: No dysuria, hematuria.  ENDOCRINE: No polyuria, nocturia,  HEMATOLOGY: No anemia, easy bruising or bleeding SKIN: No rash or lesion. MUSCULOSKELETAL: No joint pain or arthritis.   NEUROLOGIC: No tingling, numbness, weakness.  PSYCHIATRY: No anxiety or depression.   ROS  DRUG ALLERGIES:   Allergies  Allergen Reactions  . Codeine Nausea And Vomiting  . Dilaudid [Hydromorphone Hcl] Nausea And Vomiting  . Tramadol Nausea And Vomiting and Nausea Only  . Gabapentin Nausea And Vomiting and Other (See Comments)    Reaction:  Dizziness and GI upset  . Hydrocodone-Acetaminophen Nausea And Vomiting and Nausea Only  . Hydromorphone Nausea Only  . Oxycodone-Acetaminophen Nausea Only  . Promethazine Other (See Comments)    Other reaction(s): Other (See Comments) Reaction:  GI upset  Reaction:  GI upset   . Scallops [Shellfish Allergy] Nausea And Vomiting    Other reaction(s): Nausea And Vomiting    VITALS:  Blood pressure 134/83, pulse 99, temperature 98 F (36.7 C), temperature source Oral, resp. rate (!) 21, height _0  (1.6 m), weight 92.2 kg (203 lb 3.2 oz), SpO2 91 %.  PHYSICAL EXAMINATION:  GENERAL:  67 y.o.-year-old patient lying in the bed with no acute distress.  EYES: Pupils equal,  round, reactive to light and accommodation. No scleral icterus. Extraocular muscles intact.  HEENT: Head atraumatic, normocephalic. Oropharynx and nasopharynx clear.  NECK:  Supple, no jugular venous distention. No thyroid enlargement, no tenderness.  LUNGS: Normal breath sounds bilaterally, no wheezing, have crepitation. No use of accessory muscles of respiration.  On 4 L oxygen. CARDIOVASCULAR: S1, S2 normal. No murmurs, rubs, or gallops.  ABDOMEN: Soft, nontender, nondistended. Bowel sounds present. No organomegaly or mass.  EXTREMITIES: No pedal edema, cyanosis, or clubbing.  NEUROLOGIC: Cranial nerves II through XII are intact. Muscle strength 5/5 in all extremities. Sensation intact. Gait not checked.  PSYCHIATRIC: The patient is alert and oriented x 3.  SKIN: No obvious rash, lesion, or ulcer.   Physical Exam LABORATORY PANEL:   CBC Recent Labs  Lab 08/16/17 0331  WBC 10.8  HGB 14.0  HCT 41.4  PLT 203   ------------------------------------------------------------------------------------------------------------------  Chemistries  Recent Labs  Lab 08/13/17 1553  08/17/17 0332  NA 140   < > 141  K 4.3   < > 4.1  CL 107   < > 100  CO2 25   < > 32  GLUCOSE 197*   < > 164*  BUN 19   < > 47*  CREATININE 1.13*   < > 1.12*  CALCIUM 9.1   < > 9.3  AST 20  --   --   ALT 13  --   --   ALKPHOS 106  --   --   BILITOT 0.6  --   --    < > =  values in this interval not displayed.   ------------------------------------------------------------------------------------------------------------------  Cardiac Enzymes Recent Labs  Lab 08/13/17 2312 08/14/17 0332  TROPONINI <0.03 <0.03   ------------------------------------------------------------------------------------------------------------------  RADIOLOGY:  No results found.  ASSESSMENT AND PLAN:   Principal Problem:   Acute respiratory failure with hypoxia (HCC) Active Problems:   COPD with acute exacerbation  (HCC)   Acute diastolic CHF (congestive heart failure) (HCC)   Acute respiratory failure (HCC)   *Acute hypoxic respiratory failure  secondary to combination of diastolic CHF and COPD. She is currently requiring 4 L of supplemental oxygen Taper as tolerated  *Acute on chronic diastolic congestive heart failure CT scan chest shows some sign of edema. IV Lasix Cardiology consulted echocardiogram with bubble study. Which did not show any shunt.  * Acute COPD exacerbation On steroids and nebs Inhalers Decreased steroid dose  *Diabetes SSI Hyperglycemia due to steroids Reduced steroid dose today  *Hypertension Well controlled  *Anxiety Continue Xanax.  All the records are reviewed and case discussed with Care Management/Social Workerr. Management plans discussed with the patient, family and they are in agreement.  CODE STATUS: Full.  TOTAL TIME TAKING CARE OF THIS PATIENT: 35 minutes.   D/C home in AM with Oxygen if needed  Neita Carp M.D on 08/18/2017   Between 7am to 6pm - Pager - [phone]  After 6pm go to [email or web address] - password [email or web address] [facility]  Office  [phone]  CC: Primary care physician; Kirk Ruths, MD  Note: This dictation was prepared with Dragon dictation along with smaller phrase technology. Any transcriptional errors that result from this process are unintentional.

## 2017-08-18 NOTE — Progress Notes (Signed)
Inpatient Diabetes Program Recommendations  AACE/ADA: New Consensus Statement on Inpatient Glycemic Control (2015)  Target Ranges:  Prepandial:   less than 140 mg/dL      Peak postprandial:   less than 180 mg/dL (1-2 hours)      Critically ill patients:  140 - 180 mg/dL   Results for SOPHIE, TAMEZ (MRN [id number]) as of 08/18/2017 07:51  Ref. Range 08/17/2017 07:42 08/17/2017 12:07 08/17/2017 16:57 08/17/2017 20:51  Glucose-Capillary Latest Ref Range: 70 - 99 mg/dL 189 (H) 347 (H) 356 (H) 92   Results for KEIASHA, DIEP (MRN [id number]) as of 08/18/2017 07:51  Ref. Range 08/18/2017 07:31  Glucose-Capillary Latest Ref Range: 70 - 99 mg/dL 209 (H)    Home DM Meds: Amaryl 2 mg daily  Current Insulin Orders: Amaryl 4 mg BID                                       Novolog Resistant Correction Scale/ SSI (0-20 units) TID AC + HS     Solumedrol stopped--Last dose given today at 5am.   Now getting Prednisone 50 mg daily.  CBGs elevated.   MD- Please consider the following in-hospital insulin adjustments while patient remains on steroids:  Start Novolog Meal Coverage: Novolog 6 units TID with meals (Please add the following Hold Parameters: Hold if pt eats <50% of meal, Hold if pt NPO)    --Will follow patient during hospitalization--  Wyn Quaker RN, MSN, CDE Diabetes Coordinator Inpatient Glycemic Control Team Team Pager: [phone] (8a-5p)

## 2017-08-18 NOTE — Progress Notes (Signed)
Pt stated she was awaiting results for her sleep study settings and would not be wearing a cpap tonight.

## 2017-08-19 ENCOUNTER — Ambulatory Visit: Payer: 59 | Attending: Neurology

## 2017-08-19 DIAGNOSIS — G4733 Obstructive sleep apnea (adult) (pediatric): Secondary | ICD-10-CM | POA: Insufficient documentation

## 2017-08-19 LAB — GLUCOSE, CAPILLARY
Glucose-Capillary: 108 mg/dL — ABNORMAL HIGH (ref 70–99)
Glucose-Capillary: 397 mg/dL — ABNORMAL HIGH (ref 70–99)

## 2017-08-19 LAB — HEMOGLOBIN A1C
HEMOGLOBIN A1C: 7.7 % — AB (ref 4.8–5.6)
MEAN PLASMA GLUCOSE: 174.29 mg/dL

## 2017-08-19 MED ORDER — ACETAMINOPHEN 325 MG PO TABS: 650.0000 mg | ORAL_TABLET | Freq: Four times a day (QID) | ORAL | 0 refills | Status: AC | PRN

## 2017-08-19 MED ORDER — FUROSEMIDE 40 MG PO TABS: 40.0000 mg | ORAL_TABLET | Freq: Every day | ORAL | 2 refills | Status: AC

## 2017-08-19 MED ORDER — FELODIPINE ER 5 MG PO TB24: 5.0000 mg | ORAL_TABLET | ORAL | 2 refills | Status: AC

## 2017-08-19 MED ORDER — ZOLPIDEM TARTRATE 5 MG PO TABS: 5.0000 mg | ORAL_TABLET | Freq: Every evening | ORAL | 0 refills | Status: DC | PRN

## 2017-08-19 MED ORDER — DOXYCYCLINE HYCLATE 100 MG PO TABS: 100.0000 mg | ORAL_TABLET | Freq: Two times a day (BID) | ORAL | 0 refills | Status: AC

## 2017-08-19 MED ORDER — POTASSIUM CHLORIDE ER 20 MEQ PO TBCR: 20.0000 meq | EXTENDED_RELEASE_TABLET | Freq: Every day | ORAL | 2 refills | Status: AC

## 2017-08-19 MED ORDER — PREDNISONE 20 MG PO TABS: 40.0000 mg | ORAL_TABLET | Freq: Every day | ORAL | 0 refills | Status: AC

## 2017-08-19 MED ORDER — BENZONATATE 200 MG PO CAPS: 200.0000 mg | ORAL_CAPSULE | Freq: Three times a day (TID) | ORAL | 0 refills | Status: AC

## 2017-08-19 MED ORDER — IPRATROPIUM-ALBUTEROL 0.5-2.5 (3) MG/3ML IN SOLN: 3.0000 mL | Freq: Four times a day (QID) | RESPIRATORY_TRACT | 1 refills | Status: AC | PRN

## 2017-08-19 NOTE — Progress Notes (Signed)
SATURATION QUALIFICATIONS: (This note is used to comply with regulatory documentation for home oxygen)  Patient Saturations on Room Air at Rest = 86%   Patient Saturations on 4 Liters of oxygen while Ambulating = 93%  Please briefly explain why patient needs home oxygen: Patient cannot maintain adequate O2 saturation while resting or ambulation without O2 supplementation

## 2017-08-19 NOTE — Care Management (Signed)
Discharge to home today per Dr. Tressia Miners. Qualifies for home oxygen. Home nebulizer ordered, Floydene Flock, [facility] representative updated.  Family will transport. Shelbie Ammons RN MSN CCM Care Management [phone]

## 2017-08-19 NOTE — Discharge Summary (Signed)
[facility] [place] NAME: Stephanie Short    MR#:  [id number]  DATE OF BIRTH:  Apr 17, 1950  DATE OF ADMISSION:  08/13/2017   ADMITTING PHYSICIAN: Vaughan Basta, MD  DATE OF DISCHARGE: 08/19/2017  3:15 PM  PRIMARY CARE PHYSICIAN: Kirk Ruths, MD   ADMISSION DIAGNOSIS:   COPD exacerbation ([place]) [J44.1]  DISCHARGE DIAGNOSIS:   Principal Problem:   Acute respiratory failure with hypoxia ([place]) Active Problems:   COPD with acute exacerbation (HCC)   Acute diastolic CHF (congestive heart failure) ([place])   Acute respiratory failure ([place])   SECONDARY DIAGNOSIS:   Past Medical History:  Diagnosis Date  . Asthma   . Depression   . Diabetes mellitus   . Hepatitis C    Dr. 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 Hypertension   . Insomnia     HOSPITAL COURSE:   67 year old female with past medical history significant for diabetes, asthma, depression, hepatitis C and sleep apnea presents to hospital secondary to chest tightness and noted to have COPD exacerbation  1.  Acute  COPD exacerbation-admitted, received IV steroids.  Required supplemental oxygen. -At the time of discharge at room air she was comfortable and sats were improved.  However on ambulation still hypoxic.  Being discharged on 2 L home oxygen -Pulmonary follow-up as outpatient. -Cough meds added.  Chest x-ray with no infiltrate.  -.  Nebulizer prescription given -We will add doxycycline at discharge -On oral prednisone at discharge  2.  Diabetes mellitus with fluctuating blood sugars in the hospital.  Continue glimepiride at home. -A1c is pending.  If needed metformin can be added as outpatient  3.  Diastolic CHF-echo with normal EF,  -CT chest with increased interstitial thickening likely consistent with pulmonary edema.  Started on Lasix and potassium supplements in this hospitalization  4.  Depression anxiety-continue home  medications  For discharge today  DISCHARGE CONDITIONS:   Stable  CONSULTS OBTAINED:   Treatment Team:  Erby Pian, MD Teodoro Spray, MD  DRUG ALLERGIES:   Allergies  Allergen Reactions  . Codeine Nausea And Vomiting  . Dilaudid [Hydromorphone Hcl] Nausea And Vomiting  . Tramadol Nausea And Vomiting and Nausea Only  . Gabapentin Nausea And Vomiting and Other (See Comments)    Reaction:  Dizziness and GI upset  . Hydrocodone-Acetaminophen Nausea And Vomiting and Nausea Only  . Hydromorphone Nausea Only  . Oxycodone-Acetaminophen Nausea Only  . Promethazine Other (See Comments)    Other reaction(s): Other (See Comments) Reaction:  GI upset  Reaction:  GI upset   . Scallops [Shellfish Allergy] Nausea And Vomiting    Other reaction(s): Nausea And Vomiting   DISCHARGE MEDICATIONS:   Allergies as of 08/19/2017      Reactions   Codeine Nausea And Vomiting   Dilaudid [hydromorphone Hcl] Nausea And Vomiting   Tramadol Nausea And Vomiting, Nausea Only   Gabapentin Nausea And Vomiting, Other (See Comments)   Reaction:  Dizziness and GI upset   Hydrocodone-acetaminophen Nausea And Vomiting, Nausea Only   Hydromorphone Nausea Only   Oxycodone-acetaminophen Nausea Only   Promethazine Other (See Comments)   Other reaction(s): Other (See Comments) Reaction:  GI upset  Reaction:  GI upset    Scallops [shellfish Allergy] Nausea And Vomiting   Other reaction(s): Nausea And Vomiting      Medication List    STOP taking these medications   oxyCODONE 5 MG immediate release tablet  Commonly known as:  Oxy IR/ROXICODONE     TAKE these medications   acetaminophen 325 MG tablet Commonly known as:  TYLENOL Take 2 tablets (650 mg total) by mouth every 6 (six) hours as needed for mild pain or headache. What changed:    medication strength  how much to take  reasons to take this   albuterol 108 (90 Base) MCG/ACT inhaler Commonly known as:  PROVENTIL HFA;VENTOLIN  HFA Inhale 2 puffs into the lungs every 6 (six) hours as needed for wheezing or shortness of breath.   ALPRAZolam 0.25 MG tablet Commonly known as:  XANAX Take 0.125-0.25 mg by mouth 2 (two) times daily as needed for anxiety or sleep.   ANORO ELLIPTA 62.5-25 MCG/INH Aepb Generic drug:  umeclidinium-vilanterol Inhale 1 puff into the lungs daily.   aspirin EC 81 MG tablet Take 81 mg by mouth at bedtime.   benzonatate 200 MG capsule Commonly known as:  TESSALON PERLES Take 1 capsule (200 mg total) by mouth 3 (three) times daily for 7 days.   carvedilol 6.25 MG tablet Commonly known as:  COREG Take 6.25 mg by mouth 2 (two) times daily with a meal.   doxycycline 100 MG tablet Commonly known as:  VIBRA-TABS Take 1 tablet (100 mg total) by mouth 2 (two) times daily for 10 days.   felodipine 5 MG 24 hr tablet Commonly known as:  PLENDIL Take 1 tablet (5 mg total) by mouth every morning. What changed:    medication strength  how much to take   FIFTY50 GLUCOSE METER 2.0 w/Device Kit Use as directed. Dx E11.22 ONE TOUCH   furosemide 40 MG tablet Commonly known as:  LASIX Take 1 tablet (40 mg total) by mouth daily.   glimepiride 2 MG tablet Commonly known as:  AMARYL Take 2 mg by mouth daily with breakfast.   ipratropium-albuterol 0.5-2.5 (3) MG/3ML Soln Commonly known as:  DUONEB Take 3 mLs by nebulization every 6 (six) hours as needed (wheezing, shortness of breath).   ondansetron 4 MG disintegrating tablet Commonly known as:  ZOFRAN ODT Allow 1-2 tablets to dissolve in your mouth every 8 hours as needed for nausea/vomiting   ONE TOUCH ULTRA TEST test strip Generic drug:  glucose blood USE TO CHECK BLOOD SUGAR 1-2 TIMES DAILY AS DIRECTED. DX E11.22.   Potassium Chloride ER 20 MEQ Tbcr Take 20 mEq by mouth daily. While taking lasix   predniSONE 20 MG tablet Commonly known as:  DELTASONE Take 2 tablets (40 mg total) by mouth daily for 5 days.   ranitidine 150 MG  tablet Commonly known as:  ZANTAC TAKE 1 TABLET (150 MG TOTAL) BY MOUTH 2 (TWO) TIMES DAILY. AS NEEDED   vitamin B-12 1000 MCG tablet Commonly known as:  CYANOCOBALAMIN Take 1 tablet (1,000 mcg total) by mouth daily.   zolpidem 5 MG tablet Commonly known as:  AMBIEN Take 1 tablet (5 mg total) by mouth at bedtime as needed for up to 10 doses for sleep.            Durable Medical Equipment  (From admission, onward)        Start     Ordered   08/19/17 1151  For home use only DME oxygen  Once    Question Answer Comment  Mode or (Route) Nasal cannula   Liters per Minute 2   Frequency Continuous (stationary and portable oxygen unit needed)   Oxygen conserving device Yes   Oxygen delivery system Gas  08/19/17 1150   08/19/17 1026  For home use only DME Nebulizer machine  Once    Question:  Patient needs a nebulizer to treat with the following condition  Answer:  COPD (chronic obstructive pulmonary disease) ([place])   08/19/17 1025       DISCHARGE INSTRUCTIONS:   1.  PCP follow-up in 1 to 2 weeks 2.  Pulmonary follow-up in 2 weeks  DIET:   Diabetic diet  ACTIVITY:   Activity as tolerated  OXYGEN:   Home Oxygen: Yes.    Oxygen Delivery: 2 liters/min via Patient connected to nasal cannula oxygen  DISCHARGE LOCATION:   home   If you experience worsening of your admission symptoms, develop shortness of breath, life threatening emergency, suicidal or homicidal thoughts you must seek medical attention immediately by calling 911 or calling your MD immediately  if symptoms less severe.  You Must read complete instructions/literature along with all the possible adverse reactions/side effects for all the Medicines you take and that have been prescribed to you. Take any new Medicines after you have completely understood and accpet all the possible adverse reactions/side effects.   Please note  You were cared for by a hospitalist during your hospital stay. If you have  any questions about your discharge medications or the care you received while you were in the hospital after you are discharged, you can call the unit and asked to speak with the hospitalist on call if the hospitalist that took care of you is not available. Once you are discharged, your primary care physician will handle any further medical issues. Please note that NO REFILLS for any discharge medications will be authorized once you are discharged, as it is imperative that you return to your primary care physician (or establish a relationship with a primary care physician if you do not have one) for your aftercare needs so that they can reassess your need for medications and monitor your lab values.    On the day of Discharge:  VITAL SIGNS:   Blood pressure 112/66, pulse 91, temperature 98 F (36.7 C), temperature source Oral, resp. rate (!) 22, height _0  (1.6 m), weight 92.2 kg (203 lb 3.2 oz), SpO2 93 %.  PHYSICAL EXAMINATION:    GENERAL:  67 y.o.-year-old patient lying in the bed with no acute distress.  EYES: Pupils equal, round, reactive to light and accommodation. No scleral icterus. Extraocular muscles intact.  HEENT: Head atraumatic, normocephalic. Oropharynx and nasopharynx clear.  NECK:  Supple, no jugular venous distention. No thyroid enlargement, no tenderness.  LUNGS: moving air bilaterally, scant breath sounds with occasional scattered wheezing, No rales,rhonchi or crepitation. No use of accessory muscles of respiration.  CARDIOVASCULAR: S1, S2 normal. No murmurs, rubs, or gallops.  ABDOMEN: Soft, non-tender, non-distended. Bowel sounds present. No organomegaly or mass.  EXTREMITIES: No pedal edema, cyanosis, or clubbing.  NEUROLOGIC: Cranial nerves II through XII are intact. Muscle strength 5/5 in all extremities. Sensation intact. Gait not checked.  PSYCHIATRIC: The patient is alert and oriented x 3.  SKIN: No obvious rash, lesion, or ulcer.   DATA REVIEW:   CBC Recent  Labs  Lab 08/16/17 0331  WBC 10.8  HGB 14.0  HCT 41.4  PLT 203    Chemistries  Recent Labs  Lab 08/13/17 1553  08/17/17 0332  NA 140   < > 141  K 4.3   < > 4.1  CL 107   < > 100  CO2 25   < > 32  GLUCOSE 197*   < > 164*  BUN 19   < > 47*  CREATININE 1.13*   < > 1.12*  CALCIUM 9.1   < > 9.3  AST 20  --   --   ALT 13  --   --   ALKPHOS 106  --   --   BILITOT 0.6  --   --    < > = values in this interval not displayed.     Microbiology Results  No results found for this or any previous visit.  RADIOLOGY:  No results found.   Management plans discussed with the patient, family and they are in agreement.  CODE STATUS:     Code Status Orders  (From admission, onward)        Start     Ordered   08/13/17 2051  Full code  Continuous     08/13/17 2050    Code Status History    This patient has a current code status but no historical code status.      TOTAL TIME TAKING CARE OF THIS PATIENT: 38 minutes.    Gladstone Lighter M.D on 08/19/2017 at 4:03 PM  Between 7am to 6pm - Pager - [phone]  After 6pm go to [email or web address] - [employer] [facility]  Office  [phone]  CC: Primary care physician; Kirk Ruths, MD   Note: This dictation was prepared with Dragon dictation along with smaller phrase technology. Any transcriptional errors that result from this process are unintentional.

## 2017-08-26 ENCOUNTER — Encounter: Payer: Self-pay | Admitting: Family

## 2017-08-26 ENCOUNTER — Ambulatory Visit: Payer: 59 | Attending: Family | Admitting: Family

## 2017-08-26 VITALS — BP 141/99 | HR 74 | Resp 18 | Ht 63.0 in | Wt 190.5 lb

## 2017-08-26 DIAGNOSIS — I10 Essential (primary) hypertension: Secondary | ICD-10-CM

## 2017-08-26 DIAGNOSIS — Z87891 Personal history of nicotine dependence: Secondary | ICD-10-CM | POA: Insufficient documentation

## 2017-08-26 DIAGNOSIS — E119 Type 2 diabetes mellitus without complications: Secondary | ICD-10-CM | POA: Diagnosis not present

## 2017-08-26 DIAGNOSIS — Z7982 Long term (current) use of aspirin: Secondary | ICD-10-CM | POA: Insufficient documentation

## 2017-08-26 DIAGNOSIS — Z79899 Other long term (current) drug therapy: Secondary | ICD-10-CM | POA: Diagnosis not present

## 2017-08-26 DIAGNOSIS — J410 Simple chronic bronchitis: Secondary | ICD-10-CM

## 2017-08-26 DIAGNOSIS — Z885 Allergy status to narcotic agent status: Secondary | ICD-10-CM | POA: Insufficient documentation

## 2017-08-26 DIAGNOSIS — J449 Chronic obstructive pulmonary disease, unspecified: Secondary | ICD-10-CM | POA: Insufficient documentation

## 2017-08-26 DIAGNOSIS — Z7984 Long term (current) use of oral hypoglycemic drugs: Secondary | ICD-10-CM | POA: Insufficient documentation

## 2017-08-26 DIAGNOSIS — Z888 Allergy status to other drugs, medicaments and biological substances status: Secondary | ICD-10-CM | POA: Diagnosis not present

## 2017-08-26 DIAGNOSIS — I509 Heart failure, unspecified: Secondary | ICD-10-CM | POA: Diagnosis not present

## 2017-08-26 DIAGNOSIS — I5032 Chronic diastolic (congestive) heart failure: Secondary | ICD-10-CM

## 2017-08-26 DIAGNOSIS — I110 Hypertensive heart disease with heart failure: Secondary | ICD-10-CM | POA: Insufficient documentation

## 2017-08-26 DIAGNOSIS — Z9889 Other specified postprocedural states: Secondary | ICD-10-CM | POA: Diagnosis not present

## 2017-08-26 NOTE — Progress Notes (Signed)
Patient ID: Stephanie Short, female    DOB: May 20, 1950, 67 y.o.   MRN: [id number]  HPI  Stephanie Short is a 67 y/o female with a history of asthma, DM, HTN, COPD, depression, previous tobacco use and chronic heart failure.   Echo report from 08/14/17 reviewed and showed an EF of 60-65% along with mild TR.   Admitted 08/13/17 due to COPD exacerbation. Cardiology consult obtained. Received IV steroids and supplemental oxygen. Antibiotics and prednisone given at discharge. Discharged after 6 days.   She presents today for her initial visit with a chief complaint of moderate fatigue upon minimal exertion. She describes this as being recent over the last few months. She has associated cough, shortness of breath, dizziness and difficulty sleeping along with this. She denies any abdominal distention, palpitations, pedal edema, chest pain, wheezing or weight gain.   Past Medical History:  Diagnosis Date  . Asthma   . CHF (congestive heart failure) ([place])   . Depression   . Diabetes mellitus   . Hepatitis C    Dr. 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 Hypertension   . Insomnia    Past Surgical History:  Procedure Laterality Date  . BACK SURGERY  2013   [facility] Dr. Mauri Pole  . COLONOSCOPY W/ POLYPECTOMY    . COLONOSCOPY WITH PROPOFOL N/A 03/13/2015   Procedure: COLONOSCOPY WITH PROPOFOL;  Surgeon: Lollie Sails, MD;  Location: [facility];  Service: Endoscopy;  Laterality: N/A;  . HYSTEROSCOPY W/D&C N/A 08/18/2014   Procedure: DILATATION AND CURETTAGE /HYSTEROSCOPY;  Surgeon: Lorette Ang, MD;  Location: [facility] ORS;  Service: Gynecology;  Laterality: N/A;  . KNEE ARTHROSCOPY Right    Family History  Problem Relation Age of Onset  . Coronary artery disease Mother 30  . Coronary artery disease Brother 52  . Heart attack Brother        MI  . Heart disease Father   . Diabetes Father   . Diabetes Other   . Hypertension Other   . Colon cancer Other    Social History   Tobacco  Use  . Smoking status: Former Smoker    Packs/day: 1.00    Years: 20.00    Pack years: 20.00    Last attempt to quit: 01/21/2004    Years since quitting: 13.6  . Smokeless tobacco: Never Used  Substance Use Topics  . Alcohol use: Yes    Alcohol/week: 0.0 - 0.6 oz    Comment: Social   Allergies  Allergen Reactions  . Codeine Nausea And Vomiting  . Dilaudid [Hydromorphone Hcl] Nausea And Vomiting  . Tramadol Nausea And Vomiting and Nausea Only  . Gabapentin Nausea And Vomiting and Other (See Comments)    Reaction:  Dizziness and GI upset  . Hydrocodone-Acetaminophen Nausea And Vomiting and Nausea Only  . Hydromorphone Nausea Only  . Oxycodone-Acetaminophen Nausea Only  . Promethazine Other (See Comments)    Other reaction(s): Other (See Comments) Reaction:  GI upset  Reaction:  GI upset   . Scallops [Shellfish Allergy] Nausea And Vomiting    Other reaction(s): Nausea And Vomiting   Prior to Admission medications   Medication Sig Start Date End Date Taking? Authorizing Provider  acetaminophen (TYLENOL) 325 MG tablet Take 2 tablets (650 mg total) by mouth every 6 (six) hours as needed for mild pain or headache. 08/19/17  Yes Gladstone Lighter, MD  aspirin EC 81 MG tablet Take 81 mg by mouth at bedtime.   Yes  [provider]  benzonatate (TESSALON) 200 MG capsule Take 1 capsule (200 mg total) by mouth 3 (three) times daily for 7 days. 08/19/17 08/26/17 Yes Gladstone Lighter, MD  carvedilol (COREG) 6.25 MG tablet Take 6.25 mg by mouth 2 (two) times daily with a meal.   Yes [provider]  doxycycline (VIBRA-TABS) 100 MG tablet Take 1 tablet (100 mg total) by mouth 2 (two) times daily for 10 days. 08/19/17 08/29/17 Yes Gladstone Lighter, MD  felodipine (PLENDIL) 5 MG 24 hr tablet Take 1 tablet (5 mg total) by mouth every morning. 08/19/17  Yes Gladstone Lighter, MD  furosemide (LASIX) 40 MG tablet Take 1 tablet (40 mg total) by mouth daily. 08/19/17  Yes Gladstone Lighter, MD  glimepiride (AMARYL) 2 MG tablet Take 2 mg by mouth daily with breakfast.   Yes [provider]  ipratropium-albuterol (DUONEB) 0.5-2.5 (3) MG/3ML SOLN Take 3 mLs by nebulization every 6 (six) hours as needed (wheezing, shortness of breath). 08/19/17  Yes Gladstone Lighter, MD  ondansetron (ZOFRAN ODT) 4 MG disintegrating tablet Allow 1-2 tablets to dissolve in your mouth every 8 hours as needed for nausea/vomiting 08/11/17  Yes Stephanie Francois, NP  ONE TOUCH ULTRA TEST test strip USE TO CHECK BLOOD SUGAR 1-2 TIMES DAILY AS DIRECTED. DX E11.22. 02/22/16  Yes [provider]  potassium chloride 20 MEQ TBCR Take 20 mEq by mouth daily. While taking lasix 08/19/17  Yes Gladstone Lighter, MD  ranitidine (ZANTAC) 150 MG tablet TAKE 1 TABLET (150 MG TOTAL) BY MOUTH 2 (TWO) TIMES DAILY. AS NEEDED 02/25/16  Yes [provider]  umeclidinium-vilanterol (ANORO ELLIPTA) 62.5-25 MCG/INH AEPB Inhale 1 puff into the lungs daily.  07/14/17  Yes [provider]  vitamin B-12 (CYANOCOBALAMIN) 1000 MCG tablet Take 1 tablet (1,000 mcg total) by mouth daily. 08/11/17  Yes Stephanie Francois, NP  albuterol (PROVENTIL HFA;VENTOLIN HFA) 108 (90 Base) MCG/ACT inhaler Inhale 2 puffs into the lungs every 6 (six) hours as needed for wheezing or shortness of breath.    [provider]  ALPRAZolam Duanne Moron) 0.25 MG tablet Take 0.125-0.25 mg by mouth 2 (two) times daily as needed for anxiety or sleep.     [provider]  Blood Glucose Monitoring Suppl (FIFTY50 GLUCOSE METER 2.0) w/Device KIT Use as directed. Dx E11.22 ONE TOUCH 02/22/16   [provider]    Review of Systems  Constitutional: Positive for fatigue (tire easily). Negative for appetite change.  HENT: Negative for congestion, postnasal drip and sore throat.   Eyes: Negative.   Respiratory: Positive for cough (better) and shortness of breath. Negative for chest tightness and wheezing.   Cardiovascular:  Negative for chest pain, palpitations and leg swelling.  Gastrointestinal: Negative for abdominal distention and abdominal pain.  Endocrine: Negative.   Genitourinary: Negative.   Musculoskeletal: Negative for back pain and neck pain.  Skin: Negative.   Allergic/Immunologic: Negative.   Neurological: Positive for dizziness and light-headedness.  Hematological: Negative for adenopathy. Does not bruise/bleed easily.  Psychiatric/Behavioral: Positive for sleep disturbance (wearing oxygen 2L around the clock). Negative for dysphoric mood. The patient is not nervous/anxious.     Vitals:   08/26/17 1221  BP: (!) 141/99  Pulse: 74  Resp: 18  SpO2: 98%  Weight: 190 lb 8 oz (86.4 kg)  Height: _0  (1.6 m)   Wt Readings from Last 3 Encounters:  08/26/17 190 lb 8 oz (86.4 kg)  08/13/17 203 lb 3.2 oz (92.2 kg)  07/30/17  190 lb (86.2 kg)    Lab Results  Component Value Date   CREATININE 1.12 (H) 08/17/2017   CREATININE 0.99 08/16/2017   CREATININE 1.18 (H) 08/14/2017   Physical Exam  Constitutional: She is oriented to person, place, and time. She appears well-developed and well-nourished.  HENT:  Head: Normocephalic and atraumatic.  Neck: Normal range of motion. Neck supple. No JVD present.  Cardiovascular: Normal rate and regular rhythm.  Pulmonary/Chest: Effort normal. No respiratory distress. She has no wheezes. She has no rales.  Abdominal: Soft. She exhibits no distension.  Musculoskeletal:       Right lower leg: She exhibits no tenderness and no edema.       Left lower leg: She exhibits no tenderness and no edema.  Neurological: She is alert and oriented to person, place, and time.  Skin: Skin is warm and dry.  Psychiatric: She has a normal mood and affect. Her behavior is normal.  Nursing note and vitals reviewed.   Assessment & Plan:  1: Chronic heart failure with preserved ejection fraction- - NHYA class III - euvolemic today - weighing daily and she was instructed  to call for an overnight weight gain of > 2 pounds or a weekly weight gain of >5 pounds - was adding salt to her food and does cook with it. Reviewed the importance of closely following a 2040m sodium diet and written information was given to her about this - saw cardiology (Clayborn Bigness 07/31/17 - BNP 08/17/17 was 61.0 - recently had CPAP titration study done and is waiting on equipment  2: HTN- - BP mildly elevated today; continue to monitor - saw PCP (Ouida Sills 07/01/17 - BMP 08/17/17 reviewed and showed sodium 141, potassium 4.1 and GFR 58  3: DM- - glucose at home this morning was 140 - A1c 08/19/17 was 7.7%  4: COPD- - wearing oxygen at 2L around the clock - discussed pulmonary rehab with patient and she is interested to referral was made - saw pulmonology ([name] 07/14/17  Medication list was reviewed.  Patient opts to not make a return appointment at this time. Advised patient and her husband that they could call back at anytime to make another appointment.

## 2017-08-26 NOTE — Patient Instructions (Addendum)
Continue weighing daily and call for an overnight weight gain of > 2 pounds or a weekly weight gain of >5 pounds.   Low-Sodium Eating Plan Sodium, which is an element that makes up salt, helps you maintain a healthy balance of fluids in your body. Too much sodium can increase your blood pressure and cause fluid and waste to be held in your body. Your health care provider or dietitian may recommend following this plan if you have high blood pressure (hypertension), kidney disease, liver disease, or heart failure. Eating less sodium can help lower your blood pressure, reduce swelling, and protect your heart, liver, and kidneys. What are tips for following this plan? General guidelines  Most people on this plan should limit their sodium intake to 2,000 mg (milligrams) of sodium each day. Reading food labels  The Nutrition Facts label lists the amount of sodium in one serving of the food. If you eat more than one serving, you must multiply the listed amount of sodium by the number of servings.  Choose foods with less than 140 mg of sodium per serving.  Avoid foods with 300 mg of sodium or more per serving. Shopping  Look for lower-sodium products, often labeled as "low-sodium" or "no salt added."  Always check the sodium content even if foods are labeled as "unsalted" or "no salt added".  Buy fresh foods. ? Avoid canned foods and premade or frozen meals. ? Avoid canned, cured, or processed meats  Buy breads that have less than 80 mg of sodium per slice. Cooking  Eat more home-cooked food and less restaurant, buffet, and fast food.  Avoid adding salt when cooking. Use salt-free seasonings or herbs instead of table salt or sea salt. Check with your health care provider or pharmacist before using salt substitutes.  Cook with plant-based oils, such as canola, sunflower, or olive oil. Meal planning  When eating at a restaurant, ask that your food be prepared with less salt or no salt, if  possible.  Avoid foods that contain MSG (monosodium glutamate). MSG is sometimes added to [place] food, bouillon, and some canned foods. What foods are recommended? The items listed may not be a complete list. Talk with your dietitian about what dietary choices are best for you. Grains Low-sodium cereals, including oats, puffed wheat and rice, and shredded wheat. Low-sodium crackers. Unsalted rice. Unsalted pasta. Low-sodium bread. Whole-grain breads and whole-grain pasta. Vegetables Fresh or frozen vegetables. "No salt added" canned vegetables. "No salt added" tomato sauce and paste. Low-sodium or reduced-sodium tomato and vegetable juice. Fruits Fresh, frozen, or canned fruit. Fruit juice. Meats and other protein foods Fresh or frozen (no salt added) meat, poultry, seafood, and fish. Low-sodium canned tuna and salmon. Unsalted nuts. Dried peas, beans, and lentils without added salt. Unsalted canned beans. Eggs. Unsalted nut butters. Dairy Milk. Soy milk. Cheese that is naturally low in sodium, such as ricotta cheese, fresh mozzarella, or Swiss cheese Low-sodium or reduced-sodium cheese. Cream cheese. Yogurt. Fats and oils Unsalted butter. Unsalted margarine with no trans fat. Vegetable oils such as canola or olive oils. Seasonings and other foods Fresh and dried herbs and spices. Salt-free seasonings. Low-sodium mustard and ketchup. Sodium-free salad dressing. Sodium-free light mayonnaise. Fresh or refrigerated horseradish. Lemon juice. Vinegar. Homemade, reduced-sodium, or low-sodium soups. Unsalted popcorn and pretzels. Low-salt or salt-free chips. What foods are not recommended? The items listed may not be a complete list. Talk with your dietitian about what dietary choices are best for you. Grains Instant hot cereals.  Bread stuffing, pancake, and biscuit mixes. Croutons. Seasoned rice or pasta mixes. Noodle soup cups. Boxed or frozen macaroni and cheese. Regular salted crackers.  Self-rising flour. Vegetables Sauerkraut, pickled vegetables, and relishes. Olives. [place] fries. Onion rings. Regular canned vegetables (not low-sodium or reduced-sodium). Regular canned tomato sauce and paste (not low-sodium or reduced-sodium). Regular tomato and vegetable juice (not low-sodium or reduced-sodium). Frozen vegetables in sauces. Meats and other protein foods Meat or fish that is salted, canned, smoked, spiced, or pickled. Bacon, ham, sausage, hotdogs, corned beef, chipped beef, packaged lunch meats, salt pork, jerky, pickled herring, anchovies, regular canned tuna, sardines, salted nuts. Dairy Processed cheese and cheese spreads. Cheese curds. Blue cheese. Feta cheese. String cheese. Regular cottage cheese. Buttermilk. Canned milk. Fats and oils Salted butter. Regular margarine. Ghee. Bacon fat. Seasonings and other foods Onion salt, garlic salt, seasoned salt, table salt, and sea salt. Canned and packaged gravies. Worcestershire sauce. Tartar sauce. Barbecue sauce. Teriyaki sauce. Soy sauce, including reduced-sodium. Steak sauce. Fish sauce. Oyster sauce. Cocktail sauce. Horseradish that you find on the shelf. Regular ketchup and mustard. Meat flavorings and tenderizers. Bouillon cubes. Hot sauce and Tabasco sauce. Premade or packaged marinades. Premade or packaged taco seasonings. Relishes. Regular salad dressings. Salsa. Potato and tortilla chips. Corn chips and puffs. Salted popcorn and pretzels. Canned or dried soups. Pizza. Frozen entrees and pot pies. Summary  Eating less sodium can help lower your blood pressure, reduce swelling, and protect your heart, liver, and kidneys.  Most people on this plan should limit their sodium intake to 1,500-2,000 mg (milligrams) of sodium each day.  Canned, boxed, and frozen foods are high in sodium. Restaurant foods, fast foods, and pizza are also very high in sodium. You also get sodium by adding salt to food.  Try to cook at home, eat  more fresh fruits and vegetables, and eat less fast food, canned, processed, or prepared foods. This information is not intended to replace advice given to you by your health care provider. Make sure you discuss any questions you have with your health care provider. Document Released: 06/28/2001 Document Revised: 12/31/2015 Document Reviewed: 12/31/2015 Elsevier Interactive Patient Education  [employer].

## 2017-09-22 ENCOUNTER — Other Ambulatory Visit: Payer: Self-pay | Admitting: Internal Medicine

## 2017-09-22 DIAGNOSIS — I2510 Atherosclerotic heart disease of native coronary artery without angina pectoris: Secondary | ICD-10-CM | POA: Diagnosis not present

## 2017-09-22 DIAGNOSIS — E1122 Type 2 diabetes mellitus with diabetic chronic kidney disease: Secondary | ICD-10-CM | POA: Diagnosis not present

## 2017-09-22 DIAGNOSIS — I208 Other forms of angina pectoris: Secondary | ICD-10-CM | POA: Diagnosis not present

## 2017-09-22 DIAGNOSIS — I10 Essential (primary) hypertension: Secondary | ICD-10-CM | POA: Diagnosis not present

## 2017-09-22 DIAGNOSIS — E6609 Other obesity due to excess calories: Secondary | ICD-10-CM | POA: Diagnosis not present

## 2017-09-22 DIAGNOSIS — F325 Major depressive disorder, single episode, in full remission: Secondary | ICD-10-CM | POA: Diagnosis not present

## 2017-09-22 DIAGNOSIS — R0602 Shortness of breath: Secondary | ICD-10-CM | POA: Diagnosis not present

## 2017-09-22 DIAGNOSIS — R0902 Hypoxemia: Secondary | ICD-10-CM | POA: Diagnosis not present

## 2017-09-22 DIAGNOSIS — I272 Pulmonary hypertension, unspecified: Secondary | ICD-10-CM | POA: Diagnosis not present

## 2017-09-24 DIAGNOSIS — I129 Hypertensive chronic kidney disease with stage 1 through stage 4 chronic kidney disease, or unspecified chronic kidney disease: Secondary | ICD-10-CM | POA: Diagnosis not present

## 2017-09-24 DIAGNOSIS — E1122 Type 2 diabetes mellitus with diabetic chronic kidney disease: Secondary | ICD-10-CM | POA: Diagnosis not present

## 2017-09-24 DIAGNOSIS — E1169 Type 2 diabetes mellitus with other specified complication: Secondary | ICD-10-CM | POA: Diagnosis not present

## 2017-09-24 DIAGNOSIS — E1165 Type 2 diabetes mellitus with hyperglycemia: Secondary | ICD-10-CM | POA: Diagnosis not present

## 2017-09-24 DIAGNOSIS — E785 Hyperlipidemia, unspecified: Secondary | ICD-10-CM | POA: Diagnosis not present

## 2017-09-29 ENCOUNTER — Encounter
Admission: RE | Disposition: A | Discharge status: Home / Self Care | Payer: Self-pay | Source: Ambulatory Visit | Attending: Cardiology

## 2017-09-29 ENCOUNTER — Ambulatory Visit
Admission: RE | Admit: 2017-09-29 | Discharge: 2017-09-29 | Disposition: A | Discharge status: Home / Self Care | Payer: 59 | Source: Ambulatory Visit | Attending: Cardiology | Admitting: Cardiology

## 2017-09-29 ENCOUNTER — Other Ambulatory Visit: Payer: Self-pay | Admitting: Cardiology

## 2017-09-29 DIAGNOSIS — Z885 Allergy status to narcotic agent status: Secondary | ICD-10-CM | POA: Insufficient documentation

## 2017-09-29 DIAGNOSIS — Z9889 Other specified postprocedural states: Secondary | ICD-10-CM | POA: Diagnosis not present

## 2017-09-29 DIAGNOSIS — N182 Chronic kidney disease, stage 2 (mild): Secondary | ICD-10-CM | POA: Diagnosis not present

## 2017-09-29 DIAGNOSIS — Z7951 Long term (current) use of inhaled steroids: Secondary | ICD-10-CM | POA: Diagnosis not present

## 2017-09-29 DIAGNOSIS — E785 Hyperlipidemia, unspecified: Secondary | ICD-10-CM

## 2017-09-29 DIAGNOSIS — I2584 Coronary atherosclerosis due to calcified coronary lesion: Secondary | ICD-10-CM | POA: Insufficient documentation

## 2017-09-29 DIAGNOSIS — E669 Obesity, unspecified: Secondary | ICD-10-CM | POA: Diagnosis not present

## 2017-09-29 DIAGNOSIS — I272 Pulmonary hypertension, unspecified: Secondary | ICD-10-CM | POA: Diagnosis not present

## 2017-09-29 DIAGNOSIS — I129 Hypertensive chronic kidney disease with stage 1 through stage 4 chronic kidney disease, or unspecified chronic kidney disease: Secondary | ICD-10-CM | POA: Diagnosis not present

## 2017-09-29 DIAGNOSIS — Z87891 Personal history of nicotine dependence: Secondary | ICD-10-CM | POA: Insufficient documentation

## 2017-09-29 DIAGNOSIS — G4700 Insomnia, unspecified: Secondary | ICD-10-CM | POA: Insufficient documentation

## 2017-09-29 DIAGNOSIS — Z7982 Long term (current) use of aspirin: Secondary | ICD-10-CM | POA: Diagnosis not present

## 2017-09-29 DIAGNOSIS — Z888 Allergy status to other drugs, medicaments and biological substances status: Secondary | ICD-10-CM | POA: Insufficient documentation

## 2017-09-29 DIAGNOSIS — Z886 Allergy status to analgesic agent status: Secondary | ICD-10-CM | POA: Diagnosis not present

## 2017-09-29 DIAGNOSIS — E7800 Pure hypercholesterolemia, unspecified: Secondary | ICD-10-CM | POA: Insufficient documentation

## 2017-09-29 DIAGNOSIS — E1122 Type 2 diabetes mellitus with diabetic chronic kidney disease: Secondary | ICD-10-CM | POA: Insufficient documentation

## 2017-09-29 DIAGNOSIS — Z833 Family history of diabetes mellitus: Secondary | ICD-10-CM | POA: Insufficient documentation

## 2017-09-29 DIAGNOSIS — J449 Chronic obstructive pulmonary disease, unspecified: Secondary | ICD-10-CM | POA: Insufficient documentation

## 2017-09-29 DIAGNOSIS — Z85038 Personal history of other malignant neoplasm of large intestine: Secondary | ICD-10-CM | POA: Insufficient documentation

## 2017-09-29 DIAGNOSIS — R0602 Shortness of breath: Secondary | ICD-10-CM | POA: Insufficient documentation

## 2017-09-29 DIAGNOSIS — Z6833 Body mass index (BMI) 33.0-33.9, adult: Secondary | ICD-10-CM | POA: Diagnosis not present

## 2017-09-29 DIAGNOSIS — I2510 Atherosclerotic heart disease of native coronary artery without angina pectoris: Secondary | ICD-10-CM | POA: Insufficient documentation

## 2017-09-29 DIAGNOSIS — Z86010 Personal history of colonic polyps: Secondary | ICD-10-CM | POA: Insufficient documentation

## 2017-09-29 DIAGNOSIS — F325 Major depressive disorder, single episode, in full remission: Secondary | ICD-10-CM | POA: Diagnosis not present

## 2017-09-29 DIAGNOSIS — I5031 Acute diastolic (congestive) heart failure: Secondary | ICD-10-CM | POA: Diagnosis not present

## 2017-09-29 DIAGNOSIS — Z91013 Allergy to seafood: Secondary | ICD-10-CM | POA: Insufficient documentation

## 2017-09-29 DIAGNOSIS — Z79899 Other long term (current) drug therapy: Secondary | ICD-10-CM | POA: Diagnosis not present

## 2017-09-29 DIAGNOSIS — Z8249 Family history of ischemic heart disease and other diseases of the circulatory system: Secondary | ICD-10-CM | POA: Insufficient documentation

## 2017-09-29 DIAGNOSIS — Z823 Family history of stroke: Secondary | ICD-10-CM | POA: Insufficient documentation

## 2017-09-29 DIAGNOSIS — E1169 Type 2 diabetes mellitus with other specified complication: Secondary | ICD-10-CM | POA: Insufficient documentation

## 2017-09-29 HISTORY — PX: RIGHT/LEFT HEART CATH AND CORONARY ANGIOGRAPHY: CATH118266

## 2017-09-29 LAB — GLUCOSE, CAPILLARY: Glucose-Capillary: 154 mg/dL — ABNORMAL HIGH (ref 70–99)

## 2017-09-29 SURGERY — ECHOCARDIOGRAM, TRANSESOPHAGEAL
Anesthesia: Moderate Sedation

## 2017-09-29 SURGERY — RIGHT/LEFT HEART CATH AND CORONARY ANGIOGRAPHY
Anesthesia: Moderate Sedation

## 2017-09-29 MED ORDER — SODIUM CHLORIDE 0.9 % IV SOLN
INTRAVENOUS | Status: DC
  Administered 2017-09-29: 1000 mL via INTRAVENOUS

## 2017-09-29 MED ORDER — HEPARIN (PORCINE) IN NACL 1000-0.9 UT/500ML-% IV SOLN
INTRAVENOUS | Status: AC
  Filled 2017-09-29: qty 1000

## 2017-09-29 MED ORDER — FENTANYL CITRATE (PF) 100 MCG/2ML IJ SOLN
INTRAMUSCULAR | Status: AC
  Filled 2017-09-29: qty 2

## 2017-09-29 MED ORDER — IOPAMIDOL (ISOVUE-300) INJECTION 61%
INTRAVENOUS | Status: DC | PRN
  Administered 2017-09-29: 105 mL via INTRA_ARTERIAL

## 2017-09-29 MED ORDER — MIDAZOLAM HCL 5 MG/5ML IJ SOLN
INTRAMUSCULAR | Status: AC | PRN
  Administered 2017-09-29: 2 mg via INTRAVENOUS
  Administered 2017-09-29 (×2): 1 mg via INTRAVENOUS

## 2017-09-29 MED ORDER — MIDAZOLAM HCL 2 MG/2ML IJ SOLN
INTRAMUSCULAR | Status: AC
  Filled 2017-09-29: qty 2

## 2017-09-29 MED ORDER — FENTANYL CITRATE (PF) 100 MCG/2ML IJ SOLN
INTRAMUSCULAR | Status: DC | PRN
  Administered 2017-09-29: 50 ug via INTRAVENOUS

## 2017-09-29 MED ORDER — ONDANSETRON HCL 4 MG/2ML IJ SOLN
INTRAMUSCULAR | Status: AC
  Filled 2017-09-29: qty 2

## 2017-09-29 MED ORDER — SODIUM CHLORIDE 0.9 % IV SOLN: 250.0000 mL | INTRAVENOUS | Status: DC | PRN

## 2017-09-29 MED ORDER — FENTANYL CITRATE (PF) 100 MCG/2ML IJ SOLN
INTRAMUSCULAR | Status: AC | PRN
  Administered 2017-09-29: 50 ug via INTRAVENOUS
  Administered 2017-09-29 (×2): 25 ug via INTRAVENOUS

## 2017-09-29 MED ORDER — ACETAMINOPHEN 325 MG PO TABS: 650.0000 mg | ORAL_TABLET | ORAL | Status: DC | PRN

## 2017-09-29 MED ORDER — LIDOCAINE VISCOUS HCL 2 % MT SOLN
OROMUCOSAL | Status: AC
  Filled 2017-09-29: qty 15

## 2017-09-29 MED ORDER — SODIUM CHLORIDE 0.9% FLUSH: 3.0000 mL | INTRAVENOUS | Status: DC | PRN

## 2017-09-29 MED ORDER — MIDAZOLAM HCL 5 MG/5ML IJ SOLN
INTRAMUSCULAR | Status: AC
  Filled 2017-09-29: qty 5

## 2017-09-29 MED ORDER — ONDANSETRON HCL 4 MG/2ML IJ SOLN
4.0000 mg | Freq: Four times a day (QID) | INTRAMUSCULAR | Status: DC | PRN
  Administered 2017-09-29: 4 mg via INTRAVENOUS

## 2017-09-29 MED ORDER — BUTAMBEN-TETRACAINE-BENZOCAINE 2-2-14 % EX AERO
INHALATION_SPRAY | CUTANEOUS | Status: AC
  Filled 2017-09-29: qty 5

## 2017-09-29 MED ORDER — SODIUM CHLORIDE FLUSH 0.9 % IV SOLN
INTRAVENOUS | Status: AC
  Filled 2017-09-29: qty 10

## 2017-09-29 MED ORDER — SODIUM CHLORIDE 0.9% FLUSH: 3.0000 mL | Freq: Two times a day (BID) | INTRAVENOUS | Status: DC

## 2017-09-29 MED ORDER — SODIUM CHLORIDE 0.9 % WEIGHT BASED INFUSION: 1.0000 mL/kg/h | INTRAVENOUS | Status: DC

## 2017-09-29 MED ORDER — MIDAZOLAM HCL 2 MG/2ML IJ SOLN
INTRAMUSCULAR | Status: DC | PRN
  Administered 2017-09-29 (×2): 1 mg via INTRAVENOUS

## 2017-09-29 SURGICAL SUPPLY — 12 items
CATH INFINITI 5FR ANG PIGTAIL (CATHETERS) ×3 IMPLANT
CATH INFINITI 5FR JL4 (CATHETERS) ×3 IMPLANT
CATH INFINITI JR4 5F (CATHETERS) ×3 IMPLANT
CATH SWANZ 7F THERMO (CATHETERS) ×3 IMPLANT
DEVICE CLOSURE MYNXGRIP 5F (Vascular Products) ×3 IMPLANT
KIT MANI 3VAL PERCEP (MISCELLANEOUS) ×3 IMPLANT
KIT RIGHT HEART (MISCELLANEOUS) ×3 IMPLANT
NEEDLE PERC 18GX7CM (NEEDLE) ×3 IMPLANT
PACK CARDIAC CATH (CUSTOM PROCEDURE TRAY) ×3 IMPLANT
SHEATH AVANTI 5FR X 11CM (SHEATH) ×3 IMPLANT
SHEATH AVANTI 7FRX11 (SHEATH) ×3 IMPLANT
WIRE GUIDERIGHT .035X150 (WIRE) ×3 IMPLANT

## 2017-09-29 NOTE — Progress Notes (Signed)
*  PRELIMINARY RESULTS* Echocardiogram Echocardiogram Transesophageal has been performed.  Sherrie Sport 09/29/2017, 9:39 AM

## 2017-09-30 ENCOUNTER — Encounter: Payer: 59 | Admitting: Nurse Practitioner

## 2017-09-30 ENCOUNTER — Encounter: Payer: Self-pay | Admitting: Internal Medicine

## 2017-09-30 LAB — GLUCOSE, CAPILLARY: GLUCOSE-CAPILLARY: 268 mg/dL — AB (ref 70–99)

## 2017-10-06 DIAGNOSIS — G4733 Obstructive sleep apnea (adult) (pediatric): Secondary | ICD-10-CM | POA: Diagnosis not present

## 2017-10-06 DIAGNOSIS — R0902 Hypoxemia: Secondary | ICD-10-CM | POA: Diagnosis not present

## 2017-10-06 DIAGNOSIS — R0609 Other forms of dyspnea: Secondary | ICD-10-CM | POA: Diagnosis not present

## 2017-10-06 DIAGNOSIS — I270 Primary pulmonary hypertension: Secondary | ICD-10-CM | POA: Diagnosis not present

## 2017-10-06 DIAGNOSIS — J449 Chronic obstructive pulmonary disease, unspecified: Secondary | ICD-10-CM | POA: Diagnosis not present

## 2017-10-13 ENCOUNTER — Encounter: Payer: Self-pay | Admitting: Nurse Practitioner

## 2017-10-13 ENCOUNTER — Other Ambulatory Visit: Payer: Self-pay

## 2017-10-13 ENCOUNTER — Ambulatory Visit: Payer: 59 | Attending: Nurse Practitioner | Admitting: Nurse Practitioner

## 2017-10-13 VITALS — BP 102/63 | HR 97 | Resp 16 | Ht 63.5 in | Wt 196.0 lb

## 2017-10-13 DIAGNOSIS — M47816 Spondylosis without myelopathy or radiculopathy, lumbar region: Secondary | ICD-10-CM

## 2017-10-13 DIAGNOSIS — G894 Chronic pain syndrome: Secondary | ICD-10-CM

## 2017-10-13 DIAGNOSIS — M79605 Pain in left leg: Secondary | ICD-10-CM

## 2017-10-13 DIAGNOSIS — M79604 Pain in right leg: Secondary | ICD-10-CM

## 2017-10-13 DIAGNOSIS — G8929 Other chronic pain: Secondary | ICD-10-CM

## 2017-10-13 DIAGNOSIS — Z79891 Long term (current) use of opiate analgesic: Secondary | ICD-10-CM

## 2017-10-13 DIAGNOSIS — M533 Sacrococcygeal disorders, not elsewhere classified: Secondary | ICD-10-CM

## 2017-10-13 MED ORDER — ONDANSETRON 4 MG PO TBDP: ORAL_TABLET | ORAL | 0 refills | Status: DC

## 2017-10-13 MED ORDER — VITAMIN B-12 1000 MCG PO TABS: 1000.0000 ug | ORAL_TABLET | Freq: Every day | ORAL | 1 refills | Status: DC

## 2017-10-13 MED ORDER — OXYCODONE HCL 5 MG PO TABS: 2.5000 mg | ORAL_TABLET | ORAL | 0 refills | Status: DC | PRN

## 2017-10-13 NOTE — Progress Notes (Signed)
Patient's Name: Stephanie Short  MRN: [id number]  Referring Provider: Kirk Ruths, MD  DOB: [birth year]  PCP: Kirk Ruths, MD  DOS: 10/13/2017  Note by: Vevelyn Francois NP  Service setting: Ambulatory outpatient  Specialty: Interventional Pain Management  Location: [facility] (AMB) Pain Management Facility    Patient type: Established    Primary Reason(s) for Visit: Encounter for prescription drug management. (Level of risk: moderate)  CC: No chief complaint on file.  HPI  Stephanie Short is a 67 y.o. year old, female patient, who comes today for a medication management evaluation. She has HYPERLIPIDEMIA-MIXED; AORTIC ATHEROSCLEROSIS; Endometrial polyp; Chronic low back pain (Location of Primary Source of Pain) (Bilateral) (R>L); Lumbar spondylosis; Failed back surgical syndrome; Lumbar facet syndrome (Bilateral) (R>L); Long term current use of opiate analgesic; Long term prescription opiate use; Opiate use (22.5 MME/Day); Encounter for therapeutic drug level monitoring; Encounter for chronic pain management; Airway hyperreactivity; Atherosclerosis of abdominal aorta ([place]); Type 2 diabetes mellitus with hyperglycemia, without long-term current use of insulin ([place]); Benign essential hypertension; Cannot sleep; Major depression in remission ([place]); Burning or prickling sensation; Hemorrhage, postmenopausal; Pure hypercholesterolemia; Osteopenia; Chronic hip pain(2) (Bilateral) (L>R); Greater trochanteric bursitis of hips (Bilateral); Chronic sacroiliac joint pain (Bilateral); Osteoarthritis of hips (Bilateral); Pain management; Chronic pain of lower extremity  (Bilateral) (L>R); Postmenopausal osteoporosis; Chronic cervical radicular pain; Neurogenic pain; Chronic kidney disease; Vitamin B12 deficiency; Problem with medical care compliance; Poor historian; Asthma; Chronic neck pain (3) (B) (R>L); Monoclonal gammopathy; Chronic pain syndrome; Chronic left shoulder pain; DDD (degenerative disc  disease), cervical; Healthcare maintenance; COPD (chronic obstructive pulmonary disease) ([place]); Acute diastolic CHF (congestive heart failure) ([place]); Chronic diastolic heart failure ([place]); Hyperlipidemia associated with type 2 diabetes mellitus ([place]); and Hypertension associated with chronic kidney disease due to type 2 diabetes mellitus ([place]) on their problem list. Her primarily concern today is the No chief complaint on file.  Pain Assessment: Location: Lower Back Radiating: denies Onset: More than a month ago Duration:   Quality: Aching, Constant Severity: 3 /10 (subjective, self-reported pain score)  Note: Reported level is compatible with observation.                          Effect on ADL: prolonged walking, prolonged standing Timing: Constant Modifying factors: medication, bending over will take pressure off BP: 102/63  HR: 97  Stephanie Short was last scheduled for an appointment on 07/30/2017 for medication management. During today's appointment we reviewed Stephanie Short's chronic pain status, as well as her outpatient medication regimen. She admits that she was in the hospital in [place]. She is that she had a cardiac work-up.  She will follow-up with pulmonologist on Friday.  She is very concerned that there is something more than COPD.  She admits that she had a brother that was diagnosed with sarcoidosis.  The patient  reports that she does not use drugs. Her body mass index is 34.18 kg/m.  Further details on both, my assessment(s), as well as the proposed treatment plan, please see below.  Controlled Substance Pharmacotherapy Assessment REMS (Risk Evaluation and Mitigation Strategy)  Analgesic:Oxycodone 2.5 mg 3 times daily (oxycodone 7.5 mg per day) MME/day:11.48m/day.  Stephanie Specking RN  10/13/2017  2:22 PM  Sign at close encounter Nursing Pain Medication Assessment:  Safety precautions to be maintained throughout the outpatient stay will include: orient to surroundings,  keep bed in low position, maintain call bell within reach at all times, provide  assistance with transfer out of bed and ambulation.  Medication Inspection Compliance: Pill count conducted under aseptic conditions, in front of the patient. Neither the pills nor the bottle was removed from the patient's sight at any time. Once count was completed pills were immediately returned to the patient in their original bottle.  Medication: Oxycodone 62m Pill/Patch Count: 7.5 of 90 pills remain Pill/Patch Appearance: Markings consistent with prescribed medication Bottle Appearance: Standard pharmacy container. Clearly labeled. Filled Date: 8 / 23 / 2019 Last Medication intake:  Today   Pharmacokinetics: Liberation and absorption (onset of action): WNL Distribution (time to peak effect): WNL Metabolism and excretion (duration of action): WNL         Pharmacodynamics: Desired effects: Analgesia: [name] >50% benefit. Functional ability: Patient reports that medication allows her to accomplish basic ADLs Clinically meaningful improvement in function (CMIF): Sustained CMIF goals met Perceived effectiveness: Described as relatively effective, allowing for increase in activities of daily living (ADL) Undesirable effects: Side-effects or Adverse reactions: None reported Monitoring: [place] PMP: Online review of the past 170-montheriod conducted. Compliant with practice rules and regulations Last UDS on record: Summary  Date Value Ref Range Status  03/31/2017 FINAL  Final    Comment:    ==================================================================== TOXASSURE SELECT 13 (MW) ==================================================================== Test                             Result       Flag       Units Drug Present and Declared for Prescription Verification   Oxycodone                      >5650        EXPECTED   ng/mg creat    Sources of oxycodone include scheduled prescription  medications. Drug Absent but Declared for Prescription Verification   Alprazolam                     Not Detected UNEXPECTED ng/mg creat   Oxymorphone                    Not Detected UNEXPECTED ng/mg creat   Noroxycodone                   Not Detected UNEXPECTED ng/mg creat   Noroxymorphone                 Not Detected UNEXPECTED ng/mg creat    A moderate to large amount of oxycodone is present; the    metabolites oxymorphone, noroxycodone and noroxymorphone are not    present. This is an atypical result. Although patients with    unusual metabolic profiles exist, they are rare. Review of    previous drug screen results or collection of a urine sample    several hours after a WITNESSED dose of the drug may help to    clarify the subject's ability to produce metabolite. ==================================================================== Test                      Result    Flag   Units      Ref Range   Creatinine              177              mg/dL      >=20 ==================================================================== Declared Medications:  The flagging and interpretation  on this report are based on the  following declared medications.  Unexpected results may arise from  inaccuracies in the declared medications.  **Note: The testing scope of this panel includes these medications:  Alprazolam  Oxycodone  **Note: The testing scope of this panel does not include following  reported medications:  Acetaminophen  Albuterol  Aspirin  Cyanocobalamin  Glimepiride  Ondansetron  Ranitidine  Valsartan ==================================================================== For clinical consultation, please call [phone]. ====================================================================    UDS interpretation: Non-Compliant         Voided prior to apt Medication Assessment Form: Discrepancies found between patient's report and information collected Treatment compliance:  Compliant Risk Assessment Profile: Aberrant behavior:See prior evaluations. None observed or detected today Comorbid factors increasing risk of overdose: COPD or asthma and sleep apnea Opioid risk tool (ORT) (Total Score): 0 Personal History of Substance Abuse (SUD-Substance use disorder):  Alcohol: Negative  Illegal Drugs: Negative  Rx Drugs: Negative  ORT Risk Level calculation: Low Risk Risk of substance use disorder (SUD): Moderate-to-High Opioid Risk Tool - 10/13/17 1411      Family History of Substance Abuse   Alcohol  Negative    Illegal Drugs  Negative    Rx Drugs  Negative      Personal History of Substance Abuse   Alcohol  Negative    Illegal Drugs  Negative    Rx Drugs  Negative      Age   Age between 74-45 years   No      History of Preadolescent Sexual Abuse   History of Preadolescent Sexual Abuse  Negative or Female      Psychological Disease   Psychological Disease  Negative    Depression  Negative      Total Score   Opioid Risk Tool Scoring  0    Opioid Risk Interpretation  Low Risk      ORT Scoring interpretation table:  Score <3 = Low Risk for SUD  Score between 4-7 = Moderate Risk for SUD  Score >8 = High Risk for Opioid Abuse   Risk Mitigation Strategies:  Patient Counseling: Covered Patient-Prescriber Agreement (PPA): Present and active  Notification to other healthcare providers: Done  Pharmacologic Plan: No change in therapy, at this time.             Laboratory Chemistry  Inflammation Markers (CRP: Acute Phase) (ESR: Chronic Phase) Lab Results  Component Value Date   CRP 0.6 04/25/2015   ESRSEDRATE 7 04/25/2015                         Rheumatology Markers Lab Results  Component Value Date   ANA Negative 08/14/2017                        Renal Function Markers Lab Results  Component Value Date   BUN 47 (H) 08/17/2017   CREATININE 1.12 (H) 08/17/2017   GFRAA 58 (L) 08/17/2017   GFRNONAA 50 (L) 08/17/2017                              Hepatic Function Markers Lab Results  Component Value Date   AST 20 08/13/2017   ALT 13 08/13/2017   ALBUMIN 3.9 08/13/2017   ALKPHOS 106 08/13/2017   LIPASE 24 12/01/2014  Electrolytes Lab Results  Component Value Date   NA 141 08/17/2017   K 4.1 08/17/2017   CL 100 08/17/2017   CALCIUM 9.3 08/17/2017   MG 1.9 04/25/2015                        Neuropathy Markers Lab Results  Component Value Date   VITAMINB12 178 (L) 04/25/2015   HGBA1C 7.7 (H) 08/19/2017   HIV Non Reactive 08/13/2017                        CNS Tests No results found for: COLORCSF, APPEARCSF, RBCCOUNTCSF, WBCCSF, POLYSCSF, LYMPHSCSF, EOSCSF, PROTEINCSF, GLUCCSF, JCVIRUS, CSFOLI, IGGCSF                      Bone Pathology Markers Lab Results  Component Value Date   IR518AC1YSA 55 04/25/2015   [id number] 55 04/25/2015   [id number] <10 04/25/2015                         Coagulation Parameters Lab Results  Component Value Date   INR 1.0 09/16/2012   LABPROT 13.3 09/16/2012   PLT 203 08/16/2017   DDIMER  02/06/2009    0.30        AT THE INHOUSE ESTABLISHED CUTOFF VALUE OF 0.48 ug/mL FEU, THIS ASSAY HAS BEEN DOCUMENTED IN THE LITERATURE TO HAVE A SENSITIVITY AND NEGATIVE PREDICTIVE VALUE OF AT LEAST 98 TO 99%.  THE TEST RESULT SHOULD BE CORRELATED WITH AN ASSESSMENT OF THE CLINICAL PROBABILITY OF DVT / VTE.                        Cardiovascular Markers Lab Results  Component Value Date   BNP 61.0 08/17/2017   CKTOTAL 56 01/10/2013   CKMB < 0.5 (L) 01/10/2013   TROPONINI <0.03 08/14/2017   HGB 14.0 08/16/2017   HCT 41.4 08/16/2017                         CA Markers No results found for: CEA, CA125, LABCA2                      Note: Lab results reviewed.  Recent Diagnostic Imaging Results  ECHO TEE                 *[place], [place] [address]                             [phone]  ------------------------------------------------------------------- Transesophageal Echocardiography  Patient:    Marguerite, Barba MR #:       [id number] Study Date: 09/29/2017 Gender:     F Age:        18 Height: Weight: BSA: Pt. Status: Room:   ATTENDING    Bartholome Bill, MD  ORDERING     Bartholome Bill, MD  REFERRING    Bartholome Bill, MD  SONOGRAPHER  Sherrie Sport RDCS  PERFORMING   [facility]  cc:  ------------------------------------------------------------------- LV EF: 55% -  60%  ------------------------------------------------------------------- Study Conclusions  - Left ventricle: Systolic function was normal. The estimated   ejection fraction was in the range of 55% to 60%. - Mitral valve: No evidence of vegetation. - Left atrium: No evidence of thrombus in the appendage. - Right atrium: No evidence of thrombus in the atrial cavity or   appendage. - Atrial septum: No defect or patent foramen ovale was identified.   Echo contrast study showed no right-to-left atrial level shunt,   at baseline or with provocation. - Tricuspid valve: No evidence of vegetation.  Impressions:  - Normal study.  ------------------------------------------------------------------- Study data:   Procedure:  Initial setup. The patient was brought to the laboratory. Surface ECG leads were monitored. Sedation. Conscious sedation was administered. Transesophageal echocardiography. Topical anesthesia was obtained using viscous lidocaine. A transesophageal probe was inserted by the attending cardiologist. Image quality was adequate.  Study completion:  The patient tolerated the procedure well. There were no complications.         Diagnostic transesophageal echocardiography.  2D and color Doppler.  Birthdate:  Patient birthdate: [birth year].  Age:  Patient is 67 yr old.  Sex:  Gender: female.  Study date:  Study date: 09/29/2017. Study time: 08:38  AM.  -------------------------------------------------------------------  ------------------------------------------------------------------- Left ventricle:  Systolic function was normal. The estimated ejection fraction was in the range of 55% to 60%.  ------------------------------------------------------------------- Aortic valve:   Trileaflet.  Doppler:  There was no regurgitation.   ------------------------------------------------------------------- Aorta:  The aorta was normal, not dilated, and non-diseased.  ------------------------------------------------------------------- Mitral valve:   Structurally normal valve.   Leaflet separation was normal.  No evidence of vegetation.  Doppler:  There was trivial regurgitation.  ------------------------------------------------------------------- Left atrium:  The atrium was normal in size.  No evidence of thrombus in the appendage. The appendage was of normal size. Emptying velocity was normal.  ------------------------------------------------------------------- Atrial septum:  No defect or patent foramen ovale was identified. Echo contrast study showed no right-to-left atrial level shunt, at baseline or with provocation.  ------------------------------------------------------------------- Right ventricle:  The cavity size was normal. Wall thickness was normal. Systolic function was normal.  ------------------------------------------------------------------- Tricuspid valve:   Structurally normal valve.   Leaflet separation was normal.  No evidence of vegetation.  Doppler:  There was trivial regurgitation.  ------------------------------------------------------------------- Right atrium:  The atrium was normal in size.  No evidence of thrombus in the atrial cavity or appendage.   ------------------------------------------------------------------- Post procedure conclusions Ascending Aorta:  - The aorta was normal, not  dilated, and non-diseased.  ------------------------------------------------------------------- Prepared and Electronically Authenticated by  Bartholome Bill, MD 2019-09-11T12:52:45  Complexity Note: Imaging results reviewed. Results shared with Ms. Igarashi, using Layman's terms.                         Meds   Current Outpatient Medications:  .  acetaminophen (TYLENOL) 325 MG tablet, Take 2 tablets (650 mg total) by mouth every 6 (six) hours as needed for mild pain or headache. (Patient taking differently: Take 500 mg by mouth every 6 (six) hours as needed for mild pain or headache. ), Disp: 30 tablet, Rfl: 0 .  albuterol (PROVENTIL HFA;VENTOLIN HFA) 108 (90 Base) MCG/ACT inhaler, Inhale 2 puffs into the lungs every 6 (six) hours as needed for wheezing or shortness of breath., Disp: , Rfl:  .  aspirin EC 81 MG tablet, Take 81 mg by mouth at bedtime., Disp: , Rfl:  .  Blood Glucose Monitoring Suppl (FIFTY50 GLUCOSE METER 2.0) w/Device  KIT, Use as directed. Dx E11.22 ONE TOUCH, Disp: , Rfl:  .  carvedilol (COREG) 6.25 MG tablet, Take 6.25 mg by mouth 2 (two) times daily with a meal., Disp: , Rfl:  .  felodipine (PLENDIL) 5 MG 24 hr tablet, Take 1 tablet (5 mg total) by mouth every morning. (Patient taking differently: Take 10 mg by mouth daily. ), Disp: 30 tablet, Rfl: 2 .  furosemide (LASIX) 40 MG tablet, Take 1 tablet (40 mg total) by mouth daily., Disp: 30 tablet, Rfl: 2 .  glimepiride (AMARYL) 2 MG tablet, Take 2 mg by mouth daily with breakfast., Disp: , Rfl:  .  ipratropium-albuterol (DUONEB) 0.5-2.5 (3) MG/3ML SOLN, Take 3 mLs by nebulization every 6 (six) hours as needed (wheezing, shortness of breath). (Patient taking differently: Take 3 mLs by nebulization 3 (three) times daily. ), Disp: 360 mL, Rfl: 1 .  ondansetron (ZOFRAN ODT) 4 MG disintegrating tablet, Allow 1-2 tablets to dissolve in your mouth every 8 hours as needed for nausea/vomiting, Disp: 30 tablet, Rfl: 0 .  ONE TOUCH ULTRA  TEST test strip, USE TO CHECK BLOOD SUGAR 1-2 TIMES DAILY AS DIRECTED. DX E11.22., Disp: , Rfl: 5 .  potassium chloride 20 MEQ TBCR, Take 20 mEq by mouth daily. While taking lasix, Disp: 30 tablet, Rfl: 2 .  sitaGLIPtin (JANUVIA) 100 MG tablet, Take 100 mg by mouth daily., Disp: , Rfl:  .  umeclidinium-vilanterol (ANORO ELLIPTA) 62.5-25 MCG/INH AEPB, Inhale 1 puff into the lungs daily. , Disp: , Rfl:  .  vitamin B-12 (CYANOCOBALAMIN) 1000 MCG tablet, Take 1 tablet (1,000 mcg total) by mouth daily., Disp: 30 tablet, Rfl: 1 .  [START ON 11/12/2017] oxyCODONE (OXY IR/ROXICODONE) 5 MG immediate release tablet, Take 0.5 tablets (2.5 mg total) by mouth every 4 (four) hours as needed for severe pain., Disp: 90 tablet, Rfl: 0 .  oxyCODONE (OXY IR/ROXICODONE) 5 MG immediate release tablet, Take 0.5 tablets (2.5 mg total) by mouth every 4 (four) hours as needed for severe pain., Disp: 90 tablet, Rfl: 0  ROS  Constitutional: Denies any fever or chills Gastrointestinal: No reported hemesis, hematochezia, vomiting, or acute GI distress Musculoskeletal: Denies any acute onset joint swelling, redness, loss of ROM, or weakness Neurological: No reported episodes of acute onset apraxia, aphasia, dysarthria, agnosia, amnesia, paralysis, loss of coordination, or loss of consciousness  Allergies  Ms. Cerezo is allergic to codeine; dilaudid [hydromorphone hcl]; tramadol; gabapentin; hydrocodone-acetaminophen; oxycodone-acetaminophen; promethazine; and scallops [shellfish allergy].  PFSH  Drug: Ms. Ellwood  reports that she does not use drugs. Alcohol:  reports that she drinks alcohol. Tobacco:  reports that she quit smoking about 13 years ago. She has a 20.00 pack-year smoking history. She has never used smokeless tobacco. Medical:  has a past medical history of Asthma, CHF (congestive heart failure) ([place]), Depression, Diabetes mellitus, Hepatitis C, Hypertension, Insomnia, and Oxygen dependent. Surgical: Ms. Milligan   has a past surgical history that includes Back surgery (2013); Hysteroscopy w/D&C (N/A, 08/18/2014); Knee arthroscopy (Right); Colonoscopy w/ polypectomy; Colonoscopy with propofol (N/A, 03/13/2015); and RIGHT/LEFT HEART CATH AND CORONARY ANGIOGRAPHY (N/A, 09/29/2017). Family: family history includes Colon cancer in her other; Coronary artery disease (age of onset: 17) in her brother; Coronary artery disease (age of onset: 75) in her mother; Diabetes in her father and other; Heart attack in her brother; Heart disease in her father; Hypertension in her other.  Constitutional Exam  General appearance: Well nourished, well developed, and well hydrated. In no apparent acute distress Vitals:  10/13/17 1359  BP: 102/63  Pulse: 97  Resp: 16  SpO2: 93%  Weight: 196 lb (88.9 kg)  Height: 5' 3.5" (1.613 m)  Psych/Mental status: Alert, oriented x 3 (person, place, & time)       Eyes: PERLA Respiratory: Oxygen-dependent COPD   Lumbar Spine Area Exam  Skin & Axial Inspection: No masses, redness, or swelling Alignment: Symmetrical Functional ROM: Unrestricted ROM       Stability: No instability detected Muscle Tone/Strength: Functionally intact. No obvious neuro-muscular anomalies detected. Sensory (Neurological): Unimpaired Palpation: No palpable anomalies         Gait & Posture Assessment  Ambulation: Patient ambulates using a wheel chair Gait: Relatively normal for age and body habitus Posture: WNL   Lower Extremity Exam    Side: Right lower extremity  Side: Left lower extremity  Stability: No instability observed          Stability: No instability observed          Skin & Extremity Inspection: Skin color, temperature, and hair growth are WNL. No peripheral edema or cyanosis. No masses, redness, swelling, asymmetry, or associated skin lesions. No contractures.  Skin & Extremity Inspection: Skin color, temperature, and hair growth are WNL. No peripheral edema or cyanosis. No masses, redness,  swelling, asymmetry, or associated skin lesions. No contractures.  Functional ROM: Unrestricted ROM                  Functional ROM: Unrestricted ROM                  Muscle Tone/Strength: Functionally intact. No obvious neuro-muscular anomalies detected.  Muscle Tone/Strength: Functionally intact. No obvious neuro-muscular anomalies detected.  Sensory (Neurological): Unimpaired  Sensory (Neurological): Unimpaired  Palpation: No palpable anomalies  Palpation: No palpable anomalies   Assessment  Primary Diagnosis & Pertinent Problem List: The primary encounter diagnosis was Lumbar spondylosis. Diagnoses of Chronic sacroiliac joint pain (Bilateral), Chronic pain of both lower extremities, Chronic pain syndrome, and Long term prescription opiate use were also pertinent to this visit.  Status Diagnosis  Persistent Controlled Controlled 1. Lumbar spondylosis   2. Chronic sacroiliac joint pain (Bilateral)   3. Chronic pain of both lower extremities   4. Chronic pain syndrome   5. Long term prescription opiate use     Problems updated and reviewed during this visit: Problem  Major Depression in Remission (Hcc)   Last Assessment & Plan:  Mood remains good with usual variations.  Last Assessment & Plan:  Mood has been doing relatively well.  Last Assessment & Plan:  Mood waxes and wanes but is doing fairly well at this point.   Last Assessment & Plan:  Mood is doing well on meds currently   Last Assessment & Plan:  Mood is doing well overall   Type 2 Diabetes Mellitus With Hyperglycemia, Without Long-Term Current Use of Insulin (Hcc)   Overview:  Intermittent 2 vs 3 per gfr with albuminuria  Last Assessment & Plan:  Continuing on diet as well as prescribed regimen without severe hypoglycemia being noted.  Nausea and itching are not symptomatic and nsaids are being avoided.   Overview:  Intermittent 2 vs 3 per gfr with albuminuria  Last Assessment & Plan:  Continuing on diet as  well as prescribed regimen with severe hypoglycemia. Intermittent 2 vs 3 per gfr with albuminuria  Intermittent 2 vs 3 per gfr with albuminuria  Last Assessment & Plan:  Diabetic diet is followed and no excessive highs  or lows are noted.  Nausea and itching are not symptomatic and nsaids are being avoided.  Intermittent 2 vs 3 per gfr with albuminuria  Last Assessment & Plan:  Initially quite hyperglycemic on prednisone and is now off   Asthma   Last Assessment & Plan:  Recent cough and congestion noted.   Last Assessment & Plan:  The patient's breathing has been doing well and no recent flairs are noted.  Last Assessment & Plan:  Post [facility], off steriods now, still hypoxic on her moniters. On O2 [place] still   Hyperlipidemia Associated With Type 2 Diabetes Mellitus (Hcc)  Hypertension Associated With Chronic Kidney Disease Due to Type 2 Diabetes Mellitus (Hcc)   Plan of Care  Pharmacotherapy (Medications Ordered): Meds ordered this encounter  Medications  . ondansetron (ZOFRAN ODT) 4 MG disintegrating tablet    Sig: Allow 1-2 tablets to dissolve in your mouth every 8 hours as needed for nausea/vomiting    Dispense:  30 tablet    Refill:  0    Order Specific Question:   Supervising Provider    AnswerMilinda Pointer [phone]  . vitamin B-12 (CYANOCOBALAMIN) 1000 MCG tablet    Sig: Take 1 tablet (1,000 mcg total) by mouth daily.    Dispense:  30 tablet    Refill:  1    Order Specific Question:   Supervising Provider    Answer:   Milinda Pointer [phone]  . oxyCODONE (OXY IR/ROXICODONE) 5 MG immediate release tablet    Sig: Take 0.5 tablets (2.5 mg total) by mouth every 4 (four) hours as needed for severe pain.    Dispense:  90 tablet    Refill:  0    Do not place this medication, or any other prescription from our practice, on "Automatic Refill". Patient may have prescription filled one day early if pharmacy is closed on scheduled refill date.    Order Specific  Question:   Supervising Provider    Answer:   Milinda Pointer [phone]  . oxyCODONE (OXY IR/ROXICODONE) 5 MG immediate release tablet    Sig: Take 0.5 tablets (2.5 mg total) by mouth every 4 (four) hours as needed for severe pain.    Dispense:  90 tablet    Refill:  0    Do not place this medication, or any other prescription from our practice, on "Automatic Refill". Patient may have prescription filled one day early if pharmacy is closed on scheduled refill date.    Order Specific Question:   Supervising Provider    Answer:   Milinda Pointer [[id number]]   New Prescriptions   No medications on file   Medications administered today: Vevelyn Pat had no medications administered during this visit. Lab-work, procedure(s), and/or referral(s): No orders of the defined types were placed in this encounter.  Imaging and/or referral(s): None  Interventional therapies: Planned, scheduled, and/or pending:   Not at this time.    Provider-requested follow-up: Return in about 2 months (around 12/13/2017) for MedMgmt with Me Donella Stade [facility]).  Future Appointments  Date Time Provider [place]  10/20/2017 11:00 AM [place] None   Primary Care Physician: Kirk Ruths, MD Location: [facility] Note by: Vevelyn Francois NP Date: 10/13/2017; Time: 2:50 PM  Pain Score Disclaimer: We use the NRS-11 scale. This is a self-reported, subjective measurement of pain severity with only modest accuracy. It is used primarily to identify changes within a particular patient. It must be understood  that outpatient pain scales are significantly less accurate that those used for research, where they can be applied under ideal controlled circumstances with minimal exposure to variables. In reality, the score is likely to be a combination of pain intensity and pain affect, where pain affect describes the degree of emotional arousal or  changes in action readiness caused by the sensory experience of pain. Factors such as social and work situation, setting, emotional state, anxiety levels, expectation, and prior pain experience may influence pain perception and show large inter-individual differences that may also be affected by time variables.  Patient instructions provided during this appointment: Patient Instructions   ____________________________________________________________________________________________  Medication Rules  Applies to: All patients receiving prescriptions (written or electronic).  Pharmacy of record: Pharmacy where electronic prescriptions will be sent. If written prescriptions are taken to a different pharmacy, please inform the nursing staff. The pharmacy listed in the electronic medical record should be the one where you would like electronic prescriptions to be sent.  Prescription refills: Only during scheduled appointments. Applies to both, written and electronic prescriptions.  NOTE: The following applies primarily to controlled substances (Opioid* Pain Medications).   Patient's responsibilities: 1. Pain Pills: Bring all pain pills to every appointment (except for procedure appointments). 2. Pill Bottles: Bring pills in original pharmacy bottle. Always bring newest bottle. Bring bottle, even if empty. 3. Medication refills: You are responsible for knowing and keeping track of what medications you need refilled. The day before your appointment, write a list of all prescriptions that need to be refilled. Bring that list to your appointment and give it to the admitting nurse. Prescriptions will be written only during appointments. If you forget a medication, it will not be "Called in", "Faxed", or "electronically sent". You will need to get another appointment to get these prescribed. 4. Prescription Accuracy: You are responsible for carefully inspecting your prescriptions before leaving our office.  Have the discharge nurse carefully go over each prescription with you, before taking them home. Make sure that your name is accurately spelled, that your address is correct. Check the name and dose of your medication to make sure it is accurate. Check the number of pills, and the written instructions to make sure they are clear and accurate. Make sure that you are given enough medication to last until your next medication refill appointment. 5. Taking Medication: Take medication as prescribed. Never take more pills than instructed. Never take medication more frequently than prescribed. Taking less pills or less frequently is permitted and encouraged, when it comes to controlled substances (written prescriptions).  6. Inform other Doctors: Always inform, all of your healthcare providers, of all the medications you take. 7. Pain Medication from other Providers: You are not allowed to accept any additional pain medication from any other Doctor or Healthcare provider. There are two exceptions to this rule. (see below) In the event that you require additional pain medication, you are responsible for notifying us, as stated below. 8. Medication Agreement: You are responsible for carefully reading and following our Medication Agreement. This must be signed before receiving any prescriptions from our practice. Safely store a copy of your signed Agreement. Violations to the Agreement will result in no further prescriptions. (Additional copies of our Medication Agreement are available upon request.) 9. Laws, Rules, & Regulations: All patients are expected to follow all Federal and [employer], Rules, [employer]. Ignorance of the Laws does not constitute a valid excuse. The use of any illegal substances is prohibited. 10.  Adopted CDC guidelines & recommendations: Target dosing levels will be at or below 60 MME/day. Use of benzodiazepines** is not recommended.  Exceptions: There are only two exceptions to  the rule of not receiving pain medications from other Healthcare Providers. 1. Exception #1 (Emergencies): In the event of an emergency (i.e.: accident requiring emergency care), you are allowed to receive additional pain medication. However, you are responsible for: As soon as you are able, call our office [phone], at any time of the day or night, and leave a message stating your name, the date and nature of the emergency, and the name and dose of the medication prescribed. In the event that your call is answered by a member of our staff, make sure to document and save the date, time, and the name of the person that took your information.  2. Exception #2 (Planned Surgery): In the event that you are scheduled by another doctor or dentist to have any type of surgery or procedure, you are allowed (for a period no longer than 30 days), to receive additional pain medication, for the acute post-op pain. However, in this case, you are responsible for picking up a copy of our "Post-op Pain Management for Surgeons" handout, and giving it to your surgeon or dentist. This document is available at our office, and does not require an appointment to obtain it. Simply go to our office during business hours (Monday-Thursday from 8:00 AM to 4:00 PM) (Friday 8:00 AM to 12:00 Noon) or if you have a scheduled appointment with [place], prior to your surgery, and ask for it by name. In addition, you will need to provide [place] with your name, name of your surgeon, type of surgery, and date of procedure or surgery.  *Opioid medications include: morphine, codeine, oxycodone, oxymorphone, hydrocodone, hydromorphone, meperidine, tramadol, tapentadol, buprenorphine, fentanyl, methadone. **Benzodiazepine medications include: diazepam (Valium), alprazolam (Xanax), clonazepam (Klonopine), lorazepam (Ativan), clorazepate (Tranxene), chlordiazepoxide (Librium), estazolam (Prosom), oxazepam (Serax), temazepam (Restoril), triazolam  (Halcion) (Last updated: 03/19/2017) ____________________________________________________________________________________________   ____________________________________________________________________________________________  Appointment Policy Summary  It is our goal and responsibility to provide the medical community with assistance in the evaluation and management of patients with chronic pain. Unfortunately our resources are limited. Because we do not have an unlimited amount of time, or available appointments, we are required to closely monitor and manage their use. The following rules exist to maximize their use:  Patient's responsibilities: 1. Punctuality:  At what time should I arrive? You should be physically present in our office 30 minutes before your scheduled appointment. Your scheduled appointment is with your assigned healthcare provider. However, it takes 5-10 minutes to be "checked-in", and another 15 minutes for the nurses to do the admission. If you arrive to our office at the time you were given for your appointment, you will end up being at least 20-25 minutes late to your appointment with the provider. 2. Tardiness:  What happens if I arrive only a few minutes after my scheduled appointment time? You will need to reschedule your appointment. The cutoff is your appointment time. This is why it is so important that you arrive at least 30 minutes before that appointment. If you have an appointment scheduled for 10:00 AM and you arrive at 10:01, you will be required to reschedule your appointment.  3. Plan ahead:  Always assume that you will encounter traffic on your way in. Plan for it. If you are dependent on a driver, make sure they understand these rules and the need to  arrive early. 4. Other appointments and responsibilities:  Avoid scheduling any other appointments before or after your pain clinic appointments.  5. Be prepared:  Write down everything that you need to  discuss with your healthcare provider and give this information to the admitting nurse. Write down the medications that you will need refilled. Bring your pills and bottles (even the empty ones), to all of your appointments, except for those where a procedure is scheduled. 6. No children or pets:  Find someone to take care of them. It is not appropriate to bring them in. 7. Scheduling changes:  We request "advanced notification" of any changes or cancellations. 8. Advanced notification:  Defined as a time period of more than 24 hours prior to the originally scheduled appointment. This allows for the appointment to be offered to other patients. 9. Rescheduling:  When a visit is rescheduled, it will require the cancellation of the original appointment. For this reason they both fall within the category of "Cancellations".  10. Cancellations:  They require advanced notification. Any cancellation less than 24 hours before the  appointment will be recorded as a "No Show". 11. No Show:  Defined as an unkept appointment where the patient failed to notify or declare to the practice their intention or inability to keep the appointment.  Corrective process for repeat offenders:  1. Tardiness: Three (3) episodes of rescheduling due to late arrivals will be recorded as one (1) "No Show". 2. Cancellation or reschedule: Three (3) cancellations or rescheduling will be recorded as one (1) "No Show". 3. "No Shows": Three (3) "No Shows" within a 12 month period will result in discharge from the practice. ____________________________________________________________________________________________   BMI Assessment: Estimated body mass index is 34.18 kg/m as calculated from the following:   Height as of this encounter: 5' 3.5" (1.613 m).   Weight as of this encounter: 196 lb (88.9 kg).  BMI interpretation table: BMI level Category Range association with higher incidence of chronic pain  <18 kg/m2 Underweight    18.5-24.9 kg/m2 Ideal body weight   25-29.9 kg/m2 Overweight Increased incidence by 20%  30-34.9 kg/m2 Obese (Class I) Increased incidence by 68%  35-39.9 kg/m2 Severe obesity (Class II) Increased incidence by 136%  >40 kg/m2 Extreme obesity (Class III) Increased incidence by 254%   Patient's current BMI Ideal Body weight  Body mass index is 34.18 kg/m. Ideal body weight: 53.5 kg (118 lb 0.9 oz) Adjusted ideal body weight: 67.7 kg (149 lb 3.7 oz)   BMI Readings from Last 4 Encounters:  10/13/17 34.18 kg/m  09/29/17 34.54 kg/m  08/26/17 33.75 kg/m  08/13/17 36.00 kg/m   Wt Readings from Last 4 Encounters:  10/13/17 196 lb (88.9 kg)  09/29/17 195 lb (88.5 kg)  08/26/17 190 lb 8 oz (86.4 kg)  08/13/17 203 lb 3.2 oz (92.2 kg)

## 2017-10-13 NOTE — Patient Instructions (Addendum)
____________________________________________________________________________________________  Medication Rules  Applies to: All patients receiving prescriptions (written or electronic).  Pharmacy of record: Pharmacy where electronic prescriptions will be sent. If written prescriptions are taken to a different pharmacy, please inform the nursing staff. The pharmacy listed in the electronic medical record should be the one where you would like electronic prescriptions to be sent.  Prescription refills: Only during scheduled appointments. Applies to both, written and electronic prescriptions.  NOTE: The following applies primarily to controlled substances (Opioid* Pain Medications).   Patient's responsibilities: 1. Pain Pills: Bring all pain pills to every appointment (except for procedure appointments). 2. Pill Bottles: Bring pills in original pharmacy bottle. Always bring newest bottle. Bring bottle, even if empty. 3. Medication refills: You are responsible for knowing and keeping track of what medications you need refilled. The day before your appointment, write a list of all prescriptions that need to be refilled. Bring that list to your appointment and give it to the admitting nurse. Prescriptions will be written only during appointments. If you forget a medication, it will not be "Called in", "Faxed", or "electronically sent". You will need to get another appointment to get these prescribed. 4. Prescription Accuracy: You are responsible for carefully inspecting your prescriptions before leaving our office. Have the discharge nurse carefully go over each prescription with you, before taking them home. Make sure that your name is accurately spelled, that your address is correct. Check the name and dose of your medication to make sure it is accurate. Check the number of pills, and the written instructions to make sure they are clear and accurate. Make sure that you are given enough medication to last  until your next medication refill appointment. 5. Taking Medication: Take medication as prescribed. Never take more pills than instructed. Never take medication more frequently than prescribed. Taking less pills or less frequently is permitted and encouraged, when it comes to controlled substances (written prescriptions).  6. Inform other Doctors: Always inform, all of your healthcare providers, of all the medications you take. 7. Pain Medication from other Providers: You are not allowed to accept any additional pain medication from any other Doctor or Healthcare provider. There are two exceptions to this rule. (see below) In the event that you require additional pain medication, you are responsible for notifying us, as stated below. 8. Medication Agreement: You are responsible for carefully reading and following our Medication Agreement. This must be signed before receiving any prescriptions from our practice. Safely store a copy of your signed Agreement. Violations to the Agreement will result in no further prescriptions. (Additional copies of our Medication Agreement are available upon request.) 9. Laws, Rules, & Regulations: All patients are expected to follow all Federal and State Laws, Statutes, Rules, & Regulations. Ignorance of the Laws does not constitute a valid excuse. The use of any illegal substances is prohibited. 10. Adopted CDC guidelines & recommendations: Target dosing levels will be at or below 60 MME/day. Use of benzodiazepines** is not recommended.  Exceptions: There are only two exceptions to the rule of not receiving pain medications from other Healthcare Providers. 1. Exception #1 (Emergencies): In the event of an emergency (i.e.: accident requiring emergency care), you are allowed to receive additional pain medication. However, you are responsible for: As soon as you are able, call our office [phone], at any time of the day or night, and leave a message stating your name, the  date and nature of the emergency, and the name and dose of the medication   prescribed. In the event that your call is answered by a member of our staff, make sure to document and save the date, time, and the name of the person that took your information.  2. Exception #2 (Planned Surgery): In the event that you are scheduled by another doctor or dentist to have any type of surgery or procedure, you are allowed (for a period no longer than 30 days), to receive additional pain medication, for the acute post-op pain. However, in this case, you are responsible for picking up a copy of our "Post-op Pain Management for Surgeons" handout, and giving it to your surgeon or dentist. This document is available at our office, and does not require an appointment to obtain it. Simply go to our office during business hours (Monday-Thursday from 8:00 AM to 4:00 PM) (Friday 8:00 AM to 12:00 Noon) or if you have a scheduled appointment with [place], prior to your surgery, and ask for it by name. In addition, you will need to provide [place] with your name, name of your surgeon, type of surgery, and date of procedure or surgery.  *Opioid medications include: morphine, codeine, oxycodone, oxymorphone, hydrocodone, hydromorphone, meperidine, tramadol, tapentadol, buprenorphine, fentanyl, methadone. **Benzodiazepine medications include: diazepam (Valium), alprazolam (Xanax), clonazepam (Klonopine), lorazepam (Ativan), clorazepate (Tranxene), chlordiazepoxide (Librium), estazolam (Prosom), oxazepam (Serax), temazepam (Restoril), triazolam (Halcion) (Last updated: 03/19/2017) ____________________________________________________________________________________________   ____________________________________________________________________________________________  Appointment Policy Summary  It is our goal and responsibility to provide the medical community with assistance in the evaluation and management of patients with chronic pain.  Unfortunately our resources are limited. Because we do not have an unlimited amount of time, or available appointments, we are required to closely monitor and manage their use. The following rules exist to maximize their use:  Patient's responsibilities: 1. Punctuality:  At what time should I arrive? You should be physically present in our office 30 minutes before your scheduled appointment. Your scheduled appointment is with your assigned healthcare provider. However, it takes 5-10 minutes to be "checked-in", and another 15 minutes for the nurses to do the admission. If you arrive to our office at the time you were given for your appointment, you will end up being at least 20-25 minutes late to your appointment with the provider. 2. Tardiness:  What happens if I arrive only a few minutes after my scheduled appointment time? You will need to reschedule your appointment. The cutoff is your appointment time. This is why it is so important that you arrive at least 30 minutes before that appointment. If you have an appointment scheduled for 10:00 AM and you arrive at 10:01, you will be required to reschedule your appointment.  3. Plan ahead:  Always assume that you will encounter traffic on your way in. Plan for it. If you are dependent on a driver, make sure they understand these rules and the need to arrive early. 4. Other appointments and responsibilities:  Avoid scheduling any other appointments before or after your pain clinic appointments.  5. Be prepared:  Write down everything that you need to discuss with your healthcare provider and give this information to the admitting nurse. Write down the medications that you will need refilled. Bring your pills and bottles (even the empty ones), to all of your appointments, except for those where a procedure is scheduled. 6. No children or pets:  Find someone to take care of them. It is not appropriate to bring them in. 7. Scheduling changes:  We request  "advanced notification" of any changes or  cancellations. 8. Advanced notification:  Defined as a time period of more than 24 hours prior to the originally scheduled appointment. This allows for the appointment to be offered to other patients. 9. Rescheduling:  When a visit is rescheduled, it will require the cancellation of the original appointment. For this reason they both fall within the category of "Cancellations".  10. Cancellations:  They require advanced notification. Any cancellation less than 24 hours before the  appointment will be recorded as a "No Show". 11. No Show:  Defined as an unkept appointment where the patient failed to notify or declare to the practice their intention or inability to keep the appointment.  Corrective process for repeat offenders:  1. Tardiness: Three (3) episodes of rescheduling due to late arrivals will be recorded as one (1) "No Show". 2. Cancellation or reschedule: Three (3) cancellations or rescheduling will be recorded as one (1) "No Show". 3. "No Shows": Three (3) "No Shows" within a 12 month period will result in discharge from the practice. ____________________________________________________________________________________________   BMI Assessment: Estimated body mass index is 34.18 kg/m as calculated from the following:   Height as of this encounter: 5' 3.5" (1.613 m).   Weight as of this encounter: 196 lb (88.9 kg).  BMI interpretation table: BMI level Category Range association with higher incidence of chronic pain  <18 kg/m2 Underweight   18.5-24.9 kg/m2 Ideal body weight   25-29.9 kg/m2 Overweight Increased incidence by 20%  30-34.9 kg/m2 Obese (Class I) Increased incidence by 68%  35-39.9 kg/m2 Severe obesity (Class II) Increased incidence by 136%  >40 kg/m2 Extreme obesity (Class III) Increased incidence by 254%   Patient's current BMI Ideal Body weight  Body mass index is 34.18 kg/m. Ideal body weight: 53.5 kg (118 lb 0.9  oz) Adjusted ideal body weight: 67.7 kg (149 lb 3.7 oz)   BMI Readings from Last 4 Encounters:  10/13/17 34.18 kg/m  09/29/17 34.54 kg/m  08/26/17 33.75 kg/m  08/13/17 36.00 kg/m   Wt Readings from Last 4 Encounters:  10/13/17 196 lb (88.9 kg)  09/29/17 195 lb (88.5 kg)  08/26/17 190 lb 8 oz (86.4 kg)  08/13/17 203 lb 3.2 oz (92.2 kg)

## 2017-10-13 NOTE — Progress Notes (Signed)
Nursing Pain Medication Assessment:  Safety precautions to be maintained throughout the outpatient stay will include: orient to surroundings, keep bed in low position, maintain call bell within reach at all times, provide assistance with transfer out of bed and ambulation.  Medication Inspection Compliance: Pill count conducted under aseptic conditions, in front of the patient. Neither the pills nor the bottle was removed from the patient's sight at any time. Once count was completed pills were immediately returned to the patient in their original bottle.  Medication: Oxycodone 82m Pill/Patch Count: 7.5 of 90 pills remain Pill/Patch Appearance: Markings consistent with prescribed medication Bottle Appearance: Standard pharmacy container. Clearly labeled. Filled Date: 8 / 23 / 2019 Last Medication intake:  Today

## 2017-10-16 DIAGNOSIS — I5031 Acute diastolic (congestive) heart failure: Secondary | ICD-10-CM | POA: Diagnosis not present

## 2017-10-16 DIAGNOSIS — R0609 Other forms of dyspnea: Secondary | ICD-10-CM | POA: Diagnosis not present

## 2017-10-16 DIAGNOSIS — J4520 Mild intermittent asthma, uncomplicated: Secondary | ICD-10-CM | POA: Diagnosis not present

## 2017-10-20 ENCOUNTER — Encounter: Payer: 59 | Attending: Specialist

## 2017-10-20 ENCOUNTER — Other Ambulatory Visit: Payer: Self-pay

## 2017-10-20 VITALS — Ht 64.0 in | Wt 198.7 lb

## 2017-10-20 DIAGNOSIS — G4700 Insomnia, unspecified: Secondary | ICD-10-CM | POA: Diagnosis not present

## 2017-10-20 DIAGNOSIS — J449 Chronic obstructive pulmonary disease, unspecified: Secondary | ICD-10-CM | POA: Insufficient documentation

## 2017-10-20 DIAGNOSIS — E119 Type 2 diabetes mellitus without complications: Secondary | ICD-10-CM | POA: Insufficient documentation

## 2017-10-20 DIAGNOSIS — I110 Hypertensive heart disease with heart failure: Secondary | ICD-10-CM | POA: Insufficient documentation

## 2017-10-20 DIAGNOSIS — Z87891 Personal history of nicotine dependence: Secondary | ICD-10-CM | POA: Insufficient documentation

## 2017-10-20 DIAGNOSIS — Z7984 Long term (current) use of oral hypoglycemic drugs: Secondary | ICD-10-CM | POA: Diagnosis not present

## 2017-10-20 DIAGNOSIS — I509 Heart failure, unspecified: Secondary | ICD-10-CM | POA: Insufficient documentation

## 2017-10-20 DIAGNOSIS — F329 Major depressive disorder, single episode, unspecified: Secondary | ICD-10-CM | POA: Diagnosis not present

## 2017-10-20 DIAGNOSIS — Z7982 Long term (current) use of aspirin: Secondary | ICD-10-CM | POA: Diagnosis not present

## 2017-10-20 DIAGNOSIS — Z9981 Dependence on supplemental oxygen: Secondary | ICD-10-CM | POA: Insufficient documentation

## 2017-10-20 DIAGNOSIS — Z79899 Other long term (current) drug therapy: Secondary | ICD-10-CM | POA: Diagnosis not present

## 2017-10-20 NOTE — Progress Notes (Signed)
Pulmonary Individual Treatment Plan  Patient Details  Name: Stephanie Short MRN: [id number] Date of Birth: 1950/04/07 Referring Provider:     Pulmonary Rehab from 10/20/2017 in [facility] Cardiac and Pulmonary Rehab  Referring Provider  [name]      Initial Encounter Date:    Pulmonary Rehab from 10/20/2017 in [facility] Cardiac and Pulmonary Rehab  Date  10/20/17      Visit Diagnosis: Chronic obstructive pulmonary disease, unspecified COPD type ([place])  Patient's Home Medications on Admission:  Current Outpatient Medications:  .  acetaminophen (TYLENOL) 325 MG tablet, Take 2 tablets (650 mg total) by mouth every 6 (six) hours as needed for mild pain or headache. (Patient taking differently: Take 500 mg by mouth every 6 (six) hours as needed for mild pain or headache. ), Disp: 30 tablet, Rfl: 0 .  albuterol (PROVENTIL HFA;VENTOLIN HFA) 108 (90 Base) MCG/ACT inhaler, Inhale 2 puffs into the lungs every 6 (six) hours as needed for wheezing or shortness of breath., Disp: , Rfl:  .  aspirin EC 81 MG tablet, Take 81 mg by mouth at bedtime., Disp: , Rfl:  .  Blood Glucose Monitoring Suppl (FIFTY50 GLUCOSE METER 2.0) w/Device KIT, Use as directed. Dx E11.22 ONE TOUCH, Disp: , Rfl:  .  carvedilol (COREG) 6.25 MG tablet, Take 6.25 mg by mouth 2 (two) times daily with a meal., Disp: , Rfl:  .  felodipine (PLENDIL) 5 MG 24 hr tablet, Take 1 tablet (5 mg total) by mouth every morning. (Patient taking differently: Take 10 mg by mouth daily. ), Disp: 30 tablet, Rfl: 2 .  furosemide (LASIX) 40 MG tablet, Take 1 tablet (40 mg total) by mouth daily., Disp: 30 tablet, Rfl: 2 .  glimepiride (AMARYL) 2 MG tablet, Take 2 mg by mouth daily with breakfast., Disp: , Rfl:  .  ipratropium-albuterol (DUONEB) 0.5-2.5 (3) MG/3ML SOLN, Take 3 mLs by nebulization every 6 (six) hours as needed (wheezing, shortness of breath). (Patient taking differently: Take 3 mLs by nebulization 3 (three) times daily. ), Disp: 360 mL, Rfl: 1 .   ondansetron (ZOFRAN ODT) 4 MG disintegrating tablet, Allow 1-2 tablets to dissolve in your mouth every 8 hours as needed for nausea/vomiting, Disp: 30 tablet, Rfl: 0 .  ONE TOUCH ULTRA TEST test strip, USE TO CHECK BLOOD SUGAR 1-2 TIMES DAILY AS DIRECTED. DX E11.22., Disp: , Rfl: 5 .  [START ON 11/12/2017] oxyCODONE (OXY IR/ROXICODONE) 5 MG immediate release tablet, Take 0.5 tablets (2.5 mg total) by mouth every 4 (four) hours as needed for severe pain., Disp: 90 tablet, Rfl: 0 .  oxyCODONE (OXY IR/ROXICODONE) 5 MG immediate release tablet, Take 0.5 tablets (2.5 mg total) by mouth every 4 (four) hours as needed for severe pain., Disp: 90 tablet, Rfl: 0 .  potassium chloride 20 MEQ TBCR, Take 20 mEq by mouth daily. While taking lasix, Disp: 30 tablet, Rfl: 2 .  sitaGLIPtin (JANUVIA) 100 MG tablet, Take 100 mg by mouth daily., Disp: , Rfl:  .  umeclidinium-vilanterol (ANORO ELLIPTA) 62.5-25 MCG/INH AEPB, Inhale 1 puff into the lungs daily. , Disp: , Rfl:  .  vitamin B-12 (CYANOCOBALAMIN) 1000 MCG tablet, Take 1 tablet (1,000 mcg total) by mouth daily., Disp: 30 tablet, Rfl: 1  Past Medical History: Past Medical History:  Diagnosis Date  . Asthma   . CHF (congestive heart failure) ([place])   . Depression   . Diabetes mellitus   . Hepatitis C    Dr. Staci Acosta pt took part in a study,  now cured  . Hypertension   . Insomnia   . Oxygen dependent     Tobacco Use: Social History   Tobacco Use  Smoking Status Former Smoker  . Packs/day: 1.00  . Years: 20.00  . Pack years: 20.00  . Types: Cigarettes  . Last attempt to quit: 01/21/2004  . Years since quitting: 13.7  Smokeless Tobacco Never Used    Labs: Recent Chemical engineer    Labs for ITP Cardiac and Pulmonary Rehab Latest Ref Rng & Units 09-21-202009 02/06/2009 02/07/2009 05/24/2012 08/19/2017   Cholestrol 0 - 200 mg/dL - - 104 ATP III CLASSIFICATION: <200     mg/dL   Desirable 200-239  mg/dL   Borderline High >=240    mg/dL   High 82 -    LDLCALC 0 - 100 mg/dL - - 48 Total Cholesterol/HDL:CHD Risk Coronary Heart Disease Risk Table Men   Women 1/2 Average Risk   3.4   3.3 Average Risk       5.0   4.4 2 X Average Risk   9.6   7.1 3 X Average Risk  23.4   11.0 Use the calculated Patient Ratio above and the CHD Risk Table to determine the patient's CHD Risk. ATP III CLASSIFICATION (LDL): <100     mg/dL   Optimal 100-129  mg/dL   Near or Above Optimal 130-159  mg/dL   Borderline 160-189  mg/dL   High >190     mg/dL   Very High 38 -   HDL 40 - 60 mg/dL - - 45 31(L) -   Trlycerides 0 - 200 mg/dL - - 56 67 -   Hemoglobin A1c 4.8 - 5.6 % - 7.4 (NOTE) The ADA recommends the following therapeutic goal for glycemic control related to Hgb A1c measurement: Goal of therapy: <6.5 Hgb A1c  Reference: American Diabetes Association: Clinical Practice Recommendations 2010, Diabetes Care, 2010, 33: (Suppl 1).(H) - - 7.7(H)   HCO3 - 30.6(H) - - - -   TCO2 - 32 - - - -       Pulmonary Assessment Scores: Pulmonary Assessment Scores    Row Name 10/20/17 1135         ADL UCSD   ADL Phase  Entry     SOB Score total  67     Rest  0     Walk  4     Stairs  5     Bath  4     Dress  5     Shop  3       CAT Score   CAT Score  19       mMRC Score   mMRC Score  3        Pulmonary Function Assessment: Pulmonary Function Assessment - 10/20/17 1134      Initial Spirometry Results   FVC%  48 %    FEV1%  55 %    FEV1/FVC Ratio  89.17    Comments  good patient effort      Post Bronchodilator Spirometry Results   FVC%  46.08 %    FEV1%  54.45 %    FEV1/FVC Ratio  92.09    Comments  good patient effort      Breath   Bilateral Breath Sounds  Clear    Shortness of Breath  Yes;Limiting activity;Panic with Shortness of Breath       Exercise Target Goals: Exercise Program Goal: Individual exercise prescription set using results from initial  6 min walk test and THRR while considering  patient's activity barriers and  safety.   Exercise Prescription Goal: Initial exercise prescription builds to 30-45 minutes a day of aerobic activity, 2-3 days per week.  Home exercise guidelines will be given to patient during program as part of exercise prescription that the participant will acknowledge.  Activity Barriers & Risk Stratification:   6 Minute Walk: 6 Minute Walk    Row Name 10/20/17 1239         6 Minute Walk   Phase  Initial     Distance  300 feet     Walk Time  2.75 minutes     # of Rest Breaks  0     MPH  1.23     METS  1.2     RPE  11     Perceived Dyspnea   3     VO2 Peak  4.2     Symptoms  No     Resting HR  92 bpm     Resting BP  114/64     Resting Oxygen Saturation   91 %     Exercise Oxygen Saturation  during 6 min walk  78 %     Max Ex. HR  120 bpm     Max Ex. BP  142/64     2 Minute Post BP  114/60       Interval HR   1 Minute HR  110     2 Minute HR  117     3 Minute HR  120     Interval Heart Rate?  Yes       Interval Oxygen   Interval Oxygen?  Yes     Baseline Oxygen Saturation %  91 %     1 Minute Oxygen Saturation %  84 %     1 Minute Liters of Oxygen  3 L pulsed     2 Minute Oxygen Saturation %  80 %     2 Minute Liters of Oxygen  3 L pulsed     3 Minute Oxygen Saturation %  78 %     3 Minute Liters of Oxygen  3 L pulsed       Oxygen Initial Assessment: Oxygen Initial Assessment - 10/20/17 1133      Home Oxygen   Home Oxygen Device  Home Concentrator;Portable Concentrator;E-Tanks    Sleep Oxygen Prescription  CPAP    Liters per minute  2    Home Exercise Oxygen Prescription  Continuous    Liters per minute  2    Home at Rest Exercise Oxygen Prescription  Continuous    Liters per minute  2    Compliance with Home Oxygen Use  Yes      Initial 6 min Walk   Oxygen Used  Pulsed;Portable Concentrator    Liters per minute  2      Program Oxygen Prescription   Program Oxygen Prescription  Continuous;E-Tanks    Liters per minute  2      Intervention    Short Term Goals  To learn and exhibit compliance with exercise, home and travel O2 prescription;To learn and understand importance of monitoring SPO2 with pulse oximeter and demonstrate accurate use of the pulse oximeter.;To learn and understand importance of maintaining oxygen saturations>88%;To learn and demonstrate proper pursed lip breathing techniques or other breathing techniques.;To learn and demonstrate proper use of respiratory medications    Long  Term Goals  Exhibits  compliance with exercise, home and travel O2 prescription;Verbalizes importance of monitoring SPO2 with pulse oximeter and return demonstration;Maintenance of O2 saturations>88%;Exhibits proper breathing techniques, such as pursed lip breathing or other method taught during program session;Compliance with respiratory medication;Demonstrates proper use of MDI's       Oxygen Re-Evaluation:   Oxygen Discharge (Final Oxygen Re-Evaluation):   Initial Exercise Prescription: Initial Exercise Prescription - 10/20/17 1200      Date of Initial Exercise RX and Referring Provider   Date  10/20/17    Referring Provider  [name]      Oxygen   Oxygen  Continuous    Liters  3      NuStep   Level  1    SPM  80    Minutes  15    METs  1.2      Biostep-RELP   Level  1    SPM  50    Minutes  15    METs  1      Track   Laps  10    Minutes  15    METs  1.3      Prescription Details   Frequency (times per week)  3    Duration  Progress to 45 minutes of aerobic exercise without signs/symptoms of physical distress      Intensity   THRR 40-80% of Max Heartrate  116-140    Ratings of Perceived Exertion  11-13    Perceived Dyspnea  0-4      Resistance Training   Training Prescription  Yes    Weight  2 lb    Reps  10-15       Perform Capillary Blood Glucose checks as needed.  Exercise Prescription Changes:   Exercise Comments:   Exercise Goals and Review: Exercise Goals    Row Name 10/20/17 1238              Exercise Goals   Increase Physical Activity  Yes       Intervention  Provide advice, education, support and counseling about physical activity/exercise needs.;Develop an individualized exercise prescription for aerobic and resistive training based on initial evaluation findings, risk stratification, comorbidities and participant's personal goals.       Expected Outcomes  Short Term: Attend rehab on a regular basis to increase amount of physical activity.;Long Term: Add in home exercise to make exercise part of routine and to increase amount of physical activity.;Long Term: Exercising regularly at least 3-5 days a week.       Increase Strength and Stamina  Yes       Intervention  Provide advice, education, support and counseling about physical activity/exercise needs.;Develop an individualized exercise prescription for aerobic and resistive training based on initial evaluation findings, risk stratification, comorbidities and participant's personal goals.       Expected Outcomes  Short Term: Increase workloads from initial exercise prescription for resistance, speed, and METs.;Short Term: Perform resistance training exercises routinely during rehab and add in resistance training at home;Long Term: Improve cardiorespiratory fitness, muscular endurance and strength as measured by increased METs and functional capacity (6MWT)       Able to understand and use rate of perceived exertion (RPE) scale  Yes       Intervention  Provide education and explanation on how to use RPE scale       Expected Outcomes  Short Term: Able to use RPE daily in rehab to express subjective intensity level;Long Term:  Able to use RPE to  guide intensity level when exercising independently       Able to understand and use Dyspnea scale  Yes       Intervention  Provide education and explanation on how to use Dyspnea scale       Expected Outcomes  Short Term: Able to use Dyspnea scale daily in rehab to express subjective sense  of shortness of breath during exertion;Long Term: Able to use Dyspnea scale to guide intensity level when exercising independently       Knowledge and understanding of Target Heart Rate Range (THRR)  Yes       Intervention  Provide education and explanation of THRR including how the numbers were predicted and where they are located for reference       Expected Outcomes  Short Term: Able to state/look up THRR;Short Term: Able to use daily as guideline for intensity in rehab;Long Term: Able to use THRR to govern intensity when exercising independently       Able to check pulse independently  Yes       Intervention  Provide education and demonstration on how to check pulse in carotid and radial arteries.;Review the importance of being able to check your own pulse for safety during independent exercise       Expected Outcomes  Short Term: Able to explain why pulse checking is important during independent exercise;Long Term: Able to check pulse independently and accurately       Understanding of Exercise Prescription  Yes       Intervention  Provide education, explanation, and written materials on patient's individual exercise prescription       Expected Outcomes  Short Term: Able to explain program exercise prescription;Long Term: Able to explain home exercise prescription to exercise independently          Exercise Goals Re-Evaluation :   Discharge Exercise Prescription (Final Exercise Prescription Changes):   Nutrition:  Target Goals: Understanding of nutrition guidelines, daily intake of sodium <1565m, cholesterol <2013m calories 30% from fat and 7% or less from saturated fats, daily to have 5 or more servings of fruits and vegetables.  Biometrics: Pre Biometrics - 10/20/17 1238      Pre Biometrics   Height  _0  (1.626 m)    Weight  198 lb 11.2 oz (90.1 kg)    Waist Circumference  40 inches    Hip Circumference  45 inches    Waist to Hip Ratio  0.89 %    BMI (Calculated)  34.09     Single Leg Stand  22.07 seconds        Nutrition Therapy Plan and Nutrition Goals: Nutrition Therapy & Goals - 10/20/17 1142      Personal Nutrition Goals   Nutrition Goal  She wants to lose weight    Personal Goal #2  Learn how to eat healthier    Comments  Her eating habits are all ovewr the place and needs guidance. Meet with the dietician.      Intervention Plan   Intervention  Prescribe, educate and counsel regarding individualized specific dietary modifications aiming towards targeted core components such as weight, hypertension, lipid management, diabetes, heart failure and other comorbidities.;Nutrition handout(s) given to patient.    Expected Outcomes  Short Term Goal: Understand basic principles of dietary content, such as calories, fat, sodium, cholesterol and nutrients.;Long Term Goal: Adherence to prescribed nutrition plan.       Nutrition Assessments: Nutrition Assessments - 10/20/17 1138      MEDFICTS Scores  Pre Score  69       Nutrition Goals Re-Evaluation:   Nutrition Goals Discharge (Final Nutrition Goals Re-Evaluation):   Psychosocial: Target Goals: Acknowledge presence or absence of significant depression and/or stress, maximize coping skills, provide positive support system. Participant is able to verbalize types and ability to use techniques and skills needed for reducing stress and depression.   Initial Review & Psychosocial Screening: Initial Psych Review & Screening - 10/20/17 1138      Initial Review   Current issues with  Current Stress Concerns;Current Depression    Source of Stress Concerns  Chronic Illness;Unable to perform yard/household activities    Comments  Her COPD and shortness of breath is what stresses her out the most. She cannot walk as far and do the things she use to do.       Family Dynamics   Good Support System?  Yes    Comments  She can look to her husband and gradnson for support.      Barriers   Psychosocial barriers  to participate in program  The patient should benefit from training in stress management and relaxation.      Screening Interventions   Interventions  Encouraged to exercise;Program counselor consult;To provide support and resources with identified psychosocial needs;Provide feedback about the scores to participant    Expected Outcomes  Short Term goal: Utilizing psychosocial counselor, staff and physician to assist with identification of specific Stressors or current issues interfering with healing process. Setting desired goal for each stressor or current issue identified.;Long Term Goal: Stressors or current issues are controlled or eliminated.;Short Term goal: Identification and review with participant of any Quality of Life or Depression concerns found by scoring the questionnaire.;Long Term goal: The participant improves quality of Life and PHQ9 Scores as seen by post scores and/or verbalization of changes       Quality of Life Scores:  Scores of 19 and below usually indicate a poorer quality of life in these areas.  A difference of  2-3 points is a clinically meaningful difference.  A difference of 2-3 points in the total score of the Quality of Life Index has been associated with significant improvement in overall quality of life, self-image, physical symptoms, and general health in studies assessing change in quality of life.  PHQ-9: Recent Review Flowsheet Data    Depression screen [facility] 2/9 10/20/2017 10/13/2017 07/30/2017 03/31/2017 01/29/2017   Decreased Interest 0 0 0 0 0   Down, Depressed, Hopeless 2 1 0 0 0   PHQ - 2 Score 2 1 0 0 0   Altered sleeping 1 - - - -   Tired, decreased energy 2 - - - -   Change in appetite 2 - - - -   Feeling bad or failure about yourself  3 - - - -   Trouble concentrating 0 - - - -   Moving slowly or fidgety/restless 3 - - - -   Suicidal thoughts 0 - - - -   PHQ-9 Score 13 - - - -   Difficult doing work/chores Extremely dIfficult - - - -      Interpretation of Total Score  Total Score Depression Severity:  1-4 = Minimal depression, 5-9 = Mild depression, 10-14 = Moderate depression, 15-19 = Moderately severe depression, 20-27 = Severe depression   Psychosocial Evaluation and Intervention:   Psychosocial Re-Evaluation:   Psychosocial Discharge (Final Psychosocial Re-Evaluation):   Education: Education Goals: Education classes will be provided on a weekly  basis, covering required topics. Participant will state understanding/return demonstration of topics presented.  Learning Barriers/Preferences: Learning Barriers/Preferences - 10/20/17 1138      Learning Barriers/Preferences   Learning Barriers  None    Learning Preferences  None       Education Topics:  Initial Evaluation Education: - Verbal, written and demonstration of respiratory meds, oximetry and breathing techniques. Instruction on use of nebulizers and MDIs and importance of monitoring MDI activations.   Pulmonary Rehab from 10/20/2017 in [facility] Cardiac and Pulmonary Rehab  Date  10/20/17  Educator  [facility]  Instruction Review Code  1- Verbalizes Understanding      General Nutrition Guidelines/Fats and Fiber: -Group instruction provided by verbal, written material, models and posters to present the general guidelines for heart healthy nutrition. Gives an explanation and review of dietary fats and fiber.   Controlling Sodium/Reading Food Labels: -Group verbal and written material supporting the discussion of sodium use in heart healthy nutrition. Review and explanation with models, verbal and written materials for utilization of the food label.   Exercise Physiology & General Exercise Guidelines: - Group verbal and written instruction with models to review the exercise physiology of the cardiovascular system and associated critical values. Provides general exercise guidelines with specific guidelines to those with heart or lung disease.    Aerobic  Exercise & Resistance Training: - Gives group verbal and written instruction on the various components of exercise. Focuses on aerobic and resistive training programs and the benefits of this training and how to safely progress through these programs.   Flexibility, Balance, Mind/Body Relaxation: Provides group verbal/written instruction on the benefits of flexibility and balance training, including mind/body exercise modes such as yoga, pilates and tai chi.  Demonstration and skill practice provided.   Stress and Anxiety: - Provides group verbal and written instruction about the health risks of elevated stress and causes of high stress.  Discuss the correlation between heart/lung disease and anxiety and treatment options. Review healthy ways to manage with stress and anxiety.   Depression: - Provides group verbal and written instruction on the correlation between heart/lung disease and depressed mood, treatment options, and the stigmas associated with seeking treatment.   Exercise & Equipment Safety: - Individual verbal instruction and demonstration of equipment use and safety with use of the equipment.   Pulmonary Rehab from 10/20/2017 in [facility] Cardiac and Pulmonary Rehab  Date  10/20/17  Educator  [facility]  Instruction Review Code  1- Verbalizes Understanding      Infection Prevention: - Provides verbal and written material to individual with discussion of infection control including proper hand washing and proper equipment cleaning during exercise session.   Pulmonary Rehab from 10/20/2017 in [facility] Cardiac and Pulmonary Rehab  Date  10/20/17  Educator  [facility]  Instruction Review Code  1- Verbalizes Understanding      Falls Prevention: - Provides verbal and written material to individual with discussion of falls prevention and safety.   Pulmonary Rehab from 10/20/2017 in [facility] Cardiac and Pulmonary Rehab  Date  10/20/17  Educator  [facility]  Instruction Review Code  1- Verbalizes Understanding       Diabetes: - Individual verbal and written instruction to review signs/symptoms of diabetes, desired ranges of glucose level fasting, after meals and with exercise. Advice that pre and post exercise glucose checks will be done for 3 sessions at entry of program.   Chronic Lung Diseases: - Group verbal and written instruction to review updates, respiratory medications, advancements in procedures and  treatments. Discuss use of supplemental oxygen including available portable oxygen systems, continuous and intermittent flow rates, concentrators, personal use and safety guidelines. Review proper use of inhaler and spacers. Provide informative websites for self-education.    Energy Conservation: - Provide group verbal and written instruction for methods to conserve energy, plan and organize activities. Instruct on pacing techniques, use of adaptive equipment and posture/positioning to relieve shortness of breath.   Triggers and Exacerbations: - Group verbal and written instruction to review types of environmental triggers and ways to prevent exacerbations. Discuss weather changes, air quality and the benefits of nasal washing. Review warning signs and symptoms to help prevent infections. Discuss techniques for effective airway clearance, coughing, and vibrations.   AED/CPR: - Group verbal and written instruction with the use of models to demonstrate the basic use of the AED with the basic ABC's of resuscitation.   Anatomy and Physiology of the Lungs: - Group verbal and written instruction with the use of models to provide basic lung anatomy and physiology related to function, structure and complications of lung disease.   Anatomy & Physiology of the Heart: - Group verbal and written instruction and models provide basic cardiac anatomy and physiology, with the coronary electrical and arterial systems. Review of Valvular disease and Heart Failure   Cardiac Medications: - Group verbal and  written instruction to review commonly prescribed medications for heart disease. Reviews the medication, class of the drug, and side effects.   Know Your Numbers and Risk Factors: -Group verbal and written instruction about important numbers in your health.  Discussion of what are risk factors and how they play a role in the disease process.  Review of Cholesterol, Blood Pressure, Diabetes, and BMI and the role they play in your overall health.   Sleep Hygiene: -Provides group verbal and written instruction about how sleep can affect your health.  Define sleep hygiene, discuss sleep cycles and impact of sleep habits. Review good sleep hygiene tips.    Other: -Provides group and verbal instruction on various topics (see comments)    Knowledge Questionnaire Score: Knowledge Questionnaire Score - 10/20/17 1138      Knowledge Questionnaire Score   Pre Score  14/18   reviewed with patient        Core Components/Risk Factors/Patient Goals at Admission: Personal Goals and Risk Factors at Admission - 10/20/17 1144      Core Components/Risk Factors/Patient Goals on Admission    Weight Management  Yes;Weight Loss    Intervention  Weight Management: Develop a combined nutrition and exercise program designed to reach desired caloric intake, while maintaining appropriate intake of nutrient and fiber, sodium and fats, and appropriate energy expenditure required for the weight goal.;Weight Management: Provide education and appropriate resources to help participant work on and attain dietary goals.;Weight Management/Obesity: Establish reasonable short term and long term weight goals.    Admit Weight  198 lb 11.2 oz (90.1 kg)    Goal Weight: Short Term  193 lb (87.5 kg)    Goal Weight: Long Term  150 lb (68 kg)    Expected Outcomes  Short Term: Continue to assess and modify interventions until short term weight is achieved;Long Term: Adherence to nutrition and physical activity/exercise program  aimed toward attainment of established weight goal;Weight Loss: Understanding of general recommendations for a balanced deficit meal plan, which promotes 1-2 lb weight loss per week and includes a negative energy balance of [phone] kcal/d;Understanding recommendations for meals to include 15-35% energy as protein, 25-35% energy  from fat, 35-60% energy from carbohydrates, less than 25m of dietary cholesterol, 20-35 gm of total fiber daily;Understanding of distribution of calorie intake throughout the day with the consumption of 4-5 meals/snacks    Improve shortness of breath with ADL's  Yes    Intervention  Provide education, individualized exercise plan and daily activity instruction to help decrease symptoms of SOB with activities of daily living.    Expected Outcomes  Short Term: Improve cardiorespiratory fitness to achieve a reduction of symptoms when performing ADLs;Long Term: Be able to perform more ADLs without symptoms or delay the onset of symptoms    Diabetes  Yes    Intervention  Provide education about proper nutrition, including hydration, and aerobic/resistive exercise prescription along with prescribed medications to achieve blood glucose in normal ranges: Fasting glucose 65-99 mg/dL;Provide education about signs/symptoms and action to take for hypo/hyperglycemia.    Expected Outcomes  Long Term: Attainment of HbA1C < 7%.;Short Term: Participant verbalizes understanding of the signs/symptoms and immediate care of hyper/hypoglycemia, proper foot care and importance of medication, aerobic/resistive exercise and nutrition plan for blood glucose control.    Hypertension  Yes    Intervention  Provide education on lifestyle modifcations including regular physical activity/exercise, weight management, moderate sodium restriction and increased consumption of fresh fruit, vegetables, and low fat dairy, alcohol moderation, and smoking cessation.;Monitor prescription use compliance.    Expected  Outcomes  Short Term: Continued assessment and intervention until BP is < 140/960mHG in hypertensive participants. < 130/8046mG in hypertensive participants with diabetes, heart failure or chronic kidney disease.;Long Term: Maintenance of blood pressure at goal levels.       Core Components/Risk Factors/Patient Goals Review:    Core Components/Risk Factors/Patient Goals at Discharge (Final Review):    ITP Comments: ITP Comments    Row Name 10/20/17 1108           ITP Comments  Medical Evaluation completed. Chart sent for review and changes to Dr. MarEmily Filbertrector of [facility]. Pauliagnosis can be found in [facility] encounter          Comments: Initial ITP

## 2017-10-20 NOTE — Patient Instructions (Signed)
Patient Instructions  Patient Details  Name: Stephanie Short MRN: [id number] Date of Birth: 04/29/1950 Referring Provider:  Erby Pian, MD  Below are your personal goals for exercise, nutrition, and risk factors. Our goal is to help you stay on track towards obtaining and maintaining these goals. We will be discussing your progress on these goals with you throughout the program.  Initial Exercise Prescription: Initial Exercise Prescription - 10/20/17 1200      Date of Initial Exercise RX and Referring Provider   Date  10/20/17    Referring Provider  [name]      Oxygen   Oxygen  Continuous    Liters  3      NuStep   Level  1    SPM  80    Minutes  15    METs  1.2      Biostep-RELP   Level  1    SPM  50    Minutes  15    METs  1      Track   Laps  10    Minutes  15    METs  1.3      Prescription Details   Frequency (times per week)  3    Duration  Progress to 45 minutes of aerobic exercise without signs/symptoms of physical distress      Intensity   THRR 40-80% of Max Heartrate  116-140    Ratings of Perceived Exertion  11-13    Perceived Dyspnea  0-4      Resistance Training   Training Prescription  Yes    Weight  2 lb    Reps  10-15       Exercise Goals: Frequency: Be able to perform aerobic exercise two to three times per week in program working toward 2-5 days per week of home exercise.  Intensity: Work with a perceived exertion of 11 (fairly light) - 15 (hard) while following your exercise prescription.  We will make changes to your prescription with you as you progress through the program.   Duration: Be able to do 30 to 45 minutes of continuous aerobic exercise in addition to a 5 minute warm-up and a 5 minute cool-down routine.   Nutrition Goals: Your personal nutrition goals will be established when you do your nutrition analysis with the dietician.  The following are general nutrition guidelines to follow: Cholesterol <  232m/day Sodium < 15038mday Fiber: Women over 50 yrs - 21 grams per day  Personal Goals: Personal Goals and Risk Factors at Admission - 10/20/17 1144      Core Components/Risk Factors/Patient Goals on Admission    Weight Management  Yes;Weight Loss    Intervention  Weight Management: Develop a combined nutrition and exercise program designed to reach desired caloric intake, while maintaining appropriate intake of nutrient and fiber, sodium and fats, and appropriate energy expenditure required for the weight goal.;Weight Management: Provide education and appropriate resources to help participant work on and attain dietary goals.;Weight Management/Obesity: Establish reasonable short term and long term weight goals.    Admit Weight  198 lb 11.2 oz (90.1 kg)    Goal Weight: Short Term  193 lb (87.5 kg)    Goal Weight: Long Term  150 lb (68 kg)    Expected Outcomes  Short Term: Continue to assess and modify interventions until short term weight is achieved;Long Term: Adherence to nutrition and physical activity/exercise program aimed toward attainment of established weight goal;Weight Loss: Understanding of general recommendations  for a balanced deficit meal plan, which promotes 1-2 lb weight loss per week and includes a negative energy balance of [phone] kcal/d;Understanding recommendations for meals to include 15-35% energy as protein, 25-35% energy from fat, 35-60% energy from carbohydrates, less than 231m of dietary cholesterol, 20-35 gm of total fiber daily;Understanding of distribution of calorie intake throughout the day with the consumption of 4-5 meals/snacks    Improve shortness of breath with ADL's  Yes    Intervention  Provide education, individualized exercise plan and daily activity instruction to help decrease symptoms of SOB with activities of daily living.    Expected Outcomes  Short Term: Improve cardiorespiratory fitness to achieve a reduction of symptoms when performing ADLs;Long  Term: Be able to perform more ADLs without symptoms or delay the onset of symptoms    Diabetes  Yes    Intervention  Provide education about proper nutrition, including hydration, and aerobic/resistive exercise prescription along with prescribed medications to achieve blood glucose in normal ranges: Fasting glucose 65-99 mg/dL;Provide education about signs/symptoms and action to take for hypo/hyperglycemia.    Expected Outcomes  Long Term: Attainment of HbA1C < 7%.;Short Term: Participant verbalizes understanding of the signs/symptoms and immediate care of hyper/hypoglycemia, proper foot care and importance of medication, aerobic/resistive exercise and nutrition plan for blood glucose control.    Hypertension  Yes    Intervention  Provide education on lifestyle modifcations including regular physical activity/exercise, weight management, moderate sodium restriction and increased consumption of fresh fruit, vegetables, and low fat dairy, alcohol moderation, and smoking cessation.;Monitor prescription use compliance.    Expected Outcomes  Short Term: Continued assessment and intervention until BP is < 140/980mHG in hypertensive participants. < 130/8024mG in hypertensive participants with diabetes, heart failure or chronic kidney disease.;Long Term: Maintenance of blood pressure at goal levels.       Tobacco Use Initial Evaluation: Social History   Tobacco Use  Smoking Status Former Smoker  . Packs/day: 1.00  . Years: 20.00  . Pack years: 20.00  . Types: Cigarettes  . Last attempt to quit: 01/21/2004  . Years since quitting: 13.7  Smokeless Tobacco Never Used    Exercise Goals and Review: Exercise Goals    Row Name 10/20/17 1238             Exercise Goals   Increase Physical Activity  Yes       Intervention  Provide advice, education, support and counseling about physical activity/exercise needs.;Develop an individualized exercise prescription for aerobic and resistive training based  on initial evaluation findings, risk stratification, comorbidities and participant's personal goals.       Expected Outcomes  Short Term: Attend rehab on a regular basis to increase amount of physical activity.;Long Term: Add in home exercise to make exercise part of routine and to increase amount of physical activity.;Long Term: Exercising regularly at least 3-5 days a week.       Increase Strength and Stamina  Yes       Intervention  Provide advice, education, support and counseling about physical activity/exercise needs.;Develop an individualized exercise prescription for aerobic and resistive training based on initial evaluation findings, risk stratification, comorbidities and participant's personal goals.       Expected Outcomes  Short Term: Increase workloads from initial exercise prescription for resistance, speed, and METs.;Short Term: Perform resistance training exercises routinely during rehab and add in resistance training at home;Long Term: Improve cardiorespiratory fitness, muscular endurance and strength as measured by increased METs and functional capacity (6MWT)  Able to understand and use rate of perceived exertion (RPE) scale  Yes       Intervention  Provide education and explanation on how to use RPE scale       Expected Outcomes  Short Term: Able to use RPE daily in rehab to express subjective intensity level;Long Term:  Able to use RPE to guide intensity level when exercising independently       Able to understand and use Dyspnea scale  Yes       Intervention  Provide education and explanation on how to use Dyspnea scale       Expected Outcomes  Short Term: Able to use Dyspnea scale daily in rehab to express subjective sense of shortness of breath during exertion;Long Term: Able to use Dyspnea scale to guide intensity level when exercising independently       Knowledge and understanding of Target Heart Rate Range (THRR)  Yes       Intervention  Provide education and explanation  of THRR including how the numbers were predicted and where they are located for reference       Expected Outcomes  Short Term: Able to state/look up THRR;Short Term: Able to use daily as guideline for intensity in rehab;Long Term: Able to use THRR to govern intensity when exercising independently       Able to check pulse independently  Yes       Intervention  Provide education and demonstration on how to check pulse in carotid and radial arteries.;Review the importance of being able to check your own pulse for safety during independent exercise       Expected Outcomes  Short Term: Able to explain why pulse checking is important during independent exercise;Long Term: Able to check pulse independently and accurately       Understanding of Exercise Prescription  Yes       Intervention  Provide education, explanation, and written materials on patient's individual exercise prescription       Expected Outcomes  Short Term: Able to explain program exercise prescription;Long Term: Able to explain home exercise prescription to exercise independently          Copy of goals given to participant.

## 2017-10-20 NOTE — Progress Notes (Signed)
Daily Session Note  Patient Details  Name: Stephanie Short MRN: [id number] Date of Birth: 05-May-1950 Referring Provider:     Pulmonary Rehab from 10/20/2017 in [facility] Cardiac and Pulmonary Rehab  Referring Provider  [name]      Encounter Date: 10/20/2017  Check In: Session Check In - 10/20/17 1109      Check-In   Supervising physician immediately available to respond to emergencies  [facility] immediately available ER MD    Physician(s)  Dr. Burlene Arnt and [facility]    Location  [facility]-Cardiac & Pulmonary Rehab    Staff Present  Justin Mend RCP,RRT,BSRT;Amanda Oletta Darter, [place], ACSM CEP, Exercise Physiologist    Medication changes reported      No    Fall or balance concerns reported     No    Warm-up and Cool-down  Not performed (comment)   Medical Evaluation   Resistance Training Performed  No    VAD Patient?  No    PAD/SET Patient?  No      Pain Assessment   Currently in Pain?  No/denies          Social History   Tobacco Use  Smoking Status Former Smoker  . Packs/day: 1.00  . Years: 20.00  . Pack years: 20.00  . Types: Cigarettes  . Last attempt to quit: 01/21/2004  . Years since quitting: 13.7  Smokeless Tobacco Never Used    Goals Met:  Exercise tolerated well Personal goals reviewed No report of cardiac concerns or symptoms Strength training completed today  Goals Unmet:  Not Applicable  Comments:  6 Minute Walk    Row Name 10/20/17 1239         6 Minute Walk   Phase  Initial     Distance  300 feet     Walk Time  2.75 minutes     # of Rest Breaks  0     MPH  1.23     METS  1.2     RPE  11     Perceived Dyspnea   3     VO2 Peak  4.2     Symptoms  No     Resting HR  92 bpm     Resting BP  114/64     Resting Oxygen Saturation   91 %     Exercise Oxygen Saturation  during 6 min walk  78 %     Max Ex. HR  120 bpm     Max Ex. BP  142/64     2 Minute Post BP  114/60       Interval HR   1 Minute HR  110     2 Minute HR  117     3 Minute HR  120      Interval Heart Rate?  Yes       Interval Oxygen   Interval Oxygen?  Yes     Baseline Oxygen Saturation %  91 %     1 Minute Oxygen Saturation %  84 %     1 Minute Liters of Oxygen  3 L pulsed     2 Minute Oxygen Saturation %  80 %     2 Minute Liters of Oxygen  3 L pulsed     3 Minute Oxygen Saturation %  78 %     3 Minute Liters of Oxygen  3 L pulsed       Service Time 1045-1200   Dr. Emily Filbert is Medical  Director for [place] and [facility].

## 2017-10-21 DIAGNOSIS — F325 Major depressive disorder, single episode, in full remission: Secondary | ICD-10-CM | POA: Diagnosis not present

## 2017-10-21 DIAGNOSIS — J4521 Mild intermittent asthma with (acute) exacerbation: Secondary | ICD-10-CM | POA: Diagnosis not present

## 2017-10-21 DIAGNOSIS — E1122 Type 2 diabetes mellitus with diabetic chronic kidney disease: Secondary | ICD-10-CM | POA: Diagnosis not present

## 2017-10-21 DIAGNOSIS — G4700 Insomnia, unspecified: Secondary | ICD-10-CM | POA: Diagnosis not present

## 2017-10-21 DIAGNOSIS — M5030 Other cervical disc degeneration, unspecified cervical region: Secondary | ICD-10-CM | POA: Diagnosis not present

## 2017-10-21 DIAGNOSIS — I272 Pulmonary hypertension, unspecified: Secondary | ICD-10-CM | POA: Diagnosis not present

## 2017-10-21 DIAGNOSIS — R0602 Shortness of breath: Secondary | ICD-10-CM | POA: Diagnosis not present

## 2017-10-21 DIAGNOSIS — R0902 Hypoxemia: Secondary | ICD-10-CM | POA: Diagnosis not present

## 2017-10-21 DIAGNOSIS — I10 Essential (primary) hypertension: Secondary | ICD-10-CM | POA: Diagnosis not present

## 2017-10-22 ENCOUNTER — Other Ambulatory Visit: Payer: Self-pay | Admitting: Internal Medicine

## 2017-10-22 DIAGNOSIS — I270 Primary pulmonary hypertension: Secondary | ICD-10-CM | POA: Diagnosis not present

## 2017-10-22 DIAGNOSIS — J449 Chronic obstructive pulmonary disease, unspecified: Secondary | ICD-10-CM | POA: Diagnosis not present

## 2017-10-22 DIAGNOSIS — R0902 Hypoxemia: Secondary | ICD-10-CM | POA: Diagnosis not present

## 2017-10-22 DIAGNOSIS — Z1231 Encounter for screening mammogram for malignant neoplasm of breast: Secondary | ICD-10-CM

## 2017-10-22 DIAGNOSIS — Z9981 Dependence on supplemental oxygen: Secondary | ICD-10-CM | POA: Diagnosis not present

## 2017-10-23 ENCOUNTER — Encounter: Payer: 59 | Admitting: *Deleted

## 2017-10-23 DIAGNOSIS — J449 Chronic obstructive pulmonary disease, unspecified: Secondary | ICD-10-CM | POA: Diagnosis not present

## 2017-10-23 LAB — GLUCOSE, CAPILLARY
GLUCOSE-CAPILLARY: 174 mg/dL — AB (ref 70–99)
Glucose-Capillary: 192 mg/dL — ABNORMAL HIGH (ref 70–99)

## 2017-10-23 NOTE — Progress Notes (Signed)
Daily Session Note  Patient Details  Name: Stephanie Short MRN: [id number] Date of Birth: 1950-10-14 Referring Provider:     Pulmonary Rehab from 10/20/2017 in [facility] Cardiac and Pulmonary Rehab  Referring Provider  [name]      Encounter Date: 10/23/2017  Check In: Session Check In - 10/23/17 1148      Check-In   Supervising physician immediately available to respond to emergencies  [facility] immediately available ER MD    Physician(s)  Dr. Reita Cliche and Alfred Levins    Location  [facility]-Cardiac & Pulmonary Rehab    Staff Present  Renita Papa, RN BSN;Jessica Luan Pulling, MA, RCEP, CCRP, Exercise Physiologist;Nana Addai, RN BSN    Medication changes reported      No    Fall or balance concerns reported     No    Warm-up and Cool-down  Performed as group-led instruction    Resistance Training Performed  Yes    VAD Patient?  No    PAD/SET Patient?  No      Pain Assessment   Currently in Pain?  No/denies          Social History   Tobacco Use  Smoking Status Former Smoker  . Packs/day: 1.00  . Years: 20.00  . Pack years: 20.00  . Types: Cigarettes  . Last attempt to quit: 01/21/2004  . Years since quitting: 13.7  Smokeless Tobacco Never Used    Goals Met:  Proper associated with RPD/PD & O2 Sat Independence with exercise equipment Using PLB without cueing & demonstrates good technique Exercise tolerated well No report of cardiac concerns or symptoms Strength training completed today  Goals Unmet:  Not Applicable  Comments: First full day of exercise!  Patient was oriented to gym and equipment including functions, settings, policies, and procedures.  Patient's individual exercise prescription and treatment plan were reviewed.  All starting workloads were established based on the results of the 6 minute walk test done at initial orientation visit.  The plan for exercise progression was also introduced and progression will be customized based on patient's performance and  goals.     Dr. Emily Filbert is Medical Director for [place] and [facility].

## 2017-10-26 DIAGNOSIS — J449 Chronic obstructive pulmonary disease, unspecified: Secondary | ICD-10-CM

## 2017-10-26 LAB — GLUCOSE, CAPILLARY
Glucose-Capillary: 171 mg/dL — ABNORMAL HIGH (ref 70–99)
Glucose-Capillary: 165 mg/dL — ABNORMAL HIGH (ref 70–99)

## 2017-10-26 NOTE — Progress Notes (Signed)
Daily Session Note  Patient Details  Name: Stephanie Short MRN: [id number] Date of Birth: 14-Mar-1950 Referring Provider:     Pulmonary Rehab from 10/20/2017 in [facility] Cardiac and Pulmonary Rehab  Referring Provider  [name]      Encounter Date: 10/26/2017  Check In:      Social History   Tobacco Use  Smoking Status Former Smoker  . Packs/day: 1.00  . Years: 20.00  . Pack years: 20.00  . Types: Cigarettes  . Last attempt to quit: 01/21/2004  . Years since quitting: 13.7  Smokeless Tobacco Never Used    Goals Met:  Proper associated with RPD/PD & O2 Sat Independence with exercise equipment Exercise tolerated well Strength training completed today  Goals Unmet:  Not Applicable  Comments: Pt able to follow exercise prescription today without complaint.  Will continue to monitor for progression.    Dr. Emily Filbert is Medical Director for [place] and [facility].

## 2017-10-28 ENCOUNTER — Encounter: Payer: 59 | Admitting: *Deleted

## 2017-10-28 DIAGNOSIS — J449 Chronic obstructive pulmonary disease, unspecified: Secondary | ICD-10-CM

## 2017-10-28 LAB — GLUCOSE, CAPILLARY
GLUCOSE-CAPILLARY: 181 mg/dL — AB (ref 70–99)
GLUCOSE-CAPILLARY: 177 mg/dL — AB (ref 70–99)

## 2017-10-28 NOTE — Progress Notes (Signed)
Daily Session Note  Patient Details  Name: Stephanie Short MRN: [id number] Date of Birth: 1950/07/12 Referring Provider:     Pulmonary Rehab from 10/20/2017 in [facility] Cardiac and Pulmonary Rehab  Referring Provider  [name]      Encounter Date: 10/28/2017  Check In: Session Check In - 10/28/17 1133      Check-In   Supervising physician immediately available to respond to emergencies  [facility] immediately available ER MD    Physician(s)  Dr. Darl Householder and Jimmye Norman    Location  [facility]-Cardiac & Pulmonary Rehab    Staff Present  Renita Papa, RN BSN;Jessica Luan Pulling, MA, RCEP, CCRP, Exercise Physiologist;Joseph Tessie Fass RCP,RRT,BSRT    Medication changes reported      No    Fall or balance concerns reported     No    Warm-up and Cool-down  Performed as group-led instruction    Resistance Training Performed  Yes    VAD Patient?  No    PAD/SET Patient?  No      Pain Assessment   Currently in Pain?  No/denies          Social History   Tobacco Use  Smoking Status Former Smoker  . Packs/day: 1.00  . Years: 20.00  . Pack years: 20.00  . Types: Cigarettes  . Last attempt to quit: 01/21/2004  . Years since quitting: 13.7  Smokeless Tobacco Never Used    Goals Met:  Proper associated with RPD/PD & O2 Sat Independence with exercise equipment Exercise tolerated well Personal goals reviewed No report of cardiac concerns or symptoms Strength training completed today  Goals Unmet:  Not Applicable  Comments: Pt able to follow exercise prescription today without complaint.  Will continue to monitor for progression.    Dr. Emily Filbert is Medical Director for [place] and [facility].

## 2017-10-30 ENCOUNTER — Encounter: Payer: 59 | Admitting: *Deleted

## 2017-10-30 DIAGNOSIS — J449 Chronic obstructive pulmonary disease, unspecified: Secondary | ICD-10-CM | POA: Diagnosis not present

## 2017-10-30 NOTE — Progress Notes (Signed)
Daily Session Note  Patient Details  Name: Stephanie Short MRN: [id number] Date of Birth: 1950/03/08 Referring Provider:     Pulmonary Rehab from 10/20/2017 in [facility] Cardiac and Pulmonary Rehab  Referring Provider  [name]      Encounter Date: 10/30/2017  Check In: Session Check In - 10/30/17 1128      Check-In   Supervising physician immediately available to respond to emergencies  [facility] immediately available ER MD    Physician(s)  Drs. Abelino Derrick    Location  [facility]-Cardiac & Pulmonary Rehab    Staff Present  Renita Papa, RN BSN; Luan Pulling, MA, RCEP, CCRP, Exercise Physiologist;Joseph Tessie Fass RCP,RRT,BSRT    Medication changes reported      No    Fall or balance concerns reported     No    Warm-up and Cool-down  Performed as group-led instruction    Resistance Training Performed  Yes    VAD Patient?  No    PAD/SET Patient?  No      Pain Assessment   Currently in Pain?  No/denies          Social History   Tobacco Use  Smoking Status Former Smoker  . Packs/day: 1.00  . Years: 20.00  . Pack years: 20.00  . Types: Cigarettes  . Last attempt to quit: 01/21/2004  . Years since quitting: 13.7  Smokeless Tobacco Never Used    Goals Met:  Proper associated with RPD/PD & O2 Sat Independence with exercise equipment Using PLB without cueing & demonstrates good technique Exercise tolerated well No report of cardiac concerns or symptoms Strength training completed today  Goals Unmet:  Not Applicable  Comments: Pt able to follow exercise prescription today without complaint.  Will continue to monitor for progression.    Dr. Emily Filbert is Medical Director for [place] and [facility].

## 2017-11-04 ENCOUNTER — Encounter: Payer: 59 | Admitting: *Deleted

## 2017-11-04 DIAGNOSIS — J449 Chronic obstructive pulmonary disease, unspecified: Secondary | ICD-10-CM

## 2017-11-04 NOTE — Progress Notes (Signed)
Daily Session Note  Patient Details  Name: Stephanie Short MRN: [id number] Date of Birth: August 24, 1950 Referring Provider:     Pulmonary Rehab from 10/20/2017 in [facility] Cardiac and Pulmonary Rehab  Referring Provider  [name]      Encounter Date: 11/04/2017  Check In: Session Check In - 11/04/17 1126      Check-In   Supervising physician immediately available to respond to emergencies  [facility] immediately available ER MD    Physician(s)  Drs. Dennard Nip    Location  [facility]-Cardiac & Pulmonary Rehab    Staff Present  Renita Papa, RN BSN;Colton Tassin Luan Pulling, [place], RCEP, CCRP, Exercise Physiologist;Joseph Tessie Fass RCP,RRT,BSRT    Medication changes reported      No    Fall or balance concerns reported     No    Warm-up and Cool-down  Performed as group-led instruction    Resistance Training Performed  Yes    VAD Patient?  No    PAD/SET Patient?  No      Pain Assessment   Currently in Pain?  No/denies          Social History   Tobacco Use  Smoking Status Former Smoker  . Packs/day: 1.00  . Years: 20.00  . Pack years: 20.00  . Types: Cigarettes  . Last attempt to quit: 01/21/2004  . Years since quitting: 13.7  Smokeless Tobacco Never Used    Goals Met:  Proper associated with RPD/PD & O2 Sat Independence with exercise equipment Using PLB without cueing & demonstrates good technique Exercise tolerated well No report of cardiac concerns or symptoms Strength training completed today  Goals Unmet:  Not Applicable  Comments: Pt able to follow exercise prescription today without complaint.  Will continue to monitor for progression.    Dr. Emily Filbert is Medical Director for [place] and [facility].

## 2017-11-05 DIAGNOSIS — I5031 Acute diastolic (congestive) heart failure: Secondary | ICD-10-CM | POA: Diagnosis not present

## 2017-11-05 DIAGNOSIS — J449 Chronic obstructive pulmonary disease, unspecified: Secondary | ICD-10-CM | POA: Diagnosis not present

## 2017-11-06 ENCOUNTER — Encounter: Payer: 59 | Admitting: *Deleted

## 2017-11-06 DIAGNOSIS — H524 Presbyopia: Secondary | ICD-10-CM | POA: Diagnosis not present

## 2017-11-06 DIAGNOSIS — J449 Chronic obstructive pulmonary disease, unspecified: Secondary | ICD-10-CM

## 2017-11-06 DIAGNOSIS — H5203 Hypermetropia, bilateral: Secondary | ICD-10-CM | POA: Diagnosis not present

## 2017-11-06 DIAGNOSIS — H52209 Unspecified astigmatism, unspecified eye: Secondary | ICD-10-CM | POA: Diagnosis not present

## 2017-11-06 NOTE — Progress Notes (Signed)
Daily Session Note  Patient Details  Name: Stephanie Short MRN: [id number] Date of Birth: October 24, 1950 Referring Provider:     Pulmonary Rehab from 10/20/2017 in [facility]  Referring Provider  [name]      Encounter Date: 11/06/2017  Check In: Session Check In - 11/06/17 1136      Check-In   Supervising physician immediately available to respond to emergencies  [facility] immediately available ER MD    Physician(s)  Dr. Quentin Cornwall and Joni Fears    Location  [facility]    Staff Present  Renita Papa, RN BSN;Joseph Hood RCP,RRT,BSRT;Amanda Oletta Darter, [place], ACSM CEP, Exercise Physiologist    Medication changes reported      No    Fall or balance concerns reported     No    Warm-up and Cool-down  Performed as group-led instruction    Resistance Training Performed  Yes    VAD Patient?  No    PAD/SET Patient?  No      Pain Assessment   Currently in Pain?  No/denies          Social History   Tobacco Use  Smoking Status Former Smoker  . Packs/day: 1.00  . Years: 20.00  . Pack years: 20.00  . Types: Cigarettes  . Last attempt to quit: 01/21/2004  . Years since quitting: 13.8  Smokeless Tobacco Never Used    Goals Met:  Proper associated with RPD/PD & O2 Sat Independence with exercise equipment Using PLB without cueing & demonstrates good technique Exercise tolerated well No report of cardiac concerns or symptoms Strength training completed today  Goals Unmet:  Not Applicable  Comments: Pt able to follow exercise prescription today without complaint.  Will continue to monitor for progression.    Dr. Emily Filbert is Medical Director for [facility] and [facility].

## 2017-11-09 DIAGNOSIS — J449 Chronic obstructive pulmonary disease, unspecified: Secondary | ICD-10-CM

## 2017-11-09 NOTE — Progress Notes (Signed)
Pulmonary Individual Treatment Plan  Patient Details  Name: Stephanie Short MRN: [id number] Date of Birth: 1950/04/07 Referring Provider:     Pulmonary Rehab from 10/20/2017 in [facility] Cardiac [facility] Pulmonary Rehab  Referring Provider  [name]      Initial Encounter Date:    Pulmonary Rehab from 10/20/2017 in [facility] Cardiac and Pulmonary Rehab  Date  10/20/17      Visit Diagnosis: Chronic obstructive pulmonary disease, unspecified COPD type ([place])  Patient's Home Medications on Admission:  Current Outpatient Medications:  .  acetaminophen (TYLENOL) 325 MG tablet, Take 2 tablets (650 mg total) by mouth every 6 (six) hours as needed for mild pain or headache. (Patient taking differently: Take 500 mg by mouth every 6 (six) hours as needed for mild pain or headache. ), Disp: 30 tablet, Rfl: 0 .  albuterol (PROVENTIL HFA;VENTOLIN HFA) 108 (90 Base) MCG/ACT inhaler, Inhale 2 puffs into the lungs every 6 (six) hours as needed for wheezing or shortness of breath., Disp: , Rfl:  .  aspirin EC 81 MG tablet, Take 81 mg by mouth at bedtime., Disp: , Rfl:  .  Blood Glucose Monitoring Suppl (FIFTY50 GLUCOSE METER 2.0) w/Device KIT, Use as directed. Dx E11.22 ONE TOUCH, Disp: , Rfl:  .  carvedilol (COREG) 6.25 MG tablet, Take 6.25 mg by mouth 2 (two) times daily with a meal., Disp: , Rfl:  .  felodipine (PLENDIL) 5 MG 24 hr tablet, Take 1 tablet (5 mg total) by mouth every morning. (Patient taking differently: Take 10 mg by mouth daily. ), Disp: 30 tablet, Rfl: 2 .  furosemide (LASIX) 40 MG tablet, Take 1 tablet (40 mg total) by mouth daily., Disp: 30 tablet, Rfl: 2 .  glimepiride (AMARYL) 2 MG tablet, Take 2 mg by mouth daily with breakfast., Disp: , Rfl:  .  ipratropium-albuterol (DUONEB) 0.5-2.5 (3) MG/3ML SOLN, Take 3 mLs by nebulization every 6 (six) hours as needed (wheezing, shortness of breath). (Patient taking differently: Take 3 mLs by nebulization 3 (three) times daily. ), Disp: 360 mL, Rfl: 1 .   ondansetron (ZOFRAN ODT) 4 MG disintegrating tablet, Allow 1-2 tablets to dissolve in your mouth every 8 hours as needed for nausea/vomiting, Disp: 30 tablet, Rfl: 0 .  ONE TOUCH ULTRA TEST test strip, USE TO CHECK BLOOD SUGAR 1-2 TIMES DAILY AS DIRECTED. DX E11.22., Disp: , Rfl: 5 .  [START ON 11/12/2017] oxyCODONE (OXY IR/ROXICODONE) 5 MG immediate release tablet, Take 0.5 tablets (2.5 mg total) by mouth every 4 (four) hours as needed for severe pain., Disp: 90 tablet, Rfl: 0 .  oxyCODONE (OXY IR/ROXICODONE) 5 MG immediate release tablet, Take 0.5 tablets (2.5 mg total) by mouth every 4 (four) hours as needed for severe pain., Disp: 90 tablet, Rfl: 0 .  potassium chloride 20 MEQ TBCR, Take 20 mEq by mouth daily. While taking lasix, Disp: 30 tablet, Rfl: 2 .  sitaGLIPtin (JANUVIA) 100 MG tablet, Take 100 mg by mouth daily., Disp: , Rfl:  .  umeclidinium-vilanterol (ANORO ELLIPTA) 62.5-25 MCG/INH AEPB, Inhale 1 puff into the lungs daily. , Disp: , Rfl:  .  vitamin B-12 (CYANOCOBALAMIN) 1000 MCG tablet, Take 1 tablet (1,000 mcg total) by mouth daily., Disp: 30 tablet, Rfl: 1  Past Medical History: Past Medical History:  Diagnosis Date  . Asthma   . CHF (congestive heart failure) ([place])   . Depression   . Diabetes mellitus   . Hepatitis C    Dr. Staci Acosta pt took part in a study,  now cured  . Hypertension   . Insomnia   . Oxygen dependent     Tobacco Use: Social History   Tobacco Use  Smoking Status Former Smoker  . Packs/day: 1.00  . Years: 20.00  . Pack years: 20.00  . Types: Cigarettes  . Last attempt to quit: 01/21/2004  . Years since quitting: 13.8  Smokeless Tobacco Never Used    Labs: Recent Chemical engineer    Labs for ITP Cardiac and Pulmonary Rehab Latest Ref Rng & Units 04/07/2007 02/06/2009 02/07/2009 05/24/2012 08/19/2017   Cholestrol 0 - 200 mg/dL - - 104 ATP III CLASSIFICATION: <200     mg/dL   Desirable 200-239  mg/dL   Borderline High >=240    mg/dL   High 82 -    LDLCALC 0 - 100 mg/dL - - 48 Total Cholesterol/HDL:CHD Risk Coronary Heart Disease Risk Table Men   Women 1/2 Average Risk   3.4   3.3 Average Risk       5.0   4.4 2 X Average Risk   9.6   7.1 3 X Average Risk  23.4   11.0 Use the calculated Patient Ratio above and the CHD Risk Table to determine the patient's CHD Risk. ATP III CLASSIFICATION (LDL): <100     mg/dL   Optimal 100-129  mg/dL   Near or Above Optimal 130-159  mg/dL   Borderline 160-189  mg/dL   High >190     mg/dL   Very High 38 -   HDL 40 - 60 mg/dL - - 45 31(L) -   Trlycerides 0 - 200 mg/dL - - 56 67 -   Hemoglobin A1c 4.8 - 5.6 % - 7.4 (NOTE) The ADA recommends the following therapeutic goal for glycemic control related to Hgb A1c measurement: Goal of therapy: <6.5 Hgb A1c  Reference: American Diabetes Association: Clinical Practice Recommendations 2010, Diabetes Care, 2010, 33: (Suppl 1).(H) - - 7.7(H)   HCO3 - 30.6(H) - - - -   TCO2 - 32 - - - -       Pulmonary Assessment Scores: Pulmonary Assessment Scores    Row Name 10/20/17 1135         ADL UCSD   ADL Phase  Entry     SOB Score total  67     Rest  0     Walk  4     Stairs  5     Bath  4     Dress  5     Shop  3       CAT Score   CAT Score  19       mMRC Score   mMRC Score  3        Pulmonary Function Assessment: Pulmonary Function Assessment - 10/20/17 1134      Initial Spirometry Results   FVC%  48 %    FEV1%  55 %    FEV1/FVC Ratio  89.17    Comments  good patient effort      Post Bronchodilator Spirometry Results   FVC%  46.08 %    FEV1%  54.45 %    FEV1/FVC Ratio  92.09    Comments  good patient effort      Breath   Bilateral Breath Sounds  Clear    Shortness of Breath  Yes;Limiting activity;Panic with Shortness of Breath       Exercise Target Goals: Exercise Program Goal: Individual exercise prescription set using results from initial  6 min walk test and THRR while considering  patient's activity barriers and  safety.   Exercise Prescription Goal: Initial exercise prescription builds to 30-45 minutes a day of aerobic activity, 2-3 days per week.  Home exercise guidelines will be given to patient during program as part of exercise prescription that the participant will acknowledge.  Activity Barriers & Risk Stratification:   6 Minute Walk: 6 Minute Walk    Row Name 10/20/17 1239         6 Minute Walk   Phase  Initial     Distance  300 feet     Walk Time  2.75 minutes     # of Rest Breaks  0     MPH  1.23     METS  1.2     RPE  11     Perceived Dyspnea   3     VO2 Peak  4.2     Symptoms  No     Resting HR  92 bpm     Resting BP  114/64     Resting Oxygen Saturation   91 %     Exercise Oxygen Saturation  during 6 min walk  78 %     Max Ex. HR  120 bpm     Max Ex. BP  142/64     2 Minute Post BP  114/60       Interval HR   1 Minute HR  110     2 Minute HR  117     3 Minute HR  120     Interval Heart Rate?  Yes       Interval Oxygen   Interval Oxygen?  Yes     Baseline Oxygen Saturation %  91 %     1 Minute Oxygen Saturation %  84 %     1 Minute Liters of Oxygen  3 L pulsed     2 Minute Oxygen Saturation %  80 %     2 Minute Liters of Oxygen  3 L pulsed     3 Minute Oxygen Saturation %  78 %     3 Minute Liters of Oxygen  3 L pulsed       Oxygen Initial Assessment: Oxygen Initial Assessment - 10/20/17 1133      Home Oxygen   Home Oxygen Device  Home Concentrator;Portable Concentrator;E-Tanks    Sleep Oxygen Prescription  CPAP    Liters per minute  2    Home Exercise Oxygen Prescription  Continuous    Liters per minute  2    Home at Rest Exercise Oxygen Prescription  Continuous    Liters per minute  2    Compliance with Home Oxygen Use  Yes      Initial 6 min Walk   Oxygen Used  Pulsed;Portable Concentrator    Liters per minute  2      Program Oxygen Prescription   Program Oxygen Prescription  Continuous;E-Tanks    Liters per minute  2      Intervention    Short Term Goals  To learn and exhibit compliance with exercise, home and travel O2 prescription;To learn and understand importance of monitoring SPO2 with pulse oximeter and demonstrate accurate use of the pulse oximeter.;To learn and understand importance of maintaining oxygen saturations>88%;To learn and demonstrate proper pursed lip breathing techniques or other breathing techniques.;To learn and demonstrate proper use of respiratory medications    Long  Term Goals  Exhibits  compliance with exercise, home and travel O2 prescription;Verbalizes importance of monitoring SPO2 with pulse oximeter and return demonstration;Maintenance of O2 saturations>88%;Exhibits proper breathing techniques, such as pursed lip breathing or other method taught during program session;Compliance with respiratory medication;Demonstrates proper use of MDI's       Oxygen Re-Evaluation: Oxygen Re-Evaluation    Row Name 10/23/17 1150             Goals/Expected Outcomes   Comments  Reviewed PLB technique with pt.  Talked about how it work and it's important to maintaining his exercise saturations.         Goals/Expected Outcomes  Short: Become more profiecient at using PLB.   Long: Become independent at using PLB.          Oxygen Discharge (Final Oxygen Re-Evaluation): Oxygen Re-Evaluation - 10/23/17 1150      Goals/Expected Outcomes   Comments  Reviewed PLB technique with pt.  Talked about how it work and it's important to maintaining his exercise saturations.      Goals/Expected Outcomes  Short: Become more profiecient at using PLB.   Long: Become independent at using PLB.       Initial Exercise Prescription: Initial Exercise Prescription - 10/20/17 1200      Date of Initial Exercise RX and Referring Provider   Date  10/20/17    Referring Provider  [name]      Oxygen   Oxygen  Continuous    Liters  3      NuStep   Level  1    SPM  80    Minutes  15    METs  1.2      Biostep-RELP   Level  1     SPM  50    Minutes  15    METs  1      Track   Laps  10    Minutes  15    METs  1.3      Prescription Details   Frequency (times per week)  3    Duration  Progress to 45 minutes of aerobic exercise without signs/symptoms of physical distress      Intensity   THRR 40-80% of Max Heartrate  116-140    Ratings of Perceived Exertion  11-13    Perceived Dyspnea  0-4      Resistance Training   Training Prescription  Yes    Weight  2 lb    Reps  10-15       Perform Capillary Blood Glucose checks as needed.  Exercise Prescription Changes: Exercise Prescription Changes    Row Name 10/26/17 1600             Response to Exercise   Blood Pressure (Admit)  102/60       Blood Pressure (Exit)  120/70       Heart Rate (Admit)  98 bpm       Heart Rate (Exercise)  117 bpm       Heart Rate (Exit)  109 bpm       Oxygen Saturation (Admit)  90 %       Oxygen Saturation (Exercise)  87 %       Oxygen Saturation (Exit)  91 %       Rating of Perceived Exertion (Exercise)  13       Perceived Dyspnea (Exercise)  1       Symptoms  desaturations       Comments  second full day of  exercise       Duration  Progress to 45 minutes of aerobic exercise without signs/symptoms of physical distress       Intensity  THRR unchanged         Progression   Progression  Continue to progress workloads to maintain intensity without signs/symptoms of physical distress.       Average METs  1.9         Resistance Training   Training Prescription  Yes       Weight  2 lbs       Reps  10-15         Interval Training   Interval Training  No         Oxygen   Oxygen  Continuous       Liters  6         NuStep   Level  1       Minutes  15       METs  2.2         Biostep-RELP   Level  1       Minutes  15       METs  2         Track   Laps  12       Minutes  15       METs  1.6          Exercise Comments:   Exercise Goals and Review: Exercise Goals    Row Name 10/20/17 1238              Exercise Goals   Increase Physical Activity  Yes       Intervention  Provide advice, education, support and counseling about physical activity/exercise needs.;Develop an individualized exercise prescription for aerobic and resistive training based on initial evaluation findings, risk stratification, comorbidities and participant's personal goals.       Expected Outcomes  Short Term: Attend rehab on a regular basis to increase amount of physical activity.;Long Term: Add in home exercise to make exercise part of routine and to increase amount of physical activity.;Long Term: Exercising regularly at least 3-5 days a week.       Increase Strength and Stamina  Yes       Intervention  Provide advice, education, support and counseling about physical activity/exercise needs.;Develop an individualized exercise prescription for aerobic and resistive training based on initial evaluation findings, risk stratification, comorbidities and participant's personal goals.       Expected Outcomes  Short Term: Increase workloads from initial exercise prescription for resistance, speed, and METs.;Short Term: Perform resistance training exercises routinely during rehab and add in resistance training at home;Long Term: Improve cardiorespiratory fitness, muscular endurance and strength as measured by increased METs and functional capacity (6MWT)       Able to understand and use rate of perceived exertion (RPE) scale  Yes       Intervention  Provide education and explanation on how to use RPE scale       Expected Outcomes  Short Term: Able to use RPE daily in rehab to express subjective intensity level;Long Term:  Able to use RPE to guide intensity level when exercising independently       Able to understand and use Dyspnea scale  Yes       Intervention  Provide education and explanation on how to use Dyspnea scale       Expected Outcomes  Short Term: Able to use Dyspnea scale  daily in rehab to express subjective sense of  shortness of breath during exertion;Long Term: Able to use Dyspnea scale to guide intensity level when exercising independently       Knowledge and understanding of Target Heart Rate Range (THRR)  Yes       Intervention  Provide education and explanation of THRR including how the numbers were predicted and where they are located for reference       Expected Outcomes  Short Term: Able to state/look up THRR;Short Term: Able to use daily as guideline for intensity in rehab;Long Term: Able to use THRR to govern intensity when exercising independently       Able to check pulse independently  Yes       Intervention  Provide education and demonstration on how to check pulse in carotid and radial arteries.;Review the importance of being able to check your own pulse for safety during independent exercise       Expected Outcomes  Short Term: Able to explain why pulse checking is important during independent exercise;Long Term: Able to check pulse independently and accurately       Understanding of Exercise Prescription  Yes       Intervention  Provide education, explanation, and written materials on patient's individual exercise prescription       Expected Outcomes  Short Term: Able to explain program exercise prescription;Long Term: Able to explain home exercise prescription to exercise independently          Exercise Goals Re-Evaluation : Exercise Goals Re-Evaluation    [place] Name 10/23/17 1150 10/26/17 1640           Exercise Goal Re-Evaluation   Exercise Goals Review  Increase Physical Activity;Increase Strength and Stamina;Able to understand and use Dyspnea scale;Able to understand and use rate of perceived exertion (RPE) scale;Understanding of Exercise Prescription;Knowledge and understanding of Target Heart Rate Range (THRR)  Increase Physical Activity;Increase Strength and Stamina;Understanding of Exercise Prescription      Comments  Reviewed RPE scale, THR and program prescription with pt today.   Pt voiced understanding and was given a copy of goals to take home.   Kathlee Nations has completed two full days of exercise.  She continues to struggle with maintaining her saturations, especially while walking.  Note was sent to Dr. [name] to increase her oxygen flow rate during exercise.  We will continue to increase her time of exercise and monitor her progress.       Expected Outcomes  Short: Use RPE daily to regulate intensity. Long: Follow program prescription in THR.  Short: Continue to attend reguarly.  Long: Continue to follow program prescription.          Discharge Exercise Prescription (Final Exercise Prescription Changes): Exercise Prescription Changes - 10/26/17 1600      Response to Exercise   Blood Pressure (Admit)  102/60    Blood Pressure (Exit)  120/70    Heart Rate (Admit)  98 bpm    Heart Rate (Exercise)  117 bpm    Heart Rate (Exit)  109 bpm    Oxygen Saturation (Admit)  90 %    Oxygen Saturation (Exercise)  87 %    Oxygen Saturation (Exit)  91 %    Rating of Perceived Exertion (Exercise)  13    Perceived Dyspnea (Exercise)  1    Symptoms  desaturations    Comments  second full day of exercise    Duration  Progress to 45 minutes of aerobic exercise without signs/symptoms of  physical distress    Intensity  THRR unchanged      Progression   Progression  Continue to progress workloads to maintain intensity without signs/symptoms of physical distress.    Average METs  1.9      Resistance Training   Training Prescription  Yes    Weight  2 lbs    Reps  10-15      Interval Training   Interval Training  No      Oxygen   Oxygen  Continuous    Liters  6      NuStep   Level  1    Minutes  15    METs  2.2      Biostep-RELP   Level  1    Minutes  15    METs  2      Track   Laps  12    Minutes  15    METs  1.6       Nutrition:  Target Goals: Understanding of nutrition guidelines, daily intake of sodium <153m, cholesterol <2083m calories 30% from fat and 7%  or less from saturated fats, daily to have 5 or more servings of fruits and vegetables.  Biometrics: Pre Biometrics - 10/20/17 1238      Pre Biometrics   Height  _0  (1.626 m)    Weight  198 lb 11.2 oz (90.1 kg)    Waist Circumference  40 inches    Hip Circumference  45 inches    Waist to Hip Ratio  0.89 %    BMI (Calculated)  34.09    Single Leg Stand  22.07 seconds        Nutrition Therapy Plan and Nutrition Goals: Nutrition Therapy & Goals - 11/04/17 1517      Nutrition Therapy   Diet  DM    Protein (specify units)  10oz    Fiber  25 grams    Whole Grain Foods  3 servings   eats brown rice   Saturated Fats  12 max. grams    Fruits and Vegetables  5 servings/day   8 ideal; has been eating less vegetables since she was put on oxygen therapy d/t eating out more   Sodium  1500 grams      Personal Nutrition Goals   Nutrition Goal  Make adjustments to your evening snack regimen. You can either work to reduce portions of your current snacks (try single serve bags for example), or swap your current snack options for more nutrient dense options such as nuts, fruit, or peanut butter + low salt pretzels/ crackers    Personal Goal #2  Start to pay more attention to the amount of sodium you are consuming, especially because you have been eating out more frequently    Personal Goal #3  Work with your husband to start planning meals ahead of time using the ideas in the packet provided    Comments  She and her husband are both diabetics and are working to have a healthier eating plan. They have been eating out more often d/t pt not having the energy to cook at home. She feels she struggles with meal planning knowing how to pair foods together at meal times to make a complete/ nutritionally balanced meal. She does not drink sugar sweetened beverages and typically eats eggs for breakfast. She does not usually eat lunch and instead may snack on some fruit mid-day. She does not eat beef. Feels  that night time snacking is  one of her worst habits      Intervention Plan   Intervention  Prescribe, educate and counsel regarding individualized specific dietary modifications aiming towards targeted core components such as weight, hypertension, lipid management, diabetes, heart failure and other comorbidities.;Nutrition handout(s) given to patient.   Quick and healthy meal ideas packet; Mediterranean diet informational packet   Expected Outcomes  Short Term Goal: A plan has been developed with personal nutrition goals set during dietitian appointment.;Long Term Goal: Adherence to prescribed nutrition plan.;Short Term Goal: Understand basic principles of dietary content, such as calories, fat, sodium, cholesterol and nutrients.       Nutrition Assessments: Nutrition Assessments - 10/20/17 1138      MEDFICTS Scores   Pre Score  69       Nutrition Goals Re-Evaluation: Nutrition Goals Re-Evaluation    [place] Name 11/04/17 1532             Goals   Nutrition Goal  Make adjustments to your evening snack regimen. You can either work to reduce portions of your current snacks (try single serve bags for example), or swap your current snack options for more nutrient dense options such as nuts, fruit, or peanut butter + low salt pretzels/ crackers       Comment  She feels that her evening snacking habits are setting her back from working towards her health and weight loss goals       Expected Outcome  She will reduce the amount of late night snacks she consumes and/or make more nutritious food choices when choosing to snack after dinner. She will work on portion control in this area         Personal Goal #2 Re-Evaluation   Personal Goal #2  Start to pay attention to the amount of sodium you are consuming, especially because you have been eating out more frequently         Personal Goal #3 Re-Evaluation   Personal Goal #3  Work with your husband to start planning meals ahead of time by using the  ideas in the packet provided          Nutrition Goals Discharge (Final Nutrition Goals Re-Evaluation): Nutrition Goals Re-Evaluation - 11/04/17 1532      Goals   Nutrition Goal  Make adjustments to your evening snack regimen. You can either work to reduce portions of your current snacks (try single serve bags for example), or swap your current snack options for more nutrient dense options such as nuts, fruit, or peanut butter + low salt pretzels/ crackers    Comment  She feels that her evening snacking habits are setting her back from working towards her health and weight loss goals    Expected Outcome  She will reduce the amount of late night snacks she consumes and/or make more nutritious food choices when choosing to snack after dinner. She will work on portion control in this area      Personal Goal #2 Re-Evaluation   Personal Goal #2  Start to pay attention to the amount of sodium you are consuming, especially because you have been eating out more frequently      Personal Goal #3 Re-Evaluation   Personal Goal #3  Work with your husband to start planning meals ahead of time by using the ideas in the packet provided       Psychosocial: Target Goals: Acknowledge presence or absence of significant depression and/or stress, maximize coping skills, provide positive support system. Participant is able to  verbalize types and ability to use techniques and skills needed for reducing stress and depression.   Initial Review & Psychosocial Screening: Initial Psych Review & Screening - 10/20/17 1138      Initial Review   Current issues with  Current Stress Concerns;Current Depression    Source of Stress Concerns  Chronic Illness;Unable to perform yard/household activities    Comments  Her COPD and shortness of breath is what stresses her out the most. She cannot walk as far and do the things she use to do.       Family Dynamics   Good Support System?  Yes    Comments  She can look to her  husband and gradnson for support.      Barriers   Psychosocial barriers to participate in program  The patient should benefit from training in stress management and relaxation.      Screening Interventions   Interventions  Encouraged to exercise;Program counselor consult;To provide support and resources with identified psychosocial needs;Provide feedback about the scores to participant    Expected Outcomes  Short Term goal: Utilizing psychosocial counselor, staff and physician to assist with identification of specific Stressors or current issues interfering with healing process. Setting desired goal for each stressor or current issue identified.;Long Term Goal: Stressors or current issues are controlled or eliminated.;Short Term goal: Identification and review with participant of any Quality of Life or Depression concerns found by scoring the questionnaire.;Long Term goal: The participant improves quality of Life and PHQ9 Scores as seen by post scores and/or verbalization of changes       Quality of Life Scores:  Scores of 19 and below usually indicate a poorer quality of life in these areas.  A difference of  2-3 points is a clinically meaningful difference.  A difference of 2-3 points in the total score of the Quality of Life Index has been associated with significant improvement in overall quality of life, self-image, physical symptoms, and general health in studies assessing change in quality of life.  PHQ-9: Recent Review Flowsheet Data    Depression screen [facility] 2/9 10/20/2017 10/13/2017 07/30/2017 03/31/2017 01/29/2017   Decreased Interest 0 0 0 0 0   Down, Depressed, Hopeless 2 1 0 0 0   PHQ - 2 Score 2 1 0 0 0   Altered sleeping 1 - - - -   Tired, decreased energy 2 - - - -   Change in appetite 2 - - - -   Feeling bad or failure about yourself  3 - - - -   Trouble concentrating 0 - - - -   Moving slowly or fidgety/restless 3 - - - -   Suicidal thoughts 0 - - - -   PHQ-9 Score 13 - - - -    Difficult doing work/chores Extremely dIfficult - - - -     Interpretation of Total Score  Total Score Depression Severity:  1-4 = Minimal depression, 5-9 = Mild depression, 10-14 = Moderate depression, 15-19 = Moderately severe depression, 20-27 = Severe depression   Psychosocial Evaluation and Intervention: Psychosocial Evaluation - 10/26/17 1219      Psychosocial Evaluation & Interventions   Interventions  Relaxation education;Encouraged to exercise with the program and follow exercise prescription    Comments  Counselor met with Ms. Harrel Lemon Kathlee Nations) for initial psychosocial evaluation.  She is a 67 year old who struggles with COPD and she has diabetes as well.  Kathlee Nations has a strong support system with a spouse  of 35 years and a grandson who lives in the home.  She has (4) adult children who live out of state.  Kathlee Nations reports sleeping well most of the time and has a good appetite.  She reports a history of depression diagnosed in 2013 and was treated with counseling.  She also reports some current anxiety symptoms and has a RX to take PRN - but reports taking it rarely.  Kathlee Nations reports her mood is impacted by her health currently - and that is her primary stressor at this time.  Counselor discussed her PHQ-9 Score of "13" which indicates moderated symptoms of depression currently, with a report of feeling bad about herself and feeling down depressed or hopeless at times.  Counselor encouraged Kathlee Nations to consider going back to counseling and she does not want to go to the same provider.  She also wants to see if this program will help with her mood.  Counselor suggested a follow up discussion in several weeks to see if her symptoms have improved.  Kathlee Nations has goals to lose weight and just to "breathe better."    Expected Outcomes  Short:  Kathlee Nations will meet with the dietician to address her weight loss goal.  She will also exercise to decrease her anxiety and improve her mood and ability to breathe better.  Long:  Kathlee Nations will  exercise consistently to improve her health and mental health    Continue Psychosocial Services   Follow up required by counselor       Psychosocial Re-Evaluation: Psychosocial Re-Evaluation    [place] Name 11/04/17 1606 11/04/17 1710           Psychosocial Re-Evaluation   Current issues with  Current Stress Concerns;History of Depression;Current Depression  -      Comments  Counselor met with Kathlee Nations and her spouse today for follow up.  She reports her mood and quality of life seem to both be getting worse.  Her spouse reports noticing less "spunk" and less energy in his wife.  She states her sleep has improved a little with the new CPAP over the past month and she may be getting approximately 6 hours of sleep now.  She has a history of grief/loss with a son that died tragically in an accident in 2013.  She went to counseling briefly immediately after this occurred but felt she "was not ready." and she also has takens some medication for anxiety - but not consistently so not very effective.  Counselor recommended Kathlee Nations find another therapist to help her through this difficult time.  Also, counselor provided contact info on potential therapists and grief support groups for her.  She is encouraged to speak with her Dr. about a medication evaluation for her depressive symptoms as well in the near future.  Kathlee Nations and her husband agreed to these recommendations.  Counselor will follow.    -      Expected Outcomes  -  Short:  Kathlee Nations will contact a counselor and research the support group to help her during this difficult time emotionally.  She will contact her Dr. about a medication evaluation once she establishes a baseline with a counselor.  Long:  Kathlee Nations will continue to practice positive self-care for her health and mental health.        Continue Psychosocial Services   Follow up required by counselor  -         Psychosocial Discharge (Final Psychosocial Re-Evaluation): Psychosocial Re-Evaluation - 11/04/17 1710  Psychosocial Re-Evaluation   Expected Outcomes  Short:  Kathlee Nations will contact a counselor and research the support group to help her during this difficult time emotionally.  She will contact her Dr. about a medication evaluation once she establishes a baseline with a counselor.  Long:  Kathlee Nations will continue to practice positive self-care for her health and mental health.         Education: Education Goals: Education classes will be provided on a weekly basis, covering required topics. Participant will state understanding/return demonstration of topics presented.  Learning Barriers/Preferences: Learning Barriers/Preferences - 10/20/17 1138      Learning Barriers/Preferences   Learning Barriers  None    Learning Preferences  None       Education Topics:  Initial Evaluation Education: - Verbal, written and demonstration of respiratory meds, oximetry and breathing techniques. Instruction on use of nebulizers and MDIs and importance of monitoring MDI activations.   Pulmonary Rehab from 11/04/2017 in [facility] Cardiac and Pulmonary Rehab  Date  10/20/17  Educator  [facility]  Instruction Review Code  1- Verbalizes Understanding      General Nutrition Guidelines/Fats and Fiber: -Group instruction provided by verbal, written material, models and posters to present the general guidelines for heart healthy nutrition. Gives an explanation and review of dietary fats and fiber.   Controlling Sodium/Reading Food Labels: -Group verbal and written material supporting the discussion of sodium use in heart healthy nutrition. Review and explanation with models, verbal and written materials for utilization of the food label.   Exercise Physiology & General Exercise Guidelines: - Group verbal and written instruction with models to review the exercise physiology of the cardiovascular system and associated critical values. Provides general exercise guidelines with specific guidelines to those with heart or lung  disease.    Aerobic Exercise & Resistance Training: - Gives group verbal and written instruction on the various components of exercise. Focuses on aerobic and resistive training programs and the benefits of this training and how to safely progress through these programs.   Pulmonary Rehab from 11/04/2017 in [facility] Cardiac and Pulmonary Rehab  Date  10/28/17  Educator  [facility]  Instruction Review Code  1- Verbalizes Understanding      Flexibility, Balance, Mind/Body Relaxation: Provides group verbal/written instruction on the benefits of flexibility and balance training, including mind/body exercise modes such as yoga, pilates and tai chi.  Demonstration and skill practice provided.   Stress and Anxiety: - Provides group verbal and written instruction about the health risks of elevated stress and causes of high stress.  Discuss the correlation between heart/lung disease and anxiety and treatment options. Review healthy ways to manage with stress and anxiety.   Depression: - Provides group verbal and written instruction on the correlation between heart/lung disease and depressed mood, treatment options, and the stigmas associated with seeking treatment.   Pulmonary Rehab from 11/04/2017 in [facility] Cardiac and Pulmonary Rehab  Date  11/04/17  Educator  [facility]  Instruction Review Code  1- Verbalizes Understanding      Exercise & Equipment Safety: - Individual verbal instruction and demonstration of equipment use and safety with use of the equipment.   Pulmonary Rehab from 11/04/2017 in [facility] Cardiac and Pulmonary Rehab  Date  10/20/17  Educator  [facility]  Instruction Review Code  1- Verbalizes Understanding      Infection Prevention: - Provides verbal and written material to individual with discussion of infection control including proper hand washing and proper equipment cleaning during exercise session.   Pulmonary  Rehab from 11/04/2017 in [facility] Cardiac and Pulmonary Rehab  Date  10/20/17  Educator   [facility]  Instruction Review Code  1- Verbalizes Understanding      Falls Prevention: - Provides verbal and written material to individual with discussion of falls prevention and safety.   Pulmonary Rehab from 11/04/2017 in [facility] Cardiac and Pulmonary Rehab  Date  10/20/17  Educator  [facility]  Instruction Review Code  1- Verbalizes Understanding      Diabetes: - Individual verbal and written instruction to review signs/symptoms of diabetes, desired ranges of glucose level fasting, after meals and with exercise. Advice that pre and post exercise glucose checks will be done for 3 sessions at entry of program.   Chronic Lung Diseases: - Group verbal and written instruction to review updates, respiratory medications, advancements in procedures and treatments. Discuss use of supplemental oxygen including available portable oxygen systems, continuous and intermittent flow rates, concentrators, personal use and safety guidelines. Review proper use of inhaler and spacers. Provide informative websites for self-education.    Energy Conservation: - Provide group verbal and written instruction for methods to conserve energy, plan and organize activities. Instruct on pacing techniques, use of adaptive equipment and posture/positioning to relieve shortness of breath.   Triggers and Exacerbations: - Group verbal and written instruction to review types of environmental triggers and ways to prevent exacerbations. Discuss weather changes, air quality and the benefits of nasal washing. Review warning signs and symptoms to help prevent infections. Discuss techniques for effective airway clearance, coughing, and vibrations.   AED/CPR: - Group verbal and written instruction with the use of models to demonstrate the basic use of the AED with the basic ABC's of resuscitation.   Anatomy and Physiology of the Lungs: - Group verbal and written instruction with the use of models to provide basic lung anatomy and  physiology related to function, structure and complications of lung disease.   Anatomy & Physiology of the Heart: - Group verbal and written instruction and models provide basic cardiac anatomy and physiology, with the coronary electrical and arterial systems. Review of Valvular disease and Heart Failure   Pulmonary Rehab from 11/04/2017 in [facility] Cardiac and Pulmonary Rehab  Date  10/30/17  Educator  [facility]  Instruction Review Code  1- Verbalizes Understanding      Cardiac Medications: - Group verbal and written instruction to review commonly prescribed medications for heart disease. Reviews the medication, class of the drug, and side effects.   Know Your Numbers and Risk Factors: -Group verbal and written instruction about important numbers in your health.  Discussion of what are risk factors and how they play a role in the disease process.  Review of Cholesterol, Blood Pressure, Diabetes, and BMI and the role they play in your overall health.   Sleep Hygiene: -Provides group verbal and written instruction about how sleep can affect your health.  Define sleep hygiene, discuss sleep cycles and impact of sleep habits. Review good sleep hygiene tips.    Other: -Provides group and verbal instruction on various topics (see comments)    Knowledge Questionnaire Score: Knowledge Questionnaire Score - 10/20/17 1138      Knowledge Questionnaire Score   Pre Score  14/18   reviewed with patient        Core Components/Risk Factors/Patient Goals at Admission: Personal Goals and Risk Factors at Admission - 10/20/17 1144      Core Components/Risk Factors/Patient Goals on Admission    Weight Management  Yes;Weight Loss  Intervention  Weight Management: Develop a combined nutrition and exercise program designed to reach desired caloric intake, while maintaining appropriate intake of nutrient and fiber, sodium and fats, and appropriate energy expenditure required for the weight goal.;Weight  Management: Provide education and appropriate resources to help participant work on and attain dietary goals.;Weight Management/Obesity: Establish reasonable short term and long term weight goals.    Admit Weight  198 lb 11.2 oz (90.1 kg)    Goal Weight: Short Term  193 lb (87.5 kg)    Goal Weight: Long Term  150 lb (68 kg)    Expected Outcomes  Short Term: Continue to assess and modify interventions until short term weight is achieved;Long Term: Adherence to nutrition and physical activity/exercise program aimed toward attainment of established weight goal;Weight Loss: Understanding of general recommendations for a balanced deficit meal plan, which promotes 1-2 lb weight loss per week and includes a negative energy balance of [phone] kcal/d;Understanding recommendations for meals to include 15-35% energy as protein, 25-35% energy from fat, 35-60% energy from carbohydrates, less than 248m of dietary cholesterol, 20-35 gm of total fiber daily;Understanding of distribution of calorie intake throughout the day with the consumption of 4-5 meals/snacks    Improve shortness of breath with ADL's  Yes    Intervention  Provide education, individualized exercise plan and daily activity instruction to help decrease symptoms of SOB with activities of daily living.    Expected Outcomes  Short Term: Improve cardiorespiratory fitness to achieve a reduction of symptoms when performing ADLs;Long Term: Be able to perform more ADLs without symptoms or delay the onset of symptoms    Diabetes  Yes    Intervention  Provide education about proper nutrition, including hydration, and aerobic/resistive exercise prescription along with prescribed medications to achieve blood glucose in normal ranges: Fasting glucose 65-99 mg/dL;Provide education about signs/symptoms and action to take for hypo/hyperglycemia.    Expected Outcomes  Long Term: Attainment of HbA1C < 7%.;Short Term: Participant verbalizes understanding of the  signs/symptoms and immediate care of hyper/hypoglycemia, proper foot care and importance of medication, aerobic/resistive exercise and nutrition plan for blood glucose control.    Hypertension  Yes    Intervention  Provide education on lifestyle modifcations including regular physical activity/exercise, weight management, moderate sodium restriction and increased consumption of fresh fruit, vegetables, and low fat dairy, alcohol moderation, and smoking cessation.;Monitor prescription use compliance.    Expected Outcomes  Short Term: Continued assessment and intervention until BP is < 140/953mHG in hypertensive participants. < 130/8040mG in hypertensive participants with diabetes, heart failure or chronic kidney disease.;Long Term: Maintenance of blood pressure at goal levels.       Core Components/Risk Factors/Patient Goals Review:    Core Components/Risk Factors/Patient Goals at Discharge (Final Review):    ITP Comments: ITP Comments    Row Name 10/20/17 1108 10/23/17 1151 11/09/17 0901       ITP Comments  Medical Evaluation completed. Chart sent for review and changes to Dr. MarEmily Filbertrector of [facility]  First full day of exercise!  Patient was oriented to gym and equipment including functions, settings, policies, and procedures.  Patient's individual exercise prescription and treatment plan were reviewed.  All starting workloads were established based on the results of the 6 minute walk test done at initial orientation visit.  The plan for exercise progression was also introduced and progression will be customized based on patient's performance and goals  30 day review completed. ITP  sent to Dr. Emily Filbert Director of [place]. Continue with ITP unless changes are made by physician.        Comments: 30 day review

## 2017-11-09 NOTE — Progress Notes (Signed)
Daily Session Note  Patient Details  Name: Stephanie Short MRN: [id number] Date of Birth: January 13, 1951 Referring Provider:     Pulmonary Rehab from 10/20/2017 in [facility] Cardiac and Pulmonary Rehab  Referring Provider  [name]      Encounter Date: 11/09/2017  Check In:      Social History   Tobacco Use  Smoking Status Former Smoker  . Packs/day: 1.00  . Years: 20.00  . Pack years: 20.00  . Types: Cigarettes  . Last attempt to quit: 01/21/2004  . Years since quitting: 13.8  Smokeless Tobacco Never Used    Goals Met:  Proper associated with RPD/PD & O2 Sat Independence with exercise equipment Exercise tolerated well Strength training completed today  Goals Unmet:  Not Applicable  Comments: Pt able to follow exercise prescription today without complaint.  Will continue to monitor for progression.    Dr. Emily Filbert is Medical Director for [place] and [facility].

## 2017-11-12 ENCOUNTER — Ambulatory Visit
Admission: RE | Admit: 2017-11-12 | Discharge: 2017-11-12 | Disposition: A | Discharge status: Home / Self Care | Payer: 59 | Source: Ambulatory Visit | Attending: Internal Medicine | Admitting: Internal Medicine

## 2017-11-12 DIAGNOSIS — Z1231 Encounter for screening mammogram for malignant neoplasm of breast: Secondary | ICD-10-CM | POA: Diagnosis present

## 2017-11-13 ENCOUNTER — Encounter: Payer: 59 | Admitting: *Deleted

## 2017-11-13 DIAGNOSIS — J449 Chronic obstructive pulmonary disease, unspecified: Secondary | ICD-10-CM | POA: Diagnosis not present

## 2017-11-13 NOTE — Progress Notes (Signed)
Daily Session Note  Patient Details  Name: Stephanie Short MRN: [id number] Date of Birth: Mar 11, 1950 Referring Provider:     Pulmonary Rehab from 10/20/2017 in [facility] Cardiac and Pulmonary Rehab  Referring Provider  [name]      Encounter Date: 11/13/2017  Check In: Session Check In - 11/13/17 1124      Check-In   Supervising physician immediately available to respond to emergencies  [facility] immediately available ER MD    Physician(s)  Drs. Siadecki and Optician, dispensing & Pulmonary Rehab    Staff Present  Renita Papa, RN BSN;Zyan Mirkin Luan Pulling, MA, RCEP, CCRP, Exercise Physiologist;Joseph Tessie Fass RCP,RRT,BSRT    Medication changes reported      No    Fall or balance concerns reported     No    Warm-up and Cool-down  Performed as group-led instruction    Resistance Training Performed  Yes    VAD Patient?  No    PAD/SET Patient?  No      Pain Assessment   Currently in Pain?  No/denies          Social History   Tobacco Use  Smoking Status Former Smoker  . Packs/day: 1.00  . Years: 20.00  . Pack years: 20.00  . Types: Cigarettes  . Last attempt to quit: 01/21/2004  . Years since quitting: 13.8  Smokeless Tobacco Never Used    Goals Met:  Proper associated with RPD/PD & O2 Sat Independence with exercise equipment Using PLB without cueing & demonstrates good technique Exercise tolerated well No report of cardiac concerns or symptoms Strength training completed today  Goals Unmet:  Not Applicable  Comments: Pt able to follow exercise prescription today without complaint.  Will continue to monitor for progression.    Dr. Emily Filbert is Medical Director for [place] and [facility].

## 2017-11-16 DIAGNOSIS — J449 Chronic obstructive pulmonary disease, unspecified: Secondary | ICD-10-CM | POA: Diagnosis not present

## 2017-11-16 DIAGNOSIS — G4733 Obstructive sleep apnea (adult) (pediatric): Secondary | ICD-10-CM | POA: Insufficient documentation

## 2017-11-16 DIAGNOSIS — I2721 Secondary pulmonary arterial hypertension: Secondary | ICD-10-CM | POA: Insufficient documentation

## 2017-11-16 DIAGNOSIS — J9611 Chronic respiratory failure with hypoxia: Secondary | ICD-10-CM | POA: Insufficient documentation

## 2017-11-16 NOTE — Progress Notes (Signed)
Daily Session Note  Patient Details  Name: Stephanie Short MRN: [id number] Date of Birth: 25-Jan-1950 Referring Provider:     Pulmonary Rehab from 10/20/2017 in [facility] Cardiac and Pulmonary Rehab  Referring Provider  [name]      Encounter Date: 11/16/2017  Check In:      Social History   Tobacco Use  Smoking Status Former Smoker  . Packs/day: 1.00  . Years: 20.00  . Pack years: 20.00  . Types: Cigarettes  . Last attempt to quit: 01/21/2004  . Years since quitting: 13.8  Smokeless Tobacco Never Used    Goals Met:  Proper associated with RPD/PD & O2 Sat Independence with exercise equipment Exercise tolerated well Strength training completed today  Goals Unmet:  Not Applicable  Comments: Pt able to follow exercise prescription today without complaint.  Will continue to monitor for progression.    Dr. Emily Filbert is Medical Director for [place] and [facility].

## 2017-11-17 DIAGNOSIS — Z79899 Other long term (current) drug therapy: Secondary | ICD-10-CM | POA: Diagnosis not present

## 2017-11-17 DIAGNOSIS — I270 Primary pulmonary hypertension: Secondary | ICD-10-CM | POA: Diagnosis not present

## 2017-11-17 DIAGNOSIS — I2721 Secondary pulmonary arterial hypertension: Secondary | ICD-10-CM | POA: Diagnosis not present

## 2017-11-17 DIAGNOSIS — R0602 Shortness of breath: Secondary | ICD-10-CM | POA: Diagnosis not present

## 2017-11-17 DIAGNOSIS — R918 Other nonspecific abnormal finding of lung field: Secondary | ICD-10-CM | POA: Diagnosis not present

## 2017-11-18 DIAGNOSIS — J449 Chronic obstructive pulmonary disease, unspecified: Secondary | ICD-10-CM

## 2017-11-18 NOTE — Progress Notes (Signed)
Daily Session Note  Patient Details  Name: Stephanie Short MRN: [id number] Date of Birth: 07-05-1950 Referring Provider:     Pulmonary Rehab from 10/20/2017 in [facility] Cardiac and Pulmonary Rehab  Referring Provider  [name]      Encounter Date: 11/18/2017  Check In: Session Check In - 11/18/17 1140      Check-In   Supervising physician immediately available to respond to emergencies  [facility] immediately available ER MD    Physician(s)  Dr. Reita Cliche and Quentin Cornwall    Location  [facility]-Cardiac & Pulmonary Rehab    Staff Present  Renita Papa, RN BSN;Jessica Luan Pulling, MA, RCEP, CCRP, Exercise Physiologist;Shunte Senseney Tessie Fass RCP,RRT,BSRT    Medication changes reported      No    Fall or balance concerns reported     No    Warm-up and Cool-down  Performed as group-led instruction    Resistance Training Performed  Yes    VAD Patient?  No    PAD/SET Patient?  No      Pain Assessment   Currently in Pain?  No/denies          Social History   Tobacco Use  Smoking Status Former Smoker  . Packs/day: 1.00  . Years: 20.00  . Pack years: 20.00  . Types: Cigarettes  . Last attempt to quit: 01/21/2004  . Years since quitting: 13.8  Smokeless Tobacco Never Used    Goals Met:  Independence with exercise equipment Exercise tolerated well No report of cardiac concerns or symptoms Strength training completed today  Goals Unmet:  Not Applicable  Comments: Pt able to follow exercise prescription today without complaint.  Will continue to monitor for progression.    Dr. Emily Filbert is Medical Director for [place] and [facility].

## 2017-11-19 DIAGNOSIS — Z85038 Personal history of other malignant neoplasm of large intestine: Secondary | ICD-10-CM | POA: Diagnosis not present

## 2017-11-20 ENCOUNTER — Encounter: Payer: 59 | Attending: Specialist

## 2017-11-20 DIAGNOSIS — Z87891 Personal history of nicotine dependence: Secondary | ICD-10-CM | POA: Insufficient documentation

## 2017-11-20 DIAGNOSIS — J449 Chronic obstructive pulmonary disease, unspecified: Secondary | ICD-10-CM | POA: Insufficient documentation

## 2017-11-20 DIAGNOSIS — G4700 Insomnia, unspecified: Secondary | ICD-10-CM | POA: Insufficient documentation

## 2017-11-20 DIAGNOSIS — Z79899 Other long term (current) drug therapy: Secondary | ICD-10-CM | POA: Insufficient documentation

## 2017-11-20 DIAGNOSIS — I110 Hypertensive heart disease with heart failure: Secondary | ICD-10-CM | POA: Insufficient documentation

## 2017-11-20 DIAGNOSIS — E119 Type 2 diabetes mellitus without complications: Secondary | ICD-10-CM | POA: Insufficient documentation

## 2017-11-20 DIAGNOSIS — F329 Major depressive disorder, single episode, unspecified: Secondary | ICD-10-CM | POA: Insufficient documentation

## 2017-11-20 DIAGNOSIS — Z7984 Long term (current) use of oral hypoglycemic drugs: Secondary | ICD-10-CM | POA: Insufficient documentation

## 2017-11-20 DIAGNOSIS — Z7982 Long term (current) use of aspirin: Secondary | ICD-10-CM | POA: Insufficient documentation

## 2017-11-20 DIAGNOSIS — Z9981 Dependence on supplemental oxygen: Secondary | ICD-10-CM | POA: Insufficient documentation

## 2017-11-20 DIAGNOSIS — I509 Heart failure, unspecified: Secondary | ICD-10-CM | POA: Insufficient documentation

## 2017-11-24 ENCOUNTER — Other Ambulatory Visit: Payer: Self-pay | Admitting: Internal Medicine

## 2017-11-24 DIAGNOSIS — R9389 Abnormal findings on diagnostic imaging of other specified body structures: Secondary | ICD-10-CM

## 2017-11-25 ENCOUNTER — Encounter: Payer: 59 | Admitting: *Deleted

## 2017-11-25 DIAGNOSIS — Z7984 Long term (current) use of oral hypoglycemic drugs: Secondary | ICD-10-CM | POA: Diagnosis not present

## 2017-11-25 DIAGNOSIS — I110 Hypertensive heart disease with heart failure: Secondary | ICD-10-CM | POA: Diagnosis not present

## 2017-11-25 DIAGNOSIS — Z9981 Dependence on supplemental oxygen: Secondary | ICD-10-CM | POA: Diagnosis not present

## 2017-11-25 DIAGNOSIS — Z87891 Personal history of nicotine dependence: Secondary | ICD-10-CM | POA: Diagnosis not present

## 2017-11-25 DIAGNOSIS — E119 Type 2 diabetes mellitus without complications: Secondary | ICD-10-CM | POA: Diagnosis not present

## 2017-11-25 DIAGNOSIS — I509 Heart failure, unspecified: Secondary | ICD-10-CM | POA: Diagnosis not present

## 2017-11-25 DIAGNOSIS — J449 Chronic obstructive pulmonary disease, unspecified: Secondary | ICD-10-CM

## 2017-11-25 DIAGNOSIS — G4700 Insomnia, unspecified: Secondary | ICD-10-CM | POA: Diagnosis not present

## 2017-11-25 DIAGNOSIS — Z7982 Long term (current) use of aspirin: Secondary | ICD-10-CM | POA: Diagnosis not present

## 2017-11-25 DIAGNOSIS — Z79899 Other long term (current) drug therapy: Secondary | ICD-10-CM | POA: Diagnosis not present

## 2017-11-25 DIAGNOSIS — F329 Major depressive disorder, single episode, unspecified: Secondary | ICD-10-CM | POA: Diagnosis not present

## 2017-11-25 NOTE — Progress Notes (Signed)
Daily Session Note  Patient Details  Name: Stephanie Short MRN: [id number] Date of Birth: January 15, 1951 Referring Provider:     Pulmonary Rehab from 10/20/2017 in [facility] and Pul[facility] Rehab  Referring Provider  [name]      Encounter Date: 11/25/2017  Check In: Session Check In - 11/25/17 1124      Check-In   Supervising physician immediately available to respond to emergencies  [facility] immediately available ER MD    Physician(s)  Drs. Sharlene Dory    Location  [facility]-Cardiac & Pulmonary Rehab    Staff Present  Renita Papa, RN BSN;Jessica Luan Pulling, MA, RCEP, CCRP, Exercise Physiologist;Joseph Tessie Fass RCP,RRT,BSRT    Medication changes reported      No    Fall or balance concerns reported     No    Warm-up and Cool-down  Performed as group-led instruction    Resistance Training Performed  Yes    VAD Patient?  No    PAD/SET Patient?  No      Pain Assessment   Currently in Pain?  No/denies          Social History   Tobacco Use  Smoking Status Former Smoker  . Packs/day: 1.00  . Years: 20.00  . Pack years: 20.00  . Types: Cigarettes  . Last attempt to quit: 01/21/2004  . Years since quitting: 13.8  Smokeless Tobacco Never Used    Goals Met:  Proper associated with RPD/PD & O2 Sat Independence with exercise equipment Using PLB without cueing & demonstrates good technique Exercise tolerated well No report of cardiac concerns or symptoms Strength training completed today  Goals Unmet:  Not Applicable  Comments: Pt able to follow exercise prescription today without complaint.  Will continue to monitor for progression. Reviewed home exercise with pt today.  Pt plans to walk and go to gym up the street for exercise.  Reviewed THR, pulse, RPE, sign and symptoms, and when to call 911 or MD.  Also discussed weather considerations and indoor options.  Pt voiced understanding.   Dr. Emily Filbert is Medical Director for [place] and  [facility].

## 2017-11-27 ENCOUNTER — Encounter: Payer: 59 | Admitting: *Deleted

## 2017-11-27 DIAGNOSIS — J449 Chronic obstructive pulmonary disease, unspecified: Secondary | ICD-10-CM

## 2017-11-27 NOTE — Progress Notes (Signed)
Daily Session Note  Patient Details  Name: Stephanie Short MRN: [id number] Date of Birth: August 26, 1950 Referring Provider:     Pulmonary Rehab from 10/20/2017 in [facility] Card[facility] and Pulmonary Rehab  Referring Provider  [name]      Encounter Date: 11/27/2017  Check In: Session Check In - 11/27/17 1130      Check-In   Supervising physician immediately available to respond to emergencies  [facility] immediately available ER MD    Physician(s)  Drs. Cinda Quest and Opt[facility], dispensing & Pulmonary Rehab    Staff Present  Renita Papa, RN BSN;Daylah Sayavong Luan Pulling, [place], RCEP, CCRP, Exercise Physiologist;Joseph Tessie Fass RCP,RRT,BSRT    Medication changes reported      No    Fall or balance concerns reported     No    Warm-up and Cool-down  Performed as group-led instruction    Resistance Training Performed  Yes    VAD Patient?  No    PAD/SET Patient?  No      Pain Assessment   Currently in Pain?  No/denies          Social History   Tobacco Use  Smoking Status Former Smoker  . Packs/day: 1.00  . Years: 20.00  . Pack years: 20.00  . Types: Cigarettes  . Last attempt to quit: 01/21/2004  . Years since quitting: 13.8  Smokeless Tobacco Never Used    Goals Met:  Proper associated with RPD/PD & O2 Sat Independence with exercise equipment Using PLB without cueing & demonstrates good technique Exercise tolerated well No report of cardiac concerns or symptoms Strength training completed today  Goals Unmet:  Not Applicable  Comments: Pt able to follow exercise prescription today without complaint.  Will continue to monitor for progression.    Dr. Emily Filbert is Medical Director for [place] and [facility].

## 2017-11-30 DIAGNOSIS — J449 Chronic obstructive pulmonary disease, unspecified: Secondary | ICD-10-CM

## 2017-11-30 NOTE — Progress Notes (Signed)
Daily Session Note  Patient Details  Name: Stephanie Short MRN: [id number] Date of Birth: Aug 10, 1950 Referring Provider:     Pulmonary Rehab from 10/20/2017 in [facility] Card[facility] and Pulmonary Rehab  Referring Provider  [name]      Encounter Date: 11/30/2017  Check In: Session Check In - 11/30/17 1211      Check-In   Supervising physician immediately available to respond to emergencies  [facility] immediately available ER MD    Physician(s)   Dr. Jacqualine Code and Jimmye Norman    Location  [facility]-Cardiac & Pulmonary Rehab    Staff Present  Justin Mend RCP,RRT,BSRT;Laureen Owens Shark, BS, RRT, Respiratory Bertis Ruddy, BS, ACSM CEP, Exercise Physiologist    Medication changes reported      No    Fall or balance concerns reported     No    Warm-up and Cool-down  Performed as group-led instruction    Resistance Training Performed  Yes    VAD Patient?  No    PAD/SET Patient?  No      Pain Assessment   Currently in Pain?  No/denies          Social History   Tobacco Use  Smoking Status Former Smoker  . Packs/day: 1.00  . Years: 20.00  . Pack years: 20.00  . Types: Cigarettes  . Last attempt to quit: 01/21/2004  . Years since quitting: 13.8  Smokeless Tobacco Never Used    Goals Met:  Independence with exercise equipment Exercise tolerated well No report of cardiac concerns or symptoms Strength training completed today  Goals Unmet:  Not Applicable  Comments: Pt able to follow exercise prescription today without complaint.  Will continue to monitor for progression.    Dr. Emily Filbert is Medical Director for [place] and [facility].

## 2017-12-02 ENCOUNTER — Encounter: Payer: 59 | Admitting: *Deleted

## 2017-12-02 DIAGNOSIS — J449 Chronic obstructive pulmonary disease, unspecified: Secondary | ICD-10-CM

## 2017-12-02 NOTE — Progress Notes (Signed)
Daily Session Note  Patient Details  Name: Stephanie Short MRN: [id number] Date of Birth: April 19, 1950 Referring Provider:     Pulmonary Rehab from 10/20/2017 in [facility] Cardiac and Pulmonary Rehab  Referring Provider  [name]      Encounter Date: 12/02/2017  Check In: Session Check In - 12/02/17 1140      Check-In   Supervising physician immediately available to respond to emergencies  [facility] immediately available ER MD    Physician(s)  Dr. Reita Cliche and Darl Householder    Location  [facility]-Cardiac & Pulmonary Rehab    Staff Present  Justin Mend Lorre Nick, MA, RCEP, CCRP, Exercise Physiologist;Calvina Liptak Sherryll Burger, RN BSN    Medication changes reported      No    Fall or balance concerns reported     No    Warm-up and Cool-down  Performed as group-led Higher education careers adviser Performed  Yes    VAD Patient?  No    PAD/SET Patient?  No      Pain Assessment   Currently in Pain?  No/denies          Social History   Tobacco Use  Smoking Status Former Smoker  . Packs/day: 1.00  . Years: 20.00  . Pack years: 20.00  . Types: Cigarettes  . Last attempt to quit: 01/21/2004  . Years since quitting: 13.8  Smokeless Tobacco Never Used    Goals Met:  Proper associated with RPD/PD & O2 Sat Independence with exercise equipment Using PLB without cueing & demonstrates good technique Exercise tolerated well No report of cardiac concerns or symptoms Strength training completed today  Goals Unmet:  Not Applicable  Comments: Pt able to follow exercise prescription today without complaint.  Will continue to monitor for progression.    Dr. Emily Filbert is Medical Director for [place] and [facility].

## 2017-12-03 ENCOUNTER — Ambulatory Visit: Payer: 59

## 2017-12-07 DIAGNOSIS — J449 Chronic obstructive pulmonary disease, unspecified: Secondary | ICD-10-CM | POA: Diagnosis not present

## 2017-12-07 NOTE — Progress Notes (Signed)
Pulmonary Individual Treatment Plan  Patient Details  Name: Stephanie Short MRN: [id number] Date of Birth: 1950-10-31 Referring Provider:     Pulmonary Rehab from 10/20/2017 in [facility] Cardiac and Pulmonary Rehab  Referring Provider  [name]      Initial Encounter Date:    Pulmonary Rehab from 10/20/2017 in [facility] ([place] Cardiac and Pulmonary Rehab  Date  10/20/17      Visit Diagnosis: Chronic obstructive pulmonary disease, unspecified COPD type ([place])  Patient's Home Medications on Admission:  Current Outpatient Medications:  .  acetaminophen (TYLENOL) 325 MG tablet, Take 2 tablets (650 mg total) by mouth every 6 (six) hours as needed for mild pain or headache. (Patient taking differently: Take 500 mg by mouth every 6 (six) hours as needed for mild pain or headache. ), Disp: 30 tablet, Rfl: 0 .  albuterol (PROVENTIL HFA;VENTOLIN HFA) 108 (90 Base) MCG/ACT inhaler, Inhale 2 puffs into the lungs every 6 (six) hours as needed for wheezing or shortness of breath., Disp: , Rfl:  .  aspirin EC 81 MG tablet, Take 81 mg by mouth at bedtime., Disp: , Rfl:  .  Blood Glucose Monitoring Suppl (FIFTY50 GLUCOSE METER 2.0) w/Device KIT, Use as directed. Dx E11.22 ONE TOUCH, Disp: , Rfl:  .  carvedilol (COREG) 6.25 MG tablet, Take 6.25 mg by mouth 2 (two) times daily with a meal., Disp: , Rfl:  .  felodipine (PLENDIL) 5 MG 24 hr tablet, Take 1 tablet (5 mg total) by mouth every morning. (Patient taking differently: Take 10 mg by mouth daily. ), Disp: 30 tablet, Rfl: 2 .  furosemide (LASIX) 40 MG tablet, Take 1 tablet (40 mg total) by mouth daily., Disp: 30 tablet, Rfl: 2 .  glimepiride (AMARYL) 2 MG tablet, Take 2 mg by mouth daily with breakfast., Disp: , Rfl:  .  ipratropium-albuterol (DUONEB) 0.5-2.5 (3) MG/3ML SOLN, Take 3 mLs by nebulization every 6 (six) hours as needed (wheezing, shortness of breath). (Patient taking differently: Take 3 mLs by nebulization 3 (three) times daily. ), Disp: 360 mL, Rfl: 1 .   ondansetron (ZOFRAN ODT) 4 MG disintegrating tablet, Allow 1-2 tablets to dissolve in your mouth every 8 hours as needed for nausea/vomiting, Disp: 30 tablet, Rfl: 0 .  ONE TOUCH ULTRA TEST test strip, USE TO CHECK BLOOD SUGAR 1-2 TIMES DAILY AS DIRECTED. DX E11.22., Disp: , Rfl: 5 .  oxyCODONE (OXY IR/ROXICODONE) 5 MG immediate release tablet, Take 0.5 tablets (2.5 mg total) by mouth every 4 (four) hours as needed for severe pain., Disp: 90 tablet, Rfl: 0 .  potassium chloride 20 MEQ TBCR, Take 20 mEq by mouth daily. While taking lasix, Disp: 30 tablet, Rfl: 2 .  sitaGLIPtin (JANUVIA) 100 MG tablet, Take 100 mg by mouth daily., Disp: , Rfl:  .  umeclidinium-vilanterol (ANORO ELLIPTA) 62.5-25 MCG/INH AEPB, Inhale 1 puff into the lungs daily. , Disp: , Rfl:  .  vitamin B-12 (CYANOCOBALAMIN) 1000 MCG tablet, Take 1 tablet (1,000 mcg total) by mouth daily., Disp: 30 tablet, Rfl: 1  Past Medical History: Past Medical History:  Diagnosis Date  . Asthma   . CHF (congestive heart failure) ([place])   . Depression   . Diabetes mellitus   . Hepatitis C    Dr. 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 Hypertension   . Insomnia   . Oxygen dependent     Tobacco Use: Social History   Tobacco Use  Smoking Status Former Smoker  . Packs/day: 1.00  .  Years: 20.00  . Pack years: 20.00  . Types: Cigarettes  . Last attempt to quit: 01/21/2004  . Years since quitting: 13.8  Smokeless Tobacco Never Used    Labs: Recent Chemical engineer    Labs for ITP Cardiac and Pulmonary Rehab Latest Ref Rng & Units 12-29-2007 02/06/2009 02/07/2009 05/24/2012 08/19/2017   Cholestrol 0 - 200 mg/dL - - 104 ATP III CLASSIFICATION: <200     mg/dL   Desirable 200-239  mg/dL   Borderline High >=240    mg/dL   High 82 -   LDLCALC 0 - 100 mg/dL - - 48 Total Cholesterol/HDL:CHD Risk Coronary Heart Disease Risk Table Men   Women 1/2 Average Risk   3.4   3.3 Average Risk       5.0   4.4 2 X Average Risk   9.6    7.1 3 X Average Risk  23.4   11.0 Use the calculated Patient Ratio above and the CHD Risk Table to determine the patient's CHD Risk. ATP III CLASSIFICATION (LDL): <100     mg/dL   Optimal 100-129  mg/dL   Near or Above Optimal 130-159  mg/dL   Borderline 160-189  mg/dL   High >190     mg/dL   Very High 38 -   HDL 40 - 60 mg/dL - - 45 31(L) -   Trlycerides 0 - 200 mg/dL - - 56 67 -   Hemoglobin A1c 4.8 - 5.6 % - 7.4 (NOTE) The ADA recommends the following therapeutic goal for glycemic control related to Hgb A1c measurement: Goal of therapy: <6.5 Hgb A1c  Reference: American Diabetes Association: Clinical Practice Recommendations 2010, Diabetes Care, 2010, 33: (Suppl 1).(H) - - 7.7(H)   HCO3 - 30.6(H) - - - -   TCO2 - 32 - - - -       Pulmonary Assessment Scores: Pulmonary Assessment Scores    Row Name 10/20/17 1135         ADL UCSD   ADL Phase  Entry     SOB Score total  67     Rest  0     Walk  4     Stairs  5     Bath  4     Dress  5     Shop  3       CAT Score   CAT Score  19       mMRC Score   mMRC Score  3        Pulmonary Function Assessment: Pulmonary Function Assessment - 10/20/17 1134      Initial Spirometry Results   FVC%  48 %    FEV1%  55 %    FEV1/FVC Ratio  89.17    Comments  good patient effort      Post Bronchodilator Spirometry Results   FVC%  46.08 %    FEV1%  54.45 %    FEV1/FVC Ratio  92.09    Comments  good patient effort      Breath   Bilateral Breath Sounds  Clear    Shortness of Breath  Yes;Limiting activity;Panic with Shortness of Breath       Exercise Target Goals: Exercise Program Goal: Individual exercise prescription set using results from initial 6 min walk test and THRR while considering  patient's activity barriers and safety.   Exercise Prescription Goal: Initial exercise prescription builds to 30-45 minutes a day of aerobic activity, 2-3 days per week.  Home  exercise guidelines will be given to patient during  program as part of exercise prescription that the participant will acknowledge.  Activity Barriers & Risk Stratification:   6 Minute Walk: 6 Minute Walk    Row Name 10/20/17 1239         6 Minute Walk   Phase  Initial     Distance  300 feet     Walk Time  2.75 minutes     # of Rest Breaks  0     MPH  1.23     METS  1.2     RPE  11     Perceived Dyspnea   3     VO2 Peak  4.2     Symptoms  No     Resting HR  92 bpm     Resting BP  114/64     Resting Oxygen Saturation   91 %     Exercise Oxygen Saturation  during 6 min walk  78 %     Max Ex. HR  120 bpm     Max Ex. BP  142/64     2 Minute Post BP  114/60       Interval HR   1 Minute HR  110     2 Minute HR  117     3 Minute HR  120     Interval Heart Rate?  Yes       Interval Oxygen   Interval Oxygen?  Yes     Baseline Oxygen Saturation %  91 %     1 Minute Oxygen Saturation %  84 %     1 Minute Liters of Oxygen  3 L pulsed     2 Minute Oxygen Saturation %  80 %     2 Minute Liters of Oxygen  3 L pulsed     3 Minute Oxygen Saturation %  78 %     3 Minute Liters of Oxygen  3 L pulsed       Oxygen Initial Assessment: Oxygen Initial Assessment - 10/20/17 1133      Home Oxygen   Home Oxygen Device  Home Concentrator;Portable Concentrator;E-Tanks    Sleep Oxygen Prescription  CPAP    Liters per minute  2    Home Exercise Oxygen Prescription  Continuous    Liters per minute  2    Home at Rest Exercise Oxygen Prescription  Continuous    Liters per minute  2    Compliance with Home Oxygen Use  Yes      Initial 6 min Walk   Oxygen Used  Pulsed;Portable Concentrator    Liters per minute  2      Program Oxygen Prescription   Program Oxygen Prescription  Continuous;E-Tanks    Liters per minute  2      Intervention   Short Term Goals  To learn and exhibit compliance with exercise, home and travel O2 prescription;To learn and understand importance of monitoring SPO2 with pulse oximeter and demonstrate accurate use  of the pulse oximeter.;To learn and understand importance of maintaining oxygen saturations>88%;To learn and demonstrate proper pursed lip breathing techniques or other breathing techniques.;To learn and demonstrate proper use of respiratory medications    Long  Term Goals  Exhibits compliance with exercise, home and travel O2 prescription;Verbalizes importance of monitoring SPO2 with pulse oximeter and return demonstration;Maintenance of O2 saturations>88%;Exhibits proper breathing techniques, such as pursed lip breathing or other method taught during program session;Compliance with  respiratory medication;Demonstrates proper use of MDI's       Oxygen Re-Evaluation: Oxygen Re-Evaluation    Row Name 10/23/17 1150 11/18/17 1356 11/18/17 1417         Program Oxygen Prescription   Program Oxygen Prescription  -  Continuous;E-Tanks  Continuous;E-Tanks     Liters per minute  -  4  -     Comments  -  6 liters when walking  6 liters when walking       Home Oxygen   Home Oxygen Device  -  Home Concentrator;Portable Concentrator;E-Tanks  Home Concentrator;Portable Concentrator;E-Tanks     Sleep Oxygen Prescription  -  CPAP  CPAP     Liters per minute  -  2  2     Home Exercise Oxygen Prescription  -  Continuous  Continuous     Liters per minute  -  2  5     Home at Rest Exercise Oxygen Prescription  -  Continuous  Continuous     Liters per minute  -  2  2     Compliance with Home Oxygen Use  -  Yes  Yes       Goals/Expected Outcomes   Short Term Goals  -  To learn and exhibit compliance with exercise, home and travel O2 prescription;To learn and understand importance of monitoring SPO2 with pulse oximeter and demonstrate accurate use of the pulse oximeter.;To learn and understand importance of maintaining oxygen saturations>88%;To learn and demonstrate proper pursed lip breathing techniques or other breathing techniques.;To learn and demonstrate proper use of respiratory medications  To learn and  understand importance of maintaining oxygen saturations>88%;To learn and understand importance of monitoring SPO2 with pulse oximeter and demonstrate accurate use of the pulse oximeter.;To learn and exhibit compliance with exercise, home and travel O2 prescription     Long  Term Goals  -  Exhibits compliance with exercise, home and travel O2 prescription;Verbalizes importance of monitoring SPO2 with pulse oximeter and return demonstration;Maintenance of O2 saturations>88%;Exhibits proper breathing techniques, such as pursed lip breathing or other method taught during program session;Compliance with respiratory medication;Demonstrates proper use of MDI's  Exhibits compliance with exercise, home and travel O2 prescription;Verbalizes importance of monitoring SPO2 with pulse oximeter and return demonstration;Maintenance of O2 saturations>88%     Comments  Reviewed PLB technique with pt.  Talked about how it work and it's important to maintaining his exercise saturations.    Practiced PLB with patient. Patient understands why PLB is important and to use it when she is short of breath. Stephanie Short states that her doctor wants to put her on a respiratory medication that she has to qualify for and if not she may be looking to get a lung transplant. At rest her oxygen is around 95 percent at home. She needs to be on 6 liters when she is walking. On exertion her oxygen gets in the mid to low 80's but come back to the low 90 range with rest. Reviewed that oxygen saturations should be 88 percent and above. Patient has a pulse oximeter at home to check her oxygen.  Stephanie Short went to see Dr. [name] yesterday and was okayed to increase to 4-5L for oxygen when exerting, but she needs 6L to maintain saturations above 88%. We will send another note if this does not post in progress notes from office visit.  We also talked about using her e-cylinders for home exercise versus her portable concentrator as it does not give her enough flow.  Goals/Expected Outcomes  Short: Become more profiecient at using PLB.   Long: Become independent at using PLB.  Short: use PLB with exertion. Long: use PLB on exertion proficiently and independently.  Short: Increase oxygen flow at home. Long: Continue to be compliant.         Oxygen Discharge (Final Oxygen Re-Evaluation): Oxygen Re-Evaluation - 11/18/17 1417      Program Oxygen Prescription   Program Oxygen Prescription  Continuous;E-Tanks    Comments  6 liters when walking      Home Oxygen   Home Oxygen Device  Home Concentrator;Portable Concentrator;E-Tanks    Sleep Oxygen Prescription  CPAP    Liters per minute  2    Home Exercise Oxygen Prescription  Continuous    Liters per minute  5    Home at Rest Exercise Oxygen Prescription  Continuous    Liters per minute  2    Compliance with Home Oxygen Use  Yes      Goals/Expected Outcomes   Short Term Goals  To learn and understand importance of maintaining oxygen saturations>88%;To learn and understand importance of monitoring SPO2 with pulse oximeter and demonstrate accurate use of the pulse oximeter.;To learn and exhibit compliance with exercise, home and travel O2 prescription    Long  Term Goals  Exhibits compliance with exercise, home and travel O2 prescription;Verbalizes importance of monitoring SPO2 with pulse oximeter and return demonstration;Maintenance of O2 saturations>88%    Comments  Stephanie Short went to see Dr. [name] yesterday and was okayed to increase to 4-5L for oxygen when exerting, but she needs 6L to maintain saturations above 88%. We will send another note if this does not post in progress notes from office visit.  We also talked about using her e-cylinders for home exercise versus her portable concentrator as it does not give her enough flow.     Goals/Expected Outcomes  Short: Increase oxygen flow at home. Long: Continue to be compliant.        Initial Exercise Prescription: Initial Exercise Prescription - 10/20/17 1200       Date of Initial Exercise RX and Referring Provider   Date  10/20/17    Referring Provider  [name]      Oxygen   Oxygen  Continuous    Liters  3      NuStep   Level  1    SPM  80    Minutes  15    METs  1.2      Biostep-RELP   Level  1    SPM  50    Minutes  15    METs  1      Track   Laps  10    Minutes  15    METs  1.3      Prescription Details   Frequency (times per week)  3    Duration  Progress to 45 minutes of aerobic exercise without signs/symptoms of physical distress      Intensity   THRR 40-80% of Max Heartrate  116-140    Ratings of Perceived Exertion  11-13    Perceived Dyspnea  0-4      Resistance Training   Training Prescription  Yes    Weight  2 lb    Reps  10-15       Perform Capillary Blood Glucose checks as needed.  Exercise Prescription Changes: Exercise Prescription Changes    Row Name 10/26/17 1600 11/11/17 1400 11/24/17 1500 11/25/17 1400  Response to Exercise   Blood Pressure (Admit)  102/60  138/84  124/70  -    Blood Pressure (Exit)  120/70  124/82  128/72  -    Heart Rate (Admit)  98 bpm  97 bpm  109 bpm  -    Heart Rate (Exercise)  117 bpm  115 bpm  123 bpm  -    Heart Rate (Exit)  109 bpm  104 bpm  98 bpm  -    Oxygen Saturation (Admit)  90 %  93 %  89 %  -    Oxygen Saturation (Exercise)  87 %  87 %  87 %  -    Oxygen Saturation (Exit)  91 %  89 %  89 %  -    Rating of Perceived Exertion (Exercise)  _0 -    Perceived Dyspnea (Exercise)  _1 -    Symptoms  desaturations  desaturations  desaturations  -    Comments  second full day of exercise  -  -  -    Duration  Progress to 45 minutes of aerobic exercise without signs/symptoms of physical distress  Progress to 45 minutes of aerobic exercise without signs/symptoms of physical distress  Progress to 45 minutes of aerobic exercise without signs/symptoms of physical distress continues to need rest breaks on the track  -    Intensity  THRR unchanged  THRR  unchanged  THRR unchanged  -      Progression   Progression  Continue to progress workloads to maintain intensity without signs/symptoms of physical distress.  Continue to progress workloads to maintain intensity without signs/symptoms of physical distress.  Continue to progress workloads to maintain intensity without signs/symptoms of physical distress.  -    Average METs  1.9  2.47  2.21  -      Resistance Training   Training Prescription  Yes  Yes  Yes  -    Weight  2 lbs  2 lbs  2 lbs  -    Reps  10-15  10-15  10-15  -      Interval Training   Interval Training  No  No  No  -      Oxygen   Oxygen  Continuous  Continuous  Continuous  -    Liters  _2 -      NuStep   Level  _3 -    Minutes  _4 -    METs  2.2  3.8  2.8  -      Biostep-RELP   Level  _5 -    Minutes  _6 -    METs  _7 -      Track   Laps  _8 -    Minutes  _9 -    METs  1.6  1.6  1.84  -      Home Exercise Plan   Plans to continue exercise at  -  -  -  Home (comment) walking, gym up the street    Frequency  -  -  -  Add 1 additional day to program exercise sessions.    Initial Home Exercises Provided  -  -  -  11/25/17       Exercise Comments: Exercise Comments    Row Name 11/18/17 1420           Exercise Comments  Stephanie Short went to see Dr. [name] yesterday and was okayed to increase to 4-5L for oxygen when exerting, but she needs 6L to maintain saturations above 88%. We will send another note if this does not post in progress notes from office visit.  We also talked about using her e-cylinders for home exercise versus her portable concentrator as it does not give her enough flow.           Exercise Goals and Review: Exercise Goals    Row Name 10/20/17 1238             Exercise Goals   Increase Physical Activity  Yes       Intervention  Provide advice, education, support and counseling about physical activity/exercise needs.;Develop an  individualized exercise prescription for aerobic and resistive training based on initial evaluation findings, risk stratification, comorbidities and participant's personal goals.       Expected Outcomes  Short Term: Attend rehab on a regular basis to increase amount of physical activity.;Long Term: Add in home exercise to make exercise part of routine and to increase amount of physical activity.;Long Term: Exercising regularly at least 3-5 days a week.       Increase Strength and Stamina  Yes       Intervention  Provide advice, education, support and counseling about physical activity/exercise needs.;Develop an individualized exercise prescription for aerobic and resistive training based on initial evaluation findings, risk stratification, comorbidities and participant's personal goals.       Expected Outcomes  Short Term: Increase workloads from initial exercise prescription for resistance, speed, and METs.;Short Term: Perform resistance training exercises routinely during rehab and add in resistance training at home;Long Term: Improve cardiorespiratory fitness, muscular endurance and strength as measured by increased METs and functional capacity (6MWT)       Able to understand and use rate of perceived exertion (RPE) scale  Yes       Intervention  Provide education and explanation on how to use RPE scale       Expected Outcomes  Short Term: Able to use RPE daily in rehab to express subjective intensity level;Long Term:  Able to use RPE to guide intensity level when exercising independently       Able to understand and use Dyspnea scale  Yes       Intervention  Provide education and explanation on how to use Dyspnea scale       Expected Outcomes  Short Term: Able to use Dyspnea scale daily in rehab to express subjective sense of shortness of breath during exertion;Long Term: Able to use Dyspnea scale to guide intensity level when exercising independently       Knowledge and understanding of Target Heart  Rate Range (THRR)  Yes       Intervention  Provide education and explanation of THRR including how the numbers were predicted and where they are located for reference       Expected Outcomes  Short Term: Able to state/look up THRR;Short Term: Able to use daily as guideline for intensity in rehab;Long Term: Able to use THRR to govern intensity when exercising independently       Able to check pulse independently  Yes       Intervention  Provide education and demonstration on how to check pulse in carotid and  radial arteries.;Review the importance of being able to check your own pulse for safety during independent exercise       Expected Outcomes  Short Term: Able to explain why pulse checking is important during independent exercise;Long Term: Able to check pulse independently and accurately       Understanding of Exercise Prescription  Yes       Intervention  Provide education, explanation, and written materials on patient's individual exercise prescription       Expected Outcomes  Short Term: Able to explain program exercise prescription;Long Term: Able to explain home exercise prescription to exercise independently          Exercise Goals Re-Evaluation : Exercise Goals Re-Evaluation    Row Name 10/23/17 1150 10/26/17 1640 11/11/17 1404 11/24/17 1549 11/25/17 1402     Exercise Goal Re-Evaluation   Exercise Goals Review  Increase Physical Activity;Increase Strength and Stamina;Able to understand and use Dyspnea scale;Able to understand and use rate of perceived exertion (RPE) scale;Understanding of Exercise Prescription;Knowledge and understanding of Target Heart Rate Range (THRR)  Increase Physical Activity;Increase Strength and Stamina;Understanding of Exercise Prescription  Increase Physical Activity;Increase Strength and Stamina;Understanding of Exercise Prescription  Increase Physical Activity;Increase Strength and Stamina;Understanding of Exercise Prescription  Increase Physical  Activity;Increase Strength and Stamina;Understanding of Exercise Prescription;Able to understand and use rate of perceived exertion (RPE) scale;Able to understand and use Dyspnea scale;Knowledge and understanding of Target Heart Rate Range (THRR);Able to check pulse independently   Comments  Reviewed RPE scale, THR and program prescription with pt today.  Pt voiced understanding and was given a copy of goals to take home.   Stephanie Short has completed two full days of exercise.  She continues to struggle with maintaining her saturations, especially while walking.  Note was sent to Dr. [name] to increase her oxygen flow rate during exercise.  We will continue to increase her time of exercise and monitor her progress.   Stephanie Short is doing well in rehab. She is now up to 14 laps while walking.  She still has not gotten permission to increase her oxygen flow for home use. We will send the request over again.   Once cleared for a high oxygen flow, we will review her home exercise guidelines.  We will begin to increase her other pieces of equipment and continue to monitor her progress.   Stephanie Short has continued to do well in rehab.  She now has a high flow cannula for her 6L for exercise.  She is still working on getting a higher flow portable device but she does have e-cylinders she could use. We were going to go over her home exercise guidelines on her next visit but she has missed the last two sessions.  She is up to 18 laps and level 2 on the BioStep.  We will continue to monitor her progress.   Reviewed home exercise with pt today.  Pt plans to walk and go to gym up the street for exercise.  Reviewed THR, pulse, RPE, sign and symptoms, and when to call 911 or MD.  Also discussed weather considerations and indoor options.  Pt voiced understanding.   Expected Outcomes  Short: Use RPE daily to regulate intensity. Long: Follow program prescription in THR.  Short: Continue to attend reguarly.  Long: Continue to follow program prescription.    Short: Attend regularly and get clearance for higher oxygen flow rate.  Long: Continue to increase activity levels.   Short: Review home exercise guidelines.  Long: Continue to  increase activity.  Short: Start to add in at least one extra day a week at home.  Long: Continue to move more!!      Discharge Exercise Prescription (Final Exercise Prescription Changes): Exercise Prescription Changes - 11/25/17 1400      Home Exercise Plan   Plans to continue exercise at  Home (comment)   walking, gym up the street   Frequency  Add 1 additional day to program exercise sessions.    Initial Home Exercises Provided  11/25/17       Nutrition:  Target Goals: Understanding of nutrition guidelines, daily intake of sodium <1547m, cholesterol <2019m calories 30% from fat and 7% or less from saturated fats, daily to have 5 or more servings of fruits and vegetables.  Biometrics: Pre Biometrics - 10/20/17 1238      Pre Biometrics   Height  _0  (1.626 m)    Weight  198 lb 11.2 oz (90.1 kg)    Waist Circumference  40 inches    Hip Circumference  45 inches    Waist to Hip Ratio  0.89 %    BMI (Calculated)  34.09    Single Leg Stand  22.07 seconds        Nutrition Therapy Plan and Nutrition Goals: Nutrition Therapy & Goals - 11/04/17 1517      Nutrition Therapy   Diet  DM    Protein (specify units)  10oz    Fiber  25 grams    Whole Grain Foods  3 servings   eats brown rice   Saturated Fats  12 max. grams    Fruits and Vegetables  5 servings/day   8 ideal; has been eating less vegetables since she was put on oxygen therapy d/t eating out more   Sodium  1500 grams      Personal Nutrition Goals   Nutrition Goal  Make adjustments to your evening snack regimen. You can either work to reduce portions of your current snacks (try single serve bags for example), or swap your current snack options for more nutrient dense options such as nuts, fruit, or peanut butter + low salt pretzels/ crackers     Personal Goal #2  Start to pay more attention to the amount of sodium you are consuming, especially because you have been eating out more frequently    Personal Goal #3  Work with your husband to start planning meals ahead of time using the ideas in the packet provided    Comments  She and her husband are both diabetics and are working to have a healthier eating plan. They have been eating out more often d/t pt not having the energy to cook at home. She feels she struggles with meal planning knowing how to pair foods together at meal times to make a complete/ nutritionally balanced meal. She does not drink sugar sweetened beverages and typically eats eggs for breakfast. She does not usually eat lunch and instead may snack on some fruit mid-day. She does not eat beef. Feels that night time snacking is one of her worst habits      Intervention Plan   Intervention  Prescribe, educate and counsel regarding individualized specific dietary modifications aiming towards targeted core components such as weight, hypertension, lipid management, diabetes, heart failure and other comorbidities.;Nutrition handout(s) given to patient.   Quick and healthy meal ideas packet; Mediterranean diet informational packet   Expected Outcomes  Short Term Goal: A plan has been developed with personal nutrition goals set during  dietitian appointment.;Long Term Goal: Adherence to prescribed nutrition plan.;Short Term Goal: Understand basic principles of dietary content, such as calories, fat, sodium, cholesterol and nutrients.       Nutrition Assessments: Nutrition Assessments - 10/20/17 1138      MEDFICTS Scores   Pre Score  69       Nutrition Goals Re-Evaluation: Nutrition Goals Re-Evaluation    [place] Name 11/04/17 1532 11/25/17 1401           Goals   Nutrition Goal  Make adjustments to your evening snack regimen. You can either work to reduce portions of your current snacks (try single serve bags for example), or  swap your current snack options for more nutrient dense options such as nuts, fruit, or peanut butter + low salt pretzels/ crackers  Make adjustments to your evening snack regimen by reducing portions or swapping current choices for more nutritious foods; work with your husband to start planning meals ahead of time; start to pay attention to the sodium content in foods      Comment  She feels that her evening snacking habits are setting her back from working towards her health and weight loss goals  She has been choosing snack options like nuts, fruit and baked chips (single-serve bags) rather than her traditional snack options. He husband has started to cook more leading to eating less meals eaten outside of the home. He is trying to bake rather than fry and chose leaner meats (chicken, crab, fish, lean pork). She continues not to drink sugar sweetened beverages. Does not usually eat lunch      Expected Outcome  She will reduce the amount of late night snacks she consumes and/or make more nutritious food choices when choosing to snack after dinner. She will work on portion control in this area  She will continue to make more health-conscious snack choices and keep portions more controlled. She will continue to work with her husband to prepare more heart-healthy meals at home and choose lean meats most often as protein sources. When/if she is able to come off of oxygen therapy she will start to assist her husband in meal preparation        Personal Goal #2 Re-Evaluation   Personal Goal #2  Start to pay attention to the amount of sodium you are consuming, especially because you have been eating out more frequently  -        Personal Goal #3 Re-Evaluation   Personal Goal #3  Work with your husband to start planning meals ahead of time by using the ideas in the packet provided  -         Nutrition Goals Discharge (Final Nutrition Goals Re-Evaluation): Nutrition Goals Re-Evaluation - 11/25/17 1401       Goals   Nutrition Goal  Make adjustments to your evening snack regimen by reducing portions or swapping current choices for more nutritious foods; work with your husband to start planning meals ahead of time; start to pay attention to the sodium content in foods    Comment  She has been choosing snack options like nuts, fruit and baked chips (single-serve bags) rather than her traditional snack options. He husband has started to cook more leading to eating less meals eaten outside of the home. He is trying to bake rather than fry and chose leaner meats (chicken, crab, fish, lean pork). She continues not to drink sugar sweetened beverages. Does not usually eat lunch    Expected Outcome  She will  continue to make more health-conscious snack choices and keep portions more controlled. She will continue to work with her husband to prepare more heart-healthy meals at home and choose lean meats most often as protein sources. When/if she is able to come off of oxygen therapy she will start to assist her husband in meal preparation       Psychosocial: Target Goals: Acknowledge presence or absence of significant depression and/or stress, maximize coping skills, provide positive support system. Participant is able to verbalize types and ability to use techniques and skills needed for reducing stress and depression.   Initial Review & Psychosocial Screening: Initial Psych Review & Screening - 10/20/17 1138      Initial Review   Current issues with  Current Stress Concerns;Current Depression    Source of Stress Concerns  Chronic Illness;Unable to perform yard/household activities    Comments  Her COPD and shortness of breath is what stresses her out the most. She cannot walk as far and do the things she use to do.       Family Dynamics   Good Support System?  Yes    Comments  She can look to her husband and gradnson for support.      Barriers   Psychosocial barriers to participate in program  The patient  should benefit from training in stress management and relaxation.      Screening Interventions   Interventions  Encouraged to exercise;Program counselor consult;To provide support and resources with identified psychosocial needs;Provide feedback about the scores to participant    Expected Outcomes  Short Term goal: Utilizing psychosocial counselor, staff and physician to assist with identification of specific Stressors or current issues interfering with healing process. Setting desired goal for each stressor or current issue identified.;Long Term Goal: Stressors or current issues are controlled or eliminated.;Short Term goal: Identification and review with participant of any Quality of Life or Depression concerns found by scoring the questionnaire.;Long Term goal: The participant improves quality of Life and PHQ9 Scores as seen by post scores and/or verbalization of changes       Quality of Life Scores:  Scores of 19 and below usually indicate a poorer quality of life in these areas.  A difference of  2-3 points is a clinically meaningful difference.  A difference of 2-3 points in the total score of the Quality of Life Index has been associated with significant improvement in overall quality of life, self-image, physical symptoms, and general health in studies assessing change in quality of life.  PHQ-9: Recent Review Flowsheet Data    Depression screen [facility] 2/9 12/02/2017 10/20/2017 10/13/2017 07/30/2017 03/31/2017   Decreased Interest 1 0 0 0 0   Down, Depressed, Hopeless _0 0 0   PHQ - 2 Score _1 0 0   Altered sleeping 1 1 - - -   Tired, decreased energy 1 2 - - -   Change in appetite 2 2 - - -   Feeling bad or failure about yourself  1 3 - - -   Trouble concentrating 0 0 - - -   Moving slowly or fidgety/restless 1 3 - - -   Suicidal thoughts 0 0 - - -   PHQ-9 Score 8 13 - - -   Difficult doing work/chores Very difficult Extremely dIfficult - - -     Interpretation of Total Score   Total Score Depression Severity:  1-4 = Minimal depression, 5-9 = Mild depression, 10-14 = Moderate depression,  15-19 = Moderately severe depression, 20-27 = Severe depression   Psychosocial Evaluation and Intervention: Psychosocial Evaluation - 10/26/17 1219      Psychosocial Evaluation & Interventions   Interventions  Relaxation education;Encouraged to exercise with the program and follow exercise prescription    Comments  Counselor met with Stephanie Short Stephanie Short) for initial psychosocial evaluation.  She is a 67 year old who struggles with COPD and she has diabetes as well.  Stephanie Short has a strong support system with a spouse of 11 years and a grandson who lives in the home.  She has (4) adult children who live out of state.  Stephanie Short reports sleeping well most of the time and has a good appetite.  She reports a history of depression diagnosed in 2013 and was treated with counseling.  She also reports some current anxiety symptoms and has a RX to take PRN - but reports taking it rarely.  Stephanie Short reports her mood is impacted by her health currently - and that is her primary stressor at this time.  Counselor discussed her PHQ-9 Score of "13" which indicates moderated symptoms of depression currently, with a report of feeling bad about herself and feeling down depressed or hopeless at times.  Counselor encouraged Stephanie Short to consider going back to counseling and she does not want to go to the same provider.  She also wants to see if this program will help with her mood.  Counselor suggested a follow up discussion in several weeks to see if her symptoms have improved.  Stephanie Short has goals to lose weight and just to "breathe better."    Expected Outcomes  Short:  Stephanie Short will meet with the dietician to address her weight loss goal.  She will also exercise to decrease her anxiety and improve her mood and ability to breathe better.  Long:  Stephanie Short will exercise consistently to improve her health and mental health    Continue Psychosocial Services    Follow up required by counselor       Psychosocial Re-Evaluation: Psychosocial Re-Evaluation    [place] Name 11/04/17 1606 11/04/17 1710 12/02/17 1211         Psychosocial Re-Evaluation   Current issues with  Current Stress Concerns;History of Depression;Current Depression  -  Current Stress Concerns;History of Depression;Current Depression     Comments  Counselor met with Stephanie Short and her spouse today for follow up.  She reports her mood and quality of life seem to both be getting worse.  Her spouse reports noticing less "spunk" and less energy in his wife.  She states her sleep has improved a little with the new CPAP over the past month and she may be getting approximately 6 hours of sleep now.  She has a history of grief/loss with a son that died tragically in an accident in 2013.  She went to counseling briefly immediately after this occurred but felt she "was not ready." and she also has takens some medication for anxiety - but not consistently so not very effective.  Counselor recommended Stephanie Short find another therapist to help her through this difficult time.  Also, counselor provided contact info on potential therapists and grief support groups for her.  She is encouraged to speak with her Dr. about a medication evaluation for her depressive symptoms as well in the near future.  Stephanie Short and her husband agreed to these recommendations.  Counselor will follow.    -  Reviewed patient health questionnaire (PHQ-9) with patient for follow up. Previously, patients score indicated  signs/symptoms of depression.  Reviewed to see if patient is improving symptom wise while in program.  Score improved and patient states that it is because she has family in town. She has been going out more and it feels good for her to get out of the house.     Expected Outcomes  -  Short:  Stephanie Short will contact a counselor and research the support group to help her during this difficult time emotionally.  She will contact her Dr. about a  medication evaluation once she establishes a baseline with a counselor.  Long:  Stephanie Short will continue to practice positive self-care for her health and mental health.    Short: Continue to attend [facility] regularly for regular exercise and social engagement. Long: Continue to improve symptoms and manage a positive mental state     Interventions  -  -  Stress management education;Encouraged to attend Pulmonary Rehabilitation for the exercise     Continue Psychosocial Services   Follow up required by counselor  -  Follow up required by staff        Psychosocial Discharge (Final Psychosocial Re-Evaluation): Psychosocial Re-Evaluation - 12/02/17 1211      Psychosocial Re-Evaluation   Current issues with  Current Stress Concerns;History of Depression;Current Depression    Comments  Reviewed patient health questionnaire (PHQ-9) with patient for follow up. Previously, patients score indicated signs/symptoms of depression.  Reviewed to see if patient is improving symptom wise while in program.  Score improved and patient states that it is because she has family in town. She has been going out more and it feels good for her to get out of the house.    Expected Outcomes  Short: Continue to attend [facility] regularly for regular exercise and social engagement. Long: Continue to improve symptoms and manage a positive mental state    Interventions  Stress management education;Encouraged to attend Pulmonary Rehabilitation for the exercise    Continue Psychosocial Services   Follow up required by staff       Education: Education Goals: Education classes will be provided on a weekly basis, covering required topics. Participant will state understanding/return demonstration of topics presented.  Learning Barriers/Preferences: Learning Barriers/Preferences - 10/20/17 1138      Learning Barriers/Preferences   Learning Barriers  None    Learning Preferences  None       Education Topics:  Initial Evaluation  Education: - Verbal, written and demonstration of respiratory meds, oximetry and breathing techniques. Instruction on use of nebulizers and MDIs and importance of monitoring MDI activations.   Pulmonary Rehab from 12/02/2017 in [facility] Cardiac and Pulmonary Rehab  Date  10/20/17  Educator  [facility]  Instruction Review Code  1- Verbalizes Understanding      General Nutrition Guidelines/Fats and Fiber: -Group instruction provided by verbal, written material, models and posters to present the general guidelines for heart healthy nutrition. Gives an explanation and review of dietary fats and fiber.   Pulmonary Rehab from 12/02/2017 in [facility] Cardiac and Pulmonary Rehab  Date  11/25/17  Educator  [facility]  Instruction Review Code  1- Verbalizes Understanding      Controlling Sodium/Reading Food Labels: -Group verbal and written material supporting the discussion of sodium use in heart healthy nutrition. Review and explanation with models, verbal and written materials for utilization of the food label.   Exercise Physiology & General Exercise Guidelines: - Group verbal and written instruction with models to review the exercise physiology of the cardiovascular system and associated critical values.  Provides general exercise guidelines with specific guidelines to those with heart or lung disease.    Aerobic Exercise & Resistance Training: - Gives group verbal and written instruction on the various components of exercise. Focuses on aerobic and resistive training programs and the benefits of this training and how to safely progress through these programs.   Pulmonary Rehab from 12/02/2017 in [facility] Cardiac and Pulmonary Rehab  Date  10/28/17  Educator  [facility]  Instruction Review Code  1- Verbalizes Understanding      Flexibility, Balance, Mind/Body Relaxation: Provides group verbal/written instruction on the benefits of flexibility and balance training, including mind/body exercise modes such as yoga, pilates  and tai chi.  Demonstration and skill practice provided.   Stress and Anxiety: - Provides group verbal and written instruction about the health risks of elevated stress and causes of high stress.  Discuss the correlation between heart/lung disease and anxiety and treatment options. Review healthy ways to manage with stress and anxiety.   Pulmonary Rehab from 12/02/2017 in [facility] Cardiac and Pulmonary Rehab  Date  12/02/17  Educator  [facility]  Instruction Review Code  1- Verbalizes Understanding      Depression: - Provides group verbal and written instruction on the correlation between heart/lung disease and depressed mood, treatment options, and the stigmas associated with seeking treatment.   Pulmonary Rehab from 12/02/2017 in [facility] Cardiac and Pulmonary Rehab  Date  11/04/17  Educator  [facility]  Instruction Review Code  1- Verbalizes Understanding      Exercise & Equipment Safety: - Individual verbal instruction and demonstration of equipment use and safety with use of the equipment.   Pulmonary Rehab from 12/02/2017 in [facility] Cardiac and Pulmonary Rehab  Date  10/20/17  Educator  [facility]  Instruction Review Code  1- Verbalizes Understanding      Infection Prevention: - Provides verbal and written material to individual with discussion of infection control including proper hand washing and proper equipment cleaning during exercise session.   Pulmonary Rehab from 12/02/2017 in [facility] Cardiac and Pulmonary Rehab  Date  10/20/17  Educator  [facility]  Instruction Review Code  1- Verbalizes Understanding      Falls Prevention: - Provides verbal and written material to individual with discussion of falls prevention and safety.   Pulmonary Rehab from 12/02/2017 in [facility] Cardiac and Pulmonary Rehab  Date  10/20/17  Educator  [facility]  Instruction Review Code  1- Verbalizes Understanding      Diabetes: - Individual verbal and written instruction to review signs/symptoms of diabetes, desired ranges of glucose  level fasting, after meals and with exercise. Advice that pre and post exercise glucose checks will be done for 3 sessions at entry of program.   Chronic Lung Diseases: - Group verbal and written instruction to review updates, respiratory medications, advancements in procedures and treatments. Discuss use of supplemental oxygen including available portable oxygen systems, continuous and intermittent flow rates, concentrators, personal use and safety guidelines. Review proper use of inhaler and spacers. Provide informative websites for self-education.    Energy Conservation: - Provide group verbal and written instruction for methods to conserve energy, plan and organize activities. Instruct on pacing techniques, use of adaptive equipment and posture/positioning to relieve shortness of breath.   Pulmonary Rehab from 12/02/2017 in [facility] Cardiac and Pulmonary Rehab  Date  11/18/17  Educator  [facility]  Instruction Review Code  1- Verbalizes Understanding      Triggers and Exacerbations: - Group verbal and written instruction to review types of  environmental triggers and ways to prevent exacerbations. Discuss weather changes, air quality and the benefits of nasal washing. Review warning signs and symptoms to help prevent infections. Discuss techniques for effective airway clearance, coughing, and vibrations.   AED/CPR: - Group verbal and written instruction with the use of models to demonstrate the basic use of the AED with the basic ABC's of resuscitation.   Pulmonary Rehab from 12/02/2017 in [facility] Cardiac and Pulmonary Rehab  Date  11/27/17  Educator  [facility]  Instruction Review Code  1- Actuary and Physiology of the Lungs: - Group verbal and written instruction with the use of models to provide basic lung anatomy and physiology related to function, structure and complications of lung disease.   Anatomy & Physiology of the Heart: - Group verbal and written instruction  and models provide basic cardiac anatomy and physiology, with the coronary electrical and arterial systems. Review of Valvular disease and Heart Failure   Pulmonary Rehab from 12/02/2017 in [facility] Cardiac and Pulmonary Rehab  Date  10/30/17  Educator  [facility]  Instruction Review Code  1- Verbalizes Understanding      Cardiac Medications: - Group verbal and written instruction to review commonly prescribed medications for heart disease. Reviews the medication, class of the drug, and side effects.   Pulmonary Rehab from 12/02/2017 in [facility] Cardiac and Pulmonary Rehab  Date  11/13/17  Educator  [facility]  Instruction Review Code  1- Verbalizes Understanding      Know Your Numbers and Risk Factors: -Group verbal and written instruction about important numbers in your health.  Discussion of what are risk factors and how they play a role in the disease process.  Review of Cholesterol, Blood Pressure, Diabetes, and BMI and the role they play in your overall health.   Sleep Hygiene: -Provides group verbal and written instruction about how sleep can affect your health.  Define sleep hygiene, discuss sleep cycles and impact of sleep habits. Review good sleep hygiene tips.    Other: -Provides group and verbal instruction on various topics (see comments)    Knowledge Questionnaire Score: Knowledge Questionnaire Score - 10/20/17 1138      Knowledge Questionnaire Score   Pre Score  14/18   reviewed with patient        Core Components/Risk Factors/Patient Goals at Admission: Personal Goals and Risk Factors at Admission - 10/20/17 1144      Core Components/Risk Factors/Patient Goals on Admission    Weight Management  Yes;Weight Loss    Intervention  Weight Management: Develop a combined nutrition and exercise program designed to reach desired caloric intake, while maintaining appropriate intake of nutrient and fiber, sodium and fats, and appropriate energy expenditure required for the weight  goal.;Weight Management: Provide education and appropriate resources to help participant work on and attain dietary goals.;Weight Management/Obesity: Establish reasonable short term and long term weight goals.    Admit Weight  198 lb 11.2 oz (90.1 kg)    Goal Weight: Short Term  193 lb (87.5 kg)    Goal Weight: Long Term  150 lb (68 kg)    Expected Outcomes  Short Term: Continue to assess and modify interventions until short term weight is achieved;Long Term: Adherence to nutrition and physical activity/exercise program aimed toward attainment of established weight goal;Weight Loss: Understanding of general recommendations for a balanced deficit meal plan, which promotes 1-2 lb weight loss per week and includes a negative energy balance of [phone] kcal/d;Understanding recommendations for meals  to include 15-35% energy as protein, 25-35% energy from fat, 35-60% energy from carbohydrates, less than 238m of dietary cholesterol, 20-35 gm of total fiber daily;Understanding of distribution of calorie intake throughout the day with the consumption of 4-5 meals/snacks    Improve shortness of breath with ADL's  Yes    Intervention  Provide education, individualized exercise plan and daily activity instruction to help decrease symptoms of SOB with activities of daily living.    Expected Outcomes  Short Term: Improve cardiorespiratory fitness to achieve a reduction of symptoms when performing ADLs;Long Term: Be able to perform more ADLs without symptoms or delay the onset of symptoms    Diabetes  Yes    Intervention  Provide education about proper nutrition, including hydration, and aerobic/resistive exercise prescription along with prescribed medications to achieve blood glucose in normal ranges: Fasting glucose 65-99 mg/dL;Provide education about signs/symptoms and action to take for hypo/hyperglycemia.    Expected Outcomes  Long Term: Attainment of HbA1C < 7%.;Short Term: Participant verbalizes understanding of  the signs/symptoms and immediate care of hyper/hypoglycemia, proper foot care and importance of medication, aerobic/resistive exercise and nutrition plan for blood glucose control.    Hypertension  Yes    Intervention  Provide education on lifestyle modifcations including regular physical activity/exercise, weight management, moderate sodium restriction and increased consumption of fresh fruit, vegetables, and low fat dairy, alcohol moderation, and smoking cessation.;Monitor prescription use compliance.    Expected Outcomes  Short Term: Continued assessment and intervention until BP is < 140/972mHG in hypertensive participants. < 130/8061mG in hypertensive participants with diabetes, heart failure or chronic kidney disease.;Long Term: Maintenance of blood pressure at goal levels.       Core Components/Risk Factors/Patient Goals Review:  Goals and Risk Factor Review    Row Name 12/02/17 1221             Core Components/Risk Factors/Patient Goals Review   Personal Goals Review  Weight Management/Obesity;Improve shortness of breath with ADL's;Hypertension;Diabetes       Review  Patient has been checking her blood sugar at home and has not been over 160. She has been checking her blood pressure at home and states it has been good. Her ADL's have not been getting any easier for her. She may be eligable for a lung program to have other treatment options.  She is going in for a heart cath next Wednesday [place] of the results when she is back.          Core Components/Risk Factors/Patient Goals at Discharge (Final Review):  Goals and Risk Factor Review - 12/02/17 1221      Core Components/Risk Factors/Patient Goals Review   Personal Goals Review  Weight Management/Obesity;Improve shortness of breath with ADL's;Hypertension;Diabetes    Review  Patient has been checking her blood sugar at home and has not been over 160. She has been checking her blood  pressure at home and states it has been good. Her ADL's have not been getting any easier for her. She may be eligable for a lung program to have other treatment options.  She is going in for a heart cath next Wednesday and [place] let us [place] of the results when she is back.       ITP Comments: ITP Comments    Row Name 10/20/17 1108 10/23/17 1151 11/09/17 0901 11/18/17 1420 12/07/17 0845   ITP Comments  Medical Evaluation completed. Chart sent  for review and changes to Dr. Emily Filbert Director of [place]. Diagnosis can be found in [facility] encounter  First full day of exercise!  Patient was oriented to gym and equipment including functions, settings, policies, and procedures.  Patient's individual exercise prescription and treatment plan were reviewed.  All starting workloads were established based on the results of the 6 minute walk test done at initial orientation visit.  The plan for exercise progression was also introduced and progression will be customized based on patient's performance and goals  30 day review completed. ITP sent to Dr. Emily Filbert Director of [place]. Continue with ITP unless changes are made by physician.  Stephanie Short went to see Dr. [name] yesterday and was okayed to increase to 4-5L for oxygen when exerting, but she needs 6L to maintain saturations above 88%. We will send another note if this does not post in progress notes from office visit.  We also talked about using her e-cylinders for home exercise versus her portable concentrator as it does not give her enough flow.   30 day review completed. ITP sent to Dr. Emily Filbert Director of [place]. Continue with ITP unless changes are made by physician.      Comments: 30 day review

## 2017-12-07 NOTE — Progress Notes (Signed)
Daily Session Note  Patient Details  Name: Stephanie Short MRN: [id number] Date of Birth: May 25, 1950 Referring Provider:     Pulmonary Rehab from 10/20/2017 in [facility] Cardiac and Pulmonary Rehab  Referring Provider  [name]      Encounter Date: 12/07/2017  Check In:      Social History   Tobacco Use  Smoking Status Former Smoker  . Packs/day: 1.00  . Years: 20.00  . Pack years: 20.00  . Types: Cigarettes  . Last attempt to quit: 01/21/2004  . Years since quitting: 13.8  Smokeless Tobacco Never Used    Goals Met:  Independence with exercise equipment Exercise tolerated well No report of cardiac concerns or symptoms Strength training completed today  Goals Unmet:  Not Applicable  Comments: Pt able to follow exercise prescription today without complaint.  Will continue to monitor for progression.    Dr. Emily Filbert is Medical Director for [place] and [facility].

## 2017-12-09 ENCOUNTER — Encounter: Payer: 59 | Admitting: *Deleted

## 2017-12-09 DIAGNOSIS — J449 Chronic obstructive pulmonary disease, unspecified: Secondary | ICD-10-CM | POA: Diagnosis not present

## 2017-12-09 NOTE — Progress Notes (Signed)
Daily Session Note  Patient Details  Name: Stephanie Short MRN: [id number] Date of Birth: [birth year] Referring Provider:     Pulmonary Rehab from 10/20/2017 in [facility] Cardiac and Pulmonary Rehab  Referring Provider  [name]      Encounter Date: 12/09/2017  Check In: Session Check In - 12/09/17 1121      Check-In   Supervising physician immediately available to respond to emergencies  [facility] immediately available ER MD    Physician(s)  Drs. Cinda Quest and [facility]    Location  [facility]-Cardiac & Pulmonary Rehab    Staff Present  Renita Papa, RN BSN;Shelsie Tijerino Luan Pulling, [place], RCEP, CCRP, Exercise Physiologist;Joseph Tessie Fass RCP,RRT,BSRT    Medication changes reported      No    Fall or balance concerns reported     No    Warm-up and Cool-down  Performed as group-led instruction    Resistance Training Performed  Yes    VAD Patient?  No    PAD/SET Patient?  No      Pain Assessment   Currently in Pain?  No/denies          Social History   Tobacco Use  Smoking Status Former Smoker  . Packs/day: 1.00  . Years: 20.00  . Pack years: 20.00  . Types: Cigarettes  . Last attempt to quit: 01/21/2004  . Years since quitting: 13.8  Smokeless Tobacco Never Used    Goals Met:  Proper associated with RPD/PD & O2 Sat Independence with exercise equipment Using PLB without cueing & demonstrates good technique Exercise tolerated well No report of cardiac concerns or symptoms Strength training completed today  Goals Unmet:  Not Applicable  Comments: Pt able to follow exercise prescription today without complaint.  Will continue to monitor for progression.    Dr. Emily Filbert is Medical Director for [place] and [facility].

## 2017-12-10 ENCOUNTER — Ambulatory Visit: Payer: 59 | Admitting: Nurse Practitioner

## 2017-12-11 DIAGNOSIS — I2729 Other secondary pulmonary hypertension: Secondary | ICD-10-CM | POA: Diagnosis not present

## 2017-12-11 DIAGNOSIS — I2721 Secondary pulmonary arterial hypertension: Secondary | ICD-10-CM | POA: Diagnosis not present

## 2017-12-11 DIAGNOSIS — E785 Hyperlipidemia, unspecified: Secondary | ICD-10-CM | POA: Diagnosis not present

## 2017-12-11 DIAGNOSIS — J9691 Respiratory failure, unspecified with hypoxia: Secondary | ICD-10-CM | POA: Diagnosis not present

## 2017-12-11 DIAGNOSIS — E119 Type 2 diabetes mellitus without complications: Secondary | ICD-10-CM | POA: Diagnosis not present

## 2017-12-11 DIAGNOSIS — J439 Emphysema, unspecified: Secondary | ICD-10-CM | POA: Diagnosis not present

## 2017-12-14 DIAGNOSIS — J449 Chronic obstructive pulmonary disease, unspecified: Secondary | ICD-10-CM

## 2017-12-14 NOTE — Progress Notes (Signed)
Daily Session Note  Patient Details  Name: Stephanie Short MRN: [id number] Date of Birth: 1950/01/27 Referring Provider:     Pulmonary Rehab from 10/20/2017 in [facility] Cardiac and Pulmonary Rehab  Referring Provider  [name]      Encounter Date: 12/14/2017  Check In: Session Check In - 12/14/17 1400      Check-In   Supervising physician immediately available to respond to emergencies  [facility] immediately available ER MD    Physician(s)  Dr. Joni Fears and Jimmye Norman    Location  [facility]-Cardiac & Pulmonary Rehab    Staff Present  Justin Mend RCP,RRT,BSRT;Amanda Oletta Darter, [place], ACSM CEP, Exercise Physiologist;Kelly Amedeo Plenty, BS, ACSM CEP, Exercise Physiologist    Medication changes reported      No    Fall or balance concerns reported     No    Warm-up and Cool-down  Performed as group-led instruction    Resistance Training Performed  Yes    VAD Patient?  No    PAD/SET Patient?  No      Pain Assessment   Currently in Pain?  No/denies          Social History   Tobacco Use  Smoking Status Former Smoker  . Packs/day: 1.00  . Years: 20.00  . Pack years: 20.00  . Types: Cigarettes  . Last attempt to quit: 01/21/2004  . Years since quitting: 13.9  Smokeless Tobacco Never Used    Goals Met:  Independence with exercise equipment Exercise tolerated well No report of cardiac concerns or symptoms Strength training completed today  Goals Unmet:  Not Applicable  Comments: Pt able to follow exercise prescription today without complaint.  Will continue to monitor for progression.    Dr. Emily Filbert is Medical Director for [place] [facility].

## 2017-12-21 ENCOUNTER — Encounter: Payer: 59 | Attending: Specialist | Admitting: *Deleted

## 2017-12-21 DIAGNOSIS — Z87891 Personal history of nicotine dependence: Secondary | ICD-10-CM | POA: Diagnosis not present

## 2017-12-21 DIAGNOSIS — Z79899 Other long term (current) drug therapy: Secondary | ICD-10-CM | POA: Diagnosis not present

## 2017-12-21 DIAGNOSIS — Z7984 Long term (current) use of oral hypoglycemic drugs: Secondary | ICD-10-CM | POA: Insufficient documentation

## 2017-12-21 DIAGNOSIS — Z7982 Long term (current) use of aspirin: Secondary | ICD-10-CM | POA: Insufficient documentation

## 2017-12-21 DIAGNOSIS — I110 Hypertensive heart disease with heart failure: Secondary | ICD-10-CM | POA: Diagnosis not present

## 2017-12-21 DIAGNOSIS — J449 Chronic obstructive pulmonary disease, unspecified: Secondary | ICD-10-CM | POA: Diagnosis not present

## 2017-12-21 DIAGNOSIS — E119 Type 2 diabetes mellitus without complications: Secondary | ICD-10-CM | POA: Diagnosis not present

## 2017-12-21 DIAGNOSIS — Z9981 Dependence on supplemental oxygen: Secondary | ICD-10-CM | POA: Diagnosis not present

## 2017-12-21 DIAGNOSIS — G4700 Insomnia, unspecified: Secondary | ICD-10-CM | POA: Diagnosis not present

## 2017-12-21 DIAGNOSIS — I509 Heart failure, unspecified: Secondary | ICD-10-CM | POA: Insufficient documentation

## 2017-12-21 DIAGNOSIS — F329 Major depressive disorder, single episode, unspecified: Secondary | ICD-10-CM | POA: Diagnosis not present

## 2017-12-21 NOTE — Progress Notes (Signed)
Daily Session Note  Patient Details  Name: Stephanie Short MRN: [id number] Date of Birth: 06/20/1950 Referring Provider:     Pulmonary Rehab from 10/20/2017 in [facility] Cardiac and Pulmonary Rehab  Referring Provider  [name]      Encounter Date: 12/21/2017  Check In: Session Check In - 12/21/17 1218      Check-In   Supervising physician immediately available to respond to emergencies  [facility] immediately available ER MD    Physician(s)  Dr. Jimmye Norman and Joni Fears    Location  [facility]-Cardiac & Pulmonary Rehab    Staff Present  Earlean Shawl, BS, ACSM CEP, Exercise Physiologist;Joseph [facility], [place], ACSM CEP, Exercise Physiologist    Medication changes reported      No    Fall or balance concerns reported     No    Tobacco Cessation  No Change    Warm-up and Cool-down  Performed as group-led instruction    Resistance Training Performed  Yes    VAD Patient?  No    PAD/SET Patient?  No      Pain Assessment   Currently in Pain?  No/denies    Multiple Pain Sites  No          Social History   Tobacco Use  Smoking Status Former Smoker  . Packs/day: 1.00  . Years: 20.00  . Pack years: 20.00  . Types: Cigarettes  . Last attempt to quit: 01/21/2004  . Years since quitting: 13.9  Smokeless Tobacco Never Used    Goals Met:  Proper associated with RPD/PD & O2 Sat Independence with exercise equipment Exercise tolerated well No report of cardiac concerns or symptoms Strength training completed today  Goals Unmet:  Not Applicable  Comments: Pt able to follow exercise prescription today without complaint.  Will continue to monitor for progression.    Dr. Emily Filbert is Medical Director for [place] and [facility].

## 2017-12-23 ENCOUNTER — Encounter: Payer: 59 | Admitting: *Deleted

## 2017-12-23 DIAGNOSIS — J449 Chronic obstructive pulmonary disease, unspecified: Secondary | ICD-10-CM | POA: Diagnosis not present

## 2017-12-23 NOTE — Progress Notes (Signed)
Daily Session Note  Patient Details  Name: Stephanie Short MRN: [id number] Date of Birth: 01/19/1951 Referring Provider:     Pulmonary Rehab from 10/20/2017 in [facility] Cardiac and Pulmonary Rehab  Referring Provider  [name]      Encounter Date: 12/23/2017  Check In: Session Check In - 12/23/17 1139      Check-In   Supervising physician immediately available to respond to emergencies  [facility] immediately available ER MD    Physician(s)  Dr. Quentin Cornwall and Jimmye Norman    Location  [facility]-Cardiac & Pulmonary Rehab    Staff Present  Renita Papa, RN BSN;Jessica Luan Pulling, MA, RCEP, CCRP, Exercise Physiologist;Joseph Tessie Fass RCP,RRT,BSRT    Medication changes reported      No    Fall or balance concerns reported     No    Tobacco Cessation  No Change    Warm-up and Cool-down  Performed as group-led instruction    Resistance Training Performed  Yes    VAD Patient?  No    PAD/SET Patient?  No      Pain Assessment   Currently in Pain?  No/denies          Social History   Tobacco Use  Smoking Status Former Smoker  . Packs/day: 1.00  . Years: 20.00  . Pack years: 20.00  . Types: Cigarettes  . Last attempt to quit: 01/21/2004  . Years since quitting: 13.9  Smokeless Tobacco Never Used    Goals Met:  Proper associated with RPD/PD & O2 Sat Independence with exercise equipment Using PLB without cueing & demonstrates good technique Exercise tolerated well No report of cardiac concerns or symptoms Strength training completed today  Goals Unmet:  Not Applicable  Comments: Pt able to follow exercise prescription today without complaint.  Will continue to monitor for progression.    Dr. Emily Filbert is Medical Director for [place] and [facility].

## 2017-12-25 DIAGNOSIS — J449 Chronic obstructive pulmonary disease, unspecified: Secondary | ICD-10-CM | POA: Diagnosis not present

## 2017-12-25 NOTE — Progress Notes (Signed)
Daily Session Note  Patient Details  Name: Stephanie Short MRN: [id number] Date of Birth: 11-02-1950 Referring Provider:     Pulmonary Rehab from 10/20/2017 in [facility] Cardiac and Pulmonary Rehab  Referring Provider  [name]      Encounter Date: 12/25/2017  Check In: Session Check In - 12/25/17 1143      Check-In   Supervising physician immediately available to respond to emergencies  [facility] immediately available ER MD    Physician(s)  Dr. Joni Fears and [facility]    Location  [facility]-Cardiac & Pulmonary Rehab    Staff Present  Justin Mend Lorre Nick, [place], RCEP, CCRP, Exercise Physiologist;Meredith Sherryll Burger, RN BSN    Medication changes reported      No    Fall or balance concerns reported     No    Warm-up and Cool-down  Performed as group-led Higher education careers adviser Performed  Yes    VAD Patient?  No    PAD/SET Patient?  No      Pain Assessment   Currently in Pain?  No/denies          Social History   Tobacco Use  Smoking Status Former Smoker  . Packs/day: 1.00  . Years: 20.00  . Pack years: 20.00  . Types: Cigarettes  . Last attempt to quit: 01/21/2004  . Years since quitting: 13.9  Smokeless Tobacco Never Used    Goals Met:  Independence with exercise equipment Exercise tolerated well No report of cardiac concerns or symptoms Strength training completed today  Goals Unmet:  Not Applicable  Comments: Pt able to follow exercise prescription today without complaint.  Will continue to monitor for progression.    Dr. Emily Filbert is Medical Director for [place] and [facility].

## 2017-12-28 ENCOUNTER — Encounter: Payer: 59 | Admitting: *Deleted

## 2017-12-28 DIAGNOSIS — J449 Chronic obstructive pulmonary disease, unspecified: Secondary | ICD-10-CM | POA: Diagnosis not present

## 2017-12-28 NOTE — Progress Notes (Signed)
Daily Session Note  Patient Details  Name: Stephanie Short MRN: [id number] Date of Birth: 1950-04-02 Referring Provider:     Pulmonary Rehab from 10/20/2017 in [facility] Cardiac and Pulmonary Rehab  Referring Provider  [name]      Encounter Date: 12/28/2017  Check In: Session Check In - 12/28/17 1217      Check-In   Supervising physician immediately available to respond to emergencies  [facility] immediately available ER MD    Physician(s)  Dr. Kerman Passey and Dr. Quentin Cornwall    Location  [facility]-Cardiac & Pulmonary Rehab    Staff Present  Earlean Shawl, BS, ACSM CEP, Exercise Physiologist;Joseph Clayborn [place], [place], RRT, Respiratory Therapist    Medication changes reported      No    Fall or balance concerns reported     No    Tobacco Cessation  No Change    Warm-up and Cool-down  Performed as group-led instruction    Resistance Training Performed  Yes    VAD Patient?  No    PAD/SET Patient?  No      Pain Assessment   Currently in Pain?  No/denies    Multiple Pain Sites  No          Social History   Tobacco Use  Smoking Status Former Smoker  . Packs/day: 1.00  . Years: 20.00  . Pack years: 20.00  . Types: Cigarettes  . Last attempt to quit: 01/21/2004  . Years since quitting: 13.9  Smokeless Tobacco Never Used    Goals Met:  Proper associated with RPD/PD & O2 Sat Independence with exercise equipment Exercise tolerated well No report of cardiac concerns or symptoms Strength training completed today  Goals Unmet:  Not Applicable  Comments: Pt able to follow exercise prescription today without complaint.  Will continue to monitor for progression.    Dr. Emily Filbert is Medical Director for [place] and [facility].

## 2017-12-30 DIAGNOSIS — I2723 Pulmonary hypertension due to lung diseases and hypoxia: Secondary | ICD-10-CM | POA: Diagnosis not present

## 2017-12-30 DIAGNOSIS — R0609 Other forms of dyspnea: Secondary | ICD-10-CM | POA: Diagnosis not present

## 2017-12-30 DIAGNOSIS — J849 Interstitial pulmonary disease, unspecified: Secondary | ICD-10-CM | POA: Diagnosis not present

## 2017-12-30 DIAGNOSIS — J432 Centrilobular emphysema: Secondary | ICD-10-CM | POA: Diagnosis not present

## 2017-12-30 DIAGNOSIS — G4733 Obstructive sleep apnea (adult) (pediatric): Secondary | ICD-10-CM | POA: Diagnosis not present

## 2018-01-01 ENCOUNTER — Encounter: Payer: 59 | Admitting: *Deleted

## 2018-01-01 DIAGNOSIS — J449 Chronic obstructive pulmonary disease, unspecified: Secondary | ICD-10-CM | POA: Diagnosis not present

## 2018-01-01 NOTE — Progress Notes (Signed)
Daily Session Note  Patient Details  Name: Stephanie Short MRN: [id number] Date of Birth: 11/27/1950 Referring Provider:     Pulmonary Rehab from 10/20/2017 in [facility] Cardiac and Pulmonary Rehab  Referring Provider  [name]      Encounter Date: 01/01/2018  Check In: Session Check In - 01/01/18 1147      Check-In   Supervising physician immediately available to respond to emergencies  [facility] immediately available ER MD    Physician(s)  Dr. Cherylann Banas and Burlene Arnt    Location  [facility]-Cardiac & Pulmonary Rehab    Staff Present  Alberteen Sam, MA, RCEP, CCRP, Exercise Physiologist;Joseph Alcus Dad, RN BSN    Medication changes reported      No    Fall or balance concerns reported     No    Tobacco Cessation  No Change    Warm-up and Cool-down  Performed as group-led Higher education careers adviser Performed  Yes    VAD Patient?  No    PAD/SET Patient?  No      Pain Assessment   Currently in Pain?  No/denies          Social History   Tobacco Use  Smoking Status Former Smoker  . Packs/day: 1.00  . Years: 20.00  . Pack years: 20.00  . Types: Cigarettes  . Last attempt to quit: 01/21/2004  . Years since quitting: 13.9  Smokeless Tobacco Never Used    Goals Met:  Proper associated with RPD/PD & O2 Sat Independence with exercise equipment Using PLB without cueing & demonstrates good technique Exercise tolerated well Personal goals reviewed No report of cardiac concerns or symptoms Strength training completed today  Goals Unmet:  Not Applicable  Comments: Pt able to follow exercise prescription today without complaint.  Will continue to monitor for progression.    Dr. Emily Filbert is Medical Director for [place] and [facility].

## 2018-01-04 DIAGNOSIS — J449 Chronic obstructive pulmonary disease, unspecified: Secondary | ICD-10-CM | POA: Diagnosis not present

## 2018-01-04 NOTE — Progress Notes (Signed)
Pulmonary Individual Treatment Plan  Patient Details  Name: Stephanie Short MRN: [id number] Date of Birth: [birth year] Referring Provider:     Pulmonary Rehab from 10/20/2017 in [facility] Cardiac and Pulmonary Rehab  Referring Provider  [name]      Initial Encounter Date:    Pulmonary Rehab from 10/20/2017 in [facility] Cardiac and Pulmonary Rehab  Date  10/20/17      Visit Diagnosis: Chronic obstructive pulmonary disease, unspecified COPD type ([place])  Patient's Home Medications on Admission:  Current Outpatient Medications:  .  acetaminophen (TYLENOL) 325 MG tablet, Take 2 tablets (650 mg total) by mouth every 6 (six) hours as needed for mild pain or headache. (Patient taking differently: Take 500 mg by mouth every 6 (six) hours as needed for mild pain or headache. ), Disp: 30 tablet, Rfl: 0 .  albuterol (PROVENTIL HFA;VENTOLIN HFA) 108 (90 Base) MCG/ACT inhaler, Inhale 2 puffs into the lungs every 6 (six) hours as needed for wheezing or shortness of breath., Disp: , Rfl:  .  aspirin EC 81 MG tablet, Take 81 mg by mouth at bedtime., Disp: , Rfl:  .  Blood Glucose Monitoring Suppl (FIFTY50 GLUCOSE METER 2.0) w/Device KIT, Use as directed. Dx E11.22 ONE TOUCH, Disp: , Rfl:  .  carvedilol (COREG) 6.25 MG tablet, Take 6.25 mg by mouth 2 (two) times daily with a meal., Disp: , Rfl:  .  felodipine (PLENDIL) 5 MG 24 hr tablet, Take 1 tablet (5 mg total) by mouth every morning. (Patient taking differently: Take 10 mg by mouth daily. ), Disp: 30 tablet, Rfl: 2 .  furosemide (LASIX) 40 MG tablet, Take 1 tablet (40 mg total) by mouth daily., Disp: 30 tablet, Rfl: 2 .  glimepiride (AMARYL) 2 MG tablet, Take 2 mg by mouth daily with breakfast., Disp: , Rfl:  .  ipratropium-albuterol (DUONEB) 0.5-2.5 (3) MG/3ML SOLN, Take 3 mLs by nebulization every 6 (six) hours as needed (wheezing, shortness of breath). (Patient taking differently: Take 3 mLs by nebulization 3 (three) times daily. ), Disp: 360 mL, Rfl: 1 .   ondansetron (ZOFRAN ODT) 4 MG disintegrating tablet, Allow 1-2 tablets to dissolve in your mouth every 8 hours as needed for nausea/vomiting, Disp: 30 tablet, Rfl: 0 .  ONE TOUCH ULTRA TEST test strip, USE TO CHECK BLOOD SUGAR 1-2 TIMES DAILY AS DIRECTED. DX E11.22., Disp: , Rfl: 5 .  potassium chloride 20 MEQ TBCR, Take 20 mEq by mouth daily. While taking lasix, Disp: 30 tablet, Rfl: 2 .  sitaGLIPtin (JANUVIA) 100 MG tablet, Take 100 mg by mouth daily., Disp: , Rfl:  .  umeclidinium-vilanterol (ANORO ELLIPTA) 62.5-25 MCG/INH AEPB, Inhale 1 puff into the lungs daily. , Disp: , Rfl:  .  vitamin B-12 (CYANOCOBALAMIN) 1000 MCG tablet, Take 1 tablet (1,000 mcg total) by mouth daily., Disp: 30 tablet, Rfl: 1  Past Medical History: Past Medical History:  Diagnosis Date  . Asthma   . CHF (congestive heart failure) ([place])   . Depression   . Diabetes mellitus   . Hepatitis C    Dr. 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 Hypertension   . Insomnia   . Oxygen dependent     Tobacco Use: Social History   Tobacco Use  Smoking Status Former Smoker  . Packs/day: 1.00  . Years: 20.00  . Pack years: 20.00  . Types: Cigarettes  . Last attempt to quit: 01/21/2004  . Years since quitting: 13.9  Smokeless Tobacco Never Used  Labs: Recent Review Flowsheet Data    Labs for ITP Cardiac and Pulmonary Rehab Latest Ref Rng & Units 02-04-202009 02/06/2009 02/07/2009 05/24/2012 08/19/2017   Cholestrol 0 - 200 mg/dL - - 104 ATP III CLASSIFICATION: <200     mg/dL   Desirable 200-239  mg/dL   Borderline High >=240    mg/dL   High 82 -   LDLCALC 0 - 100 mg/dL - - 48 Total Cholesterol/HDL:CHD Risk Coronary Heart Disease Risk Table Men   Women 1/2 Average Risk   3.4   3.3 Average Risk       5.0   4.4 2 X Average Risk   9.6   7.1 3 X Average Risk  23.4   11.0 Use the calculated Patient Ratio above and the CHD Risk Table to determine the patient's CHD Risk. ATP III CLASSIFICATION (LDL): <100     mg/dL    Optimal 100-129  mg/dL   Near or Above Optimal 130-159  mg/dL   Borderline 160-189  mg/dL   High >190     mg/dL   Very High 38 -   HDL 40 - 60 mg/dL - - 45 31(L) -   Trlycerides 0 - 200 mg/dL - - 56 67 -   Hemoglobin A1c 4.8 - 5.6 % - 7.4 (NOTE) The ADA recommends the following therapeutic goal for glycemic control related to Hgb A1c measurement: Goal of therapy: <6.5 Hgb A1c  Reference: American Diabetes Association: Clinical Practice Recommendations 2010, Diabetes Care, 2010, 33: (Suppl 1).(H) - - 7.7(H)   HCO3 - 30.6(H) - - - -   TCO2 - 32 - - - -       Pulmonary Assessment Scores: Pulmonary Assessment Scores    Row Name 10/20/17 1135         ADL UCSD   ADL Phase  Entry     SOB Score total  67     Rest  0     Walk  4     Stairs  5     Bath  4     Dress  5     Shop  3       CAT Score   CAT Score  19       mMRC Score   mMRC Score  3        Pulmonary Function Assessment: Pulmonary Function Assessment - 10/20/17 1134      Initial Spirometry Results   FVC%  48 %    FEV1%  55 %    FEV1/FVC Ratio  89.17    Comments  good patient effort      Post Bronchodilator Spirometry Results   FVC%  46.08 %    FEV1%  54.45 %    FEV1/FVC Ratio  92.09    Comments  good patient effort      Breath   Bilateral Breath Sounds  Clear    Shortness of Breath  Yes;Limiting activity;Panic with Shortness of Breath       Exercise Target Goals: Exercise Program Goal: Individual exercise prescription set using results from initial 6 min walk test and THRR while considering  patient's activity barriers and safety.   Exercise Prescription Goal: Initial exercise prescription builds to 30-45 minutes a day of aerobic activity, 2-3 days per week.  Home exercise guidelines will be given to patient during program as part of exercise prescription that the participant will acknowledge.  Activity Barriers & Risk Stratification:   6 Minute Walk: 6 Minute  Walk    Row Name 10/20/17 1239          6 Minute Walk   Phase  Initial     Distance  300 feet     Walk Time  2.75 minutes     # of Rest Breaks  0     MPH  1.23     METS  1.2     RPE  11     Perceived Dyspnea   3     VO2 Peak  4.2     Symptoms  No     Resting HR  92 bpm     Resting BP  114/64     Resting Oxygen Saturation   91 %     Exercise Oxygen Saturation  during 6 min walk  78 %     Max Ex. HR  120 bpm     Max Ex. BP  142/64     2 Minute Post BP  114/60       Interval HR   1 Minute HR  110     2 Minute HR  117     3 Minute HR  120     Interval Heart Rate?  Yes       Interval Oxygen   Interval Oxygen?  Yes     Baseline Oxygen Saturation %  91 %     1 Minute Oxygen Saturation %  84 %     1 Minute Liters of Oxygen  3 L pulsed     2 Minute Oxygen Saturation %  80 %     2 Minute Liters of Oxygen  3 L pulsed     3 Minute Oxygen Saturation %  78 %     3 Minute Liters of Oxygen  3 L pulsed       Oxygen Initial Assessment: Oxygen Initial Assessment - 10/20/17 1133      Home Oxygen   Home Oxygen Device  Home Concentrator;Portable Concentrator;E-Tanks    Sleep Oxygen Prescription  CPAP    Liters per minute  2    Home Exercise Oxygen Prescription  Continuous    Liters per minute  2    Home at Rest Exercise Oxygen Prescription  Continuous    Liters per minute  2    Compliance with Home Oxygen Use  Yes      Initial 6 min Walk   Oxygen Used  Pulsed;Portable Concentrator    Liters per minute  2      Program Oxygen Prescription   Program Oxygen Prescription  Continuous;E-Tanks    Liters per minute  2      Intervention   Short Term Goals  To learn and exhibit compliance with exercise, home and travel O2 prescription;To learn and understand importance of monitoring SPO2 with pulse oximeter and demonstrate accurate use of the pulse oximeter.;To learn and understand importance of maintaining oxygen saturations>88%;To learn and demonstrate proper pursed lip breathing techniques or other breathing  techniques.;To learn and demonstrate proper use of respiratory medications    Long  Term Goals  Exhibits compliance with exercise, home and travel O2 prescription;Verbalizes importance of monitoring SPO2 with pulse oximeter and return demonstration;Maintenance of O2 saturations>88%;Exhibits proper breathing techniques, such as pursed lip breathing or other method taught during program session;Compliance with respiratory medication;Demonstrates proper use of MDI's       Oxygen Re-Evaluation: Oxygen Re-Evaluation    Row Name 10/23/17 1150 11/18/17 1356 11/18/17 1417 12/25/17 1209  Program Oxygen Prescription   Program Oxygen Prescription  -  Continuous;E-Tanks  Continuous;E-Tanks  Continuous;E-Tanks    Liters per minute  -  4  -  4    Comments  -  6 liters when walking  6 liters when walking  6 liters when walking      Home Oxygen   Home Oxygen Device  -  Home Concentrator;Portable Concentrator;E-Tanks  Home Concentrator;Portable Concentrator;E-Tanks  Home Concentrator;Portable Concentrator;E-Tanks    Sleep Oxygen Prescription  -  CPAP  CPAP  CPAP    Liters per minute  -  _0 Home Exercise Oxygen Prescription  -  Continuous  Continuous  Continuous    Liters per minute  -  _1 Home at Rest Exercise Oxygen Prescription  -  Continuous  Continuous  Continuous    Liters per minute  -  _2 Compliance with Home Oxygen Use  -  Yes  Yes  Yes      Goals/Expected Outcomes   Short Term Goals  -  To learn and exhibit compliance with exercise, home and travel O2 prescription;To learn and understand importance of monitoring SPO2 with pulse oximeter and demonstrate accurate use of the pulse oximeter.;To learn and understand importance of maintaining oxygen saturations>88%;To learn and demonstrate proper pursed lip breathing techniques or other breathing techniques.;To learn and demonstrate proper use of respiratory medications  To learn and understand importance of maintaining  oxygen saturations>88%;To learn and understand importance of monitoring SPO2 with pulse oximeter and demonstrate accurate use of the pulse oximeter.;To learn and exhibit compliance with exercise, home and travel O2 prescription  To learn and understand importance of maintaining oxygen saturations>88%;To learn and understand importance of monitoring SPO2 with pulse oximeter and demonstrate accurate use of the pulse oximeter.;To learn and exhibit compliance with exercise, home and travel O2 prescription    Long  Term Goals  -  Exhibits compliance with exercise, home and travel O2 prescription;Verbalizes importance of monitoring SPO2 with pulse oximeter and return demonstration;Maintenance of O2 saturations>88%;Exhibits proper breathing techniques, such as pursed lip breathing or other method taught during program session;Compliance with respiratory medication;Demonstrates proper use of MDI's  Exhibits compliance with exercise, home and travel O2 prescription;Verbalizes importance of monitoring SPO2 with pulse oximeter and return demonstration;Maintenance of O2 saturations>88%  Exhibits compliance with exercise, home and travel O2 prescription;Verbalizes importance of monitoring SPO2 with pulse oximeter and return demonstration;Maintenance of O2 saturations>88%    Comments  Reviewed PLB technique with pt.  Talked about how it work and it's important to maintaining his exercise saturations.    Practiced PLB with patient. Patient understands why PLB is important and to use it when she is short of breath. Stephanie Short states that her doctor wants to put her on a respiratory medication that she has to qualify for and if not she may be looking to get a lung transplant. At rest her oxygen is around 95 percent at home. She needs to be on 6 liters when she is walking. On exertion her oxygen gets in the mid to low 80's but come back to the low 90 range with rest. Reviewed that oxygen saturations should be 88 percent and above.  Patient has a pulse oximeter at home to check her oxygen.  Stephanie Short went to see Dr. [name] yesterday and was okayed to increase to 4-5L for oxygen when exerting, but she needs 6L to maintain  saturations above 88%. We will send another note if this does not post in progress notes from office visit.  We also talked about using her e-cylinders for home exercise versus her portable concentrator as it does not give her enough flow.   Diaphragmatic and PLB breathing explained and performed with patient. Patient has a better understanding of how to do these exercises to help with breathing performance and relaxation. Patient performed breathing techniques adequately and to practice further at home.    Goals/Expected Outcomes  Short: Become more profiecient at using PLB.   Long: Become independent at using PLB.  Short: use PLB with exertion. Long: use PLB on exertion proficiently and independently.  Short: Increase oxygen flow at home. Long: Continue to be compliant.   Short: practice PLB and diaphragmatic breathing at home. Long: Use PLB and diaphragmatic breathing independently post [employer].       Oxygen Discharge (Final Oxygen Re-Evaluation): Oxygen Re-Evaluation - 12/25/17 1209      Program Oxygen Prescription   Program Oxygen Prescription  Continuous;E-Tanks    Liters per minute  4    Comments  6 liters when walking      Home Oxygen   Home Oxygen Device  Home Concentrator;Portable Concentrator;E-Tanks    Sleep Oxygen Prescription  CPAP    Liters per minute  2    Home Exercise Oxygen Prescription  Continuous    Liters per minute  5    Home at Rest Exercise Oxygen Prescription  Continuous    Liters per minute  2    Compliance with Home Oxygen Use  Yes      Goals/Expected Outcomes   Short Term Goals  To learn and understand importance of maintaining oxygen saturations>88%;To learn and understand importance of monitoring SPO2 with pulse oximeter and demonstrate accurate use of the pulse oximeter.;To  learn and exhibit compliance with exercise, home and travel O2 prescription    Long  Term Goals  Exhibits compliance with exercise, home and travel O2 prescription;Verbalizes importance of monitoring SPO2 with pulse oximeter and return demonstration;Maintenance of O2 saturations>88%    Comments  Diaphragmatic and PLB breathing explained and performed with patient. Patient has a better understanding of how to do these exercises to help with breathing performance and relaxation. Patient performed breathing techniques adequately and to practice further at home.    Goals/Expected Outcomes  Short: practice PLB and diaphragmatic breathing at home. Long: Use PLB and diaphragmatic breathing independently post [employer].       Initial Exercise Prescription: Initial Exercise Prescription - 10/20/17 1200      Date of Initial Exercise RX and Referring Provider   Date  10/20/17    Referring Provider  [name]      Oxygen   Oxygen  Continuous    Liters  3      NuStep   Level  1    SPM  80    Minutes  15    METs  1.2      Biostep-RELP   Level  1    SPM  50    Minutes  15    METs  1      Track   Laps  10    Minutes  15    METs  1.3      Prescription Details   Frequency (times per week)  3    Duration  Progress to 45 minutes of aerobic exercise without signs/symptoms of physical distress      Intensity  THRR 40-80% of Max Heartrate  116-140    Ratings of Perceived Exertion  11-13    Perceived Dyspnea  0-4      Resistance Training   Training Prescription  Yes    Weight  2 lb    Reps  10-15       Perform Capillary Blood Glucose checks as needed.  Exercise Prescription Changes: Exercise Prescription Changes    Row Name 10/26/17 1600 11/11/17 1400 11/24/17 1500 11/25/17 1400 12/08/17 1400     Response to Exercise   Blood Pressure (Admit)  102/60  138/84  124/70  -  134/70   Blood Pressure (Exit)  120/70  124/82  128/72  -  112/68   Heart Rate (Admit)  98 bpm  97 bpm  109 bpm   -  112 bpm   Heart Rate (Exercise)  117 bpm  115 bpm  123 bpm  -  115 bpm   Heart Rate (Exit)  109 bpm  104 bpm  98 bpm  -  98 bpm   Oxygen Saturation (Admit)  90 %  93 %  89 %  -  83 %   Oxygen Saturation (Exercise)  87 %  87 %  87 %  -  82 %   Oxygen Saturation (Exit)  91 %  89 %  89 %  -  96 %   Rating of Perceived Exertion (Exercise)  _0 -  13   Perceived Dyspnea (Exercise)  _1 -  2   Symptoms  desaturations  desaturations  desaturations  -  desaturations   Comments  second full day of exercise  -  -  -  -   Duration  Progress to 45 minutes of aerobic exercise without signs/symptoms of physical distress  Progress to 45 minutes of aerobic exercise without signs/symptoms of physical distress  Progress to 45 minutes of aerobic exercise without signs/symptoms of physical distress continues to need rest breaks on the track  -  Progress to 45 minutes of aerobic exercise without signs/symptoms of physical distress continues to need rest breaks on the track   Intensity  THRR unchanged  THRR unchanged  THRR unchanged  -  THRR unchanged     Progression   Progression  Continue to progress workloads to maintain intensity without signs/symptoms of physical distress.  Continue to progress workloads to maintain intensity without signs/symptoms of physical distress.  Continue to progress workloads to maintain intensity without signs/symptoms of physical distress.  -  Continue to progress workloads to maintain intensity without signs/symptoms of physical distress.   Average METs  1.9  2.47  2.21  -  2.07     Resistance Training   Training Prescription  Yes  Yes  Yes  -  Yes   Weight  2 lbs  2 lbs  2 lbs  -  2 lbs   Reps  10-15  10-15  10-15  -  10-15     Interval Training   Interval Training  No  No  No  -  No     Oxygen   Oxygen  Continuous  Continuous  Continuous  -  Continuous   Liters  _2 -  6     NuStep   Level  _3 -  3   Minutes  _4 -  15   METs  2.2  3.8   2.8  -  2.7     Biostep-RELP   Level  _0 -  2   Minutes  _1 -  15   METs  _2 -  2     Track   Laps  _3 -  11   Minutes  _4 -  15   METs  1.6  1.6  1.84  -  1.5     Home Exercise Plan   Plans to continue exercise at  -  -  -  Home (comment) walking, gym up the street  Home (comment) walking, gym up the street   Frequency  -  -  -  Add 1 additional day to program exercise sessions.  Add 1 additional day to program exercise sessions.   Initial Home Exercises Provided  -  -  -  11/25/17  11/25/17   Row Name 12/22/17 1400             Response to Exercise   Blood Pressure (Admit)  122/70       Blood Pressure (Exit)  122/82       Heart Rate (Admit)  111 bpm       Heart Rate (Exercise)  117 bpm       Heart Rate (Exit)  96 bpm       Oxygen Saturation (Admit)  89 %       Oxygen Saturation (Exercise)  83 %       Oxygen Saturation (Exit)  92 %       Rating of Perceived Exertion (Exercise)  13       Perceived Dyspnea (Exercise)  2       Symptoms  desaturations       Duration  Progress to 45 minutes of aerobic exercise without signs/symptoms of physical distress continues to need rest breaks on the track       Intensity  THRR unchanged         Progression   Progression  Continue to progress workloads to maintain intensity without signs/symptoms of physical distress.       Average METs  2         Resistance Training   Training Prescription  Yes       Weight  2 lbs       Reps  10-15         Interval Training   Interval Training  No         Oxygen   Oxygen  Continuous       Liters  6         NuStep   Level  3       Minutes  15       METs  2.4         Biostep-RELP   Level  2       Minutes  15       METs  2         Track   Laps  12       Minutes  15       METs  1.6         Home Exercise Plan   Plans to continue exercise at  Home (comment) walking, gym up the street       Frequency  Add 1  additional day to program exercise  sessions.       Initial Home Exercises Provided  11/25/17          Exercise Comments: Exercise Comments    Row Name 11/18/17 1420           Exercise Comments  Stephanie Short went to see Dr. [name] yesterday and was okayed to increase to 4-5L for oxygen when exerting, but she needs 6L to maintain saturations above 88%. We will send another note if this does not post in progress notes from office visit.  We also talked about using her e-cylinders for home exercise versus her portable concentrator as it does not give her enough flow.           Exercise Goals and Review: Exercise Goals    Row Name 10/20/17 1238             Exercise Goals   Increase Physical Activity  Yes       Intervention  Provide advice, education, support and counseling about physical activity/exercise needs.;Develop an individualized exercise prescription for aerobic and resistive training based on initial evaluation findings, risk stratification, comorbidities and participant's personal goals.       Expected Outcomes  Short Term: Attend rehab on a regular basis to increase amount of physical activity.;Long Term: Add in home exercise to make exercise part of routine and to increase amount of physical activity.;Long Term: Exercising regularly at least 3-5 days a week.       Increase Strength and Stamina  Yes       Intervention  Provide advice, education, support and counseling about physical activity/exercise needs.;Develop an individualized exercise prescription for aerobic and resistive training based on initial evaluation findings, risk stratification, comorbidities and participant's personal goals.       Expected Outcomes  Short Term: Increase workloads from initial exercise prescription for resistance, speed, and METs.;Short Term: Perform resistance training exercises routinely during rehab and add in resistance training at home;Long Term: Improve cardiorespiratory fitness, muscular endurance and strength as measured by  increased METs and functional capacity (6MWT)       Able to understand and use rate of perceived exertion (RPE) scale  Yes       Intervention  Provide education and explanation on how to use RPE scale       Expected Outcomes  Short Term: Able to use RPE daily in rehab to express subjective intensity level;Long Term:  Able to use RPE to guide intensity level when exercising independently       Able to understand and use Dyspnea scale  Yes       Intervention  Provide education and explanation on how to use Dyspnea scale       Expected Outcomes  Short Term: Able to use Dyspnea scale daily in rehab to express subjective sense of shortness of breath during exertion;Long Term: Able to use Dyspnea scale to guide intensity level when exercising independently       Knowledge and understanding of Target Heart Rate Range (THRR)  Yes       Intervention  Provide education and explanation of THRR including how the numbers were predicted and where they are located for reference       Expected Outcomes  Short Term: Able to state/look up THRR;Short Term: Able to use daily as guideline for intensity in rehab;Long Term: Able to use THRR to govern intensity when exercising independently       Able to check pulse independently  Yes  Intervention  Provide education and demonstration on how to check pulse in carotid and radial arteries.;Review the importance of being able to check your own pulse for safety during independent exercise       Expected Outcomes  Short Term: Able to explain why pulse checking is important during independent exercise;Long Term: Able to check pulse independently and accurately       Understanding of Exercise Prescription  Yes       Intervention  Provide education, explanation, and written materials on patient's individual exercise prescription       Expected Outcomes  Short Term: Able to explain program exercise prescription;Long Term: Able to explain home exercise prescription to exercise  independently          Exercise Goals Re-Evaluation : Exercise Goals Re-Evaluation    Row Name 10/23/17 1150 10/26/17 1640 11/11/17 1404 11/24/17 1549 11/25/17 1402     Exercise Goal Re-Evaluation   Exercise Goals Review  Increase Physical Activity;Increase Strength and Stamina;Able to understand and use Dyspnea scale;Able to understand and use rate of perceived exertion (RPE) scale;Understanding of Exercise Prescription;Knowledge and understanding of Target Heart Rate Range (THRR)  Increase Physical Activity;Increase Strength and Stamina;Understanding of Exercise Prescription  Increase Physical Activity;Increase Strength and Stamina;Understanding of Exercise Prescription  Increase Physical Activity;Increase Strength and Stamina;Understanding of Exercise Prescription  Increase Physical Activity;Increase Strength and Stamina;Understanding of Exercise Prescription;Able to understand and use rate of perceived exertion (RPE) scale;Able to understand and use Dyspnea scale;Knowledge and understanding of Target Heart Rate Range (THRR);Able to check pulse independently   Comments  Reviewed RPE scale, THR and program prescription with pt today.  Pt voiced understanding and was given a copy of goals to take home.   Stephanie Short has completed two full days of exercise.  She continues to struggle with maintaining her saturations, especially while walking.  Note was sent to Dr. [name] to increase her oxygen flow rate during exercise.  We will continue to increase her time of exercise and monitor her progress.   Stephanie Short is doing well in rehab. She is now up to 14 laps while walking.  She still has not gotten permission to increase her oxygen flow for home use. We will send the request over again.   Once cleared for a high oxygen flow, we will review her home exercise guidelines.  We will begin to increase her other pieces of equipment and continue to monitor her progress.   Stephanie Short has continued to do well in rehab.  She now has a high  flow cannula for her 6L for exercise.  She is still working on getting a higher flow portable device but she does have e-cylinders she could use. We were going to go over her home exercise guidelines on her next visit but she has missed the last two sessions.  She is up to 18 laps and level 2 on the BioStep.  We will continue to monitor her progress.   Reviewed home exercise with pt today.  Pt plans to walk and go to gym up the street for exercise.  Reviewed THR, pulse, RPE, sign and symptoms, and when to call 911 or MD.  Also discussed weather considerations and indoor options.  Pt voiced understanding.   Expected Outcomes  Short: Use RPE daily to regulate intensity. Long: Follow program prescription in THR.  Short: Continue to attend reguarly.  Long: Continue to follow program prescription.   Short: Attend regularly and get clearance for higher oxygen flow rate.  Long: Continue to  increase activity levels.   Short: Review home exercise guidelines.  Long: Continue to increase activity.  Short: Start to add in at least one extra day a week at home.  Long: Continue to move more!!   [place] Name 12/08/17 1441 12/22/17 1438 01/01/18 1148         Exercise Goal Re-Evaluation   Exercise Goals Review  Increase Physical Activity;Increase Strength and Stamina;Understanding of Exercise Prescription  Increase Physical Activity;Increase Strength and Stamina;Understanding of Exercise Prescription  -     Comments  Stephanie Short has been doing well in rehab. She continues to try to walk more and is more diligent about watching to make sure her saturations do not drop. She is up to level 3 on the NuStep.  We will continue to monitor her progress.   Stephanie Short continues to do well in rehab.  She continues to struggle with saturations dropping during exercise which makes it hard to progress.  Her doctor has limited her to just 6L.  We will continue to monitor her progress.  Stephanie Short has not started her home exercise yet (walking at home). She wants to  start after the holidays since it is a busy time of year for her.      Expected Outcomes  Short: Continue to increase walking and move up on BioStep  Long: Continue to increase activity levels.   Short: Continue to walk more.  Long: Continue to increase strength and stamina.   Short: add one extra day of exercise. Long: become independent with exercise        Discharge Exercise Prescription (Final Exercise Prescription Changes): Exercise Prescription Changes - 12/22/17 1400      Response to Exercise   Blood Pressure (Admit)  122/70    Blood Pressure (Exit)  122/82    Heart Rate (Admit)  111 bpm    Heart Rate (Exercise)  117 bpm    Heart Rate (Exit)  96 bpm    Oxygen Saturation (Admit)  89 %    Oxygen Saturation (Exercise)  83 %    Oxygen Saturation (Exit)  92 %    Rating of Perceived Exertion (Exercise)  13    Perceived Dyspnea (Exercise)  2    Symptoms  desaturations    Duration  Progress to 45 minutes of aerobic exercise without signs/symptoms of physical distress   continues to need rest breaks on the track   Intensity  THRR unchanged      Progression   Progression  Continue to progress workloads to maintain intensity without signs/symptoms of physical distress.    Average METs  2      Resistance Training   Training Prescription  Yes    Weight  2 lbs    Reps  10-15      Interval Training   Interval Training  No      Oxygen   Oxygen  Continuous    Liters  6      NuStep   Level  3    Minutes  15    METs  2.4      Biostep-RELP   Level  2    Minutes  15    METs  2      Track   Laps  12    Minutes  15    METs  1.6      Home Exercise Plan   Plans to continue exercise at  Home (comment)   walking, gym up the street   Frequency  Add  1 additional day to program exercise sessions.    Initial Home Exercises Provided  11/25/17       Nutrition:  Target Goals: Understanding of nutrition guidelines, daily intake of sodium <1525m, cholesterol <2073m calories 30%  from fat and 7% or less from saturated fats, daily to have 5 or more servings of fruits and vegetables.  Biometrics: Pre Biometrics - 10/20/17 1238      Pre Biometrics   Height  _0  (1.626 m)    Weight  198 lb 11.2 oz (90.1 kg)    Waist Circumference  40 inches    Hip Circumference  45 inches    Waist to Hip Ratio  0.89 %    BMI (Calculated)  34.09    Single Leg Stand  22.07 seconds        Nutrition Therapy Plan and Nutrition Goals: Nutrition Therapy & Goals - 11/04/17 1517      Nutrition Therapy   Diet  DM    Protein (specify units)  10oz    Fiber  25 grams    Whole Grain Foods  3 servings   eats brown rice   Saturated Fats  12 max. grams    Fruits and Vegetables  5 servings/day   8 ideal; has been eating less vegetables since she was put on oxygen therapy d/t eating out more   Sodium  1500 grams      Personal Nutrition Goals   Nutrition Goal  Make adjustments to your evening snack regimen. You can either work to reduce portions of your current snacks (try single serve bags for example), or swap your current snack options for more nutrient dense options such as nuts, fruit, or peanut butter + low salt pretzels/ crackers    Personal Goal #2  Start to pay more attention to the amount of sodium you are consuming, especially because you have been eating out more frequently    Personal Goal #3  Work with your husband to start planning meals ahead of time using the ideas in the packet provided    Comments  She and her husband are both diabetics and are working to have a healthier eating plan. They have been eating out more often d/t pt not having the energy to cook at home. She feels she struggles with meal planning knowing how to pair foods together at meal times to make a complete/ nutritionally balanced meal. She does not drink sugar sweetened beverages and typically eats eggs for breakfast. She does not usually eat lunch and instead may snack on some fruit mid-day. She does not  eat beef. Feels that night time snacking is one of her worst habits      Intervention Plan   Intervention  Prescribe, educate and counsel regarding individualized specific dietary modifications aiming towards targeted core components such as weight, hypertension, lipid management, diabetes, heart failure and other comorbidities.;Nutrition handout(s) given to patient.   Quick and healthy meal ideas packet; Mediterranean diet informational packet   Expected Outcomes  Short Term Goal: A plan has been developed with personal nutrition goals set during dietitian appointment.;Long Term Goal: Adherence to prescribed nutrition plan.;Short Term Goal: Understand basic principles of dietary content, such as calories, fat, sodium, cholesterol and nutrients.       Nutrition Assessments: Nutrition Assessments - 10/20/17 1138      MEDFICTS Scores   Pre Score  69       Nutrition Goals Re-Evaluation: Nutrition Goals Re-Evaluation    [place] 11/04/17 [phone]  11/25/17 1401 12/28/17 1218         Goals   Nutrition Goal  Make adjustments to your evening snack regimen. You can either work to reduce portions of your current snacks (try single serve bags for example), or swap your current snack options for more nutrient dense options such as nuts, fruit, or peanut butter + low salt pretzels/ crackers  Make adjustments to your evening snack regimen by reducing portions or swapping current choices for more nutritious foods; work with your husband to start planning meals ahead of time; start to pay attention to the sodium content in foods  Make adjustments to your evening snack regimen by reducing portions and/or swapping current choices for more nutritious foods; work with your husband to plan meals ahead of time which will allow you to eat at home more frequently     Comment  She feels that her evening snacking habits are setting her back from working towards her health and weight loss goals  She has been choosing  snack options like nuts, fruit and baked chips (single-serve bags) rather than her traditional snack options. He husband has started to cook more leading to eating less meals eaten outside of the home. He is trying to bake rather than fry and chose leaner meats (chicken, crab, fish, lean pork). She continues not to drink sugar sweetened beverages. Does not usually eat lunch  She has been choosing evening snacks like baked chips, fruit and pistaccios but still reports portions to be an issue at times. Her husband has been "doing good" with cooking for them more often. She reports two episodes this past weekend where her BG dropped to 70 but was able to correct it with a piece of chocolate     Expected Outcome  She will reduce the amount of late night snacks she consumes and/or make more nutritious food choices when choosing to snack after dinner. She will work on portion control in this area  She will continue to make more health-conscious snack choices and keep portions more controlled. She will continue to work with her husband to prepare more heart-healthy meals at home and choose lean meats most often as protein sources. When/if she is able to come off of oxygen therapy she will start to assist her husband in meal preparation  She will continue to work on being mindful of portions for snacks and she and her husband will continue to eat more meals at home. She will work on eating on a regular time schedule (not skipping her 3rd meal) to prevent episodes of low blood sugar       Personal Goal #2 Re-Evaluation   Personal Goal #2  Start to pay attention to the amount of sodium you are consuming, especially because you have been eating out more frequently  -  -       Personal Goal #3 Re-Evaluation   Personal Goal #3  Work with your husband to start planning meals ahead of time by using the ideas in the packet provided  -  -        Nutrition Goals Discharge (Final Nutrition Goals Re-Evaluation): Nutrition  Goals Re-Evaluation - 12/28/17 1218      Goals   Nutrition Goal  Make adjustments to your evening snack regimen by reducing portions and/or swapping current choices for more nutritious foods; work with your husband to plan meals ahead of time which will allow you to eat at home more frequently    Comment  She  has been choosing evening snacks like baked chips, fruit and pistaccios but still reports portions to be an issue at times. Her husband has been "doing good" with cooking for them more often. She reports two episodes this past weekend where her BG dropped to 70 but was able to correct it with a piece of chocolate    Expected Outcome  She will continue to work on being mindful of portions for snacks and she and her husband will continue to eat more meals at home. She will work on eating on a regular time schedule (not skipping her 3rd meal) to prevent episodes of low blood sugar       Psychosocial: Target Goals: Acknowledge presence or absence of significant depression and/or stress, maximize coping skills, provide positive support system. Participant is able to verbalize types and ability to use techniques and skills needed for reducing stress and depression.   Initial Review & Psychosocial Screening: Initial Psych Review & Screening - 10/20/17 1138      Initial Review   Current issues with  Current Stress Concerns;Current Depression    Source of Stress Concerns  Chronic Illness;Unable to perform yard/household activities    Comments  Her COPD and shortness of breath is what stresses her out the most. She cannot walk as far and do the things she use to do.       Family Dynamics   Good Support System?  Yes    Comments  She can look to her husband and gradnson for support.      Barriers   Psychosocial barriers to participate in program  The patient should benefit from training in stress management and relaxation.      Screening Interventions   Interventions  Encouraged to  exercise;Program counselor consult;To provide support and resources with identified psychosocial needs;Provide feedback about the scores to participant    Expected Outcomes  Short Term goal: Utilizing psychosocial counselor, staff and physician to assist with identification of specific Stressors or current issues interfering with healing process. Setting desired goal for each stressor or current issue identified.;Long Term Goal: Stressors or current issues are controlled or eliminated.;Short Term goal: Identification and review with participant of any Quality of Life or Depression concerns found by scoring the questionnaire.;Long Term goal: The participant improves quality of Life and PHQ9 Scores as seen by post scores and/or verbalization of changes       Quality of Life Scores:  Scores of 19 and below usually indicate a poorer quality of life in these areas.  A difference of  2-3 points is a clinically meaningful difference.  A difference of 2-3 points in the total score of the Quality of Life Index has been associated with significant improvement in overall quality of life, self-image, physical symptoms, and general health in studies assessing change in quality of life.  PHQ-9: Recent Review Flowsheet Data    Depression screen [facility] 2/9 12/21/2017 12/02/2017 10/20/2017 10/13/2017 07/30/2017   Decreased Interest 1 1 0 0 0   Down, Depressed, Hopeless _0 0   PHQ - 2 Score _1 0   Altered sleeping _2 - -   Tired, decreased energy _3 - -   Change in appetite _4 - -   Feeling bad or failure about yourself  0 1 3 - -   Trouble concentrating 0 0 0 - -   Moving slowly or fidgety/restless 0 1 3 - -   Suicidal  thoughts 0 0 0 - -   PHQ-9 Score _0 - -   Difficult doing work/chores Somewhat difficult Very difficult Extremely dIfficult - -     Interpretation of Total Score  Total Score Depression Severity:  1-4 = Minimal depression, 5-9 = Mild depression, 10-14 = Moderate depression,  15-19 = Moderately severe depression, 20-27 = Severe depression   Psychosocial Evaluation and Intervention: Psychosocial Evaluation - 10/26/17 1219      Psychosocial Evaluation & Interventions   Interventions  Relaxation education;Encouraged to exercise with the program and follow exercise prescription    Comments  Counselor met with Ms. Harrel Lemon Stephanie Short) for initial psychosocial evaluation.  She is a 67 year old who struggles with COPD and she has diabetes as well.  Stephanie Short has a strong support system with a spouse of 48 years and a grandson who lives in the home.  She has (4) adult children who live out of state.  Stephanie Short reports sleeping well most of the time and has a good appetite.  She reports a history of depression diagnosed in 2013 and was treated with counseling.  She also reports some current anxiety symptoms and has a RX to take PRN - but reports taking it rarely.  Stephanie Short reports her mood is impacted by her health currently - and that is her primary stressor at this time.  Counselor discussed her PHQ-9 Score of "13" which indicates moderated symptoms of depression currently, with a report of feeling bad about herself and feeling down depressed or hopeless at times.  Counselor encouraged Stephanie Short to consider going back to counseling and she does not want to go to the same provider.  She also wants to see if this program will help with her mood.  Counselor suggested a follow up discussion in several weeks to see if her symptoms have improved.  Stephanie Short has goals to lose weight and just to "breathe better."    Expected Outcomes  Short:  Stephanie Short will meet with the dietician to address her weight loss goal.  She will also exercise to decrease her anxiety and improve her mood and ability to breathe better.  Long:  Stephanie Short will exercise consistently to improve her health and mental health    Continue Psychosocial Services   Follow up required by counselor       Psychosocial Re-Evaluation: Psychosocial Re-Evaluation    [place] Name  11/04/17 [place] 11/04/17 1710 12/02/17 1211 12/07/17 1231 12/21/17 1454     Psychosocial Re-Evaluation   Current issues with  Current Stress Concerns;History of Depression;Current Depression  -  Current Stress Concerns;History of Depression;Current Depression  Current Psychotropic Meds;Current Stress Concerns;History of Depression  History of Depression;Current Depression;Current Stress Concerns   Comments  Counselor met with Stephanie Short and her spouse today for follow up.  She reports her mood and quality of life seem to both be getting worse.  Her spouse reports noticing less "spunk" and less energy in his wife.  She states her sleep has improved a little with the new CPAP over the past month and she may be getting approximately 6 hours of sleep now.  She has a history of grief/loss with a son that died tragically in an accident in 2013.  She went to counseling briefly immediately after this occurred but felt she "was not ready." and she also has takens some medication for anxiety - but not consistently so not very effective.  Counselor recommended Stephanie Short find another therapist to help her through this difficult time.  Also,  counselor provided contact info on potential therapists and grief support groups for her.  She is encouraged to speak with her Dr. about a medication evaluation for her depressive symptoms as well in the near future.  Stephanie Short and her husband agreed to these recommendations.  Counselor will follow.    -  Reviewed patient health questionnaire (PHQ-9) with patient for follow up. Previously, patients score indicated signs/symptoms of depression.  Reviewed to see if patient is improving symptom wise while in program.  Score improved and patient states that it is because she has family in town. She has been going out more and it feels good for her to get out of the house.  Counselor follow up with Stephanie Short today reporting she is sleeping so much better with greater ease getting to sleep and improved quality since she  began her CPAP recently.  She reports having a little more "spunk" lately with her adult children coming 'home' to visit and she also reports she is learning to accept what she cannot change in her life better.  Stephanie Short is excited that she may be participating in a clinical trial later this week and also may be reconsidered for a program at [facility] with a new Medication for COPD.  She is very hopeful about this.  Counselor commended Stephanie Short on her progress made and her hard work while in this program.    Reviewed patient health questionnaire (PHQ-9) with patient for follow up. Previously, patients score indicated signs/symptoms of depression.  Reviewed to see if patient is improving symptom wise while in program.  Score improved and patient states that it is because they have been exercising at [place]. She has been doing well and able to cope with her disease better since she started the program.   Expected Outcomes  -  Short:  Stephanie Short will contact a counselor and research the support group to help her during this difficult time emotionally.  She will contact her Dr. about a medication evaluation once she establishes a baseline with a counselor.  Long:  Stephanie Short will continue to practice positive self-care for her health and mental health.    Short: Continue to attend [facility] regularly for regular exercise and social engagement. Long: Continue to improve symptoms and manage a positive mental state  Short:  Stephanie Short will continue to exercise consistently and continue practicing ways to manage her stress more positively.  Long:  Stephanie Short will exercise and practice positive self-care for her improved quality of life.   Short: Continue to attend [facility] regularly for regular exercise and social engagement. Long: Continue to improve symptoms and manage a positive mental state   Interventions  -  -  Stress management education;Encouraged to attend Pulmonary Rehabilitation for the exercise  -  Encouraged to attend Pulmonary Rehabilitation for  the exercise;Stress management education   Continue Psychosocial Services   Follow up required by counselor  -  Follow up required by staff  Follow up required by staff  Follow up required by staff      Psychosocial Discharge (Final Psychosocial Re-Evaluation): Psychosocial Re-Evaluation - 12/21/17 1454      Psychosocial Re-Evaluation   Current issues with  History of Depression;Current Depression;Current Stress Concerns    Comments  Reviewed patient health questionnaire (PHQ-9) with patient for follow up. Previously, patients score indicated signs/symptoms of depression.  Reviewed to see if patient is improving symptom wise while in program.  Score improved and patient states that it is because they have been exercising at [place]. She  has been doing well and able to cope with her disease better since she started the program.    Expected Outcomes  Short: Continue to attend [facility] regularly for regular exercise and social engagement. Long: Continue to improve symptoms and manage a positive mental state    Interventions  Encouraged to attend Pulmonary Rehabilitation for the exercise;Stress management education    Continue Psychosocial Services   Follow up required by staff       Education: Education Goals: Education classes will be provided on a weekly basis, covering required topics. Participant will state understanding/return demonstration of topics presented.  Learning Barriers/Preferences: Learning Barriers/Preferences - 10/20/17 1138      Learning Barriers/Preferences   Learning Barriers  None    Learning Preferences  None       Education Topics:  Initial Evaluation Education: - Verbal, written and demonstration of respiratory meds, oximetry and breathing techniques. Instruction on use of nebulizers and MDIs and importance of monitoring MDI activations.   Pulmonary Rehab from 12/25/2017 in [facility] Cardiac and Pulmonary Rehab  Date  10/20/17  Educator  [facility]  Instruction Review  Code  1- Verbalizes Understanding      General Nutrition Guidelines/Fats and Fiber: -Group instruction provided by verbal, written material, models and posters to present the general guidelines for heart healthy nutrition. Gives an explanation and review of dietary fats and fiber.   Pulmonary Rehab from 12/25/2017 in [facility] Cardiac and Pulmonary Rehab  Date  11/25/17  Educator  [facility]  Instruction Review Code  1- Verbalizes Understanding      Controlling Sodium/Reading Food Labels: -Group verbal and written material supporting the discussion of sodium use in heart healthy nutrition. Review and explanation with models, verbal and written materials for utilization of the food label.   Pulmonary Rehab from 12/25/2017 in [facility] Cardiac and Pulmonary Rehab  Date  12/09/17  Educator  [facility]  Instruction Review Code  1- Verbalizes Understanding      Exercise Physiology & General Exercise Guidelines: - Group verbal and written instruction with models to review the exercise physiology of the cardiovascular system and associated critical values. Provides general exercise guidelines with specific guidelines to those with heart or lung disease.    Pulmonary Rehab from 12/25/2017 in [facility] Cardiac and Pulmonary Rehab  Date  12/25/17  Educator  [facility]  Instruction Review Code  1- Verbalizes Understanding      Aerobic Exercise & Resistance Training: - Gives group verbal and written instruction on the various components of exercise. Focuses on aerobic and resistive training programs and the benefits of this training and how to safely progress through these programs.   Pulmonary Rehab from 12/25/2017 in [facility] Cardiac and Pulmonary Rehab  Date  10/28/17  Educator  [facility]  Instruction Review Code  1- Verbalizes Understanding      Flexibility, Balance, Mind/Body Relaxation: Provides group verbal/written instruction on the benefits of flexibility and balance training, including mind/body exercise modes such as yoga,  pilates and tai chi.  Demonstration and skill practice provided.   Stress and Anxiety: - Provides group verbal and written instruction about the health risks of elevated stress and causes of high stress.  Discuss the correlation between heart/lung disease and anxiety and treatment options. Review healthy ways to manage with stress and anxiety.   Pulmonary Rehab from 12/25/2017 in [facility] Cardiac and Pulmonary Rehab  Date  12/02/17  Educator  [facility]  Instruction Review Code  1- Verbalizes Understanding      Depression: - Provides group verbal  and written instruction on the correlation between heart/lung disease and depressed mood, treatment options, and the stigmas associated with seeking treatment.   Pulmonary Rehab from 12/25/2017 in [facility] Cardiac and Pulmonary Rehab  Date  11/04/17  Educator  [facility]  Instruction Review Code  1- Verbalizes Understanding      Exercise & Equipment Safety: - Individual verbal instruction and demonstration of equipment use and safety with use of the equipment.   Pulmonary Rehab from 12/25/2017 in [facility] Cardiac and Pulmonary Rehab  Date  10/20/17  Educator  [facility]  Instruction Review Code  1- Verbalizes Understanding      Infection Prevention: - Provides verbal and written material to individual with discussion of infection control including proper hand washing and proper equipment cleaning during exercise session.   Pulmonary Rehab from 12/25/2017 in [facility] Cardiac and Pulmonary Rehab  Date  10/20/17  Educator  [facility]  Instruction Review Code  1- Verbalizes Understanding      Falls Prevention: - Provides verbal and written material to individual with discussion of falls prevention and safety.   Pulmonary Rehab from 12/25/2017 in [facility] Cardiac and Pulmonary Rehab  Date  10/20/17  Educator  [facility]  Instruction Review Code  1- Verbalizes Understanding      Diabetes: - Individual verbal and written instruction to review signs/symptoms of diabetes, desired ranges of glucose  level fasting, after meals and with exercise. Advice that pre and post exercise glucose checks will be done for 3 sessions at entry of program.   Chronic Lung Diseases: - Group verbal and written instruction to review updates, respiratory medications, advancements in procedures and treatments. Discuss use of supplemental oxygen including available portable oxygen systems, continuous and intermittent flow rates, concentrators, personal use and safety guidelines. Review proper use of inhaler and spacers. Provide informative websites for self-education.    Pulmonary Rehab from 12/25/2017 in [facility] Cardiac and Pulmonary Rehab  Date  12/23/17  Educator  [facility]  Instruction Review Code  1- Verbalizes Understanding      Energy Conservation: - Provide group verbal and written instruction for methods to conserve energy, plan and organize activities. Instruct on pacing techniques, use of adaptive equipment and posture/positioning to relieve shortness of breath.   Pulmonary Rehab from 12/25/2017 in [facility] Cardiac and Pulmonary Rehab  Date  11/18/17  Educator  [facility]  Instruction Review Code  1- Verbalizes Understanding      Triggers and Exacerbations: - Group verbal and written instruction to review types of environmental triggers and ways to prevent exacerbations. Discuss weather changes, air quality and the benefits of nasal washing. Review warning signs and symptoms to help prevent infections. Discuss techniques for effective airway clearance, coughing, and vibrations.   AED/CPR: - Group verbal and written instruction with the use of models to demonstrate the basic use of the AED with the basic ABC's of resuscitation.   Pulmonary Rehab from 12/25/2017 in [facility] Cardiac and Pulmonary Rehab  Date  11/27/17  Educator  [facility]  Instruction Review Code  1- Actuary and Physiology of the Lungs: - Group verbal and written instruction with the use of models to provide basic lung anatomy and  physiology related to function, structure and complications of lung disease.   Anatomy & Physiology of the Heart: - Group verbal and written instruction and models provide basic cardiac anatomy and physiology, with the coronary electrical and arterial systems. Review of Valvular disease and Heart Failure   Pulmonary Rehab from 12/25/2017 in [facility]  Cardiac and Pulmonary Rehab  Date  10/30/17  Educator  [facility]  Instruction Review Code  1- Verbalizes Understanding      Cardiac Medications: - Group verbal and written instruction to review commonly prescribed medications for heart disease. Reviews the medication, class of the drug, and side effects.   Pulmonary Rehab from 12/25/2017 in [facility] Cardiac and Pulmonary Rehab  Date  11/13/17  Educator  [facility]  Instruction Review Code  1- Verbalizes Understanding      Know Your Numbers and Risk Factors: -Group verbal and written instruction about important numbers in your health.  Discussion of what are risk factors and how they play a role in the disease process.  Review of Cholesterol, Blood Pressure, Diabetes, and BMI and the role they play in your overall health.   Sleep Hygiene: -Provides group verbal and written instruction about how sleep can affect your health.  Define sleep hygiene, discuss sleep cycles and impact of sleep habits. Review good sleep hygiene tips.    Other: -Provides group and verbal instruction on various topics (see comments)    Knowledge Questionnaire Score: Knowledge Questionnaire Score - 10/20/17 1138      Knowledge Questionnaire Score   Pre Score  14/18   reviewed with patient        Core Components/Risk Factors/Patient Goals at Admission: Personal Goals and Risk Factors at Admission - 10/20/17 1144      Core Components/Risk Factors/Patient Goals on Admission    Weight Management  Yes;Weight Loss    Intervention  Weight Management: Develop a combined nutrition and exercise program designed to reach desired caloric  intake, while maintaining appropriate intake of nutrient and fiber, sodium and fats, and appropriate energy expenditure required for the weight goal.;Weight Management: Provide education and appropriate resources to help participant work on and attain dietary goals.;Weight Management/Obesity: Establish reasonable short term and long term weight goals.    Admit Weight  198 lb 11.2 oz (90.1 kg)    Goal Weight: Short Term  193 lb (87.5 kg)    Goal Weight: Long Term  150 lb (68 kg)    Expected Outcomes  Short Term: Continue to assess and modify interventions until short term weight is achieved;Long Term: Adherence to nutrition and physical activity/exercise program aimed toward attainment of established weight goal;Weight Loss: Understanding of general recommendations for a balanced deficit meal plan, which promotes 1-2 lb weight loss per week and includes a negative energy balance of [phone] kcal/d;Understanding recommendations for meals to include 15-35% energy as protein, 25-35% energy from fat, 35-60% energy from carbohydrates, less than 233m of dietary cholesterol, 20-35 gm of total fiber daily;Understanding of distribution of calorie intake throughout the day with the consumption of 4-5 meals/snacks    Improve shortness of breath with ADL's  Yes    Intervention  Provide education, individualized exercise plan and daily activity instruction to help decrease symptoms of SOB with activities of daily living.    Expected Outcomes  Short Term: Improve cardiorespiratory fitness to achieve a reduction of symptoms when performing ADLs;Long Term: Be able to perform more ADLs without symptoms or delay the onset of symptoms    Diabetes  Yes    Intervention  Provide education about proper nutrition, including hydration, and aerobic/resistive exercise prescription along with prescribed medications to achieve blood glucose in normal ranges: Fasting glucose 65-99 mg/dL;Provide education about signs/symptoms and action  to take for hypo/hyperglycemia.    Expected Outcomes  Long Term: Attainment of HbA1C < 7%.;Short Term: Participant verbalizes understanding  of the signs/symptoms and immediate care of hyper/hypoglycemia, proper foot care and importance of medication, aerobic/resistive exercise and nutrition plan for blood glucose control.    Hypertension  Yes    Intervention  Provide education on lifestyle modifcations including regular physical activity/exercise, weight management, moderate sodium restriction and increased consumption of fresh fruit, vegetables, and low fat dairy, alcohol moderation, and smoking cessation.;Monitor prescription use compliance.    Expected Outcomes  Short Term: Continued assessment and intervention until BP is < 140/42m HG in hypertensive participants. < 130/845mHG in hypertensive participants with diabetes, heart failure or chronic kidney disease.;Long Term: Maintenance of blood pressure at goal levels.       Core Components/Risk Factors/Patient Goals Review:  Goals and Risk Factor Review    Row Name 12/02/17 1221 01/01/18 1148           Core Components/Risk Factors/Patient Goals Review   Personal Goals Review  Weight Management/Obesity;Improve shortness of breath with ADL's;Hypertension;Diabetes  Weight Management/Obesity;Improve shortness of breath with ADL's;Hypertension;Diabetes      Review  Patient has been checking her blood sugar at home and has not been over 160. She has been checking her blood pressure at home and states it has been good. Her ADL's have not been getting any easier for her. She may be eligable for a lung program to have other treatment options.  She is going in for a heart cath next Wednesday [place] when she is back.  Her cath was clean, showed mild to moderate pulmonary hypertension. Stephanie Short's weight has not changed. She is still monitoring her blood sugar which has been running 140s-160s. She is still  experiencing tiredness, but can tell an improvement. She realized she does not increase her oxygen when she is up moving around       Expected Outcomes  -  Short: increase oxygen when she is active to help with shortness of breath. Long: continue monitoring blood sugar and blood pressure.          Core Components/Risk Factors/Patient Goals at Discharge (Final Review):  Goals and Risk Factor Review - 01/01/18 1148      Core Components/Risk Factors/Patient Goals Review   Personal Goals Review  Weight Management/Obesity;Improve shortness of breath with ADL's;Hypertension;Diabetes    Review  Her cath was clean, showed mild to moderate pulmonary hypertension. Stephanie Short's weight has not changed. She is still monitoring her blood sugar which has been running 140s-160s. She is still experiencing tiredness, but can tell an improvement. She realized she does not increase her oxygen when she is up moving around     Expected Outcomes  Short: increase oxygen when she is active to help with shortness of breath. Long: continue monitoring blood sugar and blood pressure.        ITP Comments: ITP Comments    Row Name 10/20/17 1108 10/23/17 1151 11/09/17 0901 11/18/17 1420 12/07/17 0845   ITP Comments  Medical Evaluation completed. Chart sent for review and changes to Dr. MaEmily Filbertirector of [place] [facility]  First full day of exercise!  Patient was oriented to gym and equipment including functions, settings, policies, and procedures.  Patient's individual exercise prescription and treatment plan were reviewed.  All starting workloads were established based on the results of the 6 minute walk test done at initial orientation visit.  The plan for exercise progression was also introduced and progression will be customized based on  patient's performance and goals  30 day review completed. ITP sent to Dr. Emily Filbert Director of [place]. Continue with ITP unless changes are made by  physician.  Stephanie Short went to see Dr. [name] yesterday and was okayed to increase to 4-5L for oxygen when exerting, but she needs 6L to maintain saturations above 88%. We will send another note if this does not post in progress notes from office visit.  We also talked about using her e-cylinders for home exercise versus her portable concentrator as it does not give her enough flow.   30 day review completed. ITP sent to Dr. Emily Filbert Director of [place]. Continue with ITP unless changes are made by physician.   [place] Name 01/04/18 0852           ITP Comments  30 day review completed. ITP sent to Dr. Emily Filbert Director of [place]. Continue with ITP unless changes are made by physician.          Comments: 30 day review

## 2018-01-04 NOTE — Progress Notes (Signed)
Daily Session Note  Patient Details  Name: Stephanie Short MRN: [id number] Date of Birth: 1950-04-21 Referring Provider:     Pulmonary Rehab from 10/20/2017 in [facility] Cardiac and Pulmonary Rehab  Referring Provider  [name]      Encounter Date: 01/04/2018  Check In:      Social History   Tobacco Use  Smoking Status Former Smoker  . Packs/day: 1.00  . Years: 20.00  . Pack years: 20.00  . Types: Cigarettes  . Last attempt to quit: 01/21/2004  . Years since quitting: 13.9  Smokeless Tobacco Never Used    Goals Met:  Proper associated with RPD/PD & O2 Sat Independence with exercise equipment Exercise tolerated well Strength training completed today  Goals Unmet:  Not Applicable  Comments: Pt able to follow exercise prescription today without complaint.  Will continue to monitor for progression.    Dr. Emily Filbert is Medical Director for [place] and [facility].

## 2018-01-06 ENCOUNTER — Encounter: Payer: 59 | Admitting: *Deleted

## 2018-01-06 DIAGNOSIS — J449 Chronic obstructive pulmonary disease, unspecified: Secondary | ICD-10-CM | POA: Diagnosis not present

## 2018-01-06 NOTE — Progress Notes (Signed)
Daily Session Note  Patient Details  Name: FLORENE BRILL MRN: [id number] Date of Birth: 1950/10/07 Referring Provider:     Pulmonary Rehab from 10/20/2017 in [facility] Cardiac and Pulmonary Rehab  Referring Provider  [name]      Encounter Date: 01/06/2018  Check In: Session Check In - 01/06/18 1127      Check-In   Supervising physician immediately available to respond to emergencies  [facility] immediately available ER MD    Physician(s)  Drs. Alyse Low    Location  [facility]-Cardiac & Pulmonary Rehab    Staff Present  Alberteen Sam, MA, RCEP, CCRP, Exercise Physiologist;Joseph Alcus Dad, RN BSN    Medication changes reported      No    Fall or balance concerns reported     No    Warm-up and Cool-down  Performed as group-led Higher education careers adviser Performed  Yes    VAD Patient?  No    PAD/SET Patient?  No      Pain Assessment   Currently in Pain?  No/denies          Social History   Tobacco Use  Smoking Status Former Smoker  . Packs/day: 1.00  . Years: 20.00  . Pack years: 20.00  . Types: Cigarettes  . Last attempt to quit: 01/21/2004  . Years since quitting: 13.9  Smokeless Tobacco Never Used    Goals Met:  Proper associated with RPD/PD & O2 Sat Independence with exercise equipment Using PLB without cueing & demonstrates good technique Exercise tolerated well No report of cardiac concerns or symptoms Strength training completed today  Goals Unmet:  Not Applicable  Comments: Pt able to follow exercise prescription today without complaint.  Will continue to monitor for progression.    Dr. Emily Filbert is Medical Director for [place] and [facility].

## 2018-01-08 ENCOUNTER — Encounter: Payer: 59 | Admitting: *Deleted

## 2018-01-08 DIAGNOSIS — J449 Chronic obstructive pulmonary disease, unspecified: Secondary | ICD-10-CM

## 2018-01-08 NOTE — Progress Notes (Signed)
Daily Session Note  Patient Details  Name: Stephanie Short MRN: [id number] Date of Birth: September 22, 1950 Referring Provider:     Pulmonary Rehab from 10/20/2017 in [facility] Cardiac and Pulmonary Rehab  Referring Provider  [name]      Encounter Date: 01/08/2018  Check In: Session Check In - 01/08/18 1120      Check-In   Supervising physician immediately available to respond to emergencies  [facility] immediately available ER MD    Physician(s)  Drs. Insurance risk surveyor & Pulmonary Rehab    Staff Present  Alberteen Sam, MA, RCEP, CCRP, Exercise Physiologist;Joseph Alcus Dad, RN BSN    Medication changes reported      No    Fall or balance concerns reported     No    Tobacco Cessation  No Change    Warm-up and Cool-down  Performed as group-led instruction    Resistance Training Performed  Yes    VAD Patient?  No    PAD/SET Patient?  No      Pain Assessment   Currently in Pain?  No/denies          Social History   Tobacco Use  Smoking Status Former Smoker  . Packs/day: 1.00  . Years: 20.00  . Pack years: 20.00  . Types: Cigarettes  . Last attempt to quit: 01/21/2004  . Years since quitting: 13.9  Smokeless Tobacco Never Used    Goals Met:  Proper associated with RPD/PD & O2 Sat Independence with exercise equipment Using PLB without cueing & demonstrates good technique Exercise tolerated well No report of cardiac concerns or symptoms Strength training completed today  Goals Unmet:  Not Applicable  Comments: Pt able to follow exercise prescription today without complaint.  Will continue to monitor for progression.    Dr. Emily Filbert is Medical Director for [place] and [facility].

## 2018-01-15 ENCOUNTER — Encounter: Payer: 59 | Admitting: *Deleted

## 2018-01-15 DIAGNOSIS — J449 Chronic obstructive pulmonary disease, unspecified: Secondary | ICD-10-CM | POA: Diagnosis not present

## 2018-01-15 NOTE — Progress Notes (Signed)
Daily Session Note  Patient Details  Name: Stephanie Short MRN: [id number] Date of Birth: March 15, 1950 Referring Provider:     Pulmonary Rehab from 10/20/2017 in [facility] Cardiac and Pulmonary Rehab  Referring Provider  [name]      Encounter Date: 01/15/2018  Check In: Session Check In - 01/15/18 1135      Check-In   Supervising physician immediately available to respond to emergencies  [facility] immediately available ER MD    Physician(s)  Dr. Jimmye Norman and [facility]     Location  [facility]-Cardiac & Pulmonary Rehab    Staff Present  Alberteen Sam, MA, RCEP, CCRP, Exercise Physiologist;Laureen Owens Shark, BS, RRT, Respiratory Therapist;Kendall Justo Sherryll Burger, RN BSN    Medication changes reported      No    Fall or balance concerns reported     No    Tobacco Cessation  No Change    Warm-up and Cool-down  Performed as group-led instruction    Resistance Training Performed  Yes    VAD Patient?  No    PAD/SET Patient?  No      Pain Assessment   Currently in Pain?  No/denies          Social History   Tobacco Use  Smoking Status Former Smoker  . Packs/day: 1.00  . Years: 20.00  . Pack years: 20.00  . Types: Cigarettes  . Last attempt to quit: 01/21/2004  . Years since quitting: 13.9  Smokeless Tobacco Never Used    Goals Met:  Proper associated with RPD/PD & O2 Sat Independence with exercise equipment Using PLB without cueing & demonstrates good technique Exercise tolerated well No report of cardiac concerns or symptoms Strength training completed today  Goals Unmet:  Not Applicable  Comments: Pt able to follow exercise prescription today without complaint.  Will continue to monitor for progression.    Dr. Emily Filbert is Medical Director for [place] and [facility].

## 2018-01-18 ENCOUNTER — Encounter: Payer: 59 | Admitting: *Deleted

## 2018-01-18 DIAGNOSIS — J449 Chronic obstructive pulmonary disease, unspecified: Secondary | ICD-10-CM | POA: Diagnosis not present

## 2018-01-18 NOTE — Progress Notes (Signed)
Daily Session Note  Patient Details  Name: Stephanie Short MRN: [id number] Date of Birth: [birth year] Referring Provider:     Pulmonary Rehab from 10/20/2017 in [facility] Cardiac and Pulmonary Rehab  Referring Provider  [name]      Encounter Date: 01/18/2018  Check In: Session Check In - 01/18/18 1149      Check-In   Supervising physician immediately available to respond to emergencies  [facility] immediately available ER MD    Physician(s)  Dr. Cinda Quest and Quentin Cornwall    Location  [facility]-Cardiac & Pulmonary Rehab    Staff Present  Renita Papa, RN BSN;Jeanna Durrell BS, Exercise Physiologist;Joseph Tessie Fass RCP,RRT,BSRT    Medication changes reported      No    Fall or balance concerns reported     No    Warm-up and Cool-down  Performed as group-led instruction    Resistance Training Performed  Yes    VAD Patient?  No    PAD/SET Patient?  No      Pain Assessment   Currently in Pain?  No/denies          Social History   Tobacco Use  Smoking Status Former Smoker  . Packs/day: 1.00  . Years: 20.00  . Pack years: 20.00  . Types: Cigarettes  . Last attempt to quit: 01/21/2004  . Years since quitting: 14.0  Smokeless Tobacco Never Used    Goals Met:  Proper associated with RPD/PD & O2 Sat Independence with exercise equipment Using PLB without cueing & demonstrates good technique Exercise tolerated well No report of cardiac concerns or symptoms Strength training completed today  Goals Unmet:  Not Applicable  Comments: Pt able to follow exercise prescription today without complaint.  Will continue to monitor for progression.    Dr. Emily Filbert is Medical Director for [place] and [facility].

## 2018-01-25 ENCOUNTER — Encounter: Payer: Self-pay | Admitting: Nurse Practitioner

## 2018-01-25 ENCOUNTER — Encounter: Payer: 59 | Attending: Specialist

## 2018-01-25 ENCOUNTER — Ambulatory Visit: Payer: 59 | Attending: Nurse Practitioner | Admitting: Nurse Practitioner

## 2018-01-25 ENCOUNTER — Other Ambulatory Visit: Payer: Self-pay

## 2018-01-25 DIAGNOSIS — Z79899 Other long term (current) drug therapy: Secondary | ICD-10-CM | POA: Insufficient documentation

## 2018-01-25 DIAGNOSIS — I110 Hypertensive heart disease with heart failure: Secondary | ICD-10-CM | POA: Diagnosis not present

## 2018-01-25 DIAGNOSIS — F329 Major depressive disorder, single episode, unspecified: Secondary | ICD-10-CM | POA: Insufficient documentation

## 2018-01-25 DIAGNOSIS — Z87891 Personal history of nicotine dependence: Secondary | ICD-10-CM | POA: Diagnosis not present

## 2018-01-25 DIAGNOSIS — Z7984 Long term (current) use of oral hypoglycemic drugs: Secondary | ICD-10-CM | POA: Insufficient documentation

## 2018-01-25 DIAGNOSIS — I509 Heart failure, unspecified: Secondary | ICD-10-CM | POA: Insufficient documentation

## 2018-01-25 DIAGNOSIS — J449 Chronic obstructive pulmonary disease, unspecified: Secondary | ICD-10-CM | POA: Diagnosis not present

## 2018-01-25 DIAGNOSIS — E119 Type 2 diabetes mellitus without complications: Secondary | ICD-10-CM | POA: Diagnosis not present

## 2018-01-25 DIAGNOSIS — Z9981 Dependence on supplemental oxygen: Secondary | ICD-10-CM | POA: Insufficient documentation

## 2018-01-25 DIAGNOSIS — G894 Chronic pain syndrome: Secondary | ICD-10-CM | POA: Insufficient documentation

## 2018-01-25 DIAGNOSIS — Z7982 Long term (current) use of aspirin: Secondary | ICD-10-CM | POA: Insufficient documentation

## 2018-01-25 DIAGNOSIS — G4700 Insomnia, unspecified: Secondary | ICD-10-CM | POA: Diagnosis not present

## 2018-01-25 MED ORDER — OXYCODONE HCL 5 MG PO TABS: 2.5000 mg | ORAL_TABLET | ORAL | 0 refills | Status: AC | PRN

## 2018-01-25 MED ORDER — VITAMIN B-12 1000 MCG PO TABS: 1000.0000 ug | ORAL_TABLET | Freq: Every day | ORAL | 1 refills | Status: DC

## 2018-01-25 MED ORDER — ONDANSETRON 4 MG PO TBDP: ORAL_TABLET | ORAL | 0 refills | Status: AC

## 2018-01-25 NOTE — Patient Instructions (Signed)
____________________________________________________________________________________________  Medication Rules  Purpose: To inform patients, and their family members, of our rules and regulations.  Applies to: All patients receiving prescriptions (written or electronic).  Pharmacy of record: Pharmacy where electronic prescriptions will be sent. If written prescriptions are taken to a different pharmacy, please inform the nursing staff. The pharmacy listed in the electronic medical record should be the one where you would like electronic prescriptions to be sent.  Electronic prescriptions: In compliance with the [place] (STOP) Act of 2017 (Session Lanny Cramp [phone]), effective January 20, 2018, all controlled substances must be electronically prescribed. Calling prescriptions to the pharmacy will cease to exist.  Prescription refills: Only during scheduled appointments. Applies to all prescriptions.  NOTE: The following applies primarily to controlled substances (Opioid* Pain Medications).   Patient's responsibilities: 1. Pain Pills: Bring all pain pills to every appointment (except for procedure appointments). 2. Pill Bottles: Bring pills in original pharmacy bottle. Always bring the newest bottle. Bring bottle, even if empty. 3. Medication refills: You are responsible for knowing and keeping track of what medications you take and those you need refilled. The day before your appointment: write a list of all prescriptions that need to be refilled. The day of the appointment: give the list to the admitting nurse. Prescriptions will be written only during appointments. If you forget a medication: it will not be "Called in", "Faxed", or "electronically sent". You will need to get another appointment to get these prescribed. No early refills. Do not call asking to have your prescription filled early. 4. Prescription Accuracy: You are responsible for  carefully inspecting your prescriptions before leaving our office. Have the discharge nurse carefully go over each prescription with you, before taking them home. Make sure that your name is accurately spelled, that your address is correct. Check the name and dose of your medication to make sure it is accurate. Check the number of pills, and the written instructions to make sure they are clear and accurate. Make sure that you are given enough medication to last until your next medication refill appointment. 5. Taking Medication: Take medication as prescribed. When it comes to controlled substances, taking less pills or less frequently than prescribed is permitted and encouraged. Never take more pills than instructed. Never take medication more frequently than prescribed.  6. Inform other Doctors: Always inform, all of your healthcare providers, of all the medications you take. 7. Pain Medication from other Providers: You are not allowed to accept any additional pain medication from any other Doctor or Healthcare provider. There are two exceptions to this rule. (see below) In the event that you require additional pain medication, you are responsible for notifying us, as stated below. 8. Medication Agreement: You are responsible for carefully reading and following our Medication Agreement. This must be signed before receiving any prescriptions from our practice. Safely store a copy of your signed Agreement. Violations to the Agreement will result in no further prescriptions. (Additional copies of our Medication Agreement are available upon request.) 9. Laws, Rules, & Regulations: All patients are expected to follow all Federal and [employer], [employer], Rules, [employer]. Ignorance of the Laws does not constitute a valid excuse. The use of any illegal substances is prohibited. 10. Adopted CDC guidelines & recommendations: Target dosing levels will be at or below 60 MME/day. Use of benzodiazepines** is not  recommended.  Exceptions: There are only two exceptions to the rule of not receiving pain medications from other Healthcare Providers. 1.  Exception #1 (Emergencies): In the event of an emergency (i.e.: accident requiring emergency care), you are allowed to receive additional pain medication. However, you are responsible for: As soon as you are able, call our office [phone], at any time of the day or night, and leave a message stating your name, the date and nature of the emergency, and the name and dose of the medication prescribed. In the event that your call is answered by a member of our staff, make sure to document and save the date, time, and the name of the person that took your information.  2. Exception #2 (Planned Surgery): In the event that you are scheduled by another doctor or dentist to have any type of surgery or procedure, you are allowed (for a period no longer than 30 days), to receive additional pain medication, for the acute post-op pain. However, in this case, you are responsible for picking up a copy of our "Post-op Pain Management for Surgeons" handout, and giving it to your surgeon or dentist. This document is available at our office, and does not require an appointment to obtain it. Simply go to our office during business hours (Monday-Thursday from 8:00 AM to 4:00 PM) (Friday 8:00 AM to 12:00 Noon) or if you have a scheduled appointment with [place], prior to your surgery, and ask for it by name. In addition, you will need to provide [place] with your name, name of your surgeon, type of surgery, and date of procedure or surgery.  *Opioid medications include: morphine, codeine, oxycodone, oxymorphone, hydrocodone, hydromorphone, meperidine, tramadol, tapentadol, buprenorphine, fentanyl, methadone. **Benzodiazepine medications include: diazepam (Valium), alprazolam (Xanax), clonazepam (Klonopine), lorazepam (Ativan), clorazepate (Tranxene), chlordiazepoxide (Librium), estazolam (Prosom),  oxazepam (Serax), temazepam (Restoril), triazolam (Halcion) (Last updated: 03/19/2017) ____________________________________________________________________________________________    BMI Assessment: Estimated body mass index is 33.64 kg/m as calculated from the following:   Height as of this encounter: _0  (1.626 m).   Weight as of this encounter: 196 lb (88.9 kg).  BMI interpretation table: BMI level Category Range association with higher incidence of chronic pain  <18 kg/m2 Underweight   18.5-24.9 kg/m2 Ideal body weight   25-29.9 kg/m2 Overweight Increased incidence by 20%  30-34.9 kg/m2 Obese (Class I) Increased incidence by 68%  35-39.9 kg/m2 Severe obesity (Class II) Increased incidence by 136%  >40 kg/m2 Extreme obesity (Class III) Increased incidence by 254%   Patient's current BMI Ideal Body weight  Body mass index is 33.64 kg/m. Ideal body weight: 54.7 kg (120 lb 9.5 oz) Adjusted ideal body weight: 68.4 kg (150 lb 12.1 oz)   BMI Readings from Last 4 Encounters:  01/25/18 33.64 kg/m  10/20/17 34.11 kg/m  10/13/17 34.18 kg/m  09/29/17 34.54 kg/m   Wt Readings from Last 4 Encounters:  01/25/18 196 lb (88.9 kg)  10/20/17 198 lb 11.2 oz (90.1 kg)  10/13/17 196 lb (88.9 kg)  09/29/17 195 lb (88.5 kg)

## 2018-01-25 NOTE — Progress Notes (Signed)
Nursing Pain Medication Assessment:  Safety precautions to be maintained throughout the outpatient stay will include: orient to surroundings, keep bed in low position, maintain call bell within reach at all times, provide assistance with transfer out of bed and ambulation.  Medication Inspection Compliance: Pill count conducted under aseptic conditions, in front of the patient. Neither the pills nor the bottle was removed from the patient's sight at any time. Once count was completed pills were immediately returned to the patient in their original bottle.  Medication: Oxycodone IR Pill/Patch Count: 2 of 90 pills remain Pill/Patch Appearance: Markings consistent with prescribed medication Bottle Appearance: Standard pharmacy container. Clearly labeled. Filled Date: 10 / 26 / 2019 Last Medication intake:  Today

## 2018-01-25 NOTE — Progress Notes (Signed)
Patient's Name: Stephanie Short  MRN: [id number]  Referring Provider: Kirk Ruths, MD  DOB: February 06, 1950  PCP: Kirk Ruths, MD  DOS: 01/25/2018  Note by: Vevelyn Francois NP  Service setting: Ambulatory outpatient  Specialty: Interventional Pain Management  Location: [facility] (AMB) Pain Management Facility    Patient type: Established    Primary Reason(s) for Visit: Encounter for prescription drug management. (Level of risk: moderate)  CC: Back Pain  HPI  Ms. Tesmer is a 68 y.o. year old, female patient, who comes today for a medication management evaluation. She has HYPERLIPIDEMIA-MIXED; AORTIC ATHEROSCLEROSIS; Endometrial polyp; Chronic low back pain (Location of Primary Source of Pain) (Bilateral) (R>L); Lumbar spondylosis; Failed back surgical syndrome; Lumbar facet syndrome (Bilateral) (R>L); Long term current use of opiate analgesic; Long term prescription opiate use; Opiate use (22.5 MME/Day); Encounter for therapeutic drug level monitoring; Encounter for chronic pain management; Airway hyperreactivity; Atherosclerosis of abdominal aorta ([place]); Type 2 diabetes mellitus with hyperglycemia, without long-term current use of insulin ([place]); Benign essential hypertension; Cannot sleep; Major depression in remission ([place]); Burning or prickling sensation; Hemorrhage, postmenopausal; Pure hypercholesterolemia; Osteopenia; Chronic hip pain(2) (Bilateral) (L>R); Greater trochanteric bursitis of hips (Bilateral); Chronic sacroiliac joint pain (Bilateral); Osteoarthritis of hips (Bilateral); Pain management; Chronic pain of lower extremity  (Bilateral) (L>R); Postmenopausal osteoporosis; Chronic cervical radicular pain; Neurogenic pain; Chronic kidney disease; Vitamin B12 deficiency; Problem with medical care compliance; Poor historian; Asthma; Chronic neck pain (3) (B) (R>L); Monoclonal gammopathy; Chronic pain syndrome; Chronic left shoulder pain; DDD (degenerative disc disease), cervical;  Healthcare maintenance; COPD (chronic obstructive pulmonary disease) ([place]); Acute diastolic CHF (congestive heart failure) ([place]); Chronic diastolic heart failure ([place]); Hyperlipidemia associated with type 2 diabetes mellitus ([place]); Hypertension associated with chronic kidney disease due to type 2 diabetes mellitus ([place]); Chronic respiratory failure with hypoxia ([place]); Obstructive sleep apnea syndrome; and Secondary pulmonary arterial hypertension (HCC) on their problem list. Her primarily concern today is the Back Pain  Pain Assessment: Location: Lower, Right, Left Back(back and neck) Radiating: Denies Onset: More than a month ago Duration: Chronic pain Quality: Aching, Burning, Throbbing Severity: 2 /10 (subjective, self-reported pain score)  Note: Reported level is compatible with observation.                          Effect on ADL: limits my daily activities Timing: Constant Modifying factors: medications, icey hot BP: 111/69  HR: (!) 103  Ms. Oriley was last scheduled for an appointment on 10/13/2017 for medication management. During today's appointment we reviewed Ms. Elenes's chronic pain status, as well as her outpatient medication regimen. She admits that she does not have scarcodsis. She was told that she has a "hole" in her lung.  She is going to undergo a new treatment at [facility]. She is under increased stress secondary to her sister had a MI.  She continues to be very concerned because she has not gotten a definitive diagnosis.  The patient  reports no history of drug use. Her body mass index is 33.64 kg/m.  Further details on both, my assessment(s), as well as the proposed treatment plan, please see below.  Controlled Substance Pharmacotherapy Assessment REMS (Risk Evaluation and Mitigation Strategy)  Analgesic:Oxycodone2.5 mg 3 times daily (oxycodone7.59m per day) MME/day:11.221mday.  BrChauncey FischerRN  01/25/2018  2:16 PM  Sign when Signing  Visit Nursing Pain Medication Assessment:  Safety precautions to be maintained throughout the outpatient stay will include: orient  to surroundings, keep bed in low position, maintain call bell within reach at all times, provide assistance with transfer out of bed and ambulation.  Medication Inspection Compliance: Pill count conducted under aseptic conditions, in front of the patient. Neither the pills nor the bottle was removed from the patient's sight at any time. Once count was completed pills were immediately returned to the patient in their original bottle.  Medication: Oxycodone IR Pill/Patch Count: 2 of 90 pills remain Pill/Patch Appearance: Markings consistent with prescribed medication Bottle Appearance: Standard pharmacy container. Clearly labeled. Filled Date: 10 / 26 / 2019 Last Medication intake:  Today   Pharmacokinetics: Liberation and absorption (onset of action): WNL Distribution (time to peak effect): WNL Metabolism and excretion (duration of action): WNL         Pharmacodynamics: Desired effects: Analgesia: Ms. Hinojosa reports >50% benefit. Functional ability: Patient reports that medication allows her to accomplish basic ADLs Clinically meaningful improvement in function (CMIF): Sustained CMIF goals met Perceived effectiveness: Described as relatively effective, allowing for increase in activities of daily living (ADL) Undesirable effects: Side-effects or Adverse reactions: None reported Monitoring: [place] PMP: Online review of the past 63-monthperiod conducted. Compliant with practice rules and regulations Last UDS on record: Summary  Date Value Ref Range Status  03/31/2017 FINAL  Final    Comment:    ==================================================================== TOXASSURE SELECT 13 (MW) ==================================================================== Test                             Result       Flag       Units Drug Present and Declared for Prescription  Verification   Oxycodone                      >5650        EXPECTED   ng/mg creat    Sources of oxycodone include scheduled prescription medications. Drug Absent but Declared for Prescription Verification   Alprazolam                     Not Detected UNEXPECTED ng/mg creat   Oxymorphone                    Not Detected UNEXPECTED ng/mg creat   Noroxycodone                   Not Detected UNEXPECTED ng/mg creat   Noroxymorphone                 Not Detected UNEXPECTED ng/mg creat    A moderate to large amount of oxycodone is present; the    metabolites oxymorphone, noroxycodone and noroxymorphone are not    present. This is an atypical result. Although patients with    unusual metabolic profiles exist, they are rare. Review of    previous drug screen results or collection of a urine sample    several hours after a WITNESSED dose of the drug may help to    clarify the subject's ability to produce metabolite. ==================================================================== Test                      Result    Flag   Units      Ref Range   Creatinine              177  mg/dL      >=20 ==================================================================== Declared Medications:  The flagging and interpretation on this report are based on the  following declared medications.  Unexpected results may arise from  inaccuracies in the declared medications.  **Note: The testing scope of this panel includes these medications:  Alprazolam  Oxycodone  **Note: The testing scope of this panel does not include following  reported medications:  Acetaminophen  Albuterol  Aspirin  Cyanocobalamin  Glimepiride  Ondansetron  Ranitidine  Valsartan ==================================================================== For clinical consultation, please call [phone]. ====================================================================    UDS interpretation: Compliant          Medication  Assessment Form: Reviewed. Patient indicates being compliant with therapy Treatment compliance: Compliant Risk Assessment Profile: Aberrant behavior: See prior evaluations. None observed or detected today Comorbid factors increasing risk of overdose: See prior notes. No additional risks detected today Opioid risk tool (ORT) (Total Score): 0 Personal History of Substance Abuse (SUD-Substance use disorder):  Alcohol: Negative  Illegal Drugs: Negative  Rx Drugs: Negative  ORT Risk Level calculation: Low Risk Risk of substance use disorder (SUD): Low Opioid Risk Tool - 01/25/18 1426      Family History of Substance Abuse   Alcohol  Negative    Illegal Drugs  Negative    Rx Drugs  Negative      Personal History of Substance Abuse   Alcohol  Negative    Illegal Drugs  Negative    Rx Drugs  Negative      Age   Age between 63-45 years   No      History of Preadolescent Sexual Abuse   History of Preadolescent Sexual Abuse  Negative or Female      Psychological Disease   Psychological Disease  Negative    Depression  Negative      Total Score   Opioid Risk Tool Scoring  0    Opioid Risk Interpretation  Low Risk      ORT Scoring interpretation table:  Score <3 = Low Risk for SUD  Score between 4-7 = Moderate Risk for SUD  Score >8 = High Risk for Opioid Abuse   Risk Mitigation Strategies:  Patient Counseling: Covered Patient-Prescriber Agreement (PPA): Present and active  Notification to other healthcare providers: Done  Pharmacologic Plan: No change in therapy, at this time.             Laboratory Chemistry  Inflammation Markers (CRP: Acute Phase) (ESR: Chronic Phase) Lab Results  Component Value Date   CRP 0.6 04/25/2015   ESRSEDRATE 7 04/25/2015                         Rheumatology Markers Lab Results  Component Value Date   ANA Negative 08/14/2017                        Renal Function Markers Lab Results  Component Value Date   BUN 47 (H) 08/17/2017    CREATININE 1.12 (H) 08/17/2017   GFRAA 58 (L) 08/17/2017   GFRNONAA 50 (L) 08/17/2017                             Hepatic Function Markers Lab Results  Component Value Date   AST 20 08/13/2017   ALT 13 08/13/2017   ALBUMIN 3.9 08/13/2017   ALKPHOS 106 08/13/2017   LIPASE 24 12/01/2014  Electrolytes Lab Results  Component Value Date   NA 141 08/17/2017   K 4.1 08/17/2017   CL 100 08/17/2017   CALCIUM 9.3 08/17/2017   MG 1.9 04/25/2015                        Neuropathy Markers Lab Results  Component Value Date   VITAMINB12 178 (L) 04/25/2015   HGBA1C 7.7 (H) 08/19/2017   HIV Non Reactive 08/13/2017                        CNS Tests No results found for: COLORCSF, APPEARCSF, RBCCOUNTCSF, WBCCSF, POLYSCSF, LYMPHSCSF, EOSCSF, PROTEINCSF, GLUCCSF, JCVIRUS, CSFOLI, IGGCSF                      Bone Pathology Markers Lab Results  Component Value Date   [id number] 55 04/25/2015   [id number] 55 04/25/2015   [id number] <10 04/25/2015                         Coagulation Parameters Lab Results  Component Value Date   INR 1.0 09/16/2012   LABPROT 13.3 09/16/2012   PLT 203 08/16/2017   DDIMER  02/06/2009    0.30        AT THE INHOUSE ESTABLISHED CUTOFF VALUE OF 0.48 ug/mL FEU, THIS ASSAY HAS BEEN DOCUMENTED IN THE LITERATURE TO HAVE A SENSITIVITY AND NEGATIVE PREDICTIVE VALUE OF AT LEAST 98 TO 99%.  THE TEST RESULT SHOULD BE CORRELATED WITH AN ASSESSMENT OF THE CLINICAL PROBABILITY OF DVT / VTE.                        Cardiovascular Markers Lab Results  Component Value Date   BNP 61.0 08/17/2017   CKTOTAL 56 01/10/2013   CKMB < 0.5 (L) 01/10/2013   TROPONINI <0.03 08/14/2017   HGB 14.0 08/16/2017   HCT 41.4 08/16/2017                         CA Markers No results found for: CEA, CA125, LABCA2                      Note: Lab results reviewed.  Recent Diagnostic Imaging Results  MM 3D SCREEN BREAST BILATERAL CLINICAL DATA:   Screening.  EXAM: DIGITAL SCREENING BILATERAL MAMMOGRAM WITH TOMO AND CAD  COMPARISON:  Previous exam(s).  ACR Breast Density Category b: There are scattered areas of fibroglandular density.  FINDINGS: There are no findings suspicious for malignancy. Images were processed with CAD.  IMPRESSION: No mammographic evidence of malignancy. A result letter of this screening mammogram will be mailed directly to the patient.  RECOMMENDATION: Screening mammogram in one year. (Code:[id number])  BI-RADS CATEGORY  1: Negative.  Electronically Signed   By: Franki Cabot M.D.   On: 11/13/2017 09:13  Complexity Note: Imaging results reviewed. Results shared with Ms. Greif, using Layman's terms.                         Meds   Current Outpatient Medications:  .  acetaminophen (TYLENOL) 325 MG tablet, Take 2 tablets (650 mg total) by mouth every 6 (six) hours as needed for mild pain or headache. (Patient taking differently: Take 500 mg by mouth every 6 (six) hours as needed for mild pain or headache. ),  Disp: 30 tablet, Rfl: 0 .  albuterol (PROVENTIL HFA;VENTOLIN HFA) 108 (90 Base) MCG/ACT inhaler, Inhale 2 puffs into the lungs every 6 (six) hours as needed for wheezing or shortness of breath., Disp: , Rfl:  .  aspirin EC 81 MG tablet, Take 81 mg by mouth at bedtime., Disp: , Rfl:  .  Blood Glucose Monitoring Suppl (FIFTY50 GLUCOSE METER 2.0) w/Device KIT, Use as directed. Dx E11.22 ONE TOUCH, Disp: , Rfl:  .  carvedilol (COREG) 6.25 MG tablet, Take 6.25 mg by mouth 2 (two) times daily with a meal., Disp: , Rfl:  .  felodipine (PLENDIL) 5 MG 24 hr tablet, Take 1 tablet (5 mg total) by mouth every morning. (Patient taking differently: Take 10 mg by mouth daily. ), Disp: 30 tablet, Rfl: 2 .  furosemide (LASIX) 40 MG tablet, Take 1 tablet (40 mg total) by mouth daily., Disp: 30 tablet, Rfl: 2 .  glimepiride (AMARYL) 2 MG tablet, Take 2 mg by mouth daily with breakfast., Disp: , Rfl:  .   ipratropium-albuterol (DUONEB) 0.5-2.5 (3) MG/3ML SOLN, Take 3 mLs by nebulization every 6 (six) hours as needed (wheezing, shortness of breath). (Patient taking differently: Take 3 mLs by nebulization 3 (three) times daily. ), Disp: 360 mL, Rfl: 1 .  ondansetron (ZOFRAN ODT) 4 MG disintegrating tablet, Allow 1-2 tablets to dissolve in your mouth every 8 hours as needed for nausea/vomiting, Disp: 30 tablet, Rfl: 0 .  ONE TOUCH ULTRA TEST test strip, USE TO CHECK BLOOD SUGAR 1-2 TIMES DAILY AS DIRECTED. DX E11.22., Disp: , Rfl: 5 .  potassium chloride 20 MEQ TBCR, Take 20 mEq by mouth daily. While taking lasix, Disp: 30 tablet, Rfl: 2 .  sitaGLIPtin (JANUVIA) 100 MG tablet, Take 100 mg by mouth daily., Disp: , Rfl:  .  umeclidinium-vilanterol (ANORO ELLIPTA) 62.5-25 MCG/INH AEPB, Inhale 1 puff into the lungs daily. , Disp: , Rfl:  .  vitamin B-12 (CYANOCOBALAMIN) 1000 MCG tablet, Take 1 tablet (1,000 mcg total) by mouth daily., Disp: 30 tablet, Rfl: 1 .  [START ON 02/24/2018] oxyCODONE (OXY IR/ROXICODONE) 5 MG immediate release tablet, Take 0.5 tablets (2.5 mg total) by mouth every 4 (four) hours as needed for severe pain., Disp: 90 tablet, Rfl: 0 .  oxyCODONE (OXY IR/ROXICODONE) 5 MG immediate release tablet, Take 0.5 tablets (2.5 mg total) by mouth every 4 (four) hours as needed for severe pain., Disp: 90 tablet, Rfl: 0  ROS  Constitutional: Denies any fever or chills Gastrointestinal: No reported hemesis, hematochezia, vomiting, or acute GI distress Musculoskeletal: Denies any acute onset joint swelling, redness, loss of ROM, or weakness Neurological: No reported episodes of acute onset apraxia, aphasia, dysarthria, agnosia, amnesia, paralysis, loss of coordination, or loss of consciousness  Allergies  Ms. Linney is allergic to codeine; dilaudid [hydromorphone hcl]; tramadol; gabapentin; hydrocodone-acetaminophen; oxycodone-acetaminophen; promethazine; and scallops [shellfish allergy].  PFSH   Drug: Ms. Ullman  reports no history of drug use. Alcohol:  reports current alcohol use. Tobacco:  reports that she quit smoking about 14 years ago. Her smoking use included cigarettes. She has a 20.00 pack-year smoking history. She has never used smokeless tobacco. Medical:  has a past medical history of Asthma, CHF (congestive heart failure) ([place]), Depression, Diabetes mellitus, Hepatitis C, Hypertension, Insomnia, and Oxygen dependent. Surgical: Ms. Vanzee  has a past surgical history that includes Back surgery (2013); Hysteroscopy w/D&C (N/A, 08/18/2014); Knee arthroscopy (Right); Colonoscopy w/ polypectomy; Colonoscopy with propofol (N/A, 03/13/2015); and RIGHT/LEFT HEART CATH AND CORONARY ANGIOGRAPHY (  N/A, 09/29/2017). Family: family history includes Colon cancer in an other family member; Coronary artery disease (age of onset: 53) in her brother; Coronary artery disease (age of onset: 48) in her mother; Diabetes in her father and another family member; Heart attack in her brother; Heart disease in her father; Hypertension in an other family member.  Constitutional Exam  General appearance: Well nourished, well developed, and well hydrated. In no apparent acute distress Vitals:   01/25/18 1416  BP: 111/69  Pulse: (!) 103  Temp: 97.6 F (36.4 C)  Weight: 196 lb (88.9 kg)  Height: _0  (1.626 m)  Psych/Mental status: Alert, oriented x 3 (person, place, & time)       Eyes: PERLA Respiratory: Oxygen-dependent COPD  Cervical Spine Area Exam  Skin & Axial Inspection: No masses, redness, edema, swelling, or associated skin lesions Alignment: Symmetrical Functional ROM: Unrestricted ROM      Stability: No instability detected Muscle Tone/Strength: Functionally intact. No obvious neuro-muscular anomalies detected. Sensory (Neurological): Unimpaired Palpation: No palpable anomalies              Upper Extremity (UE) Exam    Side: Right upper extremity  Side: Left upper extremity  Skin &  Extremity Inspection: Skin color, temperature, and hair growth are WNL. No peripheral edema or cyanosis. No masses, redness, swelling, asymmetry, or associated skin lesions. No contractures.  Skin & Extremity Inspection: Skin color, temperature, and hair growth are WNL. No peripheral edema or cyanosis. No masses, redness, swelling, asymmetry, or associated skin lesions. No contractures.  Functional ROM: Unrestricted ROM          Functional ROM: Unrestricted ROM          Muscle Tone/Strength: Functionally intact. No obvious neuro-muscular anomalies detected.  Muscle Tone/Strength: Functionally intact. No obvious neuro-muscular anomalies detected.  Sensory (Neurological): Unimpaired          Sensory (Neurological): Unimpaired          Palpation: No palpable anomalies              Palpation: No palpable anomalies               Thoracic Spine Area Exam  Skin & Axial Inspection: No masses, redness, or swelling Alignment: Symmetrical Functional ROM: Unrestricted ROM Stability: No instability detected Muscle Tone/Strength: Functionally intact. No obvious neuro-muscular anomalies detected. Sensory (Neurological): Unimpaired Muscle strength & Tone: No palpable anomalies  Lumbar Spine Area Exam  Skin & Axial Inspection: No masses, redness, or swelling Alignment: Symmetrical Functional ROM: Unrestricted ROM       Stability: No instability detected Muscle Tone/Strength: Functionally intact. No obvious neuro-muscular anomalies detected. Sensory (Neurological): Unimpaired Palpation: No palpable anomalies       Provocative Tests: Hyperextension/rotation test: deferred today       Lumbar quadrant test (Kemp's test): deferred today       Lateral bending test: deferred today         Gait & Posture Assessment  Ambulation: Unassisted Gait: Relatively normal for age and body habitus Posture: WNL   Lower Extremity Exam    Side: Right lower extremity  Side: Left lower extremity  Stability: No  instability observed          Stability: No instability observed          Skin & Extremity Inspection: Skin color, temperature, and hair growth are WNL. No peripheral edema or cyanosis. No masses, redness, swelling, asymmetry, or associated skin lesions. No contractures.  Skin & Extremity Inspection:  Skin color, temperature, and hair growth are WNL. No peripheral edema or cyanosis. No masses, redness, swelling, asymmetry, or associated skin lesions. No contractures.  Functional ROM: Unrestricted ROM                  Functional ROM: Unrestricted ROM                  Muscle Tone/Strength: Functionally intact. No obvious neuro-muscular anomalies detected.  Muscle Tone/Strength: Functionally intact. No obvious neuro-muscular anomalies detected.  Sensory (Neurological): Unimpaired        Sensory (Neurological): Unimpaired        Palpation: No palpable anomalies  Palpation: No palpable anomalies   Assessment  Primary Diagnosis & Pertinent Problem List: The encounter diagnosis was Chronic pain syndrome.  Status Diagnosis  Controlled Controlled Controlled 1. Chronic pain syndrome     Problems updated and reviewed during this visit: Problem  Chronic Respiratory Failure With Hypoxia (Hcc)  Obstructive Sleep Apnea Syndrome   Mostly hypoxia but AHI of 11.5 sleep study August 2019 placed on CPAP pressure of 19.  She spent 71% of the time with a sat less than 90%.   Secondary Pulmonary Arterial Hypertension (Hcc)   Referred by Wallene Huh concern for secondary pulmonary hypertension the setting of COPD.  She has chronic hypoxemic respiratory failure. Pulmonary function test June 2019 FVC 1.75 L 77%.  FEV1 1.11 L 61% predicted, ratio of 64 with FEF 25 of only 18%.  Vital capacity 77%, total lung capacity 71%, residual volume 84%.  DLCO low at 30%. Sleep study had an AHI of 11.5.  She was placed on 19 for CPAP.  She was desatted below 90 for 71% of the time. Cardiac catheterization September 29, 2017  both left and right there is a mid LAD 50% lesion in the ostial diagonal of 75%. The right heart catheterization has some confusing numbers in the reports.  I can see all the numbers that were obtained I am assuming the wedges first and then they pulled back to the pulmonary artery but there is a huge variety of numbers in the number that they chose and the ultimate report makes no sense at all.  The recordings show 79/36 mean of 52, next reading is 72/32 mean of 46 and the next reading 79/29 with a mean of 30 which is not possible is what they picked as the report final choice.  I would guess from the other numbers with and are the pressure being 82/25 that either the 79/36 or the 72/32 with a mean PA P somewhere between 46 and 52 seems more correct so I am not sure what these numbers were with 36/22 and 54/32 if these are wedge pressures they got an RA pressure of 19 and a wedge pressure that they called 22 but I do not see those ever recorded in their numbers.  LV was 135/17 aortic sat was 81 PA sat 55 hemoglobin was not given they came out with a cardiac output of 3.7 to the pulmonary vascular resistance does not make sense given that I do not know which of the pulmonary pressures is correct. Using MPAP of 50 and LVEDP of 17 with CO of 3.7 the PVR is about 9 WU.    Plan of Care  Pharmacotherapy (Medications Ordered): Meds ordered this encounter  Medications  . oxyCODONE (OXY IR/ROXICODONE) 5 MG immediate release tablet    Sig: Take 0.5 tablets (2.5 mg total) by mouth every 4 (four) hours as  needed for severe pain.    Dispense:  90 tablet    Refill:  0    Do not place this medication, or any other prescription from our practice, on "Automatic Refill". Patient may have prescription filled one day early if pharmacy is closed on scheduled refill date.    Order Specific Question:   Supervising Provider    Answer:   Milinda Pointer [phone]  . oxyCODONE (OXY IR/ROXICODONE) 5 MG immediate release  tablet    Sig: Take 0.5 tablets (2.5 mg total) by mouth every 4 (four) hours as needed for severe pain.    Dispense:  90 tablet    Refill:  0    Do not place this medication, or any other prescription from our practice, on "Automatic Refill". Patient may have prescription filled one day early if pharmacy is closed on scheduled refill date.    Order Specific Question:   Supervising Provider    Answer:   Milinda Pointer [phone]  . vitamin B-12 (CYANOCOBALAMIN) 1000 MCG tablet    Sig: Take 1 tablet (1,000 mcg total) by mouth daily.    Dispense:  30 tablet    Refill:  1    Order Specific Question:   Supervising Provider    Answer:   Milinda Pointer [phone]  . ondansetron (ZOFRAN ODT) 4 MG disintegrating tablet    Sig: Allow 1-2 tablets to dissolve in your mouth every 8 hours as needed for nausea/vomiting    Dispense:  30 tablet    Refill:  0    Order Specific Question:   Supervising Provider    Answer:   Milinda Pointer [phone]   New Prescriptions   No medications on file   Medications administered today: Vevelyn Pat had no medications administered during this visit. Lab-work, procedure(s), and/or referral(s): No orders of the defined types were placed in this encounter.  Imaging and/or referral(s): None   Interventional therapies: Planned, scheduled, and/or pending:   Not at this time.   Considering:   Repeat diagnostic lumbar facet block under fluoroscopic guidance and IV sedation. Possible bilateral lumbar facet radiofrequency ablation Possible diagnostic bilateral sacroiliac joint block under fluoroscopic guidance, with or without sedation.  Possible diagnostic bilateral intra-articular hip joint injection under fluoroscopic guidance, with or without sedation.    Palliative PRN treatment(s):   For the low back pain, bilateral diagnostic lumbar facet block + diagnostic bilateral sacroiliac joint injection under fluoroscopic guidance with IV sedation.   For the hip pain, diagnostic intra-articular hip joint injection under fluoroscopic guidance, with or without sedation.     Provider-requested follow-up: Return in about 2 months (around 03/26/2018) for MedMgmt.  Future Appointments  Date Time Provider [place]  01/27/2018 11:30 AM [facility] None  01/29/2018 11:30 AM [facility] None  02/01/2018 11:30 AM [facility] None  02/03/2018 11:30 AM [facility] None  02/05/2018 11:30 AM [facility] None  02/08/2018 11:30 AM [facility] None  02/10/2018 11:30 AM [facility] None  02/12/2018 11:30 AM [facility] None  02/15/2018 11:30 AM [facility] None  02/17/2018 11:30 AM [facility] None  02/19/2018 11:30 AM [facility] None  02/22/2018 11:30 AM [facility] None  02/24/2018 11:30 AM [facility] None  03/25/2018  1:45 PM Vevelyn Francois,  NP [facility] None   Primary Care Physician: Kirk Ruths, MD Location: [facility] Note by: Vevelyn Francois NP Date: 01/25/2018; Time: 3:17 PM  Pain Score Disclaimer: We use the NRS-11 scale. This is a self-reported, subjective measurement of pain severity with only modest accuracy. It is used primarily to identify changes within a particular patient. It must be understood that outpatient pain scales are significantly less accurate that those used for research, where they can be applied under ideal controlled circumstances with minimal exposure to variables. In reality, the score is likely to be a combination of pain intensity and pain affect, where pain affect describes the degree of emotional arousal or changes in action readiness caused by the  sensory experience of pain. Factors such as social and work situation, setting, emotional state, anxiety levels, expectation, and prior pain experience may influence pain perception and show large inter-individual differences that may also be affected by time variables.  Patient instructions provided during this appointment: Patient Instructions   ____________________________________________________________________________________________  Medication Rules  Purpose: To inform patients, and their family members, of our rules and regulations.  Applies to: All patients receiving prescriptions (written or electronic).  Pharmacy of record: Pharmacy where electronic prescriptions will be sent. If written prescriptions are taken to a different pharmacy, please inform the nursing staff. The pharmacy listed in the electronic medical record should be the one where you would like electronic prescriptions to be sent.  Electronic prescriptions: In compliance with the [place] (STOP) Act of 2017 (Session Lanny Cramp [phone]), effective January 20, 2018, all controlled substances must be electronically prescribed. Calling prescriptions to the pharmacy will cease to exist.  Prescription refills: Only during scheduled appointments. Applies to all prescriptions.  NOTE: The following applies primarily to controlled substances (Opioid* Pain Medications).   Patient's responsibilities: 1. Pain Pills: Bring all pain pills to every appointment (except for procedure appointments). 2. Pill Bottles: Bring pills in original pharmacy bottle. Always bring the newest bottle. Bring bottle, even if empty. 3. Medication refills: You are responsible for knowing and keeping track of what medications you take and those you need refilled. The day before your appointment: write a list of all prescriptions that need to be refilled. The day of the appointment: give the list to the admitting  nurse. Prescriptions will be written only during appointments. If you forget a medication: it will not be "Called in", "Faxed", or "electronically sent". You will need to get another appointment to get these prescribed. No early refills. Do not call asking to have your prescription filled early. 4. Prescription Accuracy: You are responsible for carefully inspecting your prescriptions before leaving our office. Have the discharge nurse carefully go over each prescription with you, before taking them home. Make sure that your name is accurately spelled, that your address is correct. Check the name and dose of your medication to make sure it is accurate. Check the number of pills, and the written instructions to make sure they are clear and accurate. Make sure that you are given enough medication to last until your next medication refill appointment. 5. Taking Medication: Take medication as prescribed. When it comes to controlled substances, taking less pills or less frequently than prescribed is permitted and encouraged. Never take more pills than instructed. Never take medication more frequently than prescribed.  6. Inform other Doctors: Always inform, all of your healthcare providers, of all the medications you take. 7. Pain Medication from other Providers: You are  not allowed to accept any additional pain medication from any other Doctor or Healthcare provider. There are two exceptions to this rule. (see below) In the event that you require additional pain medication, you are responsible for notifying us, as stated below. 8. Medication Agreement: You are responsible for carefully reading and following our Medication Agreement. This must be signed before receiving any prescriptions from our practice. Safely store a copy of your signed Agreement. Violations to the Agreement will result in no further prescriptions. (Additional copies of our Medication Agreement are available upon request.) 9. Laws, Rules, &  Regulations: All patients are expected to follow all Federal and [employer], [employer], Rules, [employer]. Ignorance of the Laws does not constitute a valid excuse. The use of any illegal substances is prohibited. 10. Adopted CDC guidelines & recommendations: Target dosing levels will be at or below 60 MME/day. Use of benzodiazepines** is not recommended.  Exceptions: There are only two exceptions to the rule of not receiving pain medications from other Healthcare Providers. 1. Exception #1 (Emergencies): In the event of an emergency (i.e.: accident requiring emergency care), you are allowed to receive additional pain medication. However, you are responsible for: As soon as you are able, call our office [phone], at any time of the day or night, and leave a message stating your name, the date and nature of the emergency, and the name and dose of the medication prescribed. In the event that your call is answered by a member of our staff, make sure to document and save the date, time, and the name of the person that took your information.  2. Exception #2 (Planned Surgery): In the event that you are scheduled by another doctor or dentist to have any type of surgery or procedure, you are allowed (for a period no longer than 30 days), to receive additional pain medication, for the acute post-op pain. However, in this case, you are responsible for picking up a copy of our "Post-op Pain Management for Surgeons" handout, and giving it to your surgeon or dentist. This document is available at our office, and does not require an appointment to obtain it. Simply go to our office during business hours (Monday-Thursday from 8:00 AM to 4:00 PM) (Friday 8:00 AM to 12:00 Noon) or if you have a scheduled appointment with [place], prior to your surgery, and ask for it by name. In addition, you will need to provide [place] with your name, name of your surgeon, type of surgery, and date of procedure or surgery.  *Opioid  medications include: morphine, codeine, oxycodone, oxymorphone, hydrocodone, hydromorphone, meperidine, tramadol, tapentadol, buprenorphine, fentanyl, methadone. **Benzodiazepine medications include: diazepam (Valium), alprazolam (Xanax), clonazepam (Klonopine), lorazepam (Ativan), clorazepate (Tranxene), chlordiazepoxide (Librium), estazolam (Prosom), oxazepam (Serax), temazepam (Restoril), triazolam (Halcion) (Last updated: 03/19/2017) ____________________________________________________________________________________________    BMI Assessment: Estimated body mass index is 33.64 kg/m as calculated from the following:   Height as of this encounter: _0  (1.626 m).   Weight as of this encounter: 196 lb (88.9 kg).  BMI interpretation table: BMI level Category Range association with higher incidence of chronic pain  <18 kg/m2 Underweight   18.5-24.9 kg/m2 Ideal body weight   25-29.9 kg/m2 Overweight Increased incidence by 20%  30-34.9 kg/m2 Obese (Class I) Increased incidence by 68%  35-39.9 kg/m2 Severe obesity (Class II) Increased incidence by 136%  >40 kg/m2 Extreme obesity (Class III) Increased incidence by 254%   Patient's current BMI Ideal Body weight  Body mass index is 33.64 kg/m. Ideal body  weight: 54.7 kg (120 lb 9.5 oz) Adjusted ideal body weight: 68.4 kg (150 lb 12.1 oz)   BMI Readings from Last 4 Encounters:  01/25/18 33.64 kg/m  10/20/17 34.11 kg/m  10/13/17 34.18 kg/m  09/29/17 34.54 kg/m   Wt Readings from Last 4 Encounters:  01/25/18 196 lb (88.9 kg)  10/20/17 198 lb 11.2 oz (90.1 kg)  10/13/17 196 lb (88.9 kg)  09/29/17 195 lb (88.5 kg)

## 2018-01-25 NOTE — Progress Notes (Signed)
Daily Session Note  Patient Details  Name: Stephanie Short MRN: [id number] Date of Birth: 1950-12-11 Referring Provider:     Pulmonary Rehab from 10/20/2017 in [facility]  Referring Provider  [name]      Encounter Date: 01/25/2018  Check In:      Social History   Tobacco Use  Smoking Status Former Smoker  . Packs/day: 1.00  . Years: 20.00  . Pack years: 20.00  . Types: Cigarettes  . Last attempt to quit: 01/21/2004  . Years since quitting: 14.0  Smokeless Tobacco Never Used    Goals Met:  Proper associated with RPD/PD & O2 Sat Independence with exercise equipment Exercise tolerated well Strength training completed today  Goals Unmet:  Not Applicable  Comments: Pt able to follow exercise prescription today without complaint.  Will continue to monitor for progression.    Dr. Emily Filbert is Medical Director for [place] and [facility].

## 2018-01-27 ENCOUNTER — Encounter: Payer: 59 | Admitting: *Deleted

## 2018-01-27 DIAGNOSIS — J449 Chronic obstructive pulmonary disease, unspecified: Secondary | ICD-10-CM | POA: Diagnosis not present

## 2018-01-27 NOTE — Progress Notes (Signed)
Daily Session Note  Patient Details  Name: Stephanie Short MRN: [id number] Date of Birth: Jun 28, 1950 Referring Provider:     Pulmonary Rehab from 10/20/2017 in [facility]  Referring Provider  [name]      Encounter Date: 01/27/2018  Check In: Session Check In - 01/27/18 1128      Check-In   Supervising physician immediately available to respond to emergencies  [facility] immediately available ER MD    Physician(s)  Drs. Joycelyn Rua    Location  [facility]    Staff Present  Renita Papa, RN BSN;Marsh Heckler Luan Pulling, MA, RCEP, CCRP, Exercise Physiologist;Joseph Tessie Fass RCP,RRT,BSRT    Medication changes reported      No    Fall or balance concerns reported     No    Warm-up and Cool-down  Performed as group-led instruction    Resistance Training Performed  Yes    VAD Patient?  No    PAD/SET Patient?  No      Pain Assessment   Currently in Pain?  No/denies          Social History   Tobacco Use  Smoking Status Former Smoker  . Packs/day: 1.00  . Years: 20.00  . Pack years: 20.00  . Types: Cigarettes  . Last attempt to quit: 01/21/2004  . Years since quitting: 14.0  Smokeless Tobacco Never Used    Goals Met:  Proper associated with RPD/PD & O2 Sat Independence with exercise equipment Using PLB without cueing & demonstrates good technique Exercise tolerated well Personal goals reviewed No report of cardiac concerns or symptoms Strength training completed today  Goals Unmet:  Not Applicable  Comments: Pt able to follow exercise prescription today without complaint.  Will continue to monitor for progression.    Dr. Emily Filbert is Medical Director for [place] and [facility].

## 2018-02-01 DIAGNOSIS — J449 Chronic obstructive pulmonary disease, unspecified: Secondary | ICD-10-CM

## 2018-02-01 NOTE — Progress Notes (Signed)
Pulmonary Individual Treatment Plan  Patient Details  Name: Stephanie Short MRN: [id number] Date of Birth: 13-Jan-1951 Referring Provider:     Pulmonary Rehab from 10/20/2017 in [facility] Cardiac and Pulmonary Rehab  Referring Provider  [name]      Initial Encounter Date:    Pulmonary Rehab from 10/20/2017 in [facility] Cardiac and Pulmonary Rehab  Date  10/20/17      Visit Diagnosis: Chronic obstructive pulmonary disease, unspecified COPD type ([place])  Patient's Home Medications on Admission:  Current Outpatient Medications:  .  acetaminophen (TYLENOL) 325 MG tablet, Take 2 tablets (650 mg total) by mouth every 6 (six) hours as needed for mild pain or headache. (Patient taking differently: Take 500 mg by mouth every 6 (six) hours as needed for mild pain or headache. ), Disp: 30 tablet, Rfl: 0 .  albuterol (PROVENTIL HFA;VENTOLIN HFA) 108 (90 Base) MCG/ACT inhaler, Inhale 2 puffs into the lungs every 6 (six) hours as needed for wheezing or shortness of breath., Disp: , Rfl:  .  aspirin EC 81 MG tablet, Take 81 mg by mouth at bedtime., Disp: , Rfl:  .  Blood Glucose Monitoring Suppl (FIFTY50 GLUCOSE METER 2.0) w/Device KIT, Use as directed. Dx E11.22 ONE TOUCH, Disp: , Rfl:  .  carvedilol (COREG) 6.25 MG tablet, Take 6.25 mg by mouth 2 (two) times daily with a meal., Disp: , Rfl:  .  felodipine (PLENDIL) 5 MG 24 hr tablet, Take 1 tablet (5 mg total) by mouth every morning. (Patient taking differently: Take 10 mg by mouth daily. ), Disp: 30 tablet, Rfl: 2 .  furosemide (LASIX) 40 MG tablet, Take 1 tablet (40 mg total) by mouth daily., Disp: 30 tablet, Rfl: 2 .  glimepiride (AMARYL) 2 MG tablet, Take 2 mg by mouth daily with breakfast., Disp: , Rfl:  .  ipratropium-albuterol (DUONEB) 0.5-2.5 (3) MG/3ML SOLN, Take 3 mLs by nebulization every 6 (six) hours as needed (wheezing, shortness of breath). (Patient taking differently: Take 3 mLs by nebulization 3 (three) times daily. ), Disp: 360 mL, Rfl: 1 .   ondansetron (ZOFRAN ODT) 4 MG disintegrating tablet, Allow 1-2 tablets to dissolve in your mouth every 8 hours as needed for nausea/vomiting, Disp: 30 tablet, Rfl: 0 .  ONE TOUCH ULTRA TEST test strip, USE TO CHECK BLOOD SUGAR 1-2 TIMES DAILY AS DIRECTED. DX E11.22., Disp: , Rfl: 5 .  [START ON 02/24/2018] oxyCODONE (OXY IR/ROXICODONE) 5 MG immediate release tablet, Take 0.5 tablets (2.5 mg total) by mouth every 4 (four) hours as needed for severe pain., Disp: 90 tablet, Rfl: 0 .  oxyCODONE (OXY IR/ROXICODONE) 5 MG immediate release tablet, Take 0.5 tablets (2.5 mg total) by mouth every 4 (four) hours as needed for severe pain., Disp: 90 tablet, Rfl: 0 .  potassium chloride 20 MEQ TBCR, Take 20 mEq by mouth daily. While taking lasix, Disp: 30 tablet, Rfl: 2 .  sitaGLIPtin (JANUVIA) 100 MG tablet, Take 100 mg by mouth daily., Disp: , Rfl:  .  umeclidinium-vilanterol (ANORO ELLIPTA) 62.5-25 MCG/INH AEPB, Inhale 1 puff into the lungs daily. , Disp: , Rfl:  .  vitamin B-12 (CYANOCOBALAMIN) 1000 MCG tablet, Take 1 tablet (1,000 mcg total) by mouth daily., Disp: 30 tablet, Rfl: 1  Past Medical History: Past Medical History:  Diagnosis Date  . Asthma   . CHF (congestive heart failure) ([place])   . Depression   . Diabetes mellitus   . Hepatitis C    Dr. Staci Acosta pt took part in a study,  now cured  . Hypertension   . Insomnia   . Oxygen dependent     Tobacco Use: Social History   Tobacco Use  Smoking Status Former Smoker  . Packs/day: 1.00  . Years: 20.00  . Pack years: 20.00  . Types: Cigarettes  . Last attempt to quit: 01/21/2004  . Years since quitting: 14.0  Smokeless Tobacco Never Used    Labs: Recent Review Scientist, physiological    Labs for ITP Cardiac and Pulmonary Rehab Latest Ref Rng & Units October 01, 202009 02/06/2009 02/07/2009 05/24/2012 08/19/2017   Cholestrol 0 - 200 mg/dL - - 104 ATP III CLASSIFICATION: <200     mg/dL   Desirable 200-239  mg/dL   Borderline High >=240    mg/dL   High 82 -    LDLCALC 0 - 100 mg/dL - - 48 Total Cholesterol/HDL:CHD Risk Coronary Heart Disease Risk Table Men   Women 1/2 Average Risk   3.4   3.3 Average Risk       5.0   4.4 2 X Average Risk   9.6   7.1 3 X Average Risk  23.4   11.0 Use the calculated Patient Ratio above and the CHD Risk Table to determine the patient's CHD Risk. ATP III CLASSIFICATION (LDL): <100     mg/dL   Optimal 100-129  mg/dL   Near or Above Optimal 130-159  mg/dL   Borderline 160-189  mg/dL   High >190     mg/dL   Very High 38 -   HDL 40 - 60 mg/dL - - 45 31(L) -   Trlycerides 0 - 200 mg/dL - - 56 67 -   Hemoglobin A1c 4.8 - 5.6 % - 7.4 (NOTE) The ADA recommends the following therapeutic goal for glycemic control related to Hgb A1c measurement: Goal of therapy: <6.5 Hgb A1c  Reference: American Diabetes Association: Clinical Practice Recommendations 2010, Diabetes Care, 2010, 33: (Suppl 1).(H) - - 7.7(H)   HCO3 - 30.6(H) - - - -   TCO2 - 32 - - - -       Pulmonary Assessment Scores: Pulmonary Assessment Scores    Row Name 10/20/17 1135         ADL UCSD   ADL Phase  Entry     SOB Score total  67     Rest  0     Walk  4     Stairs  5     Bath  4     Dress  5     Shop  3       CAT Score   CAT Score  19       mMRC Score   mMRC Score  3        Pulmonary Function Assessment: Pulmonary Function Assessment - 10/20/17 1134      Initial Spirometry Results   FVC%  48 %    FEV1%  55 %    FEV1/FVC Ratio  89.17    Comments  good patient effort      Post Bronchodilator Spirometry Results   FVC%  46.08 %    FEV1%  54.45 %    FEV1/FVC Ratio  92.09    Comments  good patient effort      Breath   Bilateral Breath Sounds  Clear    Shortness of Breath  Yes;Limiting activity;Panic with Shortness of Breath       Exercise Target Goals: Exercise Program Goal: Individual exercise prescription set using results from initial  6 min walk test and THRR while considering  patient's activity barriers and  safety.   Exercise Prescription Goal: Initial exercise prescription builds to 30-45 minutes a day of aerobic activity, 2-3 days per week.  Home exercise guidelines will be given to patient during program as part of exercise prescription that the participant will acknowledge.  Activity Barriers & Risk Stratification:   6 Minute Walk: 6 Minute Walk    Row Name 10/20/17 1239         6 Minute Walk   Phase  Initial     Distance  300 feet     Walk Time  2.75 minutes     # of Rest Breaks  0     MPH  1.23     METS  1.2     RPE  11     Perceived Dyspnea   3     VO2 Peak  4.2     Symptoms  No     Resting HR  92 bpm     Resting BP  114/64     Resting Oxygen Saturation   91 %     Exercise Oxygen Saturation  during 6 min walk  78 %     Max Ex. HR  120 bpm     Max Ex. BP  142/64     2 Minute Post BP  114/60       Interval HR   1 Minute HR  110     2 Minute HR  117     3 Minute HR  120     Interval Heart Rate?  Yes       Interval Oxygen   Interval Oxygen?  Yes     Baseline Oxygen Saturation %  91 %     1 Minute Oxygen Saturation %  84 %     1 Minute Liters of Oxygen  3 L pulsed     2 Minute Oxygen Saturation %  80 %     2 Minute Liters of Oxygen  3 L pulsed     3 Minute Oxygen Saturation %  78 %     3 Minute Liters of Oxygen  3 L pulsed       Oxygen Initial Assessment: Oxygen Initial Assessment - 10/20/17 1133      Home Oxygen   Home Oxygen Device  Home Concentrator;Portable Concentrator;E-Tanks    Sleep Oxygen Prescription  CPAP    Liters per minute  2    Home Exercise Oxygen Prescription  Continuous    Liters per minute  2    Home at Rest Exercise Oxygen Prescription  Continuous    Liters per minute  2    Compliance with Home Oxygen Use  Yes      Initial 6 min Walk   Oxygen Used  Pulsed;Portable Concentrator    Liters per minute  2      Program Oxygen Prescription   Program Oxygen Prescription  Continuous;E-Tanks    Liters per minute  2      Intervention    Short Term Goals  To learn and exhibit compliance with exercise, home and travel O2 prescription;To learn and understand importance of monitoring SPO2 with pulse oximeter and demonstrate accurate use of the pulse oximeter.;To learn and understand importance of maintaining oxygen saturations>88%;To learn and demonstrate proper pursed lip breathing techniques or other breathing techniques.;To learn and demonstrate proper use of respiratory medications    Long  Term Goals  Exhibits  compliance with exercise, home and travel O2 prescription;Verbalizes importance of monitoring SPO2 with pulse oximeter and return demonstration;Maintenance of O2 saturations>88%;Exhibits proper breathing techniques, such as pursed lip breathing or other method taught during program session;Compliance with respiratory medication;Demonstrates proper use of MDI's       Oxygen Re-Evaluation: Oxygen Re-Evaluation    Row Name 10/23/17 1150 11/18/17 1356 11/18/17 1417 12/25/17 1209 01/18/18 1502     Program Oxygen Prescription   Program Oxygen Prescription  -  Continuous;E-Tanks  Continuous;E-Tanks  Continuous;E-Tanks  Continuous;E-Tanks   Liters per minute  -  4  -  4  4   Comments  -  6 liters when walking  6 liters when walking  6 liters when walking  6 liters when walking     Home Oxygen   Home Oxygen Device  -  Home Concentrator;Portable Concentrator;E-Tanks  Home Concentrator;Portable Concentrator;E-Tanks  Home Concentrator;Portable Concentrator;E-Tanks  Home Concentrator;Portable Concentrator;E-Tanks   Sleep Oxygen Prescription  -  CPAP  CPAP  CPAP  CPAP   Liters per minute  -  _0 Home Exercise Oxygen Prescription  -  Continuous  Continuous  Continuous  Continuous   Liters per minute  -  _1 Home at Rest Exercise Oxygen Prescription  -  Continuous  Continuous  Continuous  Continuous   Liters per minute  -  _2 Compliance with Home Oxygen Use  -  Yes  Yes  Yes  Yes     Goals/Expected  Outcomes   Short Term Goals  -  To learn and exhibit compliance with exercise, home and travel O2 prescription;To learn and understand importance of monitoring SPO2 with pulse oximeter and demonstrate accurate use of the pulse oximeter.;To learn and understand importance of maintaining oxygen saturations>88%;To learn and demonstrate proper pursed lip breathing techniques or other breathing techniques.;To learn and demonstrate proper use of respiratory medications  To learn and understand importance of maintaining oxygen saturations>88%;To learn and understand importance of monitoring SPO2 with pulse oximeter and demonstrate accurate use of the pulse oximeter.;To learn and exhibit compliance with exercise, home and travel O2 prescription  To learn and understand importance of maintaining oxygen saturations>88%;To learn and understand importance of monitoring SPO2 with pulse oximeter and demonstrate accurate use of the pulse oximeter.;To learn and exhibit compliance with exercise, home and travel O2 prescription  To learn and understand importance of maintaining oxygen saturations>88%;To learn and understand importance of monitoring SPO2 with pulse oximeter and demonstrate accurate use of the pulse oximeter.;To learn and exhibit compliance with exercise, home and travel O2 prescription   Long  Term Goals  -  Exhibits compliance with exercise, home and travel O2 prescription;Verbalizes importance of monitoring SPO2 with pulse oximeter and return demonstration;Maintenance of O2 saturations>88%;Exhibits proper breathing techniques, such as pursed lip breathing or other method taught during program session;Compliance with respiratory medication;Demonstrates proper use of MDI's  Exhibits compliance with exercise, home and travel O2 prescription;Verbalizes importance of monitoring SPO2 with pulse oximeter and return demonstration;Maintenance of O2 saturations>88%  Exhibits compliance with exercise, home and travel O2  prescription;Verbalizes importance of monitoring SPO2 with pulse oximeter and return demonstration;Maintenance of O2 saturations>88%  Exhibits compliance with exercise, home and travel O2 prescription;Verbalizes importance of monitoring SPO2 with pulse oximeter and return demonstration;Maintenance of O2 saturations>88%   Comments  Reviewed PLB technique with pt.  Talked about how it work and it's  important to maintaining his exercise saturations.    Practiced PLB with patient. Patient understands why PLB is important and to use it when she is short of breath. Stephanie Short states that her doctor wants to put her on a respiratory medication that she has to qualify for and if not she may be looking to get a lung transplant. At rest her oxygen is around 95 percent at home. She needs to be on 6 liters when she is walking. On exertion her oxygen gets in the mid to low 80's but come back to the low 90 range with rest. Reviewed that oxygen saturations should be 88 percent and above. Patient has a pulse oximeter at home to check her oxygen.  Stephanie Short went to see Dr. [name] yesterday and was okayed to increase to 4-5L for oxygen when exerting, but she needs 6L to maintain saturations above 88%. We will send another note if this does not post in progress notes from office visit.  We also talked about using her e-cylinders for home exercise versus her portable concentrator as it does not give her enough flow.   Diaphragmatic and PLB breathing explained and performed with patient. Patient has a better understanding of how to do these exercises to help with breathing performance and relaxation. Patient performed breathing techniques adequately and to practice further at home.  Informed patient that she may be using to little oxygen when she is on her concentrator. Verbalized to patient that she should talk to her pulmonologist to see if she needs to be on more oxygen when at rest. She states she can use 3-4 liters at rest. Patient has come  into class wearing 2-3 liters of oxygen wth an SpO2 of 73 percent. Informed her to wear 4 liters on her home oxygen and to make sure she is 88 percent or above at home.   Goals/Expected Outcomes  Short: Become more profiecient at using PLB.   Long: Become independent at using PLB.  Short: use PLB with exertion. Long: use PLB on exertion proficiently and independently.  Short: Increase oxygen flow at home. Long: Continue to be compliant.   Short: practice PLB and diaphragmatic breathing at home. Long: Use PLB and diaphragmatic breathing independently post [employer].  Short: check oxygen more frequently at home. Long: maintain oxygen above 88 percent at home independently.      Oxygen Discharge (Final Oxygen Re-Evaluation): Oxygen Re-Evaluation - 01/18/18 1502      Program Oxygen Prescription   Program Oxygen Prescription  Continuous;E-Tanks    Liters per minute  4    Comments  6 liters when walking      Home Oxygen   Home Oxygen Device  Home Concentrator;Portable Concentrator;E-Tanks    Sleep Oxygen Prescription  CPAP    Liters per minute  2    Home Exercise Oxygen Prescription  Continuous    Liters per minute  5    Home at Rest Exercise Oxygen Prescription  Continuous    Liters per minute  2    Compliance with Home Oxygen Use  Yes      Goals/Expected Outcomes   Short Term Goals  To learn and understand importance of maintaining oxygen saturations>88%;To learn and understand importance of monitoring SPO2 with pulse oximeter and demonstrate accurate use of the pulse oximeter.;To learn and exhibit compliance with exercise, home and travel O2 prescription    Long  Term Goals  Exhibits compliance with exercise, home and travel O2 prescription;Verbalizes importance of monitoring SPO2 with pulse oximeter  and return demonstration;Maintenance of O2 saturations>88%    Comments  Informed patient that she may be using to little oxygen when she is on her concentrator. Verbalized to patient that she  should talk to her pulmonologist to see if she needs to be on more oxygen when at rest. She states she can use 3-4 liters at rest. Patient has come into class wearing 2-3 liters of oxygen wth an SpO2 of 73 percent. Informed her to wear 4 liters on her home oxygen and to make sure she is 88 percent or above at home.    Goals/Expected Outcomes  Short: check oxygen more frequently at home. Long: maintain oxygen above 88 percent at home independently.       Initial Exercise Prescription: Initial Exercise Prescription - 10/20/17 1200      Date of Initial Exercise RX and Referring Provider   Date  10/20/17    Referring Provider  [name]      Oxygen   Oxygen  Continuous    Liters  3      NuStep   Level  1    SPM  80    Minutes  15    METs  1.2      Biostep-RELP   Level  1    SPM  50    Minutes  15    METs  1      Track   Laps  10    Minutes  15    METs  1.3      Prescription Details   Frequency (times per week)  3    Duration  Progress to 45 minutes of aerobic exercise without signs/symptoms of physical distress      Intensity   THRR 40-80% of Max Heartrate  116-140    Ratings of Perceived Exertion  11-13    Perceived Dyspnea  0-4      Resistance Training   Training Prescription  Yes    Weight  2 lb    Reps  10-15       Perform Capillary Blood Glucose checks as needed.  Exercise Prescription Changes: Exercise Prescription Changes    Row Name 10/26/17 1600 11/11/17 1400 11/24/17 1500 11/25/17 1400 12/08/17 1400     Response to Exercise   Blood Pressure (Admit)  102/60  138/84  124/70  -  134/70   Blood Pressure (Exit)  120/70  124/82  128/72  -  112/68   Heart Rate (Admit)  98 bpm  97 bpm  109 bpm  -  112 bpm   Heart Rate (Exercise)  117 bpm  115 bpm  123 bpm  -  115 bpm   Heart Rate (Exit)  109 bpm  104 bpm  98 bpm  -  98 bpm   Oxygen Saturation (Admit)  90 %  93 %  89 %  -  83 %   Oxygen Saturation (Exercise)  87 %  87 %  87 %  -  82 %   Oxygen Saturation  (Exit)  91 %  89 %  89 %  -  96 %   Rating of Perceived Exertion (Exercise)  _0 -  13   Perceived Dyspnea (Exercise)  _1 -  2   Symptoms  desaturations  desaturations  desaturations  -  desaturations   Comments  second full day of exercise  -  -  -  -   Duration  Progress to 45 minutes of aerobic exercise without signs/symptoms of physical distress  Progress to 45 minutes of aerobic exercise without signs/symptoms of physical distress  Progress to 45 minutes of aerobic exercise without signs/symptoms of physical distress continues to need rest breaks on the track  -  Progress to 45 minutes of aerobic exercise without signs/symptoms of physical distress continues to need rest breaks on the track   Intensity  THRR unchanged  THRR unchanged  THRR unchanged  -  THRR unchanged     Progression   Progression  Continue to progress workloads to maintain intensity without signs/symptoms of physical distress.  Continue to progress workloads to maintain intensity without signs/symptoms of physical distress.  Continue to progress workloads to maintain intensity without signs/symptoms of physical distress.  -  Continue to progress workloads to maintain intensity without signs/symptoms of physical distress.   Average METs  1.9  2.47  2.21  -  2.07     Resistance Training   Training Prescription  Yes  Yes  Yes  -  Yes   Weight  2 lbs  2 lbs  2 lbs  -  2 lbs   Reps  10-15  10-15  10-15  -  10-15     Interval Training   Interval Training  No  No  No  -  No     Oxygen   Oxygen  Continuous  Continuous  Continuous  -  Continuous   Liters  _0 -  6     NuStep   Level  _1 -  3   Minutes  _2 -  15   METs  2.2  3.8  2.8  -  2.7     Biostep-RELP   Level  _3 -  2   Minutes  _4 -  15   METs  _5 -  2     Track   Laps  _6 -  11   Minutes  _7 -  15   METs  1.6  1.6  1.84  -  1.5     Home Exercise Plan   Plans to continue exercise at   -  -  -  Home (comment) walking, gym up the street  Home (comment) walking, gym up the street   Frequency  -  -  -  Add 1 additional day to program exercise sessions.  Add 1 additional day to program exercise sessions.   Initial Home Exercises Provided  -  -  -  11/25/17  11/25/17   Row Name 12/22/17 1400 01/05/18 1300 01/21/18 1600         Response to Exercise   Blood Pressure (Admit)  122/70  130/82  130/62     Blood Pressure (Exit)  122/82  116/60  128/60     Heart Rate (Admit)  111 bpm  121 bpm  116 bpm     Heart Rate (Exercise)  117 bpm  115 bpm  108 bpm     Heart Rate (Exit)  96 bpm  103 bpm  108 bpm     Oxygen Saturation (Admit)  89 %  81 %  84 %     Oxygen Saturation (Exercise)  83 %  85 %  86 %  Oxygen Saturation (Exit)  92 %  96 %  92 %     Rating of Perceived Exertion (Exercise)  _0 Perceived Dyspnea (Exercise)  _1 Symptoms  desaturations  desaturations  desats     Duration  Progress to 45 minutes of aerobic exercise without signs/symptoms of physical distress continues to need rest breaks on the track  Progress to 45 minutes of aerobic exercise without signs/symptoms of physical distress continues to need rest breaks on the track  Progress to 45 minutes of aerobic exercise without signs/symptoms of physical distress rests walking     Intensity  THRR unchanged  THRR unchanged  THRR unchanged       Progression   Progression  Continue to progress workloads to maintain intensity without signs/symptoms of physical distress.  Continue to progress workloads to maintain intensity without signs/symptoms of physical distress.  Continue to progress workloads to maintain intensity without signs/symptoms of physical distress.     Average METs  2  1.87  1.96       Resistance Training   Training Prescription  Yes  Yes  Yes     Weight  2 lbs  3 lbs  3 lb     Reps  10-15  10-15  10-15       Interval Training   Interval Training  No  No  -       Oxygen   Oxygen   Continuous  Continuous  -     Liters  6  6  -       NuStep   Level  3  3  -     Minutes  15  15  -     METs  2.4  2.2  -       Biostep-RELP   Level  2  2  -     Minutes  15  15  -     METs  2  2  -       Track   Laps  12  9  -     Minutes  15  15  -     METs  1.6  1.4  -       Home Exercise Plan   Plans to continue exercise at  Home (comment) walking, gym up the street  Home (comment) walking, gym up the street  -     Frequency  Add 1 additional day to program exercise sessions.  Add 1 additional day to program exercise sessions.  -     Initial Home Exercises Provided  11/25/17  11/25/17  -        Exercise Comments: Exercise Comments    Row Name 11/18/17 1420           Exercise Comments  Stephanie Short went to see Dr. [name] yesterday and was okayed to increase to 4-5L for oxygen when exerting, but she needs 6L to maintain saturations above 88%. We will send another note if this does not post in progress notes from office visit.  We also talked about using her e-cylinders for home exercise versus her portable concentrator as it does not give her enough flow.           Exercise Goals and Review: Exercise Goals    Row Name 10/20/17 1238             Exercise Goals  Increase Physical Activity  Yes       Intervention  Provide advice, education, support and counseling about physical activity/exercise needs.;Develop an individualized exercise prescription for aerobic and resistive training based on initial evaluation findings, risk stratification, comorbidities and participant's personal goals.       Expected Outcomes  Short Term: Attend rehab on a regular basis to increase amount of physical activity.;Long Term: Add in home exercise to make exercise part of routine and to increase amount of physical activity.;Long Term: Exercising regularly at least 3-5 days a week.       Increase Strength and Stamina  Yes       Intervention  Provide advice, education, support and counseling about  physical activity/exercise needs.;Develop an individualized exercise prescription for aerobic and resistive training based on initial evaluation findings, risk stratification, comorbidities and participant's personal goals.       Expected Outcomes  Short Term: Increase workloads from initial exercise prescription for resistance, speed, and METs.;Short Term: Perform resistance training exercises routinely during rehab and add in resistance training at home;Long Term: Improve cardiorespiratory fitness, muscular endurance and strength as measured by increased METs and functional capacity (6MWT)       Able to understand and use rate of perceived exertion (RPE) scale  Yes       Intervention  Provide education and explanation on how to use RPE scale       Expected Outcomes  Short Term: Able to use RPE daily in rehab to express subjective intensity level;Long Term:  Able to use RPE to guide intensity level when exercising independently       Able to understand and use Dyspnea scale  Yes       Intervention  Provide education and explanation on how to use Dyspnea scale       Expected Outcomes  Short Term: Able to use Dyspnea scale daily in rehab to express subjective sense of shortness of breath during exertion;Long Term: Able to use Dyspnea scale to guide intensity level when exercising independently       Knowledge and understanding of Target Heart Rate Range (THRR)  Yes       Intervention  Provide education and explanation of THRR including how the numbers were predicted and where they are located for reference       Expected Outcomes  Short Term: Able to state/look up THRR;Short Term: Able to use daily as guideline for intensity in rehab;Long Term: Able to use THRR to govern intensity when exercising independently       Able to check pulse independently  Yes       Intervention  Provide education and demonstration on how to check pulse in carotid and radial arteries.;Review the importance of being able to  check your own pulse for safety during independent exercise       Expected Outcomes  Short Term: Able to explain why pulse checking is important during independent exercise;Long Term: Able to check pulse independently and accurately       Understanding of Exercise Prescription  Yes       Intervention  Provide education, explanation, and written materials on patient's individual exercise prescription       Expected Outcomes  Short Term: Able to explain program exercise prescription;Long Term: Able to explain home exercise prescription to exercise independently          Exercise Goals Re-Evaluation : Exercise Goals Re-Evaluation    [place] Name 10/23/17 1150 10/26/17 1640 11/11/17 1404 11/24/17 1549 11/25/17 1402  Exercise Goal Re-Evaluation   Exercise Goals Review  Increase Physical Activity;Increase Strength and Stamina;Able to understand and use Dyspnea scale;Able to understand and use rate of perceived exertion (RPE) scale;Understanding of Exercise Prescription;Knowledge and understanding of Target Heart Rate Range (THRR)  Increase Physical Activity;Increase Strength and Stamina;Understanding of Exercise Prescription  Increase Physical Activity;Increase Strength and Stamina;Understanding of Exercise Prescription  Increase Physical Activity;Increase Strength and Stamina;Understanding of Exercise Prescription  Increase Physical Activity;Increase Strength and Stamina;Understanding of Exercise Prescription;Able to understand and use rate of perceived exertion (RPE) scale;Able to understand and use Dyspnea scale;Knowledge and understanding of Target Heart Rate Range (THRR);Able to check pulse independently   Comments  Reviewed RPE scale, THR and program prescription with pt today.  Pt voiced understanding and was given a copy of goals to take home.   Stephanie Short has completed two full days of exercise.  She continues to struggle with maintaining her saturations, especially while walking.  Note was sent to Dr.  [name] to increase her oxygen flow rate during exercise.  We will continue to increase her time of exercise and monitor her progress.   Stephanie Short is doing well in rehab. She is now up to 14 laps while walking.  She still has not gotten permission to increase her oxygen flow for home use. We will send the request over again.   Once cleared for a high oxygen flow, we will review her home exercise guidelines.  We will begin to increase her other pieces of equipment and continue to monitor her progress.   Stephanie Short has continued to do well in rehab.  She now has a high flow cannula for her 6L for exercise.  She is still working on getting a higher flow portable device but she does have e-cylinders she could use. We were going to go over her home exercise guidelines on her next visit but she has missed the last two sessions.  She is up to 18 laps and level 2 on the BioStep.  We will continue to monitor her progress.   Reviewed home exercise with pt today.  Pt plans to walk and go to gym up the street for exercise.  Reviewed THR, pulse, RPE, sign and symptoms, and when to call 911 or MD.  Also discussed weather considerations and indoor options.  Pt voiced understanding.   Expected Outcomes  Short: Use RPE daily to regulate intensity. Long: Follow program prescription in THR.  Short: Continue to attend reguarly.  Long: Continue to follow program prescription.   Short: Attend regularly and get clearance for higher oxygen flow rate.  Long: Continue to increase activity levels.   Short: Review home exercise guidelines.  Long: Continue to increase activity.  Short: Start to add in at least one extra day a week at home.  Long: Continue to move more!!   [place] Name 12/08/17 1441 12/22/17 1438 01/01/18 1148 01/05/18 1305 01/21/18 1651     Exercise Goal Re-Evaluation   Exercise Goals Review  Increase Physical Activity;Increase Strength and Stamina;Understanding of Exercise Prescription  Increase Physical Activity;Increase Strength and  Stamina;Understanding of Exercise Prescription  -  Increase Physical Activity;Increase Strength and Stamina;Understanding of Exercise Prescription  Increase Physical Activity;Increase Strength and Stamina;Able to understand and use rate of perceived exertion (RPE) scale;Able to understand and use Dyspnea scale;Knowledge and understanding of Target Heart Rate Range (THRR);Understanding of Exercise Prescription   Comments  Stephanie Short has been doing well in rehab. She continues to try to walk more and is more diligent about watching to  make sure her saturations do not drop. She is up to level 3 on the NuStep.  We will continue to monitor her progress.   Stephanie Short continues to do well in rehab.  She continues to struggle with saturations dropping during exercise which makes it hard to progress.  Her doctor has limited her to just 6L.  We will continue to monitor her progress.  Stephanie Short has not started her home exercise yet (walking at home). She wants to start after the holidays since it is a busy time of year for her.   Stephanie Short is doing well in rehab.  She is getting in her 9 laps on the track.  Her saturations continue to drop with exercise which inhibits her progression.   We will continue to monitor her progress.   Stephanie Short has progressed to 11 laps walking.  Her sats continue to drop at times.  Staff will monitor progress.   Expected Outcomes  Short: Continue to increase walking and move up on BioStep  Long: Continue to increase activity levels.   Short: Continue to walk more.  Long: Continue to increase strength and stamina.   Short: add one extra day of exercise. Long: become independent with exercise  Short: Continue to monitor saturations to try to increase workloads.  Long: Continue to increase strength and stamina.   Short - continue to attend and work on sat levels Long - maintain 02 at normal range during exercise      Discharge Exercise Prescription (Final Exercise Prescription Changes): Exercise Prescription Changes -  01/21/18 1600      Response to Exercise   Blood Pressure (Admit)  130/62    Blood Pressure (Exit)  128/60    Heart Rate (Admit)  116 bpm    Heart Rate (Exercise)  108 bpm    Heart Rate (Exit)  108 bpm    Oxygen Saturation (Admit)  84 %    Oxygen Saturation (Exercise)  86 %    Oxygen Saturation (Exit)  92 %    Rating of Perceived Exertion (Exercise)  11    Perceived Dyspnea (Exercise)  3    Symptoms  desats    Duration  Progress to 45 minutes of aerobic exercise without signs/symptoms of physical distress   rests walking   Intensity  THRR unchanged      Progression   Progression  Continue to progress workloads to maintain intensity without signs/symptoms of physical distress.    Average METs  1.96      Resistance Training   Training Prescription  Yes    Weight  3 lb    Reps  10-15       Nutrition:  Target Goals: Understanding of nutrition guidelines, daily intake of sodium <1569m, cholesterol <2076m calories 30% from fat and 7% or less from saturated fats, daily to have 5 or more servings of fruits and vegetables.  Biometrics: Pre Biometrics - 10/20/17 1238      Pre Biometrics   Height  5' 4" (1.626 m)    Weight  198 lb 11.2 oz (90.1 kg)    Waist Circumference  40 inches    Hip Circumference  45 inches    Waist to Hip Ratio  0.89 %    BMI (Calculated)  34.09    Single Leg Stand  22.07 seconds        Nutrition Therapy Plan and Nutrition Goals: Nutrition Therapy & Goals - 11/04/17 1517      Nutrition Therapy   Diet  DM  Protein (specify units)  10oz    Fiber  25 grams    Whole Grain Foods  3 servings   eats brown rice   Saturated Fats  12 max. grams    Fruits and Vegetables  5 servings/day   8 ideal; has been eating less vegetables since she was put on oxygen therapy d/t eating out more   Sodium  1500 grams      Personal Nutrition Goals   Nutrition Goal  Make adjustments to your evening snack regimen. You can either work to reduce portions of your  current snacks (try single serve bags for example), or swap your current snack options for more nutrient dense options such as nuts, fruit, or peanut butter + low salt pretzels/ crackers    Personal Goal #2  Start to pay more attention to the amount of sodium you are consuming, especially because you have been eating out more frequently    Personal Goal #3  Work with your husband to start planning meals ahead of time using the ideas in the packet provided    Comments  She and her husband are both diabetics and are working to have a healthier eating plan. They have been eating out more often d/t pt not having the energy to cook at home. She feels she struggles with meal planning knowing how to pair foods together at meal times to make a complete/ nutritionally balanced meal. She does not drink sugar sweetened beverages and typically eats eggs for breakfast. She does not usually eat lunch and instead may snack on some fruit mid-day. She does not eat beef. Feels that night time snacking is one of her worst habits      Intervention Plan   Intervention  Prescribe, educate and counsel regarding individualized specific dietary modifications aiming towards targeted core components such as weight, hypertension, lipid management, diabetes, heart failure and other comorbidities.;Nutrition handout(s) given to patient.   Quick and healthy meal ideas packet; Mediterranean diet informational packet   Expected Outcomes  Short Term Goal: A plan has been developed with personal nutrition goals set during dietitian appointment.;Long Term Goal: Adherence to prescribed nutrition plan.;Short Term Goal: Understand basic principles of dietary content, such as calories, fat, sodium, cholesterol and nutrients.       Nutrition Assessments: Nutrition Assessments - 10/20/17 1138      MEDFICTS Scores   Pre Score  69       Nutrition Goals Re-Evaluation: Nutrition Goals Re-Evaluation    [place] Name 11/04/17 1532 11/25/17 1401  12/28/17 1218         Goals   Nutrition Goal  Make adjustments to your evening snack regimen. You can either work to reduce portions of your current snacks (try single serve bags for example), or swap your current snack options for more nutrient dense options such as nuts, fruit, or peanut butter + low salt pretzels/ crackers  Make adjustments to your evening snack regimen by reducing portions or swapping current choices for more nutritious foods; work with your husband to start planning meals ahead of time; start to pay attention to the sodium content in foods  Make adjustments to your evening snack regimen by reducing portions and/or swapping current choices for more nutritious foods; work with your husband to plan meals ahead of time which will allow you to eat at home more frequently     Comment  She feels that her evening snacking habits are setting her back from working towards her health and weight loss  goals  She has been choosing snack options like nuts, fruit and baked chips (single-serve bags) rather than her traditional snack options. He husband has started to cook more leading to eating less meals eaten outside of the home. He is trying to bake rather than fry and chose leaner meats (chicken, crab, fish, lean pork). She continues not to drink sugar sweetened beverages. Does not usually eat lunch  She has been choosing evening snacks like baked chips, fruit and pistaccios but still reports portions to be an issue at times. Her husband has been "doing good" with cooking for them more often. She reports two episodes this past weekend where her BG dropped to 70 but was able to correct it with a piece of chocolate     Expected Outcome  She will reduce the amount of late night snacks she consumes and/or make more nutritious food choices when choosing to snack after dinner. She will work on portion control in this area  She will continue to make more health-conscious snack choices and keep portions more  controlled. She will continue to work with her husband to prepare more heart-healthy meals at home and choose lean meats most often as protein sources. When/if she is able to come off of oxygen therapy she will start to assist her husband in meal preparation  She will continue to work on being mindful of portions for snacks and she and her husband will continue to eat more meals at home. She will work on eating on a regular time schedule (not skipping her 3rd meal) to prevent episodes of low blood sugar       Personal Goal #2 Re-Evaluation   Personal Goal #2  Start to pay attention to the amount of sodium you are consuming, especially because you have been eating out more frequently  -  -       Personal Goal #3 Re-Evaluation   Personal Goal #3  Work with your husband to start planning meals ahead of time by using the ideas in the packet provided  -  -        Nutrition Goals Discharge (Final Nutrition Goals Re-Evaluation): Nutrition Goals Re-Evaluation - 12/28/17 1218      Goals   Nutrition Goal  Make adjustments to your evening snack regimen by reducing portions and/or swapping current choices for more nutritious foods; work with your husband to plan meals ahead of time which will allow you to eat at home more frequently    Comment  She has been choosing evening snacks like baked chips, fruit and pistaccios but still reports portions to be an issue at times. Her husband has been "doing good" with cooking for them more often. She reports two episodes this past weekend where her BG dropped to 70 but was able to correct it with a piece of chocolate    Expected Outcome  She will continue to work on being mindful of portions for snacks and she and her husband will continue to eat more meals at home. She will work on eating on a regular time schedule (not skipping her 3rd meal) to prevent episodes of low blood sugar       Psychosocial: Target Goals: Acknowledge presence or absence of significant  depression and/or stress, maximize coping skills, provide positive support system. Participant is able to verbalize types and ability to use techniques and skills needed for reducing stress and depression.   Initial Review & Psychosocial Screening: Initial Psych Review & Screening - 10/20/17 1138  Initial Review   Current issues with  Current Stress Concerns;Current Depression    Source of Stress Concerns  Chronic Illness;Unable to perform yard/household activities    Comments  Her COPD and shortness of breath is what stresses her out the most. She cannot walk as far and do the things she use to do.       Family Dynamics   Good Support System?  Yes    Comments  She can look to her husband and gradnson for support.      Barriers   Psychosocial barriers to participate in program  The patient should benefit from training in stress management and relaxation.      Screening Interventions   Interventions  Encouraged to exercise;Program counselor consult;To provide support and resources with identified psychosocial needs;Provide feedback about the scores to participant    Expected Outcomes  Short Term goal: Utilizing psychosocial counselor, staff and physician to assist with identification of specific Stressors or current issues interfering with healing process. Setting desired goal for each stressor or current issue identified.;Long Term Goal: Stressors or current issues are controlled or eliminated.;Short Term goal: Identification and review with participant of any Quality of Life or Depression concerns found by scoring the questionnaire.;Long Term goal: The participant improves quality of Life and PHQ9 Scores as seen by post scores and/or verbalization of changes       Quality of Life Scores:  Scores of 19 and below usually indicate a poorer quality of life in these areas.  A difference of  2-3 points is a clinically meaningful difference.  A difference of 2-3 points in the total score of  the Quality of Life Index has been associated with significant improvement in overall quality of life, self-image, physical symptoms, and general health in studies assessing change in quality of life.  PHQ-9: Recent Review Flowsheet Data    Depression screen [facility] 2/9 01/18/2018 12/21/2017 12/02/2017 10/20/2017 10/13/2017   Decreased Interest _0 0 0   Down, Depressed, Hopeless _1 PHQ - 2 Score _2 Altered sleeping 0 _3 -   Tired, decreased energy _4 -   Change in appetite _5 -   Feeling bad or failure about yourself  2 0 1 3 -   Trouble concentrating 0 0 0 0 -   Moving slowly or fidgety/restless 1 0 1 3 -   Suicidal thoughts 0 0 0 0 -   PHQ-9 Score _6 -   Difficult doing work/chores Somewhat difficult Somewhat difficult Very difficult Extremely dIfficult -     Interpretation of Total Score  Total Score Depression Severity:  1-4 = Minimal depression, 5-9 = Mild depression, 10-14 = Moderate depression, 15-19 = Moderately severe depression, 20-27 = Severe depression   Psychosocial Evaluation and Intervention: Psychosocial Evaluation - 10/26/17 1219      Psychosocial Evaluation & Interventions   Interventions  Relaxation education;Encouraged to exercise with the program and follow exercise prescription    Comments  Counselor met with Ms. Harrel Lemon Stephanie Short) for initial psychosocial evaluation.  She is a 68 year old who struggles with COPD and she has diabetes as well.  Stephanie Short has a strong support system with a spouse of 84 years and a grandson who lives in the home.  She has (4) adult children who live out of state.  Stephanie Short reports sleeping well most of the time and has  a good appetite.  She reports a history of depression diagnosed in 2013 and was treated with counseling.  She also reports some current anxiety symptoms and has a RX to take PRN - but reports taking it rarely.  Stephanie Short reports her mood is impacted by her health currently - and that is her primary stressor at  this time.  Counselor discussed her PHQ-9 Score of "13" which indicates moderated symptoms of depression currently, with a report of feeling bad about herself and feeling down depressed or hopeless at times.  Counselor encouraged Stephanie Short to consider going back to counseling and she does not want to go to the same provider.  She also wants to see if this program will help with her mood.  Counselor suggested a follow up discussion in several weeks to see if her symptoms have improved.  Stephanie Short has goals to lose weight and just to "breathe better."    Expected Outcomes  Short:  Stephanie Short will meet with the dietician to address her weight loss goal.  She will also exercise to decrease her anxiety and improve her mood and ability to breathe better.  Long:  Stephanie Short will exercise consistently to improve her health and mental health    Continue Psychosocial Services   Follow up required by counselor       Psychosocial Re-Evaluation: Psychosocial Re-Evaluation    [place] Name 11/04/17 [place] 11/04/17 1710 12/02/17 1211 12/07/17 1231 12/21/17 1454     Psychosocial Re-Evaluation   Current issues with  Current Stress Concerns;History of Depression;Current Depression  -  Current Stress Concerns;History of Depression;Current Depression  Current Psychotropic Meds;Current Stress Concerns;History of Depression  History of Depression;Current Depression;Current Stress Concerns   Comments  Counselor met with Stephanie Short and her spouse today for follow up.  She reports her mood and quality of life seem to both be getting worse.  Her spouse reports noticing less "spunk" and less energy in his wife.  She states her sleep has improved a little with the new CPAP over the past month and she may be getting approximately 6 hours of sleep now.  She has a history of grief/loss with a son that died tragically in an accident in 2013.  She went to counseling briefly immediately after this occurred but felt she "was not ready." and she also has takens some medication  for anxiety - but not consistently so not very effective.  Counselor recommended Stephanie Short find another therapist to help her through this difficult time.  Also, counselor provided contact info on potential therapists and grief support groups for her.  She is encouraged to speak with her Dr. about a medication evaluation for her depressive symptoms as well in the near future.  Stephanie Short and her husband agreed to these recommendations.  Counselor will follow.    -  Reviewed patient health questionnaire (PHQ-9) with patient for follow up. Previously, patients score indicated signs/symptoms of depression.  Reviewed to see if patient is improving symptom wise while in program.  Score improved and patient states that it is because she has family in town. She has been going out more and it feels good for her to get out of the house.  Counselor follow up with Stephanie Short today reporting she is sleeping so much better with greater ease getting to sleep and improved quality since she began her CPAP recently.  She reports having a little more "spunk" lately with her adult children coming 'home' to visit and she also reports she is learning to accept what  she cannot change in her life better.  Stephanie Short is excited that she may be participating in a clinical trial later this week and also may be reconsidered for a program at [facility] with a new Medication for COPD.  She is very hopeful about this.  Counselor commended Stephanie Short on her progress made and her hard work while in this program.    Reviewed patient health questionnaire (PHQ-9) with patient for follow up. Previously, patients score indicated signs/symptoms of depression.  Reviewed to see if patient is improving symptom wise while in program.  Score improved and patient states that it is because they have been exercising at [place]. She has been doing well and able to cope with her disease better since she started the program.   Expected Outcomes  -  Short:  Stephanie Short will contact a counselor and research  the support group to help her during this difficult time emotionally.  She will contact her Dr. about a medication evaluation once she establishes a baseline with a counselor.  Long:  Stephanie Short will continue to practice positive self-care for her health and mental health.    Short: Continue to attend [facility] regularly for regular exercise and social engagement. Long: Continue to improve symptoms and manage a positive mental state  Short:  Stephanie Short will continue to exercise consistently and continue practicing ways to manage her stress more positively.  Long:  Stephanie Short will exercise and practice positive self-care for her improved quality of life.   Short: Continue to attend [facility] regularly for regular exercise and social engagement. Long: Continue to improve symptoms and manage a positive mental state   Interventions  -  -  Stress management education;Encouraged to attend Pulmonary Rehabilitation for the exercise  -  Encouraged to attend Pulmonary Rehabilitation for the exercise;Stress management education   Continue Psychosocial Services   Follow up required by counselor  -  Follow up required by staff  Follow up required by staff  Follow up required by staff   [place] Name 01/18/18 1226 01/18/18 1500           Psychosocial Re-Evaluation   Current issues with  History of Depression;Current Depression  History of Depression;Current Depression      Comments  Counselor follow up with Stephanie Short today reporting she enjoyed the holidays with family helping more than in the past.  However, this also is a constant reminder that she is unable to do as much and this prompted some tears in [place] today as she discussed with counselor.  She reports her mood is a little better overall, and her core feels stronger now as she is able to do more if she paces herself.  Counselor offered supoort and commended Stephanie Short on her progress and on her awareness of her limitations and need for help at times.   Staff will continue to follow.    Reviewed  patient health questionnaire (PHQ-9) with patient for follow up. Previously, patients score indicated signs/symptoms of depression.  Reviewed to see if patient is improving symptom wise while in program.  Score declined and patient states that it is because she is not able to breath like she wants to and feels like she is not improving.      Expected Outcomes  Short:  Stephanie Short will continue to practice pacing herself and positive coping strategies.  Long:  Stephanie Short will develop positive coping strategies including exercise for her health and mental health.    Short: Continue to work toward an improvement in [place] scores by  attending [facility] regularly. Long: Continue to improve stress and depression coping skills by talking with staff and attending [facility] regularly and work toward a positive mental state.       Interventions  -  Encouraged to attend Pulmonary Rehabilitation for the exercise;Stress management education      Continue Psychosocial Services   Follow up required by staff  Follow up required by staff         Psychosocial Discharge (Final Psychosocial Re-Evaluation): Psychosocial Re-Evaluation - 01/18/18 1500      Psychosocial Re-Evaluation   Current issues with  History of Depression;Current Depression    Comments  Reviewed patient health questionnaire (PHQ-9) with patient for follow up. Previously, patients score indicated signs/symptoms of depression.  Reviewed to see if patient is improving symptom wise while in program.  Score declined and patient states that it is because she is not able to breath like she wants to and feels like she is not improving.    Expected Outcomes  Short: Continue to work toward an improvement in [place] scores by attending [facility] regularly. Long: Continue to improve stress and depression coping skills by talking with staff and attending [facility] regularly and work toward a positive mental state.     Interventions  Encouraged to attend Pulmonary Rehabilitation for the  exercise;Stress management education    Continue Psychosocial Services   Follow up required by staff       Education: Education Goals: Education classes will be provided on a weekly basis, covering required topics. Participant will state understanding/return demonstration of topics presented.  Learning Barriers/Preferences: Learning Barriers/Preferences - 10/20/17 1138      Learning Barriers/Preferences   Learning Barriers  None    Learning Preferences  None       Education Topics:  Initial Evaluation Education: - Verbal, written and demonstration of respiratory meds, oximetry and breathing techniques. Instruction on use of nebulizers and MDIs and importance of monitoring MDI activations.   Pulmonary Rehab from 01/27/2018 in [facility] Cardiac and Pulmonary Rehab  Date  10/20/17  Educator  [facility]  Instruction Review Code  1- Verbalizes Understanding      General Nutrition Guidelines/Fats and Fiber: -Group instruction provided by verbal, written material, models and posters to present the general guidelines for heart healthy nutrition. Gives an explanation and review of dietary fats and fiber.   Pulmonary Rehab from 01/27/2018 in [facility] Cardiac and Pulmonary Rehab  Date  11/25/17  Educator  [facility]  Instruction Review Code  1- Verbalizes Understanding      Controlling Sodium/Reading Food Labels: -Group verbal and written material supporting the discussion of sodium use in heart healthy nutrition. Review and explanation with models, verbal and written materials for utilization of the food label.   Pulmonary Rehab from 01/27/2018 in [facility] Cardiac and Pulmonary Rehab  Date  12/09/17  Educator  [facility]  Instruction Review Code  1- Verbalizes Understanding      Exercise Physiology & General Exercise Guidelines: - Group verbal and written instruction with models to review the exercise physiology of the cardiovascular system and associated critical values. Provides general exercise guidelines with specific  guidelines to those with heart or lung disease.    Pulmonary Rehab from 01/27/2018 in [facility] Cardiac and Pulmonary Rehab  Date  12/25/17  Educator  [facility]  Instruction Review Code  1- Verbalizes Understanding      Aerobic Exercise & Resistance Training: - Gives group verbal and written instruction on the various components of exercise. Focuses on aerobic and resistive training  programs and the benefits of this training and how to safely progress through these programs.   Pulmonary Rehab from 01/27/2018 in [facility] Cardiac and Pulmonary Rehab  Date  01/15/18  Educator  [facility]  Instruction Review Code  1- Verbalizes Understanding      Flexibility, Balance, Mind/Body Relaxation: Provides group verbal/written instruction on the benefits of flexibility and balance training, including mind/body exercise modes such as yoga, pilates and tai chi.  Demonstration and skill practice provided.   Stress and Anxiety: - Provides group verbal and written instruction about the health risks of elevated stress and causes of high stress.  Discuss the correlation between heart/lung disease and anxiety and treatment options. Review healthy ways to manage with stress and anxiety.   Pulmonary Rehab from 01/27/2018 in [facility] Cardiac and Pulmonary Rehab  Date  12/02/17  Educator  [facility]  Instruction Review Code  1- Verbalizes Understanding      Depression: - Provides group verbal and written instruction on the correlation between heart/lung disease and depressed mood, treatment options, and the stigmas associated with seeking treatment.   Pulmonary Rehab from 01/27/2018 in [facility] Cardiac and Pulmonary Rehab  Date  11/04/17  Educator  [facility]  Instruction Review Code  1- Verbalizes Understanding      Exercise & Equipment Safety: - Individual verbal instruction and demonstration of equipment use and safety with use of the equipment.   Pulmonary Rehab from 01/27/2018 in [facility] Cardiac and Pulmonary Rehab  Date  10/20/17  Educator  [facility]   Instruction Review Code  1- Verbalizes Understanding      Infection Prevention: - Provides verbal and written material to individual with discussion of infection control including proper hand washing and proper equipment cleaning during exercise session.   Pulmonary Rehab from 01/27/2018 in [facility] Cardiac and Pulmonary Rehab  Date  10/20/17  Educator  [facility]  Instruction Review Code  1- Verbalizes Understanding      Falls Prevention: - Provides verbal and written material to individual with discussion of falls prevention and safety.   Pulmonary Rehab from 01/27/2018 in [facility] Cardiac and Pulmonary Rehab  Date  10/20/17  Educator  [facility]  Instruction Review Code  1- Verbalizes Understanding      Diabetes: - Individual verbal and written instruction to review signs/symptoms of diabetes, desired ranges of glucose level fasting, after meals and with exercise. Advice that pre and post exercise glucose checks will be done for 3 sessions at entry of program.   Chronic Lung Diseases: - Group verbal and written instruction to review updates, respiratory medications, advancements in procedures and treatments. Discuss use of supplemental oxygen including available portable oxygen systems, continuous and intermittent flow rates, concentrators, personal use and safety guidelines. Review proper use of inhaler and spacers. Provide informative websites for self-education.    Pulmonary Rehab from 01/27/2018 in [facility] Cardiac and Pulmonary Rehab  Date  12/23/17  Educator  [facility]  Instruction Review Code  1- Verbalizes Understanding      Energy Conservation: - Provide group verbal and written instruction for methods to conserve energy, plan and organize activities. Instruct on pacing techniques, use of adaptive equipment and posture/positioning to relieve shortness of breath.   Pulmonary Rehab from 01/27/2018 in [facility] Cardiac and Pulmonary Rehab  Date  11/18/17  Educator  [facility]  Instruction Review Code  1- Verbalizes  Understanding      Triggers and Exacerbations: - Group verbal and written instruction to review types of environmental triggers and ways to prevent exacerbations. Discuss weather changes,  air quality and the benefits of nasal washing. Review warning signs and symptoms to help prevent infections. Discuss techniques for effective airway clearance, coughing, and vibrations.   Pulmonary Rehab from 01/27/2018 in [facility] Cardiac and Pulmonary Rehab  Date  01/08/18  Educator  [facility]  Instruction Review Code  1- Verbalizes Understanding      AED/CPR: - Group verbal and written instruction with the use of models to demonstrate the basic use of the AED with the basic ABC's of resuscitation.   Pulmonary Rehab from 01/27/2018 in [facility] Cardiac and Pulmonary Rehab  Date  11/27/17  Educator  [facility]  Instruction Review Code  1- Actuary and Physiology of the Lungs: - Group verbal and written instruction with the use of models to provide basic lung anatomy and physiology related to function, structure and complications of lung disease.   Anatomy & Physiology of the Heart: - Group verbal and written instruction and models provide basic cardiac anatomy and physiology, with the coronary electrical and arterial systems. Review of Valvular disease and Heart Failure   Pulmonary Rehab from 01/27/2018 in [facility] Cardiac and Pulmonary Rehab  Date  10/30/17  Educator  [facility]  Instruction Review Code  1- Verbalizes Understanding      Cardiac Medications: - Group verbal and written instruction to review commonly prescribed medications for heart disease. Reviews the medication, class of the drug, and side effects.   Pulmonary Rehab from 01/27/2018 in [facility] Cardiac and Pulmonary Rehab  Date  11/13/17  Educator  [facility]  Instruction Review Code  1- Verbalizes Understanding      Know Your Numbers and Risk Factors: -Group verbal and written instruction about important numbers in your health.  Discussion of  what are risk factors and how they play a role in the disease process.  Review of Cholesterol, Blood Pressure, Diabetes, and BMI and the role they play in your overall health.   Pulmonary Rehab from 01/27/2018 in [facility] Cardiac and Pulmonary Rehab  Date  01/06/18  Educator  [facility]  Instruction Review Code  1- Verbalizes Understanding      Sleep Hygiene: -Provides group verbal and written instruction about how sleep can affect your health.  Define sleep hygiene, discuss sleep cycles and impact of sleep habits. Review good sleep hygiene tips.    Pulmonary Rehab from 01/27/2018 in [facility] Cardiac and Pulmonary Rehab  Date  01/27/18  Educator  [facility]  Instruction Review Code  1- Verbalizes Understanding      Other: -Provides group and verbal instruction on various topics (see comments)    Knowledge Questionnaire Score: Knowledge Questionnaire Score - 10/20/17 1138      Knowledge Questionnaire Score   Pre Score  14/18   reviewed with patient        Core Components/Risk Factors/Patient Goals at Admission: Personal Goals and Risk Factors at Admission - 10/20/17 1144      Core Components/Risk Factors/Patient Goals on Admission    Weight Management  Yes;Weight Loss    Intervention  Weight Management: Develop a combined nutrition and exercise program designed to reach desired caloric intake, while maintaining appropriate intake of nutrient and fiber, sodium and fats, and appropriate energy expenditure required for the weight goal.;Weight Management: Provide education and appropriate resources to help participant work on and attain dietary goals.;Weight Management/Obesity: Establish reasonable short term and long term weight goals.    Admit Weight  198 lb 11.2 oz (90.1 kg)    Goal Weight: Short Term  193 lb (87.5 kg)    Goal Weight: Long Term  150 lb (68 kg)    Expected Outcomes  Short Term: Continue to assess and modify interventions until short term weight is achieved;Long Term: Adherence to nutrition  and physical activity/exercise program aimed toward attainment of established weight goal;Weight Loss: Understanding of general recommendations for a balanced deficit meal plan, which promotes 1-2 lb weight loss per week and includes a negative energy balance of [phone] kcal/d;Understanding recommendations for meals to include 15-35% energy as protein, 25-35% energy from fat, 35-60% energy from carbohydrates, less than 267m of dietary cholesterol, 20-35 gm of total fiber daily;Understanding of distribution of calorie intake throughout the day with the consumption of 4-5 meals/snacks    Improve shortness of breath with ADL's  Yes    Intervention  Provide education, individualized exercise plan and daily activity instruction to help decrease symptoms of SOB with activities of daily living.    Expected Outcomes  Short Term: Improve cardiorespiratory fitness to achieve a reduction of symptoms when performing ADLs;Long Term: Be able to perform more ADLs without symptoms or delay the onset of symptoms    Diabetes  Yes    Intervention  Provide education about proper nutrition, including hydration, and aerobic/resistive exercise prescription along with prescribed medications to achieve blood glucose in normal ranges: Fasting glucose 65-99 mg/dL;Provide education about signs/symptoms and action to take for hypo/hyperglycemia.    Expected Outcomes  Long Term: Attainment of HbA1C < 7%.;Short Term: Participant verbalizes understanding of the signs/symptoms and immediate care of hyper/hypoglycemia, proper foot care and importance of medication, aerobic/resistive exercise and nutrition plan for blood glucose control.    Hypertension  Yes    Intervention  Provide education on lifestyle modifcations including regular physical activity/exercise, weight management, moderate sodium restriction and increased consumption of fresh fruit, vegetables, and low fat dairy, alcohol moderation, and smoking cessation.;Monitor  prescription use compliance.    Expected Outcomes  Short Term: Continued assessment and intervention until BP is < 140/922mHG in hypertensive participants. < 130/8064mG in hypertensive participants with diabetes, heart failure or chronic kidney disease.;Long Term: Maintenance of blood pressure at goal levels.       Core Components/Risk Factors/Patient Goals Review:  Goals and Risk Factor Review    Row Name 12/02/17 1221 01/01/18 1148 01/27/18 1446         Core Components/Risk Factors/Patient Goals Review   Personal Goals Review  Weight Management/Obesity;Improve shortness of breath with ADL's;Hypertension;Diabetes  Weight Management/Obesity;Improve shortness of breath with ADL's;Hypertension;Diabetes  Weight Management/Obesity;Improve shortness of breath with ADL's;Hypertension;Diabetes     Review  Patient has been checking her blood sugar at home and has not been over 160. She has been checking her blood pressure at home and states it has been good. Her ADL's have not been getting any easier for her. She may be eligable for a lung program to have other treatment options.  She is going in for a heart cath next Wednesday [place] when she is back.  Her cath was clean, showed mild to moderate pulmonary hypertension. Liz's weight has not changed. She is still monitoring her blood sugar which has been running 140s-160s. She is still experiencing tiredness, but can tell an improvement. She realized she does not increase her oxygen when she is up moving around   [place]iz[place]athlee Nationss been improving slightly with walking and has now moved to the treadmill. She wants to get better so  she can do more at home and hopes to get a lung transplant.      Expected Outcomes  -  Short: increase oxygen when she is active to help with shortness of breath. Long: continue monitoring blood sugar and blood pressure.   Short: Attend [facility] regularly to improve shortness of breath  with ADL's. Long: maintain independence with ADL's         Core Components/Risk Factors/Patient Goals at Discharge (Final Review):  Goals and Risk Factor Review - 01/27/18 1446      Core Components/Risk Factors/Patient Goals Review   Personal Goals Review  Weight Management/Obesity;Improve shortness of breath with ADL's;Hypertension;Diabetes    Review  Stephanie Short has been improving slightly with walking and has now moved to the treadmill. She wants to get better so she can do more at home and hopes to get a lung transplant.     Expected Outcomes  Short: Attend [facility] regularly to improve shortness of breath with ADL's. Long: maintain independence with ADL's        ITP Comments: ITP Comments    Row Name 10/20/17 1108 10/23/17 1151 11/09/17 0901 11/18/17 1420 12/07/17 0845   ITP Comments  Medical Evaluation completed. Chart sent for review and changes to Dr. Emily Filbert Director of [place]. Diagnosis can be found in [facility] encounter  First full day of exercise!  Patient was oriented to gym and equipment including functions, settings, policies, and procedures.  Patient's individual exercise prescription and treatment plan were reviewed.  All starting workloads were established based on the results of the 6 minute walk test done at initial orientation visit.  The plan for exercise progression was also introduced and progression will be customized based on patient's performance and goals  30 day review completed. ITP sent to Dr. Emily Filbert Director of [place]. Continue with ITP unless changes are made by physician.  Stephanie Short went to see Dr. [name] yesterday and was okayed to increase to 4-5L for oxygen when exerting, but she needs 6L to maintain saturations above 88%. We will send another note if this does not post in progress notes from office visit.  We also talked about using her e-cylinders for home exercise versus her portable concentrator as it does not give her enough flow.   30 day review completed.  ITP sent to Dr. Emily Filbert Director of [place]. Continue with ITP unless changes are made by physician.   [place] Name 01/04/18 0852 02/01/18 0850         ITP Comments  30 day review completed. ITP sent to Dr. Emily Filbert Director of [place]. Continue with ITP unless changes are made by physician.  30 day review completed. ITP sent to Dr. Emily Filbert Director of [place]. Continue with ITP unless changes are made by physician.         Comments: 30 day review

## 2018-02-03 ENCOUNTER — Encounter: Payer: 59 | Admitting: *Deleted

## 2018-02-03 VITALS — Ht 64.0 in | Wt 199.0 lb

## 2018-02-03 DIAGNOSIS — J449 Chronic obstructive pulmonary disease, unspecified: Secondary | ICD-10-CM

## 2018-02-03 NOTE — Progress Notes (Signed)
Daily Session Note  Patient Details  Name: Stephanie Short MRN: [id number] Date of Birth: Apr 27, 1950 Referring Provider:     Pulmonary Rehab from 10/20/2017 in [facility] Cardiac and Pulmonary Rehab  Referring Provider  [name]      Encounter Date: 02/03/2018  Check In: Session Check In - 02/03/18 1116      Check-In   Supervising physician immediately available to respond to emergencies  [facility] immediately available ER MD    Physician(s)  Drs. Joni Fears and [place]    Location  [facility]-Cardiac & Pulmonary Rehab    Staff Present  Alberteen Sam, MA, RCEP, CCRP, Exercise Physiologist;Joseph Alcus Dad, RN BSN    Medication changes reported      No    Fall or balance concerns reported     No    Warm-up and Cool-down  Performed as group-led Higher education careers adviser Performed  Yes    VAD Patient?  No    PAD/SET Patient?  No      Pain Assessment   Currently in Pain?  No/denies          Social History   Tobacco Use  Smoking Status Former Smoker  . Packs/day: 1.00  . Years: 20.00  . Pack years: 20.00  . Types: Cigarettes  . Last attempt to quit: 01/21/2004  . Years since quitting: 14.0  Smokeless Tobacco Never Used    Goals Met:  Proper associated with RPD/PD & O2 Sat Independence with exercise equipment Using PLB without cueing & demonstrates good technique Exercise tolerated well No report of cardiac concerns or symptoms Strength training completed today  Goals Unmet:  Not Applicable  Comments: Pt able to follow exercise prescription today without complaint.  Will continue to monitor for progression.  [place] Name 10/20/17 1239 02/03/18 1159       6 Minute Walk   Phase  Initial  Discharge    Distance  300 feet  220 feet    Distance % Change  -  -26.7 %    Distance Feet Change  -  -80 ft    Walk Time  2.75 minutes  1.93 minutes    # of Rest Breaks  0  0    MPH  1.23  1.29    METS  1.2  1.17    RPE  11  17    Perceived Dyspnea   3  3    VO2 Peak  4.2  4.11    Symptoms  No  Yes (comment)    Comments  -  SOB    Resting HR  92 bpm  94 bpm    Resting BP  114/64  148/74    Resting Oxygen Saturation   91 %  90 %    Exercise Oxygen Saturation  during 6 min walk  78 %  78 %    Max Ex. HR  120 bpm  122 bpm    Max Ex. BP  142/64  158/78    2 Minute Post BP  114/60  142/76      Interval HR   1 Minute HR  110  105    2 Minute HR  117  122    3 Minute HR  120  -    2 Minute Post HR  -  100    Interval Heart Rate?  Yes  Yes      Interval Oxygen   Interval Oxygen?  Yes  Yes    Baseline Oxygen Saturation %  91 %  90 %    1 Minute Oxygen Saturation %  84 %  84 %    1 Minute Liters of Oxygen  3 L pulsed  6 L    2 Minute Oxygen Saturation %  80 %  78 %    2 Minute Liters of Oxygen  3 L pulsed  6 L    3 Minute Oxygen Saturation %  78 %  -    3 Minute Liters of Oxygen  3 L pulsed  -    2 Minute Post Oxygen Saturation %  -  92 %    2 Minute Post Liters of Oxygen  -  6 L        Dr. Emily Filbert is Medical Director for [place] and [facility]monary [facility].

## 2018-02-05 ENCOUNTER — Encounter: Payer: 59 | Admitting: *Deleted

## 2018-02-05 DIAGNOSIS — J449 Chronic obstructive pulmonary disease, unspecified: Secondary | ICD-10-CM | POA: Diagnosis not present

## 2018-02-05 NOTE — Progress Notes (Signed)
Daily Session Note  Patient Details  Name: Stephanie Short MRN: [id number] Date of Birth: 02/24/1950 Referring Provider:     Pulmonary Rehab from 10/20/2017 in [facility] Cardiac and Pulmonary Rehab  Referring Provider  [name]      Encounter Date: 02/05/2018  Check In: Session Check In - 02/05/18 1141      Check-In   Supervising physician immediately available to respond to emergencies  [facility] immediately available ER MD    Physician(s)  Dr. Corky Downs and Clearnce Hasten    Location  [facility]-Cardiac & Pulmonary Rehab    Staff Present  Justin Mend Lorre Nick, [place], RCEP, CCRP, Exercise Physiologist;Meredith Sherryll Burger, RN BSN    Medication changes reported      No    Fall or balance concerns reported     No    Tobacco Cessation  No Change    Warm-up and Cool-down  Performed as group-led instruction    Resistance Training Performed  Yes    VAD Patient?  No    PAD/SET Patient?  No      Pain Assessment   Currently in Pain?  No/denies          Social History   Tobacco Use  Smoking Status Former Smoker  . Packs/day: 1.00  . Years: 20.00  . Pack years: 20.00  . Types: Cigarettes  . Last attempt to quit: 01/21/2004  . Years since quitting: 14.0  Smokeless Tobacco Never Used    Goals Met:  Proper associated with RPD/PD & O2 Sat Independence with exercise equipment Exercise tolerated well No report of cardiac concerns or symptoms Strength training completed today  Goals Unmet:  Not Applicable  Comments: Pt able to follow exercise prescription today without complaint.  Will continue to monitor for progression.    Dr. Emily Filbert is Medical Director for [place] and [facility].

## 2018-02-08 DIAGNOSIS — J449 Chronic obstructive pulmonary disease, unspecified: Secondary | ICD-10-CM | POA: Diagnosis not present

## 2018-02-08 NOTE — Progress Notes (Signed)
Daily Session Note  Patient Details  Name: Stephanie Short MRN: [id number] Date of Birth: Jan 16, 1951 Referring Provider:     Pulmonary Rehab from 10/20/2017 in [facility] Cardiac and Pulmonary Rehab  Referring Provider  [name]      Encounter Date: 02/08/2018  Check In:      Social History   Tobacco Use  Smoking Status Former Smoker  . Packs/day: 1.00  . Years: 20.00  . Pack years: 20.00  . Types: Cigarettes  . Last attempt to quit: 01/21/2004  . Years since quitting: 14.0  Smokeless Tobacco Never Used    Goals Met:  Proper associated with RPD/PD & O2 Sat Independence with exercise equipment Exercise tolerated well Strength training completed today  Goals Unmet:  Not Applicable  Comments: Pt able to follow exercise prescription today without complaint.  Will continue to monitor for progression.    Dr. Emily Filbert is Medical Director for [place] and [facility].

## 2018-02-10 DIAGNOSIS — J449 Chronic obstructive pulmonary disease, unspecified: Secondary | ICD-10-CM

## 2018-02-10 NOTE — Progress Notes (Signed)
Daily Session Note  Patient Details  Name: Stephanie Short MRN: [id number] Date of Birth: Jun 23, 1950 Referring Provider:     Pulmonary Rehab from 10/20/2017 in [facility] Cardiac and Pulmonary Rehab  Referring Provider  [name]      Encounter Date: 02/10/2018  Check In: Session Check In - 02/10/18 1149      Check-In   Supervising physician immediately available to respond to emergencies  [facility] immediately available ER MD    Physician(s)  Dr. Mariea Clonts and Archie Balboa    Location  [facility]-Cardiac & Pulmonary Rehab    Staff Present  Renita Papa, RN BSN;Jessica Luan Pulling, MA, RCEP, CCRP, Exercise Physiologist;Ilir Mahrt Tessie Fass RCP,RRT,BSRT    Medication changes reported      No    Fall or balance concerns reported     No    Warm-up and Cool-down  Performed as group-led instruction    Resistance Training Performed  Yes    VAD Patient?  No    PAD/SET Patient?  No      Pain Assessment   Currently in Pain?  No/denies          Social History   Tobacco Use  Smoking Status Former Smoker  . Packs/day: 1.00  . Years: 20.00  . Pack years: 20.00  . Types: Cigarettes  . Last attempt to quit: 01/21/2004  . Years since quitting: 14.0  Smokeless Tobacco Never Used    Goals Met:  Independence with exercise equipment Exercise tolerated well No report of cardiac concerns or symptoms Strength training completed today  Goals Unmet:  Not Applicable  Comments: Pt able to follow exercise prescription today without complaint.  Will continue to monitor for progression.    Dr. Emily Filbert is Medical Director for [place] and [facility].

## 2018-02-12 DIAGNOSIS — J449 Chronic obstructive pulmonary disease, unspecified: Secondary | ICD-10-CM

## 2018-02-12 NOTE — Progress Notes (Signed)
Daily Session Note  Patient Details  Name: Stephanie Short MRN: [id number] Date of Birth: 12-15-1950 Referring Provider:     Pulmonary Rehab from 10/20/2017 in [facility] Cardiac and Pulmonary Rehab  Referring Provider  [name]      Encounter Date: 02/12/2018  Check In: Session Check In - 02/12/18 1131      Check-In   Supervising physician immediately available to respond to emergencies  [facility] immediately available ER MD    Physician(s)  Dr. Mariea Clonts and Corky Downs    Location  [facility]-Cardiac & Pulmonary Rehab    Staff Present  Justin Mend Lorre Nick, [place], RCEP, CCRP, Exercise Physiologist;Meredith Sherryll Burger, RN BSN    Medication changes reported      No    Fall or balance concerns reported     No    Warm-up and Cool-down  Performed as group-led Higher education careers adviser Performed  Yes    VAD Patient?  No    PAD/SET Patient?  No      Pain Assessment   Currently in Pain?  No/denies          Social History   Tobacco Use  Smoking Status Former Smoker  . Packs/day: 1.00  . Years: 20.00  . Pack years: 20.00  . Types: Cigarettes  . Last attempt to quit: 01/21/2004  . Years since quitting: 14.0  Smokeless Tobacco Never Used    Goals Met:  Independence with exercise equipment Exercise tolerated well No report of cardiac concerns or symptoms Strength training completed today  Goals Unmet:  Not Applicable  Comments: Pt able to follow exercise prescription today without complaint.  Will continue to monitor for progression.    Dr. Emily Filbert is Medical Director for [place] and [facility].

## 2018-02-16 ENCOUNTER — Other Ambulatory Visit: Payer: Self-pay | Admitting: Nurse Practitioner

## 2018-02-16 NOTE — Patient Instructions (Signed)
Discharge Patient Instructions  Patient Details  Name: Stephanie Short MRN: [id number] Date of Birth: August 01, 1950 Referring Provider:  Erby Pian, MD   Number of Visits: 16  Reason for Discharge:  Patient reached a stable level of exercise.  Smoking History:  Social History   Tobacco Use  Smoking Status Former Smoker  . Packs/day: 1.00  . Years: 20.00  . Pack years: 20.00  . Types: Cigarettes  . Last attempt to quit: 01/21/2004  . Years since quitting: 14.0  Smokeless Tobacco Never Used    Diagnosis:  Chronic obstructive pulmonary disease, unspecified COPD type ([place])  Initial Exercise Prescription: Initial Exercise Prescription - 10/20/17 1200      Date of Initial Exercise RX and Referring Provider   Date  10/20/17    Referring Provider  [name]      Oxygen   Oxygen  Continuous    Liters  3      NuStep   Level  1    SPM  80    Minutes  15    METs  1.2      Biostep-RELP   Level  1    SPM  50    Minutes  15    METs  1      Track   Laps  10    Minutes  15    METs  1.3      Prescription Details   Frequency (times per week)  3    Duration  Progress to 45 minutes of aerobic exercise without signs/symptoms of physical distress      Intensity   THRR 40-80% of Max Heartrate  116-140    Ratings of Perceived Exertion  11-13    Perceived Dyspnea  0-4      Resistance Training   Training Prescription  Yes    Weight  2 lb    Reps  10-15       Discharge Exercise Prescription (Final Exercise Prescription Changes): Exercise Prescription Changes - 02/01/18 1600      Response to Exercise   Blood Pressure (Admit)  134/68    Blood Pressure (Exit)  126/62    Heart Rate (Admit)  99 bpm    Heart Rate (Exercise)  118 bpm    Heart Rate (Exit)  99 bpm    Oxygen Saturation (Admit)  90 %    Oxygen Saturation (Exercise)  85 %    Oxygen Saturation (Exit)  92 %    Rating of Perceived Exertion (Exercise)  13    Perceived Dyspnea (Exercise)  2    Symptoms   desats    Duration  Progress to 45 minutes of aerobic exercise without signs/symptoms of physical distress   rests walking   Intensity  THRR unchanged      Progression   Progression  Continue to progress workloads to maintain intensity without signs/symptoms of physical distress.    Average METs  1.53      Resistance Training   Training Prescription  Yes    Weight  3 lbs    Reps  10-15      Interval Training   Interval Training  No      Oxygen   Oxygen  Continuous    Liters  6      Treadmill   MPH  0.5    Grade  0    Minutes  15    METs  1.38      NuStep   Level  3  Minutes  15    METs  2.2      Biostep-RELP   Level  2    Minutes  15    METs  1      Home Exercise Plan   Plans to continue exercise at  Home (comment)   walking, gym up the street   Frequency  Add 1 additional day to program exercise sessions.    Initial Home Exercises Provided  11/25/17       Functional Capacity: 6 Minute Walk    Row Name 10/20/17 1239 02/03/18 1159       6 Minute Walk   Phase  Initial  Discharge    Distance  300 feet  220 feet    Distance % Change  -  -26.7 %    Distance Feet Change  -  -80 ft    Walk Time  2.75 minutes  1.93 minutes    # of Rest Breaks  0  0    MPH  1.23  1.29    METS  1.2  1.17    RPE  11  17    Perceived Dyspnea   3  3    VO2 Peak  4.2  4.11    Symptoms  No  Yes (comment)    Comments  -  SOB    Resting HR  92 bpm  94 bpm    Resting BP  114/64  148/74    Resting Oxygen Saturation   91 %  90 %    Exercise Oxygen Saturation  during 6 min walk  78 %  78 %    Max Ex. HR  120 bpm  122 bpm    Max Ex. BP  142/64  158/78    2 Minute Post BP  114/60  142/76      Interval HR   1 Minute HR  110  105    2 Minute HR  117  122    3 Minute HR  120  -    2 Minute Post HR  -  100    Interval Heart Rate?  Yes  Yes      Interval Oxygen   Interval Oxygen?  Yes  Yes    Baseline Oxygen Saturation %  91 %  90 %    1 Minute Oxygen Saturation %  84 %  84 %     1 Minute Liters of Oxygen  3 L pulsed  6 L    2 Minute Oxygen Saturation %  80 %  78 %    2 Minute Liters of Oxygen  3 L pulsed  6 L    3 Minute Oxygen Saturation %  78 %  -    3 Minute Liters of Oxygen  3 L pulsed  -    2 Minute Post Oxygen Saturation %  -  92 %    2 Minute Post Liters of Oxygen  -  6 L       Quality of Life:   Personal Goals: Goals established at orientation with interventions provided to work toward goal. Personal Goals and Risk Factors at Admission - 10/20/17 1144      Core Components/Risk Factors/Patient Goals on Admission    Weight Management  Yes;Weight Loss    Intervention  Weight Management: Develop a combined nutrition and exercise program designed to reach desired caloric intake, while maintaining appropriate intake of nutrient and fiber, sodium and fats, and appropriate energy expenditure required for  the weight goal.;Weight Management: Provide education and appropriate resources to help participant work on and attain dietary goals.;Weight Management/Obesity: Establish reasonable short term and long term weight goals.    Admit Weight  198 lb 11.2 oz (90.1 kg)    Goal Weight: Short Term  193 lb (87.5 kg)    Goal Weight: Long Term  150 lb (68 kg)    Expected Outcomes  Short Term: Continue to assess and modify interventions until short term weight is achieved;Long Term: Adherence to nutrition and physical activity/exercise program aimed toward attainment of established weight goal;Weight Loss: Understanding of general recommendations for a balanced deficit meal plan, which promotes 1-2 lb weight loss per week and includes a negative energy balance of [phone] kcal/d;Understanding recommendations for meals to include 15-35% energy as protein, 25-35% energy from fat, 35-60% energy from carbohydrates, less than 229m of dietary cholesterol, 20-35 gm of total fiber daily;Understanding of distribution of calorie intake throughout the day with the consumption of 4-5  meals/snacks    Improve shortness of breath with ADL's  Yes    Intervention  Provide education, individualized exercise plan and daily activity instruction to help decrease symptoms of SOB with activities of daily living.    Expected Outcomes  Short Term: Improve cardiorespiratory fitness to achieve a reduction of symptoms when performing ADLs;Long Term: Be able to perform more ADLs without symptoms or delay the onset of symptoms    Diabetes  Yes    Intervention  Provide education about proper nutrition, including hydration, and aerobic/resistive exercise prescription along with prescribed medications to achieve blood glucose in normal ranges: Fasting glucose 65-99 mg/dL;Provide education about signs/symptoms and action to take for hypo/hyperglycemia.    Expected Outcomes  Long Term: Attainment of HbA1C < 7%.;Short Term: Participant verbalizes understanding of the signs/symptoms and immediate care of hyper/hypoglycemia, proper foot care and importance of medication, aerobic/resistive exercise and nutrition plan for blood glucose control.    Hypertension  Yes    Intervention  Provide education on lifestyle modifcations including regular physical activity/exercise, weight management, moderate sodium restriction and increased consumption of fresh fruit, vegetables, and low fat dairy, alcohol moderation, and smoking cessation.;Monitor prescription use compliance.    Expected Outcomes  Short Term: Continued assessment and intervention until BP is < 140/915mHG in hypertensive participants. < 130/8021mG in hypertensive participants with diabetes, heart failure or chronic kidney disease.;Long Term: Maintenance of blood pressure at goal levels.        Personal Goals Discharge: Goals and Risk Factor Review - 01/27/18 1446      Core Components/Risk Factors/Patient Goals Review   Personal Goals Review  Weight Management/Obesity;Improve shortness of breath with ADL's;Hypertension;Diabetes    Review  LizKathlee Nationss  been improving slightly with walking and has now moved to the treadmill. She wants to get better so she can do more at home and hopes to get a lung transplant.     Expected Outcomes  Short: Attend [facility] regularly to improve shortness of breath with ADL's. Long: maintain independence with ADL's        Exercise Goals and Review: Exercise Goals    Row Name 10/20/17 1238             Exercise Goals   Increase Physical Activity  Yes       Intervention  Provide advice, education, support and counseling about physical activity/exercise needs.;Develop an individualized exercise prescription for aerobic and resistive training based on initial evaluation findings, risk stratification, comorbidities and participant's personal goals.  Expected Outcomes  Short Term: Attend rehab on a regular basis to increase amount of physical activity.;Long Term: Add in home exercise to make exercise part of routine and to increase amount of physical activity.;Long Term: Exercising regularly at least 3-5 days a week.       Increase Strength and Stamina  Yes       Intervention  Provide advice, education, support and counseling about physical activity/exercise needs.;Develop an individualized exercise prescription for aerobic and resistive training based on initial evaluation findings, risk stratification, comorbidities and participant's personal goals.       Expected Outcomes  Short Term: Increase workloads from initial exercise prescription for resistance, speed, and METs.;Short Term: Perform resistance training exercises routinely during rehab and add in resistance training at home;Long Term: Improve cardiorespiratory fitness, muscular endurance and strength as measured by increased METs and functional capacity (6MWT)       Able to understand and use rate of perceived exertion (RPE) scale  Yes       Intervention  Provide education and explanation on how to use RPE scale       Expected Outcomes  Short Term: Able  to use RPE daily in rehab to express subjective intensity level;Long Term:  Able to use RPE to guide intensity level when exercising independently       Able to understand and use Dyspnea scale  Yes       Intervention  Provide education and explanation on how to use Dyspnea scale       Expected Outcomes  Short Term: Able to use Dyspnea scale daily in rehab to express subjective sense of shortness of breath during exertion;Long Term: Able to use Dyspnea scale to guide intensity level when exercising independently       Knowledge and understanding of Target Heart Rate Range (THRR)  Yes       Intervention  Provide education and explanation of THRR including how the numbers were predicted and where they are located for reference       Expected Outcomes  Short Term: Able to state/look up THRR;Short Term: Able to use daily as guideline for intensity in rehab;Long Term: Able to use THRR to govern intensity when exercising independently       Able to check pulse independently  Yes       Intervention  Provide education and demonstration on how to check pulse in carotid and radial arteries.;Review the importance of being able to check your own pulse for safety during independent exercise       Expected Outcomes  Short Term: Able to explain why pulse checking is important during independent exercise;Long Term: Able to check pulse independently and accurately       Understanding of Exercise Prescription  Yes       Intervention  Provide education, explanation, and written materials on patient's individual exercise prescription       Expected Outcomes  Short Term: Able to explain program exercise prescription;Long Term: Able to explain home exercise prescription to exercise independently          Exercise Goals Re-Evaluation: Exercise Goals Re-Evaluation    Row Name 10/23/17 1150 10/26/17 1640 11/11/17 1404 11/24/17 1549 11/25/17 1402     Exercise Goal Re-Evaluation   Exercise Goals Review  Increase Physical  Activity;Increase Strength and Stamina;Able to understand and use Dyspnea scale;Able to understand and use rate of perceived exertion (RPE) scale;Understanding of Exercise Prescription;Knowledge and understanding of Target Heart Rate Range (THRR)  Increase Physical Activity;Increase Strength  and Stamina;Understanding of Exercise Prescription  Increase Physical Activity;Increase Strength and Stamina;Understanding of Exercise Prescription  Increase Physical Activity;Increase Strength and Stamina;Understanding of Exercise Prescription  Increase Physical Activity;Increase Strength and Stamina;Understanding of Exercise Prescription;Able to understand and use rate of perceived exertion (RPE) scale;Able to understand and use Dyspnea scale;Knowledge and understanding of Target Heart Rate Range (THRR);Able to check pulse independently   Comments  Reviewed RPE scale, THR and program prescription with pt today.  Pt voiced understanding and was given a copy of goals to take home.   Kathlee Nations has completed two full days of exercise.  She continues to struggle with maintaining her saturations, especially while walking.  Note was sent to Dr. [name] to increase her oxygen flow rate during exercise.  We will continue to increase her time of exercise and monitor her progress.   Kathlee Nations is doing well in rehab. She is now up to 14 laps while walking.  She still has not gotten permission to increase her oxygen flow for home use. We will send the request over again.   Once cleared for a high oxygen flow, we will review her home exercise guidelines.  We will begin to increase her other pieces of equipment and continue to monitor her progress.   Kathlee Nations has continued to do well in rehab.  She now has a high flow cannula for her 6L for exercise.  She is still working on getting a higher flow portable device but she does have e-cylinders she could use. We were going to go over her home exercise guidelines on her next visit but she has missed the last  two sessions.  She is up to 18 laps and level 2 on the BioStep.  We will continue to monitor her progress.   Reviewed home exercise with pt today.  Pt plans to walk and go to gym up the street for exercise.  Reviewed THR, pulse, RPE, sign and symptoms, and when to call 911 or MD.  Also discussed weather considerations and indoor options.  Pt voiced understanding.   Expected Outcomes  Short: Use RPE daily to regulate intensity. Long: Follow program prescription in THR.  Short: Continue to attend reguarly.  Long: Continue to follow program prescription.   Short: Attend regularly and get clearance for higher oxygen flow rate.  Long: Continue to increase activity levels.   Short: Review home exercise guidelines.  Long: Continue to increase activity.  Short: Start to add in at least one extra day a week at home.  Long: Continue to move more!!   [place] Name 12/08/17 1441 12/22/17 1438 01/01/18 1148 01/05/18 1305 01/21/18 1651     Exercise Goal Re-Evaluation   Exercise Goals Review  Increase Physical Activity;Increase Strength and Stamina;Understanding of Exercise Prescription  Increase Physical Activity;Increase Strength and Stamina;Understanding of Exercise Prescription  -  Increase Physical Activity;Increase Strength and Stamina;Understanding of Exercise Prescription  Increase Physical Activity;Increase Strength and Stamina;Able to understand and use rate of perceived exertion (RPE) scale;Able to understand and use Dyspnea scale;Knowledge and understanding of Target Heart Rate Range (THRR);Understanding of Exercise Prescription   Comments  Kathlee Nations has been doing well in rehab. She continues to try to walk more and is more diligent about watching to make sure her saturations do not drop. She is up to level 3 on the NuStep.  We will continue to monitor her progress.   Kathlee Nations continues to do well in rehab.  She continues to struggle with saturations dropping during exercise which makes it hard to  progress.  Her doctor has  limited her to just 6L.  We will continue to monitor her progress.  Kathlee Nations has not started her home exercise yet (walking at home). She wants to start after the holidays since it is a busy time of year for her.   Kathlee Nations is doing well in rehab.  She is getting in her 9 laps on the track.  Her saturations continue to drop with exercise which inhibits her progression.   We will continue to monitor her progress.   Kathlee Nations has progressed to 11 laps walking.  Her sats continue to drop at times.  Staff will monitor progress.   Expected Outcomes  Short: Continue to increase walking and move up on BioStep  Long: Continue to increase activity levels.   Short: Continue to walk more.  Long: Continue to increase strength and stamina.   Short: add one extra day of exercise. Long: become independent with exercise  Short: Continue to monitor saturations to try to increase workloads.  Long: Continue to increase strength and stamina.   Short - continue to attend and work on sat levels Long - maintain 02 at normal range during exercise   Row Name 02/01/18 1605             Exercise Goal Re-Evaluation   Exercise Goals Review  Increase Physical Activity;Increase Strength and Stamina;Understanding of Exercise Prescription       Comments  Kathlee Nations has been doing well in rehab.  She continues to struggle with desaturations.  As a result, we are not able to make too much progress. We will continue to monitor.        Expected Outcomes  Short: Increase seated equipment on good day.  Long: Continue to improve strength and stamina.           Nutrition & Weight - Outcomes: Pre Biometrics - 10/20/17 1238      Pre Biometrics   Height  _0  (1.626 m)    Weight  198 lb 11.2 oz (90.1 kg)    Waist Circumference  40 inches    Hip Circumference  45 inches    Waist to Hip Ratio  0.89 %    BMI (Calculated)  34.09    Single Leg Stand  22.07 seconds      Post Biometrics - 02/03/18 1201       Post  Biometrics   Height  _1  (1.626 m)     Weight  199 lb (90.3 kg)    Waist Circumference  38 inches    Hip Circumference  44 inches    Waist to Hip Ratio  0.86 %    BMI (Calculated)  34.14    Single Leg Stand  5.52 seconds       Nutrition: Nutrition Therapy & Goals - 11/04/17 1517      Nutrition Therapy   Diet  DM    Protein (specify units)  10oz    Fiber  25 grams    Whole Grain Foods  3 servings   eats brown rice   Saturated Fats  12 max. grams    Fruits and Vegetables  5 servings/day   8 ideal; has been eating less vegetables since she was put on oxygen therapy d/t eating out more   Sodium  1500 grams      Personal Nutrition Goals   Nutrition Goal  Make adjustments to your evening snack regimen. You can either work to reduce portions of your current snacks (try single  serve bags for example), or swap your current snack options for more nutrient dense options such as nuts, fruit, or peanut butter + low salt pretzels/ crackers    Personal Goal #2  Start to pay more attention to the amount of sodium you are consuming, especially because you have been eating out more frequently    Personal Goal #3  Work with your husband to start planning meals ahead of time using the ideas in the packet provided    Comments  She and her husband are both diabetics and are working to have a healthier eating plan. They have been eating out more often d/t pt not having the energy to cook at home. She feels she struggles with meal planning knowing how to pair foods together at meal times to make a complete/ nutritionally balanced meal. She does not drink sugar sweetened beverages and typically eats eggs for breakfast. She does not usually eat lunch and instead may snack on some fruit mid-day. She does not eat beef. Feels that night time snacking is one of her worst habits      Intervention Plan   Intervention  Prescribe, educate and counsel regarding individualized specific dietary modifications aiming towards targeted core components such as  weight, hypertension, lipid management, diabetes, heart failure and other comorbidities.;Nutrition handout(s) given to patient.   Quick and healthy meal ideas packet; Mediterranean diet informational packet   Expected Outcomes  Short Term Goal: A plan has been developed with personal nutrition goals set during dietitian appointment.;Long Term Goal: Adherence to prescribed nutrition plan.;Short Term Goal: Understand basic principles of dietary content, such as calories, fat, sodium, cholesterol and nutrients.       Nutrition Discharge: Nutrition Assessments - 10/20/17 1138      MEDFICTS Scores   Pre Score  69       Education Questionnaire Score: Knowledge Questionnaire Score - 02/03/18 1151      Knowledge Questionnaire Score   Pre Score  14/18    Post Score  16/18   reviewed with patient      Goals reviewed with patient; copy given to patient.

## 2018-02-17 ENCOUNTER — Encounter: Payer: 59 | Admitting: *Deleted

## 2018-02-17 DIAGNOSIS — J449 Chronic obstructive pulmonary disease, unspecified: Secondary | ICD-10-CM | POA: Diagnosis not present

## 2018-02-17 NOTE — Progress Notes (Signed)
Pulmonary Individual Treatment Plan  Patient Details  Name: Stephanie Short MRN: [id number] Date of Birth: 13-Jan-1951 Referring Provider:     Pulmonary Rehab from 10/20/2017 in [facility] Cardiac and Pulmonary Rehab  Referring Provider  [name]      Initial Encounter Date:    Pulmonary Rehab from 10/20/2017 in [facility] Cardiac and Pulmonary Rehab  Date  10/20/17      Visit Diagnosis: Chronic obstructive pulmonary disease, unspecified COPD type ([place])  Patient's Home Medications on Admission:  Current Outpatient Medications:  .  acetaminophen (TYLENOL) 325 MG tablet, Take 2 tablets (650 mg total) by mouth every 6 (six) hours as needed for mild pain or headache. (Patient taking differently: Take 500 mg by mouth every 6 (six) hours as needed for mild pain or headache. ), Disp: 30 tablet, Rfl: 0 .  albuterol (PROVENTIL HFA;VENTOLIN HFA) 108 (90 Base) MCG/ACT inhaler, Inhale 2 puffs into the lungs every 6 (six) hours as needed for wheezing or shortness of breath., Disp: , Rfl:  .  aspirin EC 81 MG tablet, Take 81 mg by mouth at bedtime., Disp: , Rfl:  .  Blood Glucose Monitoring Suppl (FIFTY50 GLUCOSE METER 2.0) w/Device KIT, Use as directed. Dx E11.22 ONE TOUCH, Disp: , Rfl:  .  carvedilol (COREG) 6.25 MG tablet, Take 6.25 mg by mouth 2 (two) times daily with a meal., Disp: , Rfl:  .  felodipine (PLENDIL) 5 MG 24 hr tablet, Take 1 tablet (5 mg total) by mouth every morning. (Patient taking differently: Take 10 mg by mouth daily. ), Disp: 30 tablet, Rfl: 2 .  furosemide (LASIX) 40 MG tablet, Take 1 tablet (40 mg total) by mouth daily., Disp: 30 tablet, Rfl: 2 .  glimepiride (AMARYL) 2 MG tablet, Take 2 mg by mouth daily with breakfast., Disp: , Rfl:  .  ipratropium-albuterol (DUONEB) 0.5-2.5 (3) MG/3ML SOLN, Take 3 mLs by nebulization every 6 (six) hours as needed (wheezing, shortness of breath). (Patient taking differently: Take 3 mLs by nebulization 3 (three) times daily. ), Disp: 360 mL, Rfl: 1 .   ondansetron (ZOFRAN ODT) 4 MG disintegrating tablet, Allow 1-2 tablets to dissolve in your mouth every 8 hours as needed for nausea/vomiting, Disp: 30 tablet, Rfl: 0 .  ONE TOUCH ULTRA TEST test strip, USE TO CHECK BLOOD SUGAR 1-2 TIMES DAILY AS DIRECTED. DX E11.22., Disp: , Rfl: 5 .  [START ON 02/24/2018] oxyCODONE (OXY IR/ROXICODONE) 5 MG immediate release tablet, Take 0.5 tablets (2.5 mg total) by mouth every 4 (four) hours as needed for severe pain., Disp: 90 tablet, Rfl: 0 .  oxyCODONE (OXY IR/ROXICODONE) 5 MG immediate release tablet, Take 0.5 tablets (2.5 mg total) by mouth every 4 (four) hours as needed for severe pain., Disp: 90 tablet, Rfl: 0 .  potassium chloride 20 MEQ TBCR, Take 20 mEq by mouth daily. While taking lasix, Disp: 30 tablet, Rfl: 2 .  sitaGLIPtin (JANUVIA) 100 MG tablet, Take 100 mg by mouth daily., Disp: , Rfl:  .  umeclidinium-vilanterol (ANORO ELLIPTA) 62.5-25 MCG/INH AEPB, Inhale 1 puff into the lungs daily. , Disp: , Rfl:  .  vitamin B-12 (CYANOCOBALAMIN) 1000 MCG tablet, Take 1 tablet (1,000 mcg total) by mouth daily., Disp: 30 tablet, Rfl: 1  Past Medical History: Past Medical History:  Diagnosis Date  . Asthma   . CHF (congestive heart failure) ([place])   . Depression   . Diabetes mellitus   . Hepatitis C    Dr. Staci Acosta pt took part in a study,  now cured  . Hypertension   . Insomnia   . Oxygen dependent     Tobacco Use: Social History   Tobacco Use  Smoking Status Former Smoker  . Packs/day: 1.00  . Years: 20.00  . Pack years: 20.00  . Types: Cigarettes  . Last attempt to quit: 01/21/2004  . Years since quitting: 14.0  Smokeless Tobacco Never Used    Labs: Recent Review Scientist, physiological    Labs for ITP Cardiac and Pulmonary Rehab Latest Ref Rng & Units 08/08/202009 02/06/2009 02/07/2009 05/24/2012 08/19/2017   Cholestrol 0 - 200 mg/dL - - 104 ATP III CLASSIFICATION: <200     mg/dL   Desirable 200-239  mg/dL   Borderline High >=240    mg/dL   High 82 -    LDLCALC 0 - 100 mg/dL - - 48 Total Cholesterol/HDL:CHD Risk Coronary Heart Disease Risk Table Men   Women 1/2 Average Risk   3.4   3.3 Average Risk       5.0   4.4 2 X Average Risk   9.6   7.1 3 X Average Risk  23.4   11.0 Use the calculated Patient Ratio above and the CHD Risk Table to determine the patient's CHD Risk. ATP III CLASSIFICATION (LDL): <100     mg/dL   Optimal 100-129  mg/dL   Near or Above Optimal 130-159  mg/dL   Borderline 160-189  mg/dL   High >190     mg/dL   Very High 38 -   HDL 40 - 60 mg/dL - - 45 31(L) -   Trlycerides 0 - 200 mg/dL - - 56 67 -   Hemoglobin A1c 4.8 - 5.6 % - 7.4 (NOTE) The ADA recommends the following therapeutic goal for glycemic control related to Hgb A1c measurement: Goal of therapy: <6.5 Hgb A1c  Reference: American Diabetes Association: Clinical Practice Recommendations 2010, Diabetes Care, 2010, 33: (Suppl 1).(H) - - 7.7(H)   HCO3 - 30.6(H) - - - -   TCO2 - 32 - - - -       Pulmonary Assessment Scores: Pulmonary Assessment Scores    Row Name 10/20/17 1135 02/03/18 1150       ADL UCSD   ADL Phase  Entry  Exit    SOB Score total  67  67    Rest  0  1    Walk  4  4    Stairs  5  4    Bath  4  4    Dress  5  4    Shop  3  3      CAT Score   CAT Score  19  28      mMRC Score   mMRC Score  3  4       Pulmonary Function Assessment: Pulmonary Function Assessment - 10/20/17 1134      Initial Spirometry Results   FVC%  48 %    FEV1%  55 %    FEV1/FVC Ratio  89.17    Comments  good patient effort      Post Bronchodilator Spirometry Results   FVC%  46.08 %    FEV1%  54.45 %    FEV1/FVC Ratio  92.09    Comments  good patient effort      Breath   Bilateral Breath Sounds  Clear    Shortness of Breath  Yes;Limiting activity;Panic with Shortness of Breath       Exercise Target Goals: Exercise  Program Goal: Individual exercise prescription set using results from initial 6 min walk test and THRR while considering   patient's activity barriers and safety.   Exercise Prescription Goal: Initial exercise prescription builds to 30-45 minutes a day of aerobic activity, 2-3 days per week.  Home exercise guidelines will be given to patient during program as part of exercise prescription that the participant will acknowledge.  Activity Barriers & Risk Stratification:   6 Minute Walk: 6 Minute Walk    Row Name 10/20/17 1239 02/03/18 1159       6 Minute Walk   Phase  Initial  Discharge    Distance  300 feet  220 feet    Distance % Change  -  -26.7 %    Distance Feet Change  -  -80 ft    Walk Time  2.75 minutes  1.93 minutes    # of Rest Breaks  0  0    MPH  1.23  1.29    METS  1.2  1.17    RPE  11  17    Perceived Dyspnea   3  3    VO2 Peak  4.2  4.11    Symptoms  No  Yes (comment)    Comments  -  SOB    Resting HR  92 bpm  94 bpm    Resting BP  114/64  148/74    Resting Oxygen Saturation   91 %  90 %    Exercise Oxygen Saturation  during 6 min walk  78 %  78 %    Max Ex. HR  120 bpm  122 bpm    Max Ex. BP  142/64  158/78    2 Minute Post BP  114/60  142/76      Interval HR   1 Minute HR  110  105    2 Minute HR  117  122    3 Minute HR  120  -    2 Minute Post HR  -  100    Interval Heart Rate?  Yes  Yes      Interval Oxygen   Interval Oxygen?  Yes  Yes    Baseline Oxygen Saturation %  91 %  90 %    1 Minute Oxygen Saturation %  84 %  84 %    1 Minute Liters of Oxygen  3 L pulsed  6 L    2 Minute Oxygen Saturation %  80 %  78 %    2 Minute Liters of Oxygen  3 L pulsed  6 L    3 Minute Oxygen Saturation %  78 %  -    3 Minute Liters of Oxygen  3 L pulsed  -    2 Minute Post Oxygen Saturation %  -  92 %    2 Minute Post Liters of Oxygen  -  6 L      Oxygen Initial Assessment: Oxygen Initial Assessment - 10/20/17 1133      Home Oxygen   Home Oxygen Device  Home Concentrator;Portable Concentrator;E-Tanks    Sleep Oxygen Prescription  CPAP    Liters per minute  2    Home Exercise  Oxygen Prescription  Continuous    Liters per minute  2    Home at Rest Exercise Oxygen Prescription  Continuous    Liters per minute  2    Compliance with Home Oxygen Use  Yes      Initial  6 min Walk   Oxygen Used  Pulsed;Portable Concentrator    Liters per minute  2      Program Oxygen Prescription   Program Oxygen Prescription  Continuous;E-Tanks    Liters per minute  2      Intervention   Short Term Goals  To learn and exhibit compliance with exercise, home and travel O2 prescription;To learn and understand importance of monitoring SPO2 with pulse oximeter and demonstrate accurate use of the pulse oximeter.;To learn and understand importance of maintaining oxygen saturations>88%;To learn and demonstrate proper pursed lip breathing techniques or other breathing techniques.;To learn and demonstrate proper use of respiratory medications    Long  Term Goals  Exhibits compliance with exercise, home and travel O2 prescription;Verbalizes importance of monitoring SPO2 with pulse oximeter and return demonstration;Maintenance of O2 saturations>88%;Exhibits proper breathing techniques, such as pursed lip breathing or other method taught during program session;Compliance with respiratory medication;Demonstrates proper use of MDI's       Oxygen Re-Evaluation: Oxygen Re-Evaluation    Row Name 10/23/17 1150 11/18/17 1356 11/18/17 1417 12/25/17 1209 01/18/18 1502     Program Oxygen Prescription   Program Oxygen Prescription  -  Continuous;E-Tanks  Continuous;E-Tanks  Continuous;E-Tanks  Continuous;E-Tanks   Liters per minute  -  4  -  4  4   Comments  -  6 liters when walking  6 liters when walking  6 liters when walking  6 liters when walking     Home Oxygen   Home Oxygen Device  -  Home Concentrator;Portable Concentrator;E-Tanks  Home Concentrator;Portable Concentrator;E-Tanks  Home Concentrator;Portable Concentrator;E-Tanks  Home Concentrator;Portable Concentrator;E-Tanks   Sleep Oxygen  Prescription  -  CPAP  CPAP  CPAP  CPAP   Liters per minute  -  _0 Home Exercise Oxygen Prescription  -  Continuous  Continuous  Continuous  Continuous   Liters per minute  -  _1 Home at Rest Exercise Oxygen Prescription  -  Continuous  Continuous  Continuous  Continuous   Liters per minute  -  _2 Compliance with Home Oxygen Use  -  Yes  Yes  Yes  Yes     Goals/Expected Outcomes   Short Term Goals  -  To learn and exhibit compliance with exercise, home and travel O2 prescription;To learn and understand importance of monitoring SPO2 with pulse oximeter and demonstrate accurate use of the pulse oximeter.;To learn and understand importance of maintaining oxygen saturations>88%;To learn and demonstrate proper pursed lip breathing techniques or other breathing techniques.;To learn and demonstrate proper use of respiratory medications  To learn and understand importance of maintaining oxygen saturations>88%;To learn and understand importance of monitoring SPO2 with pulse oximeter and demonstrate accurate use of the pulse oximeter.;To learn and exhibit compliance with exercise, home and travel O2 prescription  To learn and understand importance of maintaining oxygen saturations>88%;To learn and understand importance of monitoring SPO2 with pulse oximeter and demonstrate accurate use of the pulse oximeter.;To learn and exhibit compliance with exercise, home and travel O2 prescription  To learn and understand importance of maintaining oxygen saturations>88%;To learn and understand importance of monitoring SPO2 with pulse oximeter and demonstrate accurate use of the pulse oximeter.;To learn and exhibit compliance with exercise, home and travel O2 prescription   Long  Term Goals  -  Exhibits compliance with exercise, home and travel O2 prescription;Verbalizes importance of  monitoring SPO2 with pulse oximeter and return demonstration;Maintenance of O2 saturations>88%;Exhibits proper  breathing techniques, such as pursed lip breathing or other method taught during program session;Compliance with respiratory medication;Demonstrates proper use of MDI's  Exhibits compliance with exercise, home and travel O2 prescription;Verbalizes importance of monitoring SPO2 with pulse oximeter and return demonstration;Maintenance of O2 saturations>88%  Exhibits compliance with exercise, home and travel O2 prescription;Verbalizes importance of monitoring SPO2 with pulse oximeter and return demonstration;Maintenance of O2 saturations>88%  Exhibits compliance with exercise, home and travel O2 prescription;Verbalizes importance of monitoring SPO2 with pulse oximeter and return demonstration;Maintenance of O2 saturations>88%   Comments  Reviewed PLB technique with pt.  Talked about how it work and it's important to maintaining his exercise saturations.    Practiced PLB with patient. Patient understands why PLB is important and to use it when she is short of breath. Stephanie Short states that her doctor wants to put her on a respiratory medication that she has to qualify for and if not she may be looking to get a lung transplant. At rest her oxygen is around 95 percent at home. She needs to be on 6 liters when she is walking. On exertion her oxygen gets in the mid to low 80's but come back to the low 90 range with rest. Reviewed that oxygen saturations should be 88 percent and above. Patient has a pulse oximeter at home to check her oxygen.  Stephanie Short went to see Dr. [name] yesterday and was okayed to increase to 4-5L for oxygen when exerting, but she needs 6L to maintain saturations above 88%. We will send another note if this does not post in progress notes from office visit.  We also talked about using her e-cylinders for home exercise versus her portable concentrator as it does not give her enough flow.   Diaphragmatic and PLB breathing explained and performed with patient. Patient has a better understanding of how to do these  exercises to help with breathing performance and relaxation. Patient performed breathing techniques adequately and to practice further at home.  Informed patient that she may be using to little oxygen when she is on her concentrator. Verbalized to patient that she should talk to her pulmonologist to see if she needs to be on more oxygen when at rest. She states she can use 3-4 liters at rest. Patient has come into class wearing 2-3 liters of oxygen wth an SpO2 of 73 percent. Informed her to wear 4 liters on her home oxygen and to make sure she is 88 percent or above at home.   Goals/Expected Outcomes  Short: Become more profiecient at using PLB.   Long: Become independent at using PLB.  Short: use PLB with exertion. Long: use PLB on exertion proficiently and independently.  Short: Increase oxygen flow at home. Long: Continue to be compliant.   Short: practice PLB and diaphragmatic breathing at home. Long: Use PLB and diaphragmatic breathing independently post [employer].  Short: check oxygen more frequently at home. Long: maintain oxygen above 88 percent at home independently.      Oxygen Discharge (Final Oxygen Re-Evaluation): Oxygen Re-Evaluation - 01/18/18 1502      Program Oxygen Prescription   Program Oxygen Prescription  Continuous;E-Tanks    Liters per minute  4    Comments  6 liters when walking      Home Oxygen   Home Oxygen Device  Home Concentrator;Portable Concentrator;E-Tanks    Sleep Oxygen Prescription  CPAP    Liters per minute  2  Home Exercise Oxygen Prescription  Continuous    Liters per minute  5    Home at Rest Exercise Oxygen Prescription  Continuous    Liters per minute  2    Compliance with Home Oxygen Use  Yes      Goals/Expected Outcomes   Short Term Goals  To learn and understand importance of maintaining oxygen saturations>88%;To learn and understand importance of monitoring SPO2 with pulse oximeter and demonstrate accurate use of the pulse oximeter.;To learn and  exhibit compliance with exercise, home and travel O2 prescription    Long  Term Goals  Exhibits compliance with exercise, home and travel O2 prescription;Verbalizes importance of monitoring SPO2 with pulse oximeter and return demonstration;Maintenance of O2 saturations>88%    Comments  Informed patient that she may be using to little oxygen when she is on her concentrator. Verbalized to patient that she should talk to her pulmonologist to see if she needs to be on more oxygen when at rest. She states she can use 3-4 liters at rest. Patient has come into class wearing 2-3 liters of oxygen wth an SpO2 of 73 percent. Informed her to wear 4 liters on her home oxygen and to make sure she is 88 percent or above at home.    Goals/Expected Outcomes  Short: check oxygen more frequently at home. Long: maintain oxygen above 88 percent at home independently.       Initial Exercise Prescription: Initial Exercise Prescription - 10/20/17 1200      Date of Initial Exercise RX and Referring Provider   Date  10/20/17    Referring Provider  [name]      Oxygen   Oxygen  Continuous    Liters  3      NuStep   Level  1    SPM  80    Minutes  15    METs  1.2      Biostep-RELP   Level  1    SPM  50    Minutes  15    METs  1      Track   Laps  10    Minutes  15    METs  1.3      Prescription Details   Frequency (times per week)  3    Duration  Progress to 45 minutes of aerobic exercise without signs/symptoms of physical distress      Intensity   THRR 40-80% of Max Heartrate  116-140    Ratings of Perceived Exertion  11-13    Perceived Dyspnea  0-4      Resistance Training   Training Prescription  Yes    Weight  2 lb    Reps  10-15       Perform Capillary Blood Glucose checks as needed.  Exercise Prescription Changes: Exercise Prescription Changes    Row Name 10/26/17 1600 11/11/17 1400 11/24/17 1500 11/25/17 1400 12/08/17 1400     Response to Exercise   Blood Pressure (Admit)   102/60  138/84  124/70  -  134/70   Blood Pressure (Exit)  120/70  124/82  128/72  -  112/68   Heart Rate (Admit)  98 bpm  97 bpm  109 bpm  -  112 bpm   Heart Rate (Exercise)  117 bpm  115 bpm  123 bpm  -  115 bpm   Heart Rate (Exit)  109 bpm  104 bpm  98 bpm  -  98 bpm   Oxygen Saturation (Admit)  90 %  93 %  89 %  -  83 %   Oxygen Saturation (Exercise)  87 %  87 %  87 %  -  82 %   Oxygen Saturation (Exit)  91 %  89 %  89 %  -  96 %   Rating of Perceived Exertion (Exercise)  _0 -  13   Perceived Dyspnea (Exercise)  _1 -  2   Symptoms  desaturations  desaturations  desaturations  -  desaturations   Comments  second full day of exercise  -  -  -  -   Duration  Progress to 45 minutes of aerobic exercise without signs/symptoms of physical distress  Progress to 45 minutes of aerobic exercise without signs/symptoms of physical distress  Progress to 45 minutes of aerobic exercise without signs/symptoms of physical distress continues to need rest breaks on the track  -  Progress to 45 minutes of aerobic exercise without signs/symptoms of physical distress continues to need rest breaks on the track   Intensity  THRR unchanged  THRR unchanged  THRR unchanged  -  THRR unchanged     Progression   Progression  Continue to progress workloads to maintain intensity without signs/symptoms of physical distress.  Continue to progress workloads to maintain intensity without signs/symptoms of physical distress.  Continue to progress workloads to maintain intensity without signs/symptoms of physical distress.  -  Continue to progress workloads to maintain intensity without signs/symptoms of physical distress.   Average METs  1.9  2.47  2.21  -  2.07     Resistance Training   Training Prescription  Yes  Yes  Yes  -  Yes   Weight  2 lbs  2 lbs  2 lbs  -  2 lbs   Reps  10-15  10-15  10-15  -  10-15     Interval Training   Interval Training  No  No  No  -  No     Oxygen   Oxygen  Continuous   Continuous  Continuous  -  Continuous   Liters  _2 -  6     NuStep   Level  _3 -  3   Minutes  _4 -  15   METs  2.2  3.8  2.8  -  2.7     Biostep-RELP   Level  _5 -  2   Minutes  _6 -  15   METs  _7 -  2     Track   Laps  _8 -  11   Minutes  _9 -  15   METs  1.6  1.6  1.84  -  1.5     Home Exercise Plan   Plans to continue exercise at  -  -  -  Home (comment) walking, gym up the street  Home (comment) walking, gym up the street   Frequency  -  -  -  Add 1 additional day to program exercise sessions.  Add 1 additional day to program exercise sessions.   Initial Home Exercises Provided  -  -  -  11/25/17  11/25/17   Row Name 12/22/17 1400 01/05/18 1300  01/21/18 1600 02/01/18 1600       Response to Exercise   Blood Pressure (Admit)  122/70  130/82  130/62  134/68    Blood Pressure (Exit)  122/82  116/60  128/60  126/62    Heart Rate (Admit)  111 bpm  121 bpm  116 bpm  99 bpm    Heart Rate (Exercise)  117 bpm  115 bpm  108 bpm  118 bpm    Heart Rate (Exit)  96 bpm  103 bpm  108 bpm  99 bpm    Oxygen Saturation (Admit)  89 %  81 %  84 %  90 %    Oxygen Saturation (Exercise)  83 %  85 %  86 %  85 %    Oxygen Saturation (Exit)  92 %  96 %  92 %  92 %    Rating of Perceived Exertion (Exercise)  _0 Perceived Dyspnea (Exercise)  _1 Symptoms  desaturations  desaturations  desats  desats    Duration  Progress to 45 minutes of aerobic exercise without signs/symptoms of physical distress continues to need rest breaks on the track  Progress to 45 minutes of aerobic exercise without signs/symptoms of physical distress continues to need rest breaks on the track  Progress to 45 minutes of aerobic exercise without signs/symptoms of physical distress rests walking  Progress to 45 minutes of aerobic exercise without signs/symptoms of physical distress rests walking    Intensity  THRR unchanged  THRR unchanged  THRR  unchanged  THRR unchanged      Progression   Progression  Continue to progress workloads to maintain intensity without signs/symptoms of physical distress.  Continue to progress workloads to maintain intensity without signs/symptoms of physical distress.  Continue to progress workloads to maintain intensity without signs/symptoms of physical distress.  Continue to progress workloads to maintain intensity without signs/symptoms of physical distress.    Average METs  2  1.87  1.96  1.53      Resistance Training   Training Prescription  Yes  Yes  Yes  Yes    Weight  2 lbs  3 lbs  3 lb  3 lbs    Reps  10-15  10-15  10-15  10-15      Interval Training   Interval Training  No  No  -  No      Oxygen   Oxygen  Continuous  Continuous  -  Continuous    Liters  6  6  -  6      Treadmill   MPH  -  -  -  0.5    Grade  -  -  -  0    Minutes  -  -  -  15    METs  -  -  -  1.38      NuStep   Level  3  3  -  3    Minutes  15  15  -  15    METs  2.4  2.2  -  2.2      Biostep-RELP   Level  2  2  -  2    Minutes  15  15  -  15    METs  2  2  -  1      Track   Laps  12  9  -  -  Minutes  15  15  -  -    METs  1.6  1.4  -  -      Home Exercise Plan   Plans to continue exercise at  Home (comment) walking, gym up the street  Home (comment) walking, gym up the street  -  Home (comment) walking, gym up the street    Frequency  Add 1 additional day to program exercise sessions.  Add 1 additional day to program exercise sessions.  -  Add 1 additional day to program exercise sessions.    Initial Home Exercises Provided  11/25/17  11/25/17  -  11/25/17       Exercise Comments: Exercise Comments    Row Name 11/18/17 1420 02/17/18 1134         Exercise Comments  Stephanie Short went to see Dr. [name] yesterday and was okayed to increase to 4-5L for oxygen when exerting, but she needs 6L to maintain saturations above 88%. We will send another note if this does not post in progress notes from office visit.   We also talked about using her e-cylinders for home exercise versus her portable concentrator as it does not give her enough flow.    Jemma graduated today from  rehab with 36 sessions completed.  Details of the patient's exercise prescription and what She needs to do in order to continue the prescription and progress were discussed with patient.  Patient was given a copy of prescription and goals.  Patient verbalized understanding.  Simrat plans to continue to exercise by joining [employer].         Exercise Goals and Review: Exercise Goals    Row Name 10/20/17 1238             Exercise Goals   Increase Physical Activity  Yes       Intervention  Provide advice, education, support and counseling about physical activity/exercise needs.;Develop an individualized exercise prescription for aerobic and resistive training based on initial evaluation findings, risk stratification, comorbidities and participant's personal goals.       Expected Outcomes  Short Term: Attend rehab on a regular basis to increase amount of physical activity.;Long Term: Add in home exercise to make exercise part of routine and to increase amount of physical activity.;Long Term: Exercising regularly at least 3-5 days a week.       Increase Strength and Stamina  Yes       Intervention  Provide advice, education, support and counseling about physical activity/exercise needs.;Develop an individualized exercise prescription for aerobic and resistive training based on initial evaluation findings, risk stratification, comorbidities and participant's personal goals.       Expected Outcomes  Short Term: Increase workloads from initial exercise prescription for resistance, speed, and METs.;Short Term: Perform resistance training exercises routinely during rehab and add in resistance training at home;Long Term: Improve cardiorespiratory fitness, muscular endurance and strength as measured by increased METs and functional capacity  (6MWT)       Able to understand and use rate of perceived exertion (RPE) scale  Yes       Intervention  Provide education and explanation on how to use RPE scale       Expected Outcomes  Short Term: Able to use RPE daily in rehab to express subjective intensity level;Long Term:  Able to use RPE to guide intensity level when exercising independently       Able to understand and use Dyspnea scale  Yes  Intervention  Provide education and explanation on how to use Dyspnea scale       Expected Outcomes  Short Term: Able to use Dyspnea scale daily in rehab to express subjective sense of shortness of breath during exertion;Long Term: Able to use Dyspnea scale to guide intensity level when exercising independently       Knowledge and understanding of Target Heart Rate Range (THRR)  Yes       Intervention  Provide education and explanation of THRR including how the numbers were predicted and where they are located for reference       Expected Outcomes  Short Term: Able to state/look up THRR;Short Term: Able to use daily as guideline for intensity in rehab;Long Term: Able to use THRR to govern intensity when exercising independently       Able to check pulse independently  Yes       Intervention  Provide education and demonstration on how to check pulse in carotid and radial arteries.;Review the importance of being able to check your own pulse for safety during independent exercise       Expected Outcomes  Short Term: Able to explain why pulse checking is important during independent exercise;Long Term: Able to check pulse independently and accurately       Understanding of Exercise Prescription  Yes       Intervention  Provide education, explanation, and written materials on patient's individual exercise prescription       Expected Outcomes  Short Term: Able to explain program exercise prescription;Long Term: Able to explain home exercise prescription to exercise independently          Exercise Goals  Re-Evaluation : Exercise Goals Re-Evaluation    [place] Name 10/23/17 1150 10/26/17 1640 11/11/17 1404 11/24/17 1549 11/25/17 1402     Exercise Goal Re-Evaluation   Exercise Goals Review  Increase Physical Activity;Increase Strength and Stamina;Able to understand and use Dyspnea scale;Able to understand and use rate of perceived exertion (RPE) scale;Understanding of Exercise Prescription;Knowledge and understanding of Target Heart Rate Range (THRR)  Increase Physical Activity;Increase Strength and Stamina;Understanding of Exercise Prescription  Increase Physical Activity;Increase Strength and Stamina;Understanding of Exercise Prescription  Increase Physical Activity;Increase Strength and Stamina;Understanding of Exercise Prescription  Increase Physical Activity;Increase Strength and Stamina;Understanding of Exercise Prescription;Able to understand and use rate of perceived exertion (RPE) scale;Able to understand and use Dyspnea scale;Knowledge and understanding of Target Heart Rate Range (THRR);Able to check pulse independently   Comments  Reviewed RPE scale, THR and program prescription with pt today.  Pt voiced understanding and was given a copy of goals to take home.   Stephanie Short has completed two full days of exercise.  She continues to struggle with maintaining her saturations, especially while walking.  Note was sent to Dr. [name] to increase her oxygen flow rate during exercise.  We will continue to increase her time of exercise and monitor her progress.   Stephanie Short is doing well in rehab. She is now up to 14 laps while walking.  She still has not gotten permission to increase her oxygen flow for home use. We will send the request over again.   Once cleared for a high oxygen flow, we will review her home exercise guidelines.  We will begin to increase her other pieces of equipment and continue to monitor her progress.   Stephanie Short has continued to do well in rehab.  She now has a high flow cannula for her 6L for exercise.   She is still working  on getting a higher flow portable device but she does have e-cylinders she could use. We were going to go over her home exercise guidelines on her next visit but she has missed the last two sessions.  She is up to 18 laps and level 2 on the BioStep.  We will continue to monitor her progress.   Reviewed home exercise with pt today.  Pt plans to walk and go to gym up the street for exercise.  Reviewed THR, pulse, RPE, sign and symptoms, and when to call 911 or MD.  Also discussed weather considerations and indoor options.  Pt voiced understanding.   Expected Outcomes  Short: Use RPE daily to regulate intensity. Long: Follow program prescription in THR.  Short: Continue to attend reguarly.  Long: Continue to follow program prescription.   Short: Attend regularly and get clearance for higher oxygen flow rate.  Long: Continue to increase activity levels.   Short: Review home exercise guidelines.  Long: Continue to increase activity.  Short: Start to add in at least one extra day a week at home.  Long: Continue to move more!!   [place] Name 12/08/17 1441 12/22/17 1438 01/01/18 1148 01/05/18 1305 01/21/18 1651     Exercise Goal Re-Evaluation   Exercise Goals Review  Increase Physical Activity;Increase Strength and Stamina;Understanding of Exercise Prescription  Increase Physical Activity;Increase Strength and Stamina;Understanding of Exercise Prescription  -  Increase Physical Activity;Increase Strength and Stamina;Understanding of Exercise Prescription  Increase Physical Activity;Increase Strength and Stamina;Able to understand and use rate of perceived exertion (RPE) scale;Able to understand and use Dyspnea scale;Knowledge and understanding of Target Heart Rate Range (THRR);Understanding of Exercise Prescription   Comments  Stephanie Short has been doing well in rehab. She continues to try to walk more and is more diligent about watching to make sure her saturations do not drop. She is up to level 3 on the  NuStep.  We will continue to monitor her progress.   Stephanie Short continues to do well in rehab.  She continues to struggle with saturations dropping during exercise which makes it hard to progress.  Her doctor has limited her to just 6L.  We will continue to monitor her progress.  Stephanie Short has not started her home exercise yet (walking at home). She wants to start after the holidays since it is a busy time of year for her.   Stephanie Short is doing well in rehab.  She is getting in her 9 laps on the track.  Her saturations continue to drop with exercise which inhibits her progression.   We will continue to monitor her progress.   Stephanie Short has progressed to 11 laps walking.  Her sats continue to drop at times.  Staff will monitor progress.   Expected Outcomes  Short: Continue to increase walking and move up on BioStep  Long: Continue to increase activity levels.   Short: Continue to walk more.  Long: Continue to increase strength and stamina.   Short: add one extra day of exercise. Long: become independent with exercise  Short: Continue to monitor saturations to try to increase workloads.  Long: Continue to increase strength and stamina.   Short - continue to attend and work on sat levels Long - maintain 02 at normal range during exercise   [place] Name 02/01/18 1605             Exercise Goal Re-Evaluation   Exercise Goals Review  Increase Physical Activity;Increase Strength and Stamina;Understanding of Exercise Prescription  Comments  Stephanie Short has been doing well in rehab.  She continues to struggle with desaturations.  As a result, we are not able to make too much progress. We will continue to monitor.        Expected Outcomes  Short: Increase seated equipment on good day.  Long: Continue to improve strength and stamina.           Discharge Exercise Prescription (Final Exercise Prescription Changes): Exercise Prescription Changes - 02/01/18 1600      Response to Exercise   Blood Pressure (Admit)  134/68    Blood Pressure (Exit)   126/62    Heart Rate (Admit)  99 bpm    Heart Rate (Exercise)  118 bpm    Heart Rate (Exit)  99 bpm    Oxygen Saturation (Admit)  90 %    Oxygen Saturation (Exercise)  85 %    Oxygen Saturation (Exit)  92 %    Rating of Perceived Exertion (Exercise)  13    Perceived Dyspnea (Exercise)  2    Symptoms  desats    Duration  Progress to 45 minutes of aerobic exercise without signs/symptoms of physical distress   rests walking   Intensity  THRR unchanged      Progression   Progression  Continue to progress workloads to maintain intensity without signs/symptoms of physical distress.    Average METs  1.53      Resistance Training   Training Prescription  Yes    Weight  3 lbs    Reps  10-15      Interval Training   Interval Training  No      Oxygen   Oxygen  Continuous    Liters  6      Treadmill   MPH  0.5    Grade  0    Minutes  15    METs  1.38      NuStep   Level  3    Minutes  15    METs  2.2      Biostep-RELP   Level  2    Minutes  15    METs  1      Home Exercise Plan   Plans to continue exercise at  Home (comment)   walking, gym up the street   Frequency  Add 1 additional day to program exercise sessions.    Initial Home Exercises Provided  11/25/17       Nutrition:  Target Goals: Understanding of nutrition guidelines, daily intake of sodium <1538m, cholesterol <2086m calories 30% from fat and 7% or less from saturated fats, daily to have 5 or more servings of fruits and vegetables.  Biometrics: Pre Biometrics - 10/20/17 1238      Pre Biometrics   Height  _0  (1.626 m)    Weight  198 lb 11.2 oz (90.1 kg)    Waist Circumference  40 inches    Hip Circumference  45 inches    Waist to Hip Ratio  0.89 %    BMI (Calculated)  34.09    Single Leg Stand  22.07 seconds      Post Biometrics - 02/03/18 1201       Post  Biometrics   Height  _1  (1.626 m)    Weight  199 lb (90.3 kg)    Waist Circumference  38 inches    Hip Circumference  44 inches     Waist to Hip Ratio  0.86 %  BMI (Calculated)  34.14    Single Leg Stand  5.52 seconds       Nutrition Therapy Plan and Nutrition Goals: Nutrition Therapy & Goals - 11/04/17 1517      Nutrition Therapy   Diet  DM    Protein (specify units)  10oz    Fiber  25 grams    Whole Grain Foods  3 servings   eats brown rice   Saturated Fats  12 max. grams    Fruits and Vegetables  5 servings/day   8 ideal; has been eating less vegetables since she was put on oxygen therapy d/t eating out more   Sodium  1500 grams      Personal Nutrition Goals   Nutrition Goal  Make adjustments to your evening snack regimen. You can either work to reduce portions of your current snacks (try single serve bags for example), or swap your current snack options for more nutrient dense options such as nuts, fruit, or peanut butter + low salt pretzels/ crackers    Personal Goal #2  Start to pay more attention to the amount of sodium you are consuming, especially because you have been eating out more frequently    Personal Goal #3  Work with your husband to start planning meals ahead of time using the ideas in the packet provided    Comments  She and her husband are both diabetics and are working to have a healthier eating plan. They have been eating out more often d/t pt not having the energy to cook at home. She feels she struggles with meal planning knowing how to pair foods together at meal times to make a complete/ nutritionally balanced meal. She does not drink sugar sweetened beverages and typically eats eggs for breakfast. She does not usually eat lunch and instead may snack on some fruit mid-day. She does not eat beef. Feels that night time snacking is one of her worst habits      Intervention Plan   Intervention  Prescribe, educate and counsel regarding individualized specific dietary modifications aiming towards targeted core components such as weight, hypertension, lipid management, diabetes, heart failure  and other comorbidities.;Nutrition handout(s) given to patient.   Quick and healthy meal ideas packet; Mediterranean diet informational packet   Expected Outcomes  Short Term Goal: A plan has been developed with personal nutrition goals set during dietitian appointment.;Long Term Goal: Adherence to prescribed nutrition plan.;Short Term Goal: Understand basic principles of dietary content, such as calories, fat, sodium, cholesterol and nutrients.       Nutrition Assessments: Nutrition Assessments - 10/20/17 1138      MEDFICTS Scores   Pre Score  69       Nutrition Goals Re-Evaluation: Nutrition Goals Re-Evaluation    [place] Name 11/04/17 1532 11/25/17 1401 12/28/17 1218         Goals   Nutrition Goal  Make adjustments to your evening snack regimen. You can either work to reduce portions of your current snacks (try single serve bags for example), or swap your current snack options for more nutrient dense options such as nuts, fruit, or peanut butter + low salt pretzels/ crackers  Make adjustments to your evening snack regimen by reducing portions or swapping current choices for more nutritious foods; work with your husband to start planning meals ahead of time; start to pay attention to the sodium content in foods  Make adjustments to your evening snack regimen by reducing portions and/or swapping current choices for more nutritious  foods; work with your husband to plan meals ahead of time which will allow you to eat at home more frequently     Comment  She feels that her evening snacking habits are setting her back from working towards her health and weight loss goals  She has been choosing snack options like nuts, fruit and baked chips (single-serve bags) rather than her traditional snack options. He husband has started to cook more leading to eating less meals eaten outside of the home. He is trying to bake rather than fry and chose leaner meats (chicken, crab, fish, lean pork). She continues not  to drink sugar sweetened beverages. Does not usually eat lunch  She has been choosing evening snacks like baked chips, fruit and pistaccios but still reports portions to be an issue at times. Her husband has been "doing good" with cooking for them more often. She reports two episodes this past weekend where her BG dropped to 70 but was able to correct it with a piece of chocolate     Expected Outcome  She will reduce the amount of late night snacks she consumes and/or make more nutritious food choices when choosing to snack after dinner. She will work on portion control in this area  She will continue to make more health-conscious snack choices and keep portions more controlled. She will continue to work with her husband to prepare more heart-healthy meals at home and choose lean meats most often as protein sources. When/if she is able to come off of oxygen therapy she will start to assist her husband in meal preparation  She will continue to work on being mindful of portions for snacks and she and her husband will continue to eat more meals at home. She will work on eating on a regular time schedule (not skipping her 3rd meal) to prevent episodes of low blood sugar       Personal Goal #2 Re-Evaluation   Personal Goal #2  Start to pay attention to the amount of sodium you are consuming, especially because you have been eating out more frequently  -  -       Personal Goal #3 Re-Evaluation   Personal Goal #3  Work with your husband to start planning meals ahead of time by using the ideas in the packet provided  -  -        Nutrition Goals Discharge (Final Nutrition Goals Re-Evaluation): Nutrition Goals Re-Evaluation - 12/28/17 1218      Goals   Nutrition Goal  Make adjustments to your evening snack regimen by reducing portions and/or swapping current choices for more nutritious foods; work with your husband to plan meals ahead of time which will allow you to eat at home more frequently    Comment  She  has been choosing evening snacks like baked chips, fruit and pistaccios but still reports portions to be an issue at times. Her husband has been "doing good" with cooking for them more often. She reports two episodes this past weekend where her BG dropped to 70 but was able to correct it with a piece of chocolate    Expected Outcome  She will continue to work on being mindful of portions for snacks and she and her husband will continue to eat more meals at home. She will work on eating on a regular time schedule (not skipping her 3rd meal) to prevent episodes of low blood sugar       Psychosocial: Target Goals: Acknowledge presence or absence  of significant depression and/or stress, maximize coping skills, provide positive support system. Participant is able to verbalize types and ability to use techniques and skills needed for reducing stress and depression.   Initial Review & Psychosocial Screening: Initial Psych Review & Screening - 10/20/17 1138      Initial Review   Current issues with  Current Stress Concerns;Current Depression    Source of Stress Concerns  Chronic Illness;Unable to perform yard/household activities    Comments  Her COPD and shortness of breath is what stresses her out the most. She cannot walk as far and do the things she use to do.       Family Dynamics   Good Support System?  Yes    Comments  She can look to her husband and gradnson for support.      Barriers   Psychosocial barriers to participate in program  The patient should benefit from training in stress management and relaxation.      Screening Interventions   Interventions  Encouraged to exercise;Program counselor consult;To provide support and resources with identified psychosocial needs;Provide feedback about the scores to participant    Expected Outcomes  Short Term goal: Utilizing psychosocial counselor, staff and physician to assist with identification of specific Stressors or current issues interfering  with healing process. Setting desired goal for each stressor or current issue identified.;Long Term Goal: Stressors or current issues are controlled or eliminated.;Short Term goal: Identification and review with participant of any Quality of Life or Depression concerns found by scoring the questionnaire.;Long Term goal: The participant improves quality of Life and PHQ9 Scores as seen by post scores and/or verbalization of changes       Quality of Life Scores:  Scores of 19 and below usually indicate a poorer quality of life in these areas.  A difference of  2-3 points is a clinically meaningful difference.  A difference of 2-3 points in the total score of the Quality of Life Index has been associated with significant improvement in overall quality of life, self-image, physical symptoms, and general health in studies assessing change in quality of life.  PHQ-9: Recent Review Flowsheet Data    Depression screen [facility] 2/9 02/03/2018 01/18/2018 12/21/2017 12/02/2017 10/20/2017   Decreased Interest _0 0   Down, Depressed, Hopeless _1 PHQ - 2 Score _2 Altered sleeping 0 0 _3 Tired, decreased energy _4 Change in appetite _5 Feeling bad or failure about yourself  1 2 0 1 3   Trouble concentrating 0 0 0 0 0   Moving slowly or fidgety/restless 1 1 0 1 3   Suicidal thoughts 0 0 0 0 0   PHQ-9 Score _6 Difficult doing work/chores Somewhat difficult Somewhat difficult Somewhat difficult Very difficult Extremely dIfficult     Interpretation of Total Score  Total Score Depression Severity:  1-4 = Minimal depression, 5-9 = Mild depression, 10-14 = Moderate depression, 15-19 = Moderately severe depression, 20-27 = Severe depression   Psychosocial Evaluation and Intervention: Psychosocial Evaluation - 10/26/17 1219      Psychosocial Evaluation & Interventions   Interventions  Relaxation education;Encouraged to exercise with the program and follow  exercise prescription    Comments  Counselor met with Stephanie Short Stephanie Short) for initial psychosocial evaluation.  She is a 68 year old  who struggles with COPD and she has diabetes as well.  Stephanie Short has a strong support system with a spouse of 1 years and a grandson who lives in the home.  She has (4) adult children who live out of state.  Stephanie Short reports sleeping well most of the time and has a good appetite.  She reports a history of depression diagnosed in 2013 and was treated with counseling.  She also reports some current anxiety symptoms and has a RX to take PRN - but reports taking it rarely.  Stephanie Short reports her mood is impacted by her health currently - and that is her primary stressor at this time.  Counselor discussed her PHQ-9 Score of "13" which indicates moderated symptoms of depression currently, with a report of feeling bad about herself and feeling down depressed or hopeless at times.  Counselor encouraged Stephanie Short to consider going back to counseling and she does not want to go to the same provider.  She also wants to see if this program will help with her mood.  Counselor suggested a follow up discussion in several weeks to see if her symptoms have improved.  Stephanie Short has goals to lose weight and just to "breathe better."    Expected Outcomes  Short:  Stephanie Short will meet with the dietician to address her weight loss goal.  She will also exercise to decrease her anxiety and improve her mood and ability to breathe better.  Long:  Stephanie Short will exercise consistently to improve her health and mental health    Continue Psychosocial Services   Follow up required by counselor       Psychosocial Re-Evaluation: Psychosocial Re-Evaluation    [place] Name 11/04/17 [place] 11/04/17 1710 12/02/17 1211 12/07/17 1231 12/21/17 1454     Psychosocial Re-Evaluation   Current issues with  Current Stress Concerns;History of Depression;Current Depression  -  Current Stress Concerns;History of Depression;Current Depression  Current Psychotropic  Meds;Current Stress Concerns;History of Depression  History of Depression;Current Depression;Current Stress Concerns   Comments  Counselor met with Stephanie Short and her spouse today for follow up.  She reports her mood and quality of life seem to both be getting worse.  Her spouse reports noticing less "spunk" and less energy in his wife.  She states her sleep has improved a little with the new CPAP over the past month and she may be getting approximately 6 hours of sleep now.  She has a history of grief/loss with a son that died tragically in an accident in 2013.  She went to counseling briefly immediately after this occurred but felt she "was not ready." and she also has takens some medication for anxiety - but not consistently so not very effective.  Counselor recommended Stephanie Short find another therapist to help her through this difficult time.  Also, counselor provided contact info on potential therapists and grief support groups for her.  She is encouraged to speak with her Dr. about a medication evaluation for her depressive symptoms as well in the near future.  Stephanie Short and her husband agreed to these recommendations.  Counselor will follow.    -  Reviewed patient health questionnaire (PHQ-9) with patient for follow up. Previously, patients score indicated signs/symptoms of depression.  Reviewed to see if patient is improving symptom wise while in program.  Score improved and patient states that it is because she has family in town. She has been going out more and it feels good for her to get out of the house.  Counselor follow up  with Stephanie Short today reporting she is sleeping so much better with greater ease getting to sleep and improved quality since she began her CPAP recently.  She reports having a little more "spunk" lately with her adult children coming 'home' to visit and she also reports she is learning to accept what she cannot change in her life better.  Stephanie Short is excited that she may be participating in a clinical trial later  this week and also may be reconsidered for a program at [facility] with a new Medication for COPD.  She is very hopeful about this.  Counselor commended Stephanie Short on her progress made and her hard work while in this program.    Reviewed patient health questionnaire (PHQ-9) with patient for follow up. Previously, patients score indicated signs/symptoms of depression.  Reviewed to see if patient is improving symptom wise while in program.  Score improved and patient states that it is because they have been exercising at [place]. She has been doing well and able to cope with her disease better since she started the program.   Expected Outcomes  -  Short:  Stephanie Short will contact a counselor and research the support group to help her during this difficult time emotionally.  She will contact her Dr. about a medication evaluation once she establishes a baseline with a counselor.  Long:  Stephanie Short will continue to practice positive self-care for her health and mental health.    Short: Continue to attend [facility] regularly for regular exercise and social engagement. Long: Continue to improve symptoms and manage a positive mental state  Short:  Stephanie Short will continue to exercise consistently and continue practicing ways to manage her stress more positively.  Long:  Stephanie Short will exercise and practice positive self-care for her improved quality of life.   Short: Continue to attend [facility] regularly for regular exercise and social engagement. Long: Continue to improve symptoms and manage a positive mental state   Interventions  -  -  Stress management education;Encouraged to attend [facility] for the exercise  -  Encouraged to attend [facility] for the exercise;Stress management education   Continue Psychosocial Services   Follow up required by counselor  -  Follow up required by staff  Follow up required by staff  Follow up required by staff   Row Name 01/18/18 1226 01/18/18 1500           Psychosocial Re-Evaluation    Current issues with  History of Depression;Current Depression  History of Depression;Current Depression      Comments  Counselor follow up with Stephanie Short today reporting she enjoyed the holidays with family helping more than in the past.  However, this also is a constant reminder that she is unable to do as much and this prompted some tears in [place] today as she discussed with counselor.  She reports her mood is a little better overall, and her core feels stronger now as she is able to do more if she paces herself.  Counselor offered supoort and commended Stephanie Short on her progress and on her awareness of her limitations and need for help at times.   Staff will continue to follow.    Reviewed patient health questionnaire (PHQ-9) with patient for follow up. Previously, patients score indicated signs/symptoms of depression.  Reviewed to see if patient is improving symptom wise while in program.  Score declined and patient states that it is because she is not able to breath like she wants to and feels like she is not improving.  Expected Outcomes  Short:  Stephanie Short will continue to practice pacing herself and positive coping strategies.  Long:  Stephanie Short will develop positive coping strategies including exercise for her health and mental health.    Short: Continue to work toward an improvement in [place] scores by attending [facility] regularly. Long: Continue to improve stress and depression coping skills by talking with staff and attending [facility] regularly and work toward a positive mental state.       Interventions  -  Encouraged to attend [facility] for the exercise;Stress management education      Continue Psychosocial Services   Follow up required by staff  Follow up required by staff         Psychosocial Discharge (Final Psychosocial Re-Evaluation): Psychosocial Re-Evaluation - 01/18/18 1500      Psychosocial Re-Evaluation   Current issues with  History of Depression;Current Depression    Comments  Reviewed  patient health questionnaire (PHQ-9) with patient for follow up. Previously, patients score indicated signs/symptoms of depression.  Reviewed to see if patient is improving symptom wise while in program.  Score declined and patient states that it is because she is not able to breath like she wants to and feels like she is not improving.    Expected Outcomes  Short: Continue to work toward an improvement in [place] scores by attending [facility] regularly. Long: Continue to improve stress and depression coping skills by talking with staff and attending [facility] regularly and work toward a positive mental state.     Interventions  Encouraged to attend [facility] Rehabilitation for the exercise;Stress management education    Continue Psychosocial Services   Follow up required by staff       Education: Education Goals: Education classes will be provided on a weekly basis, covering required topics. Participant will state understanding/return demonstration of topics presented.  Learning Barriers/Preferences: Learning Barriers/Preferences - 10/20/17 1138      Learning Barriers/Preferences   Learning Barriers  None    Learning Preferences  None       Education Topics:  Initial Evaluation Education: - Verbal, written and demonstration of respiratory meds, oximetry and breathing techniques. Instruction on use of nebulizers and MDIs and importance of monitoring MDI activations.   Pulmonary Rehab from 02/17/2018 in [facility] Cardiac and Pulmonary Rehab  Date  10/20/17  Educator  [facility]  Instruction Review Code  1- Verbalizes Understanding      General Nutrition Guidelines/Fats and Fiber: -Group instruction provided by verbal, written material, models and posters to present the general guidelines for heart healthy nutrition. Gives an explanation and review of dietary fats and fiber.   Pulmonary Rehab from 02/17/2018 in [facility] Cardiac and Pulmonary Rehab  Date  11/25/17  Educator  [facility]  Instruction Review Code  1-  Verbalizes Understanding      Controlling Sodium/Reading Food Labels: -Group verbal and written material supporting the discussion of sodium use in heart healthy nutrition. Review and explanation with models, verbal and written materials for utilization of the food label.   Pulmonary Rehab from 02/17/2018 in [facility] Cardiac and Pulmonary Rehab  Date  12/09/17  Educator  [facility]  Instruction Review Code  1- Verbalizes Understanding      Exercise Physiology & General Exercise Guidelines: - Group verbal and written instruction with models to review the exercise physiology of the cardiovascular system and associated critical values. Provides general exercise guidelines with specific guidelines to those with heart or lung disease.    Pulmonary Rehab from 02/17/2018 in [facility] Cardiac and  Pulmonary Rehab  Date  02/10/18  Educator  [facility]  Instruction Review Code  1- Verbalizes Understanding      Aerobic Exercise & Resistance Training: - Gives group verbal and written instruction on the various components of exercise. Focuses on aerobic and resistive training programs and the benefits of this training and how to safely progress through these programs.   Pulmonary Rehab from 02/17/2018 in [facility] Cardiac and Pulmonary Rehab  Date  02/12/18  Educator  [facility]  Instruction Review Code  1- Verbalizes Understanding      Flexibility, Balance, Mind/Body Relaxation: Provides group verbal/written instruction on the benefits of flexibility and balance training, including mind/body exercise modes such as yoga, pilates and tai chi.  Demonstration and skill practice provided.   Pulmonary Rehab from 02/17/2018 in [facility] Cardiac and Pulmonary Rehab  Date  02/17/18  Educator  [facility]  Instruction Review Code  1- Verbalizes Understanding      Stress and Anxiety: - Provides group verbal and written instruction about the health risks of elevated stress and causes of high stress.  Discuss the correlation between heart/lung disease and  anxiety and treatment options. Review healthy ways to manage with stress and anxiety.   Pulmonary Rehab from 02/17/2018 in [facility] Cardiac and Pulmonary Rehab  Date  12/02/17  Educator  [facility]  Instruction Review Code  1- Verbalizes Understanding      Depression: - Provides group verbal and written instruction on the correlation between heart/lung disease and depressed mood, treatment options, and the stigmas associated with seeking treatment.   Pulmonary Rehab from 02/17/2018 in [facility] Cardiac and Pulmonary Rehab  Date  11/04/17  Educator  [facility]  Instruction Review Code  1- Verbalizes Understanding      Exercise & Equipment Safety: - Individual verbal instruction and demonstration of equipment use and safety with use of the equipment.   Pulmonary Rehab from 02/17/2018 in [facility] Cardiac and Pulmonary Rehab  Date  10/20/17  Educator  [facility]  Instruction Review Code  1- Verbalizes Understanding      Infection Prevention: - Provides verbal and written material to individual with discussion of infection control including proper hand washing and proper equipment cleaning during exercise session.   Pulmonary Rehab from 02/17/2018 in [facility] Cardiac and Pulmonary Rehab  Date  10/20/17  Educator  [facility]  Instruction Review Code  1- Verbalizes Understanding      Falls Prevention: - Provides verbal and written material to individual with discussion of falls prevention and safety.   Pulmonary Rehab from 02/17/2018 in [facility] Cardiac and Pulmonary Rehab  Date  10/20/17  Educator  [facility]  Instruction Review Code  1- Verbalizes Understanding      Diabetes: - Individual verbal and written instruction to review signs/symptoms of diabetes, desired ranges of glucose level fasting, after meals and with exercise. Advice that pre and post exercise glucose checks will be done for 3 sessions at entry of program.   Chronic Lung Diseases: - Group verbal and written instruction to review updates, respiratory medications,  advancements in procedures and treatments. Discuss use of supplemental oxygen including available portable oxygen systems, continuous and intermittent flow rates, concentrators, personal use and safety guidelines. Review proper use of inhaler and spacers. Provide informative websites for self-education.    Pulmonary Rehab from 02/17/2018 in [facility] Cardiac and Pulmonary Rehab  Date  12/23/17  Educator  [facility]  Instruction Review Code  1- Verbalizes Understanding      Energy Conservation: - Provide group verbal and written instruction for methods  to conserve energy, plan and organize activities. Instruct on pacing techniques, use of adaptive equipment and posture/positioning to relieve shortness of breath.   Pulmonary Rehab from 02/17/2018 in [facility] Cardiac and Pulmonary Rehab  Date  11/18/17  Educator  [facility]  Instruction Review Code  1- Verbalizes Understanding      Triggers and Exacerbations: - Group verbal and written instruction to review types of environmental triggers and ways to prevent exacerbations. Discuss weather changes, air quality and the benefits of nasal washing. Review warning signs and symptoms to help prevent infections. Discuss techniques for effective airway clearance, coughing, and vibrations.   Pulmonary Rehab from 02/17/2018 in [facility] Cardiac and Pulmonary Rehab  Date  01/08/18  Educator  [facility]  Instruction Review Code  1- Verbalizes Understanding      AED/CPR: - Group verbal and written instruction with the use of models to demonstrate the basic use of the AED with the basic ABC's of resuscitation.   Pulmonary Rehab from 02/17/2018 in [facility] Cardiac and Pulmonary Rehab  Date  02/03/18  Educator  [facility]  Instruction Review Code  1- Actuary and Physiology of the Lungs: - Group verbal and written instruction with the use of models to provide basic lung anatomy and physiology related to function, structure and complications of lung disease.   Anatomy &  Physiology of the Heart: - Group verbal and written instruction and models provide basic cardiac anatomy and physiology, with the coronary electrical and arterial systems. Review of Valvular disease and Heart Failure   Pulmonary Rehab from 02/17/2018 in [facility] Cardiac and Pulmonary Rehab  Date  10/30/17  Educator  [facility]  Instruction Review Code  1- Verbalizes Understanding      Cardiac Medications: - Group verbal and written instruction to review commonly prescribed medications for heart disease. Reviews the medication, class of the drug, and side effects.   Pulmonary Rehab from 02/17/2018 in [facility] Cardiac and Pulmonary Rehab  Date  11/13/17  Educator  [facility]  Instruction Review Code  1- Verbalizes Understanding      Know Your Numbers and Risk Factors: -Group verbal and written instruction about important numbers in your health.  Discussion of what are risk factors and how they play a role in the disease process.  Review of Cholesterol, Blood Pressure, Diabetes, and BMI and the role they play in your overall health.   Pulmonary Rehab from 02/17/2018 in [facility] Cardiac and Pulmonary Rehab  Date  01/06/18  Educator  [facility]  Instruction Review Code  1- Verbalizes Understanding      Sleep Hygiene: -Provides group verbal and written instruction about how sleep can affect your health.  Define sleep hygiene, discuss sleep cycles and impact of sleep habits. Review good sleep hygiene tips.    Pulmonary Rehab from 02/17/2018 in [facility] Cardiac and Pulmonary Rehab  Date  01/27/18  Educator  [facility]  Instruction Review Code  1- Verbalizes Understanding      Other: -Provides group and verbal instruction on various topics (see comments)    Knowledge Questionnaire Score: Knowledge Questionnaire Score - 02/03/18 1151      Knowledge Questionnaire Score   Pre Score  14/18    Post Score  16/18   reviewed with patient       Core Components/Risk Factors/Patient Goals at Admission: Personal Goals and Risk Factors  at Admission - 10/20/17 1144      Core Components/Risk Factors/Patient Goals on Admission    Weight Management  Yes;Weight Loss  Intervention  Weight Management: Develop a combined nutrition and exercise program designed to reach desired caloric intake, while maintaining appropriate intake of nutrient and fiber, sodium and fats, and appropriate energy expenditure required for the weight goal.;Weight Management: Provide education and appropriate resources to help participant work on and attain dietary goals.;Weight Management/Obesity: Establish reasonable short term and long term weight goals.    Admit Weight  198 lb 11.2 oz (90.1 kg)    Goal Weight: Short Term  193 lb (87.5 kg)    Goal Weight: Long Term  150 lb (68 kg)    Expected Outcomes  Short Term: Continue to assess and modify interventions until short term weight is achieved;Long Term: Adherence to nutrition and physical activity/exercise program aimed toward attainment of established weight goal;Weight Loss: Understanding of general recommendations for a balanced deficit meal plan, which promotes 1-2 lb weight loss per week and includes a negative energy balance of [id number] kcal/d;Understanding recommendations for meals to include 15-35% energy as protein, 25-35% energy from fat, 35-60% energy from carbohydrates, less than 234m of dietary cholesterol, 20-35 gm of total fiber daily;Understanding of distribution of calorie intake throughout the day with the consumption of 4-5 meals/snacks    Improve shortness of breath with ADL's  Yes    Intervention  Provide education, individualized exercise plan and daily activity instruction to help decrease symptoms of SOB with activities of daily living.    Expected Outcomes  Short Term: Improve cardiorespiratory fitness to achieve a reduction of symptoms when performing ADLs;Long Term: Be able to perform more ADLs without symptoms or delay the onset of symptoms    Diabetes  Yes    Intervention  Provide  education about proper nutrition, including hydration, and aerobic/resistive exercise prescription along with prescribed medications to achieve blood glucose in normal ranges: Fasting glucose 65-99 mg/dL;Provide education about signs/symptoms and action to take for hypo/hyperglycemia.    Expected Outcomes  Long Term: Attainment of HbA1C < 7%.;Short Term: Participant verbalizes understanding of the signs/symptoms and immediate care of hyper/hypoglycemia, proper foot care and importance of medication, aerobic/resistive exercise and nutrition plan for blood glucose control.    Hypertension  Yes    Intervention  Provide education on lifestyle modifcations including regular physical activity/exercise, weight management, moderate sodium restriction and increased consumption of fresh fruit, vegetables, and low fat dairy, alcohol moderation, and smoking cessation.;Monitor prescription use compliance.    Expected Outcomes  Short Term: Continued assessment and intervention until BP is < 140/939mHG in hypertensive participants. < 130/8013mG in hypertensive participants with diabetes, heart failure or chronic kidney disease.;Long Term: Maintenance of blood pressure at goal levels.       Core Components/Risk Factors/Patient Goals Review:  Goals and Risk Factor Review    Row Name 12/02/17 1221 01/01/18 1148 01/27/18 1446         Core Components/Risk Factors/Patient Goals Review   Personal Goals Review  Weight Management/Obesity;Improve shortness of breath with ADL's;Hypertension;Diabetes  Weight Management/Obesity;Improve shortness of breath with ADL's;Hypertension;Diabetes  Weight Management/Obesity;Improve shortness of breath with ADL's;Hypertension;Diabetes     Review  Patient has been checking her blood sugar at home and has not been over 160. She has been checking her blood pressure at home and states it has been good. Her ADL's have not been getting any easier for her. She may be eligable for a lung  program to have other treatment options.  She is going in for a heart [place] next [place] is [place].  Her cath was clean, showed mild to moderate pulmonary hypertension. Stephanie Short's weight has not changed. She is still monitoring her blood sugar which has been running 140s-160s. She is still experiencing tiredness, but can tell an improvement. She realized she does not increase her oxygen when she is up moving around   Stephanie Short has been improving slightly with walking and has now moved to the treadmill. She wants to get better so she can do more at home and hopes to get a lung transplant.      Expected Outcomes  -  Short: increase oxygen when she is active to help with shortness of breath. Long: continue monitoring blood sugar and blood pressure.   Short: Attend [facility] regularly to improve shortness of breath with ADL's. Long: maintain independence with ADL's         Core Components/Risk Factors/Patient Goals at Discharge (Final Review):  Goals and Risk Factor Review - 01/27/18 1446      Core Components/Risk Factors/Patient Goals Review   Personal Goals Review  Weight Management/Obesity;Improve shortness of breath with ADL's;Hypertension;Diabetes    Review  Stephanie Short has been improving slightly with walking and has now moved to the treadmill. She wants to get better so she can do more at home and hopes to get a lung transplant.     Expected Outcomes  Short: Attend [facility] regularly to improve shortness of breath with ADL's. Long: maintain independence with ADL's        ITP Comments: ITP Comments    Row Name 10/20/17 1108 10/23/17 1151 11/09/17 0901 11/18/17 1420 12/07/17 0845   ITP Comments  Medical Evaluation completed. Chart sent for review and changes to Dr. Emily Filbert Director of [place]. Diagnosis can be found in [facility] encounter  First full day of exercise!  Patient was oriented to gym and equipment including functions, settings,  policies, and procedures.  Patient's individual exercise prescription and treatment plan were reviewed.  All starting workloads were established based on the results of the 6 minute walk test done at initial orientation visit.  The plan for exercise progression was also introduced and progression will be customized based on patient's performance and goals  30 day review completed. ITP sent to Dr. Emily Filbert Director of [place]. Continue with ITP unless changes are made by physician.  Stephanie Short went to see Dr. [name] yesterday and was okayed to increase to 4-5L for oxygen when exerting, but she needs 6L to maintain saturations above 88%. We will send another note if this does not post in progress notes from office visit.  We also talked about using her e-cylinders for home exercise versus her portable concentrator as it does not give her enough flow.   30 day review completed. ITP sent to Dr. Emily Filbert Director of [place]. Continue with ITP unless changes are made by physician.   Row Name 01/04/18 [phone] 02/01/18 0850 02/17/18 1134       ITP Comments  30 day review completed. ITP sent to Dr. Emily Filbert Director of [place]. Continue with ITP unless changes are made by physician.  30 day review completed. ITP sent to Dr. Emily Filbert Director of [place]. Continue with ITP unless changes are made by physician.  Discharge ITP sent and signed by Dr. Sabra Heck.  Discharge Summary routed to PCP and pulmonologist        Comments: Discharge ITP

## 2018-02-17 NOTE — Progress Notes (Signed)
Discharge Progress Report  Patient Details  Name: Stephanie Short MRN: [id number] Date of Birth: 10-09-1950 Referring Provider:     Pulmonary Rehab from 10/20/2017 in [facility]  Referring Provider  [name]       Number of Visits: 1  Reason for Discharge:  Patient reached a stable level of exercise.  Smoking History:  Social History   Tobacco Use  Smoking Status Former Smoker  . Packs/day: 1.00  . Years: 20.00  . Pack years: 20.00  . Types: Cigarettes  . Last attempt to quit: 01/21/2004  . Years since quitting: 14.0  Smokeless Tobacco Never Used    Diagnosis:  Chronic obstructive pulmonary disease, unspecified COPD type ([place])  ADL UCSD: Pulmonary Assessment Scores    Row Name 10/20/17 1135 02/03/18 1150       ADL UCSD   ADL Phase  Entry  Exit    SOB Score total  67  67    Rest  0  1    Walk  4  4    Stairs  5  4    Bath  4  4    Dress  5  4    Shop  3  3      CAT Score   CAT Score  19  28      mMRC Score   mMRC Score  3  4       Initial Exercise Prescription: Initial Exercise Prescription - 10/20/17 1200      Date of Initial Exercise RX and Referring Provider   Date  10/20/17    Referring Provider  [name]      Oxygen   Oxygen  Continuous    Liters  3      NuStep   Level  1    SPM  80    Minutes  15    METs  1.2      Biostep-RELP   Level  1    SPM  50    Minutes  15    METs  1      Track   Laps  10    Minutes  15    METs  1.3      Prescription Details   Frequency (times per week)  3    Duration  Progress to 45 minutes of aerobic exercise without signs/symptoms of physical distress      Intensity   THRR 40-80% of Max Heartrate  116-140    Ratings of Perceived Exertion  11-13    Perceived Dyspnea  0-4      Resistance Training   Training Prescription  Yes    Weight  2 lb    Reps  10-15       Discharge Exercise Prescription (Final Exercise Prescription Changes): Exercise Prescription Changes -  02/01/18 1600      Response to Exercise   Blood Pressure (Admit)  134/68    Blood Pressure (Exit)  126/62    Heart Rate (Admit)  99 bpm    Heart Rate (Exercise)  118 bpm    Heart Rate (Exit)  99 bpm    Oxygen Saturation (Admit)  90 %    Oxygen Saturation (Exercise)  85 %    Oxygen Saturation (Exit)  92 %    Rating of Perceived Exertion (Exercise)  13    Perceived Dyspnea (Exercise)  2    Symptoms  desats    Duration  Progress to 45 minutes of  aerobic exercise without signs/symptoms of physical distress   rests walking   Intensity  THRR unchanged      Progression   Progression  Continue to progress workloads to maintain intensity without signs/symptoms of physical distress.    Average METs  1.53      Resistance Training   Training Prescription  Yes    Weight  3 lbs    Reps  10-15      Interval Training   Interval Training  No      Oxygen   Oxygen  Continuous    Liters  6      Treadmill   MPH  0.5    Grade  0    Minutes  15    METs  1.38      NuStep   Level  3    Minutes  15    METs  2.2      Biostep-RELP   Level  2    Minutes  15    METs  1      Home Exercise Plan   Plans to continue exercise at  Home (comment)   walking, gym up the street   Frequency  Add 1 additional day to program exercise sessions.    Initial Home Exercises Provided  11/25/17       Functional Capacity: 6 Minute Walk    Row Name 10/20/17 1239 02/03/18 1159       6 Minute Walk   Phase  Initial  Discharge    Distance  300 feet  220 feet    Distance % Change  -  -26.7 %    Distance Feet Change  -  -80 ft    Walk Time  2.75 minutes  1.93 minutes    # of Rest Breaks  0  0    MPH  1.23  1.29    METS  1.2  1.17    RPE  11  17    Perceived Dyspnea   3  3    VO2 Peak  4.2  4.11    Symptoms  No  Yes (comment)    Comments  -  SOB    Resting HR  92 bpm  94 bpm    Resting BP  114/64  148/74    Resting Oxygen Saturation   91 %  90 %    Exercise Oxygen Saturation  during 6 min walk   78 %  78 %    Max Ex. HR  120 bpm  122 bpm    Max Ex. BP  142/64  158/78    2 Minute Post BP  114/60  142/76      Interval HR   1 Minute HR  110  105    2 Minute HR  117  122    3 Minute HR  120  -    2 Minute Post HR  -  100    Interval Heart Rate?  Yes  Yes      Interval Oxygen   Interval Oxygen?  Yes  Yes    Baseline Oxygen Saturation %  91 %  90 %    1 Minute Oxygen Saturation %  84 %  84 %    1 Minute Liters of Oxygen  3 L pulsed  6 L    2 Minute Oxygen Saturation %  80 %  78 %    2 Minute Liters of Oxygen  3 L pulsed  6 L  3 Minute Oxygen Saturation %  78 %  -    3 Minute Liters of Oxygen  3 L pulsed  -    2 Minute Post Oxygen Saturation %  -  92 %    2 Minute Post Liters of Oxygen  -  6 L       Psychological, QOL, Others - Outcomes: PHQ 2/9: Depression screen [facility] 2/9 02/03/2018 01/18/2018 12/21/2017 12/02/2017 10/20/2017  Decreased Interest _0 0  Down, Depressed, Hopeless _1 PHQ - 2 Score _2 Altered sleeping 0 0 _3 Tired, decreased energy _4 Change in appetite _5 Feeling bad or failure about yourself  1 2 0 1 3  Trouble concentrating 0 0 0 0 0  Moving slowly or fidgety/restless 1 1 0 1 3  Suicidal thoughts 0 0 0 0 0  PHQ-9 Score _6 Difficult doing work/chores Somewhat difficult Somewhat difficult Somewhat difficult Very difficult Extremely dIfficult  Some recent data might be hidden    Quality of Life:   Personal Goals: Goals established at orientation with interventions provided to work toward goal. Personal Goals and Risk Factors at Admission - 10/20/17 1144      Core Components/Risk Factors/Patient Goals on Admission    Weight Management  Yes;Weight Loss    Intervention  Weight Management: Develop a combined nutrition and exercise program designed to reach desired caloric intake, while maintaining appropriate intake of nutrient and fiber, sodium and fats, and appropriate energy expenditure required for the  weight goal.;Weight Management: Provide education and appropriate resources to help participant work on and attain dietary goals.;Weight Management/Obesity: Establish reasonable short term and long term weight goals.    Admit Weight  198 lb 11.2 oz (90.1 kg)    Goal Weight: Short Term  193 lb (87.5 kg)    Goal Weight: Long Term  150 lb (68 kg)    Expected Outcomes  Short Term: Continue to assess and modify interventions until short term weight is achieved;Long Term: Adherence to nutrition and physical activity/exercise program aimed toward attainment of established weight goal;Weight Loss: Understanding of general recommendations for a balanced deficit meal plan, which promotes 1-2 lb weight loss per week and includes a negative energy balance of [phone] kcal/d;Understanding recommendations for meals to include 15-35% energy as protein, 25-35% energy from fat, 35-60% energy from carbohydrates, less than 280m of dietary cholesterol, 20-35 gm of total fiber daily;Understanding of distribution of calorie intake throughout the day with the consumption of 4-5 meals/snacks    Improve shortness of breath with ADL's  Yes    Intervention  Provide education, individualized exercise plan and daily activity instruction to help decrease symptoms of SOB with activities of daily living.    Expected Outcomes  Short Term: Improve cardiorespiratory fitness to achieve a reduction of symptoms when performing ADLs;Long Term: Be able to perform more ADLs without symptoms or delay the onset of symptoms    Diabetes  Yes    Intervention  Provide education about proper nutrition, including hydration, and aerobic/resistive exercise prescription along with prescribed medications to achieve blood glucose in normal ranges: Fasting glucose 65-99 mg/dL;Provide education about signs/symptoms and action to take for hypo/hyperglycemia.    Expected Outcomes  Long Term: Attainment of HbA1C < 7%.;Short Term: Participant verbalizes  understanding of the signs/symptoms and immediate care of hyper/hypoglycemia, proper foot care and  importance of medication, aerobic/resistive exercise and nutrition plan for blood glucose control.    Hypertension  Yes    Intervention  Provide education on lifestyle modifcations including regular physical activity/exercise, weight management, moderate sodium restriction and increased consumption of fresh fruit, vegetables, and low fat dairy, alcohol moderation, and smoking cessation.;Monitor prescription use compliance.    Expected Outcomes  Short Term: Continued assessment and intervention until BP is < 140/82m HG in hypertensive participants. < 130/819mHG in hypertensive participants with diabetes, heart failure or chronic kidney disease.;Long Term: Maintenance of blood pressure at goal levels.        Personal Goals Discharge: Goals and Risk Factor Review    Row Name 12/02/17 1221 01/01/18 1148 01/27/18 1446         Core Components/Risk Factors/Patient Goals Review   Personal Goals Review  Weight Management/Obesity;Improve shortness of breath with ADL's;Hypertension;Diabetes  Weight Management/Obesity;Improve shortness of breath with ADL's;Hypertension;Diabetes  Weight Management/Obesity;Improve shortness of breath with ADL's;Hypertension;Diabetes     Review  Patient has been checking her blood sugar at home and has not been over 160. She has been checking her blood pressure at home and states it has been good. Her ADL's have not been getting any easier for her. She may be eligable for a lung program to have other treatment options.  She is going in for a heart cath next Wednesday [place] when she is back.  Her cath was clean, showed mild to moderate pulmonary hypertension. Liz's weight has not changed. She is still monitoring her blood sugar which has been running 140s-160s. She is still experiencing tiredness, but can tell an improvement. She  realized she does not increase her oxygen when she is up moving around   LiKathlee Nationsas been improving slightly with walking and has now moved to the treadmill. She wants to get better so she can do more at home and hopes to get a lung transplant.      Expected Outcomes  -  Short: increase oxygen when she is active to help with shortness of breath. Long: continue monitoring blood sugar and blood pressure.   Short: Attend [facility] regularly to improve shortness of breath with ADL's. Long: maintain independence with ADL's         Exercise Goals and Review: Exercise Goals    Row Name 10/20/17 1238             Exercise Goals   Increase Physical Activity  Yes       Intervention  Provide advice, education, support and counseling about physical activity/exercise needs.;Develop an individualized exercise prescription for aerobic and resistive training based on initial evaluation findings, risk stratification, comorbidities and participant's personal goals.       Expected Outcomes  Short Term: Attend rehab on a regular basis to increase amount of physical activity.;Long Term: Add in home exercise to make exercise part of routine and to increase amount of physical activity.;Long Term: Exercising regularly at least 3-5 days a week.       Increase Strength and Stamina  Yes       Intervention  Provide advice, education, support and counseling about physical activity/exercise needs.;Develop an individualized exercise prescription for aerobic and resistive training based on initial evaluation findings, risk stratification, comorbidities and participant's personal goals.       Expected Outcomes  Short Term: Increase workloads from initial exercise prescription for resistance, speed, and METs.;Short Term: Perform resistance training exercises  routinely during rehab and add in resistance training at home;Long Term: Improve cardiorespiratory fitness, muscular endurance and strength as measured by increased METs and  functional capacity (6MWT)       Able to understand and use rate of perceived exertion (RPE) scale  Yes       Intervention  Provide education and explanation on how to use RPE scale       Expected Outcomes  Short Term: Able to use RPE daily in rehab to express subjective intensity level;Long Term:  Able to use RPE to guide intensity level when exercising independently       Able to understand and use Dyspnea scale  Yes       Intervention  Provide education and explanation on how to use Dyspnea scale       Expected Outcomes  Short Term: Able to use Dyspnea scale daily in rehab to express subjective sense of shortness of breath during exertion;Long Term: Able to use Dyspnea scale to guide intensity level when exercising independently       Knowledge and understanding of Target Heart Rate Range (THRR)  Yes       Intervention  Provide education and explanation of THRR including how the numbers were predicted and where they are located for reference       Expected Outcomes  Short Term: Able to state/look up THRR;Short Term: Able to use daily as guideline for intensity in rehab;Long Term: Able to use THRR to govern intensity when exercising independently       Able to check pulse independently  Yes       Intervention  Provide education and demonstration on how to check pulse in carotid and radial arteries.;Review the importance of being able to check your own pulse for safety during independent exercise       Expected Outcomes  Short Term: Able to explain why pulse checking is important during independent exercise;Long Term: Able to check pulse independently and accurately       Understanding of Exercise Prescription  Yes       Intervention  Provide education, explanation, and written materials on patient's individual exercise prescription       Expected Outcomes  Short Term: Able to explain program exercise prescription;Long Term: Able to explain home exercise prescription to exercise independently           Exercise Goals Re-Evaluation: Exercise Goals Re-Evaluation    Row Name 10/23/17 1150 10/26/17 1640 11/11/17 1404 11/24/17 1549 11/25/17 1402     Exercise Goal Re-Evaluation   Exercise Goals Review  Increase Physical Activity;Increase Strength and Stamina;Able to understand and use Dyspnea scale;Able to understand and use rate of perceived exertion (RPE) scale;Understanding of Exercise Prescription;Knowledge and understanding of Target Heart Rate Range (THRR)  Increase Physical Activity;Increase Strength and Stamina;Understanding of Exercise Prescription  Increase Physical Activity;Increase Strength and Stamina;Understanding of Exercise Prescription  Increase Physical Activity;Increase Strength and Stamina;Understanding of Exercise Prescription  Increase Physical Activity;Increase Strength and Stamina;Understanding of Exercise Prescription;Able to understand and use rate of perceived exertion (RPE) scale;Able to understand and use Dyspnea scale;Knowledge and understanding of Target Heart Rate Range (THRR);Able to check pulse independently   Comments  Reviewed RPE scale, THR and program prescription with pt today.  Pt voiced understanding and was given a copy of goals to take home.   Kathlee Nations has completed two full days of exercise.  She continues to struggle with maintaining her saturations, especially while walking.  Note was sent to Dr. [name] to increase  her oxygen flow rate during exercise.  We will continue to increase her time of exercise and monitor her progress.   Kathlee Nations is doing well in rehab. She is now up to 14 laps while walking.  She still has not gotten permission to increase her oxygen flow for home use. We will send the request over again.   Once cleared for a high oxygen flow, we will review her home exercise guidelines.  We will begin to increase her other pieces of equipment and continue to monitor her progress.   Kathlee Nations has continued to do well in rehab.  She now has a high flow cannula for her  6L for exercise.  She is still working on getting a higher flow portable device but she does have e-cylinders she could use. We were going to go over her home exercise guidelines on her next visit but she has missed the last two sessions.  She is up to 18 laps and level 2 on the BioStep.  We will continue to monitor her progress.   Reviewed home exercise with pt today.  Pt plans to walk and go to gym up the street for exercise.  Reviewed THR, pulse, RPE, sign and symptoms, and when to call 911 or MD.  Also discussed weather considerations and indoor options.  Pt voiced understanding.   Expected Outcomes  Short: Use RPE daily to regulate intensity. Long: Follow program prescription in THR.  Short: Continue to attend reguarly.  Long: Continue to follow program prescription.   Short: Attend regularly and get clearance for higher oxygen flow rate.  Long: Continue to increase activity levels.   Short: Review home exercise guidelines.  Long: Continue to increase activity.  Short: Start to add in at least one extra day a week at home.  Long: Continue to move more!!   [place] Name 12/08/17 1441 12/22/17 1438 01/01/18 1148 01/05/18 1305 01/21/18 1651     Exercise Goal Re-Evaluation   Exercise Goals Review  Increase Physical Activity;Increase Strength and Stamina;Understanding of Exercise Prescription  Increase Physical Activity;Increase Strength and Stamina;Understanding of Exercise Prescription  -  Increase Physical Activity;Increase Strength and Stamina;Understanding of Exercise Prescription  Increase Physical Activity;Increase Strength and Stamina;Able to understand and use rate of perceived exertion (RPE) scale;Able to understand and use Dyspnea scale;Knowledge and understanding of Target Heart Rate Range (THRR);Understanding of Exercise Prescription   Comments  Kathlee Nations has been doing well in rehab. She continues to try to walk more and is more diligent about watching to make sure her saturations do not drop. She is up to  level 3 on the NuStep.  We will continue to monitor her progress.   Kathlee Nations continues to do well in rehab.  She continues to struggle with saturations dropping during exercise which makes it hard to progress.  Her doctor has limited her to just 6L.  We will continue to monitor her progress.  Kathlee Nations has not started her home exercise yet (walking at home). She wants to start after the holidays since it is a busy time of year for her.   Kathlee Nations is doing well in rehab.  She is getting in her 9 laps on the track.  Her saturations continue to drop with exercise which inhibits her progression.   We will continue to monitor her progress.   Kathlee Nations has progressed to 11 laps walking.  Her sats continue to drop at times.  Staff will monitor progress.   Expected Outcomes  Short: Continue to increase walking and move  up on BioStep  Long: Continue to increase activity levels.   Short: Continue to walk more.  Long: Continue to increase strength and stamina.   Short: add one extra day of exercise. Long: become independent with exercise  Short: Continue to monitor saturations to try to increase workloads.  Long: Continue to increase strength and stamina.   Short - continue to attend and work on sat levels Long - maintain 02 at normal range during exercise   Row Name 02/01/18 1605             Exercise Goal Re-Evaluation   Exercise Goals Review  Increase Physical Activity;Increase Strength and Stamina;Understanding of Exercise Prescription       Comments  Kathlee Nations has been doing well in rehab.  She continues to struggle with desaturations.  As a result, we are not able to make too much progress. We will continue to monitor.        Expected Outcomes  Short: Increase seated equipment on good day.  Long: Continue to improve strength and stamina.           Nutrition & Weight - Outcomes: Pre Biometrics - 10/20/17 1238      Pre Biometrics   Height  _0  (1.626 m)    Weight  198 lb 11.2 oz (90.1 kg)    Waist Circumference  40 inches     Hip Circumference  45 inches    Waist to Hip Ratio  0.89 %    BMI (Calculated)  34.09    Single Leg Stand  22.07 seconds      Post Biometrics - 02/03/18 1201       Post  Biometrics   Height  _1  (1.626 m)    Weight  199 lb (90.3 kg)    Waist Circumference  38 inches    Hip Circumference  44 inches    Waist to Hip Ratio  0.86 %    BMI (Calculated)  34.14    Single Leg Stand  5.52 seconds       Nutrition: Nutrition Therapy & Goals - 11/04/17 1517      Nutrition Therapy   Diet  DM    Protein (specify units)  10oz    Fiber  25 grams    Whole Grain Foods  3 servings   eats brown rice   Saturated Fats  12 max. grams    Fruits and Vegetables  5 servings/day   8 ideal; has been eating less vegetables since she was put on oxygen therapy d/t eating out more   Sodium  1500 grams      Personal Nutrition Goals   Nutrition Goal  Make adjustments to your evening snack regimen. You can either work to reduce portions of your current snacks (try single serve bags for example), or swap your current snack options for more nutrient dense options such as nuts, fruit, or peanut butter + low salt pretzels/ crackers    Personal Goal #2  Start to pay more attention to the amount of sodium you are consuming, especially because you have been eating out more frequently    Personal Goal #3  Work with your husband to start planning meals ahead of time using the ideas in the packet provided    Comments  She and her husband are both diabetics and are working to have a healthier eating plan. They have been eating out more often d/t pt not having the energy to cook at home. She feels she  struggles with meal planning knowing how to pair foods together at meal times to make a complete/ nutritionally balanced meal. She does not drink sugar sweetened beverages and typically eats eggs for breakfast. She does not usually eat lunch and instead may snack on some fruit mid-day. She does not eat beef. Feels that night  time snacking is one of her worst habits      Intervention Plan   Intervention  Prescribe, educate and counsel regarding individualized specific dietary modifications aiming towards targeted core components such as weight, hypertension, lipid management, diabetes, heart failure and other comorbidities.;Nutrition handout(s) given to patient.   Quick and healthy meal ideas packet; Mediterranean diet informational packet   Expected Outcomes  Short Term Goal: A plan has been developed with personal nutrition goals set during dietitian appointment.;Long Term Goal: Adherence to prescribed nutrition plan.;Short Term Goal: Understand basic principles of dietary content, such as calories, fat, sodium, cholesterol and nutrients.       Nutrition Discharge: Nutrition Assessments - 10/20/17 1138      MEDFICTS Scores   Pre Score  69       Education Questionnaire Score: Knowledge Questionnaire Score - 02/03/18 1151      Knowledge Questionnaire Score   Pre Score  14/18    Post Score  16/18   reviewed with patient      Goals reviewed with patient; copy given to patient.

## 2018-02-17 NOTE — Progress Notes (Signed)
Daily Session Note  Patient Details  Name: Stephanie Short MRN: [id number] Date of Birth: 04-13-1950 Referring Provider:     Pulmonary Rehab from 10/20/2017 in [facility] Cardiac and Pulmonary Rehab  Referring Provider  [name]      Encounter Date: 02/17/2018  Check In: Session Check In - 02/17/18 1132      Check-In   Supervising physician immediately available to respond to emergencies  [facility] immediately available ER MD    Physician(s)  Drs. Domenic Polite    Location  [facility]-Cardiac & Pulmonary Rehab    Staff Present  Renita Papa, RN BSN;Shoji Pertuit Luan Pulling, MA, RCEP, CCRP, Exercise Physiologist;Amanda Oletta Darter, [place], ACSM CEP, Exercise Physiologist    Medication changes reported      No    Fall or balance concerns reported     No    Warm-up and Cool-down  Performed as group-led instruction    Resistance Training Performed  Yes    VAD Patient?  No    PAD/SET Patient?  No      Pain Assessment   Currently in Pain?  No/denies          Social History   Tobacco Use  Smoking Status Former Smoker  . Packs/day: 1.00  . Years: 20.00  . Pack years: 20.00  . Types: Cigarettes  . Last attempt to quit: 01/21/2004  . Years since quitting: 14.0  Smokeless Tobacco Never Used    Goals Met:  Proper associated with RPD/PD & O2 Sat Independence with exercise equipment Using PLB without cueing & demonstrates good technique Exercise tolerated well Personal goals reviewed No report of cardiac concerns or symptoms Strength training completed today  Goals Unmet:  Not Applicable  Comments:  [name] graduated today from  rehab with 36 sessions completed.  Details of the patient's exercise prescription and what She needs to do in order to continue the prescription and progress were discussed with patient.  Patient was given a copy of prescription and goals.  Patient verbalized understanding.  [name] plans to continue to exercise by joining [employer].   Dr. Emily Filbert is  Medical Director for [place] and [facility] Pulmonary Rehabilitation.

## 2018-02-24 ENCOUNTER — Other Ambulatory Visit: Payer: Self-pay | Admitting: Nurse Practitioner

## 2018-02-24 ENCOUNTER — Telehealth: Payer: Self-pay | Admitting: Nurse Practitioner

## 2018-02-24 MED ORDER — VITAMIN B-12 1000 MCG PO TABS: 1000.0000 ug | ORAL_TABLET | Freq: Every day | ORAL | 1 refills | Status: AC

## 2018-02-24 NOTE — Telephone Encounter (Signed)
Patient called stating she needs script for Vitamin B sent to pharmacy. Pharmacy told her they do not have any refills.

## 2018-02-24 NOTE — Telephone Encounter (Signed)
Called patient and inform her script has been e-scribed.

## 2018-02-24 NOTE — Telephone Encounter (Signed)
sent

## 2018-03-15 ENCOUNTER — Inpatient Hospital Stay: Payer: 59

## 2018-03-15 ENCOUNTER — Other Ambulatory Visit: Payer: Self-pay

## 2018-03-15 ENCOUNTER — Inpatient Hospital Stay
Admission: EM | Admit: 2018-03-15 | Discharge: 2018-03-21 | DRG: 208 | Disposition: E | Payer: 59 | Attending: Internal Medicine | Admitting: Internal Medicine

## 2018-03-15 ENCOUNTER — Inpatient Hospital Stay: Admit: 2018-03-15 | Payer: 59

## 2018-03-15 ENCOUNTER — Emergency Department: Payer: 59

## 2018-03-15 DIAGNOSIS — I2721 Secondary pulmonary arterial hypertension: Secondary | ICD-10-CM | POA: Diagnosis not present

## 2018-03-15 DIAGNOSIS — G894 Chronic pain syndrome: Secondary | ICD-10-CM | POA: Diagnosis present

## 2018-03-15 DIAGNOSIS — J449 Chronic obstructive pulmonary disease, unspecified: Secondary | ICD-10-CM | POA: Diagnosis not present

## 2018-03-15 DIAGNOSIS — I468 Cardiac arrest due to other underlying condition: Secondary | ICD-10-CM | POA: Diagnosis not present

## 2018-03-15 DIAGNOSIS — R0602 Shortness of breath: Secondary | ICD-10-CM | POA: Diagnosis present

## 2018-03-15 DIAGNOSIS — Z7984 Long term (current) use of oral hypoglycemic drugs: Secondary | ICD-10-CM | POA: Diagnosis not present

## 2018-03-15 DIAGNOSIS — Z885 Allergy status to narcotic agent status: Secondary | ICD-10-CM

## 2018-03-15 DIAGNOSIS — E1169 Type 2 diabetes mellitus with other specified complication: Secondary | ICD-10-CM | POA: Diagnosis present

## 2018-03-15 DIAGNOSIS — E1165 Type 2 diabetes mellitus with hyperglycemia: Secondary | ICD-10-CM | POA: Diagnosis present

## 2018-03-15 DIAGNOSIS — Z79899 Other long term (current) drug therapy: Secondary | ICD-10-CM

## 2018-03-15 DIAGNOSIS — G4733 Obstructive sleep apnea (adult) (pediatric): Secondary | ICD-10-CM | POA: Diagnosis present

## 2018-03-15 DIAGNOSIS — R680 Hypothermia, not associated with low environmental temperature: Secondary | ICD-10-CM | POA: Diagnosis present

## 2018-03-15 DIAGNOSIS — J9601 Acute respiratory failure with hypoxia: Secondary | ICD-10-CM | POA: Diagnosis present

## 2018-03-15 DIAGNOSIS — Z888 Allergy status to other drugs, medicaments and biological substances status: Secondary | ICD-10-CM

## 2018-03-15 DIAGNOSIS — R739 Hyperglycemia, unspecified: Secondary | ICD-10-CM

## 2018-03-15 DIAGNOSIS — Z91013 Allergy to seafood: Secondary | ICD-10-CM

## 2018-03-15 DIAGNOSIS — M810 Age-related osteoporosis without current pathological fracture: Secondary | ICD-10-CM | POA: Diagnosis present

## 2018-03-15 DIAGNOSIS — I255 Ischemic cardiomyopathy: Secondary | ICD-10-CM | POA: Diagnosis present

## 2018-03-15 DIAGNOSIS — J9622 Acute and chronic respiratory failure with hypercapnia: Secondary | ICD-10-CM | POA: Diagnosis present

## 2018-03-15 DIAGNOSIS — J9621 Acute and chronic respiratory failure with hypoxia: Secondary | ICD-10-CM | POA: Diagnosis not present

## 2018-03-15 DIAGNOSIS — Z79891 Long term (current) use of opiate analgesic: Secondary | ICD-10-CM

## 2018-03-15 DIAGNOSIS — I2510 Atherosclerotic heart disease of native coronary artery without angina pectoris: Secondary | ICD-10-CM | POA: Diagnosis not present

## 2018-03-15 DIAGNOSIS — Z9981 Dependence on supplemental oxygen: Secondary | ICD-10-CM

## 2018-03-15 DIAGNOSIS — R092 Respiratory arrest: Secondary | ICD-10-CM

## 2018-03-15 DIAGNOSIS — I214 Non-ST elevation (NSTEMI) myocardial infarction: Secondary | ICD-10-CM | POA: Diagnosis present

## 2018-03-15 DIAGNOSIS — Z7982 Long term (current) use of aspirin: Secondary | ICD-10-CM

## 2018-03-15 DIAGNOSIS — E1122 Type 2 diabetes mellitus with diabetic chronic kidney disease: Secondary | ICD-10-CM | POA: Diagnosis present

## 2018-03-15 DIAGNOSIS — Z833 Family history of diabetes mellitus: Secondary | ICD-10-CM

## 2018-03-15 DIAGNOSIS — N189 Chronic kidney disease, unspecified: Secondary | ICD-10-CM | POA: Diagnosis present

## 2018-03-15 DIAGNOSIS — E782 Mixed hyperlipidemia: Secondary | ICD-10-CM | POA: Diagnosis present

## 2018-03-15 DIAGNOSIS — I5032 Chronic diastolic (congestive) heart failure: Secondary | ICD-10-CM | POA: Diagnosis present

## 2018-03-15 DIAGNOSIS — Z87891 Personal history of nicotine dependence: Secondary | ICD-10-CM

## 2018-03-15 DIAGNOSIS — Z800 Family history of malignant neoplasm of digestive organs: Secondary | ICD-10-CM

## 2018-03-15 DIAGNOSIS — Z452 Encounter for adjustment and management of vascular access device: Secondary | ICD-10-CM

## 2018-03-15 DIAGNOSIS — I130 Hypertensive heart and chronic kidney disease with heart failure and stage 1 through stage 4 chronic kidney disease, or unspecified chronic kidney disease: Secondary | ICD-10-CM | POA: Diagnosis present

## 2018-03-15 DIAGNOSIS — T68XXXA Hypothermia, initial encounter: Secondary | ICD-10-CM

## 2018-03-15 DIAGNOSIS — Z66 Do not resuscitate: Secondary | ICD-10-CM | POA: Diagnosis not present

## 2018-03-15 DIAGNOSIS — Z8249 Family history of ischemic heart disease and other diseases of the circulatory system: Secondary | ICD-10-CM

## 2018-03-15 LAB — COMPREHENSIVE METABOLIC PANEL
ALK PHOS: 98 U/L (ref 38–126)
ALT: 15 U/L (ref 0–44)
AST: 25 U/L (ref 15–41)
Albumin: 3.9 g/dL (ref 3.5–5.0)
Anion gap: 10 (ref 5–15)
BILIRUBIN TOTAL: 0.5 mg/dL (ref 0.3–1.2)
BUN: 17 mg/dL (ref 8–23)
CO2: 25 mmol/L (ref 22–32)
Calcium: 8.6 mg/dL — ABNORMAL LOW (ref 8.9–10.3)
Chloride: 105 mmol/L (ref 98–111)
Creatinine, Ser: 1.13 mg/dL — ABNORMAL HIGH (ref 0.44–1.00)
GFR calc Af Amer: 58 mL/min — ABNORMAL LOW (ref >60)
GFR calc non Af Amer: 50 mL/min — ABNORMAL LOW (ref >60)
Glucose, Bld: 362 mg/dL — ABNORMAL HIGH (ref 70–99)
Potassium: 3.6 mmol/L (ref 3.5–5.1)
Sodium: 140 mmol/L (ref 135–145)
Total Protein: 7.7 g/dL (ref 6.5–8.1)

## 2018-03-15 LAB — CBC WITH DIFFERENTIAL/PLATELET
Abs Immature Granulocytes: 0.39 10*3/uL — ABNORMAL HIGH (ref 0.00–0.07)
Basophils Absolute: 0.1 10*3/uL (ref 0.0–0.1)
Basophils Relative: 1 %
EOS PCT: 1 %
Eosinophils Absolute: 0.1 10*3/uL (ref 0.0–0.5)
HCT: 49.9 % — ABNORMAL HIGH (ref 36.0–46.0)
HEMOGLOBIN: 15.3 g/dL — AB (ref 12.0–15.0)
Immature Granulocytes: 2 %
Lymphocytes Relative: 24 %
Lymphs Abs: 4 10*3/uL (ref 0.7–4.0)
MCH: 26.8 pg (ref 26.0–34.0)
MCHC: 30.7 g/dL (ref 30.0–36.0)
MCV: 87.5 fL (ref 80.0–100.0)
MONO ABS: 0.8 10*3/uL (ref 0.1–1.0)
Monocytes Relative: 5 %
Neutro Abs: 11.5 10*3/uL — ABNORMAL HIGH (ref 1.7–7.7)
Neutrophils Relative %: 67 %
Platelets: 230 10*3/uL (ref 150–400)
RBC: 5.7 MIL/uL — ABNORMAL HIGH (ref 3.87–5.11)
RDW: 15.8 % — ABNORMAL HIGH (ref 11.5–15.5)
WBC: 16.9 10*3/uL — ABNORMAL HIGH (ref 4.0–10.5)
nRBC: 0.2 % (ref 0.0–0.2)

## 2018-03-15 LAB — URINE DRUG SCREEN, QUALITATIVE (ARMC ONLY)
Amphetamines, Ur Screen: NOT DETECTED
Barbiturates, Ur Screen: NOT DETECTED
Benzodiazepine, Ur Scrn: NOT DETECTED
CANNABINOID 50 NG, UR ~~LOC~~: NOT DETECTED
Cocaine Metabolite,Ur ~~LOC~~: NOT DETECTED
MDMA (Ecstasy)Ur Screen: NOT DETECTED
Methadone Scn, Ur: NOT DETECTED
Opiate, Ur Screen: NOT DETECTED
Phencyclidine (PCP) Ur S: NOT DETECTED
Tricyclic, Ur Screen: NOT DETECTED

## 2018-03-15 LAB — BLOOD GAS, ARTERIAL
ACID-BASE DEFICIT: 1.3 mmol/L (ref 0.0–2.0)
Acid-base deficit: 8 mmol/L — ABNORMAL HIGH (ref 0.0–2.0)
Bicarbonate: 21.4 mmol/L (ref 20.0–28.0)
Bicarbonate: 24.8 mmol/L (ref 20.0–28.0)
FIO2: 1
FIO2: 1
MECHVT: 450 mL
Mechanical Rate: 20
O2 Saturation: 98.8 %
O2 Saturation: 84 %
PEEP/CPAP: 10 cmH2O
PEEP: 5 cmH2O
Patient temperature: 37
Patient temperature: 37
RATE: 20 resp/min
VT: 500 mL
pCO2 arterial: 60 mmHg — ABNORMAL HIGH (ref 32.0–48.0)
pCO2 arterial: 46 mmHg (ref 32.0–48.0)
pH, Arterial: 7.16 — CL (ref 7.350–7.450)
pH, Arterial: 7.34 — ABNORMAL LOW (ref 7.350–7.450)
pO2, Arterial: 153 mmHg — ABNORMAL HIGH (ref 83.0–108.0)
pO2, Arterial: 52 mmHg — ABNORMAL LOW (ref 83.0–108.0)

## 2018-03-15 LAB — TROPONIN I
Troponin I: <0.03 ng/mL (ref <0.03)
Troponin I: 3.37 ng/mL (ref <0.03)

## 2018-03-15 LAB — BASIC METABOLIC PANEL
Anion gap: 13 (ref 5–15)
BUN: 21 mg/dL (ref 8–23)
CO2: 23 mmol/L (ref 22–32)
Calcium: 9.1 mg/dL (ref 8.9–10.3)
Chloride: 106 mmol/L (ref 98–111)
Creatinine, Ser: 1.31 mg/dL — ABNORMAL HIGH (ref 0.44–1.00)
GFR calc Af Amer: 48 mL/min — ABNORMAL LOW (ref >60)
GFR calc non Af Amer: 42 mL/min — ABNORMAL LOW (ref >60)
Glucose, Bld: 402 mg/dL — ABNORMAL HIGH (ref 70–99)
POTASSIUM: 4.1 mmol/L (ref 3.5–5.1)
Sodium: 142 mmol/L (ref 135–145)

## 2018-03-15 LAB — URINALYSIS, COMPLETE (UACMP) WITH MICROSCOPIC
Bilirubin Urine: NEGATIVE
Glucose, UA: >=500 mg/dL — AB
Hgb urine dipstick: NEGATIVE
KETONES UR: NEGATIVE mg/dL
Leukocytes,Ua: NEGATIVE
Nitrite: NEGATIVE
Protein, ur: >=300 mg/dL — AB
Specific Gravity, Urine: 1.025 (ref 1.005–1.030)
pH: 6 (ref 5.0–8.0)

## 2018-03-15 LAB — GLUCOSE, CAPILLARY
GLUCOSE-CAPILLARY: 333 mg/dL — AB (ref 70–99)
Glucose-Capillary: 324 mg/dL — ABNORMAL HIGH (ref 70–99)
Glucose-Capillary: 361 mg/dL — ABNORMAL HIGH (ref 70–99)
Glucose-Capillary: 346 mg/dL — ABNORMAL HIGH (ref 70–99)

## 2018-03-15 LAB — PROTIME-INR
INR: 1.28
PROTHROMBIN TIME: 15.9 s — AB (ref 11.4–15.2)

## 2018-03-15 LAB — TSH: TSH: 4.102 u[IU]/mL (ref 0.350–4.500)

## 2018-03-15 LAB — LACTIC ACID, PLASMA
Lactic Acid, Venous: 8 mmol/L (ref 0.5–1.9)
Lactic Acid, Venous: 3.7 mmol/L (ref 0.5–1.9)

## 2018-03-15 LAB — PROCALCITONIN: Procalcitonin: 1.67 ng/mL

## 2018-03-15 LAB — APTT: APTT: 45 s — AB (ref 24–36)

## 2018-03-15 LAB — MRSA PCR SCREENING: MRSA by PCR: NEGATIVE

## 2018-03-15 MED ORDER — IPRATROPIUM-ALBUTEROL 0.5-2.5 (3) MG/3ML IN SOLN: 3.0000 mL | RESPIRATORY_TRACT | Status: DC | PRN

## 2018-03-15 MED ORDER — SODIUM CHLORIDE 0.9 % IV BOLUS
1000.0000 mL | Freq: Once | INTRAVENOUS | Status: AC
  Administered 2018-03-15: 1000 mL via INTRAVENOUS

## 2018-03-15 MED ORDER — STERILE WATER FOR INJECTION IV SOLN
INTRAVENOUS | Status: DC
  Administered 2018-03-15: 10:00:00 via INTRAVENOUS
  Filled 2018-03-15 (×3): qty 850

## 2018-03-15 MED ORDER — PROPOFOL 1000 MG/100ML IV EMUL
25.0000 ug/kg/min | INTRAVENOUS | Status: DC
  Filled 2018-03-15: qty 100

## 2018-03-15 MED ORDER — INSULIN ASPART 100 UNIT/ML ~~LOC~~ SOLN
0.0000 [IU] | SUBCUTANEOUS | Status: DC
  Administered 2018-03-15: 15 [IU] via SUBCUTANEOUS
  Filled 2018-03-15: qty 1

## 2018-03-15 MED ORDER — ORAL CARE MOUTH RINSE
15.0000 mL | OROMUCOSAL | Status: DC
  Administered 2018-03-15: 15 mL via OROMUCOSAL

## 2018-03-15 MED ORDER — FENTANYL CITRATE (PF) 100 MCG/2ML IJ SOLN: 100.0000 ug | Freq: Once | INTRAMUSCULAR | Status: DC

## 2018-03-15 MED ORDER — HEPARIN SODIUM (PORCINE) 5000 UNIT/ML IJ SOLN
5000.0000 [IU] | Freq: Three times a day (TID) | INTRAMUSCULAR | Status: DC
  Administered 2018-03-15: 5000 [IU] via SUBCUTANEOUS
  Filled 2018-03-15: qty 1

## 2018-03-15 MED ORDER — CISATRACURIUM BOLUS VIA INFUSION
0.0500 mg/kg | INTRAVENOUS | Status: DC | PRN
  Filled 2018-03-15: qty 5

## 2018-03-15 MED ORDER — SODIUM CHLORIDE 0.9 % IV SOLN
INTRAVENOUS | Status: DC
  Administered 2018-03-15: 09:00:00 via INTRAVENOUS

## 2018-03-15 MED ORDER — ACETAMINOPHEN 650 MG RE SUPP: 650.0000 mg | Freq: Four times a day (QID) | RECTAL | Status: DC | PRN

## 2018-03-15 MED ORDER — FENTANYL BOLUS VIA INFUSION
50.0000 ug | INTRAVENOUS | Status: DC | PRN
  Filled 2018-03-15: qty 50

## 2018-03-15 MED ORDER — ACETAMINOPHEN 325 MG PO TABS: 650.0000 mg | ORAL_TABLET | Freq: Four times a day (QID) | ORAL | Status: DC | PRN

## 2018-03-15 MED ORDER — LACTATED RINGERS IV SOLN: INTRAVENOUS | Status: DC

## 2018-03-15 MED ORDER — FAMOTIDINE IN NACL 20-0.9 MG/50ML-% IV SOLN
20.0000 mg | Freq: Two times a day (BID) | INTRAVENOUS | Status: DC
  Administered 2018-03-15: 20 mg via INTRAVENOUS
  Filled 2018-03-15: qty 50

## 2018-03-15 MED ORDER — FENTANYL 2500MCG IN NS 250ML (10MCG/ML) PREMIX INFUSION
100.0000 ug/h | INTRAVENOUS | Status: DC
  Administered 2018-03-15: 100 ug/h via INTRAVENOUS
  Filled 2018-03-15: qty 250

## 2018-03-15 MED ORDER — SODIUM CHLORIDE 0.9 % IV SOLN
1.0000 ug/kg/min | INTRAVENOUS | Status: DC
  Administered 2018-03-15: 1 ug/kg/min via INTRAVENOUS
  Filled 2018-03-15: qty 20

## 2018-03-15 MED ORDER — EPINEPHRINE PF 1 MG/ML IJ SOLN
0.5000 ug/min | INTRAVENOUS | Status: DC
  Filled 2018-03-15: qty 4

## 2018-03-15 MED ORDER — PROPOFOL 1000 MG/100ML IV EMUL
5.0000 ug/kg/min | INTRAVENOUS | Status: DC
  Administered 2018-03-15: 5 ug/kg/min via INTRAVENOUS

## 2018-03-15 MED ORDER — NOREPINEPHRINE 4 MG/250ML-% IV SOLN
0.0000 ug/min | INTRAVENOUS | Status: DC
  Administered 2018-03-15: 15 ug/min via INTRAVENOUS
  Filled 2018-03-15: qty 250

## 2018-03-15 MED ORDER — CISATRACURIUM BOLUS VIA INFUSION
0.1000 mg/kg | Freq: Once | INTRAVENOUS | Status: DC
  Filled 2018-03-15: qty 10

## 2018-03-15 MED ORDER — ARTIFICIAL TEARS OPHTHALMIC OINT
1.0000 "application " | TOPICAL_OINTMENT | Freq: Three times a day (TID) | OPHTHALMIC | Status: DC
  Administered 2018-03-15: 1 via OPHTHALMIC
  Filled 2018-03-15 (×2): qty 3.5

## 2018-03-15 MED ORDER — PIPERACILLIN-TAZOBACTAM 3.375 G IVPB
3.3750 g | Freq: Three times a day (TID) | INTRAVENOUS | Status: DC
  Administered 2018-03-15: 3.375 g via INTRAVENOUS

## 2018-03-15 MED ORDER — CHLORHEXIDINE GLUCONATE 0.12% ORAL RINSE (MEDLINE KIT)
15.0000 mL | Freq: Two times a day (BID) | OROMUCOSAL | Status: DC
  Administered 2018-03-15: 15 mL via OROMUCOSAL

## 2018-03-15 MED ORDER — PROPOFOL 1000 MG/100ML IV EMUL
INTRAVENOUS | Status: AC
  Administered 2018-03-15: 5 ug/kg/min via INTRAVENOUS
  Filled 2018-03-15: qty 100

## 2018-03-15 MED ORDER — NOREPINEPHRINE 4 MG/250ML-% IV SOLN
0.0000 ug/min | INTRAVENOUS | Status: DC
  Administered 2018-03-15: 2 ug/min via INTRAVENOUS

## 2018-03-15 MED ORDER — ONDANSETRON HCL 4 MG PO TABS: 4.0000 mg | ORAL_TABLET | Freq: Four times a day (QID) | ORAL | Status: DC | PRN

## 2018-03-15 MED ORDER — SODIUM CHLORIDE 0.9 % IV SOLN: INTRAVENOUS | Status: DC

## 2018-03-15 MED ORDER — INSULIN REGULAR(HUMAN) IN NACL 100-0.9 UT/100ML-% IV SOLN
INTRAVENOUS | Status: DC
  Administered 2018-03-15: 2.9 [IU]/h via INTRAVENOUS
  Filled 2018-03-15: qty 100

## 2018-03-15 MED ORDER — ONDANSETRON HCL 4 MG/2ML IJ SOLN: 4.0000 mg | Freq: Four times a day (QID) | INTRAMUSCULAR | Status: DC | PRN

## 2018-03-15 MED ORDER — ENOXAPARIN SODIUM 40 MG/0.4ML ~~LOC~~ SOLN: 40.0000 mg | SUBCUTANEOUS | Status: DC

## 2018-03-15 MED ORDER — INSULIN ASPART 100 UNIT/ML ~~LOC~~ SOLN: 0.0000 [IU] | Freq: Every day | SUBCUTANEOUS | Status: DC

## 2018-03-15 MED ORDER — IPRATROPIUM-ALBUTEROL 0.5-2.5 (3) MG/3ML IN SOLN
3.0000 mL | Freq: Four times a day (QID) | RESPIRATORY_TRACT | Status: DC
  Administered 2018-03-15: 3 mL via RESPIRATORY_TRACT
  Filled 2018-03-15: qty 3

## 2018-03-15 MED ORDER — VANCOMYCIN HCL 10 G IV SOLR
1750.0000 mg | INTRAVENOUS | Status: DC
  Filled 2018-03-15: qty 1750

## 2018-03-15 MED ORDER — ASPIRIN 81 MG PO CHEW: 81.0000 mg | CHEWABLE_TABLET | Freq: Every day | ORAL | Status: DC

## 2018-03-15 MED ORDER — ASPIRIN 300 MG RE SUPP
300.0000 mg | RECTAL | Status: AC
  Administered 2018-03-15: 300 mg via RECTAL
  Filled 2018-03-15: qty 1

## 2018-03-15 MED FILL — Medication: Qty: 1 | Status: AC

## 2018-03-16 LAB — URINE CULTURE: Culture: NO GROWTH

## 2018-03-18 ENCOUNTER — Telehealth: Payer: Self-pay

## 2018-03-18 NOTE — Telephone Encounter (Signed)
Faxed death certificate received from [employer]. Placed in DK box for signing. Original to be mailed to the office.

## 2018-03-19 ENCOUNTER — Telehealth: Payer: Self-pay

## 2018-03-19 NOTE — Telephone Encounter (Signed)
Death certificate completed, copy faxed to [employer], original mailed to [facility] Department.

## 2018-03-20 LAB — CULTURE, BLOOD (ROUTINE X 2)
CULTURE: NO GROWTH
Culture: NO GROWTH
SPECIAL REQUESTS: ADEQUATE
Special Requests: ADEQUATE

## 2018-03-21 NOTE — Progress Notes (Signed)
PATIENT NAME: Stephanie Short MEDICAL RECORD NUMBER: [id number] Birthday: 1950-05-20  Age: 68 y.o. Admit Date: 04/14/2018  Provider: Mortimer Fries  Indication: cardiac arrest  Technical Description:   CPR performance duration: 3 minutes    Medications Administered Include      Yes/no Amiodarone   Atropin   Calcium   Epinephrine 2  Lidocaine   Magnesium   Norepinephrine   Phenylephrine   Sodium bicarbonate 3  Vasopression    AT this time, patient has coded several times-will stop Hypothermia protocol.    Corrin Parker, M.D.  [facility]  Medical Director [place] Director [facility] Cardio-Pulmonary Department

## 2018-03-21 NOTE — Consult Note (Signed)
CRITICAL CARE NOTE  CC  Acute cardiac arrest   HPI 68 yo female with PMH of COPD and Pulmonary HTN (on study drug at [facility] for same).   C/o SOB and placed on CPAP by EMS.  Arrived in ED pulseless. CPR intiated unknown time-but looks like around 10-15 minutes  Patient intubated and ventilated.  Intubated Sedated Vent support  Critically ill   Active Ambulatory Problems    Diagnosis Date Noted  . HYPERLIPIDEMIA-MIXED 10/29/2009  . AORTIC ATHEROSCLEROSIS 10/29/2009  . Endometrial polyp 08/15/2014  . Chronic low back pain (Location of Primary Source of Pain) (Bilateral) (R>L) 01/31/2015  . Lumbar spondylosis 01/31/2015  . 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 Lumbar facet syndrome (Bilateral) (R>L) 01/31/2015  . Long term current use of opiate analgesic 01/31/2015  . Long term prescription opiate use 01/31/2015  . Opiate use (22.5 MME/Day) 01/31/2015  . Encounter for therapeutic drug level monitoring 01/31/2015  . Encounter for chronic pain management 01/31/2015  . Airway hyperreactivity 06/12/2013  . Atherosclerosis of abdominal aorta ([place]) 12/31/2013  . Type 2 diabetes mellitus with hyperglycemia, without long-term current use of insulin ([place]) 06/12/2013  . Benign essential hypertension 06/12/2013  . Cannot sleep 06/12/2013  . Major depression in remission ([place]) 01/11/2014  . Burning or prickling sensation 06/29/2013  . Hemorrhage, postmenopausal 08/02/2014  . Pure hypercholesterolemia 05/23/2014  . Osteopenia 01/31/2015  . Chronic hip pain(2) (Bilateral) (L>R) 02/01/2015  . Greater trochanteric bursitis of hips (Bilateral) 02/01/2015  . Chronic sacroiliac joint pain (Bilateral) 02/01/2015  . Osteoarthritis of hips (Bilateral) 02/01/2015  . Pain management 02/01/2015  . Chronic pain of lower extremity  (Bilateral) (L>R) 02/01/2015  . Postmenopausal osteoporosis 02/01/2015  . Chronic cervical radicular pain 02/01/2015  . Neurogenic pain 02/01/2015  . Chronic  kidney disease 02/01/2015  . Vitamin B12 deficiency 04/28/2015  . Problem with medical care compliance 05/23/2015  . Poor historian 05/23/2015  . Asthma 06/12/2013  . Chronic neck pain (3) (B) (R>L) 10/17/2015  . Monoclonal gammopathy 12/21/2015  . Chronic pain syndrome 01/29/2016  . Chronic left shoulder pain 03/31/2017  . DDD (degenerative disc disease), cervical 08/29/2016  . Healthcare maintenance 07/30/2016  . COPD (chronic obstructive pulmonary disease) ([place]) 08/13/2017  . Acute diastolic CHF (congestive heart failure) ([place]) 08/13/2017  . Chronic diastolic heart failure ([place]) 08/26/2017  . Hyperlipidemia associated with type 2 diabetes mellitus ([place]) 09/29/2017  . Hypertension associated with chronic kidney disease due to type 2 diabetes mellitus ([place]) 09/29/2017  . Chronic respiratory failure with hypoxia ([place]) 11/16/2017  . Obstructive sleep apnea syndrome 11/16/2017  . Secondary pulmonary arterial hypertension ([place]) 11/16/2017   Resolved Ambulatory Problems    Diagnosis Date Noted  . Acute respiratory failure with hypoxia ([place]) 08/13/2017  . Acute respiratory failure ([place]) 08/13/2017   Past Medical History:  Diagnosis Date  . CHF (congestive heart failure) ([place])   . Depression   . Diabetes mellitus   . Hepatitis C   . Hypertension   . Insomnia   . Oxygen dependent    Family History  Problem Relation Age of Onset  . Coronary artery disease Mother 30  . Coronary artery disease Brother 27  . Heart attack Brother        MI  . Heart disease Father   . Diabetes Father   . Diabetes Other   . Hypertension Other   . Colon cancer Other   . Breast cancer Neg Hx    Past Surgical History:  Procedure Laterality Date  .  BACK SURGERY  2013   [facility] Dr. Mauri Pole  . COLONOSCOPY W/ POLYPECTOMY    . COLONOSCOPY WITH PROPOFOL N/A 03/13/2015   Procedure: COLONOSCOPY WITH PROPOFOL;  Surgeon: Lollie Sails, MD;  Location: [facility] ENDOSCOPY;  Service: Endoscopy;  Laterality: N/A;    . HYSTEROSCOPY W/D&C N/A 08/18/2014   Procedure: DILATATION AND CURETTAGE /HYSTEROSCOPY;  Surgeon: Lorette Ang, MD;  Location: [facility] ORS;  Service: Gynecology;  Laterality: N/A;  . KNEE ARTHROSCOPY Right   . RIGHT/LEFT HEART CATH AND CORONARY ANGIOGRAPHY N/A 09/29/2017   Procedure: RIGHT/LEFT HEART CATH AND CORONARY ANGIOGRAPHY;  Surgeon: Yolonda Kida, MD;  Location: [place] CV LAB;  Service: Cardiovascular;  Laterality: N/A;       SIGNIFICANT EVENTS 2/24 intubated, cardiac arrest-hypothermia protocol started   BP 105/79 (BP Location: Right Arm)   Pulse 80   Temp (!) 96.6 F (35.9 C) (Bladder)   Resp 18   Ht _0  (1.651 m)   Wt 91 kg   SpO2 (!) 89%   BMI 33.38 kg/m    REVIEW OF SYSTEMS  PATIENT IS UNABLE TO PROVIDE COMPLETE REVIEW OF SYSTEMS DUE TO SEVERE CRITICAL ILLNESS   PHYSICAL EXAMINATION:  GENERAL:critically ill appearing, +resp distress HEAD: Normocephalic, atraumatic.  EYES: Pupils equal, round, reactive to light.  No scleral icterus.  MOUTH: Moist mucosal membrane. NECK: Supple. No thyromegaly. No nodules. No JVD.  PULMONARY: +rhonchi, +wheezing CARDIOVASCULAR: S1 and S2. Regular rate and rhythm. No murmurs, rubs, or gallops.  GASTROINTESTINAL: Soft, nontender, -distended. No masses. Positive bowel sounds. No hepatosplenomegaly.  MUSCULOSKELETAL: No swelling, clubbing, or edema.  NEUROLOGIC: obtunded, GCS<8 SKIN:intact,warm,dry      Indwelling Urinary Catheter continued, requirement due to   Reason to continue Indwelling Urinary Catheter for strict Intake/Output monitoring for hemodynamic instability   Central Line continued, requirement due to   Reason to continue [employer] Monitoring of central venous pressure or other hemodynamic parameters   Ventilator continued, requirement due to, resp failure    Ventilator Sedation RASS 0 to -2   I have Independently reviewed images of  CXR Interpretation:  B/l opacities Lower  lobe Probable pneumonia/edema  ASSESSMENT AND PLAN SYNOPSIS   Severe Hypoxic and Hypercapnic Respiratory Failure cardiac arrest likely from  NSTEMI/pneumonia -continue Full MV support -continue Bronchodilator Therapy -Wean Fio2 and PEEP as tolerated -will perform SAT/SBT when respiratory parameters are met   CARDIAC FAILURE-check ECHO ICU monitoring Will start hypothermia protocol   NEUROLOGY - intubated and sedated - minimal sedation to achieve a RASS goal: -1   CARDIAC ICU monitoring  ID -continue IV abx as prescibed -follow up cultures Check pro calcitonin levels  GI/Nutrition GI PROPHYLAXIS as indicated DIET-->start  TF's as tolerated Constipation protocol as indicated  ENDO - ICU hypoglycemic\Hyperglycemia protocol -check FSBS per protocol   ELECTROLYTES -follow labs as needed -replace as needed -pharmacy consultation and following   DVT/GI PRX ordered TRANSFUSIONS AS NEEDED MONITOR FSBS ASSESS the need for LABS as needed   Critical Care Time devoted to patient care services described in this note is 45 minutes.   Overall, patient is critically ill, prognosis is guarded.  Patient with Multiorgan failure and at high risk for cardiac arrest and death.    Corrin Parker, M.D.  [facility]  Medical Director [place] Director [facility] Cardio-Pulmonary Department

## 2018-03-21 NOTE — ED Notes (Signed)
1 amp of Calcium Carbonate given IVP at this time.

## 2018-03-21 NOTE — ED Notes (Signed)
1st dose of Epi given (35m/10mL) IVP

## 2018-03-21 NOTE — H&P (Signed)
Stephanie Short is an 68 y.o. female.   Chief Complaint: Shortness of breath HPI: The patient with past medical history of hypertension, diabetes, CHF, COPD and pulmonary hypertension presents to the emergency department via EMS after respiratory distress at home.  The patient could not catch her breath despite CPAP and 100% FiO2.  She was given magnesium in route and reportedly was stable, conscious and verbal in the ambulance.  However upon arrival to the ED room the patient lost pulses and required brief CPR prior to intubation.  Once the patient was stabilized the emergency department staff called the hospitalist service for admission.  Past Medical History:  Diagnosis Date  . Asthma   . CHF (congestive heart failure) ([place])   . Depression   . Diabetes mellitus   . Hepatitis C    Dr. 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 Hypertension   . Insomnia   . Oxygen dependent     Past Surgical History:  Procedure Laterality Date  . BACK SURGERY  2013   [facility] Dr. Mauri Pole  . COLONOSCOPY W/ POLYPECTOMY    . COLONOSCOPY WITH PROPOFOL N/A 03/13/2015   Procedure: COLONOSCOPY WITH PROPOFOL;  Surgeon: Lollie Sails, MD;  Location: [facility] ENDOSCOPY;  Service: Endoscopy;  Laterality: N/A;  . HYSTEROSCOPY W/D&C N/A 08/18/2014   Procedure: DILATATION AND CURETTAGE /HYSTEROSCOPY;  Surgeon: Lorette Ang, MD;  Location: [facility] ORS;  Service: Gynecology;  Laterality: N/A;  . KNEE ARTHROSCOPY Right   . RIGHT/LEFT HEART CATH AND CORONARY ANGIOGRAPHY N/A 09/29/2017   Procedure: RIGHT/LEFT HEART CATH AND CORONARY ANGIOGRAPHY;  Surgeon: Yolonda Kida, MD;  Location: [place] CV LAB;  Service: Cardiovascular;  Laterality: N/A;    Family History  Problem Relation Age of Onset  . Coronary artery disease Mother 14  . Coronary artery disease Brother 88  . Heart attack Brother        MI  . Heart disease Father   . Diabetes Father   . Diabetes Other   . Hypertension Other   . Colon cancer  Other   . Breast cancer Neg Hx    Social History:  reports that she quit smoking about 14 years ago. Her smoking use included cigarettes. She has a 20.00 pack-year smoking history. She has never used smokeless tobacco. She reports current alcohol use. She reports that she does not use drugs.  Allergies:  Allergies  Allergen Reactions  . Codeine Nausea And Vomiting  . Dilaudid [Hydromorphone Hcl] Nausea And Vomiting  . Tramadol Nausea And Vomiting and Nausea Only  . Gabapentin Nausea And Vomiting and Other (See Comments)    Dizziness and GI upset  . Hydrocodone-Acetaminophen Nausea And Vomiting and Nausea Only  . Oxycodone-Acetaminophen Nausea Only  . Promethazine Other (See Comments)    Reaction:  GI upset   . Scallops [Shellfish Allergy] Nausea And Vomiting    Medications Prior to Admission  Medication Sig Dispense Refill  . acetaminophen (TYLENOL) 325 MG tablet Take 2 tablets (650 mg total) by mouth every 6 (six) hours as needed for mild pain or headache. (Patient taking differently: Take 500 mg by mouth every 6 (six) hours as needed for mild pain or headache. ) 30 tablet 0  . albuterol (PROVENTIL HFA;VENTOLIN HFA) 108 (90 Base) MCG/ACT inhaler Inhale 2 puffs into the lungs every 6 (six) hours as needed for wheezing or shortness of breath.    Marland Kitchen aspirin EC 81 MG tablet Take 81 mg by mouth at  bedtime.    . Blood Glucose Monitoring Suppl (FIFTY50 GLUCOSE METER 2.0) w/Device KIT Use as directed. Dx E11.22 ONE TOUCH    . carvedilol (COREG) 6.25 MG tablet Take 6.25 mg by mouth 2 (two) times daily with a meal.    . felodipine (PLENDIL) 5 MG 24 hr tablet Take 1 tablet (5 mg total) by mouth every morning. (Patient taking differently: Take 10 mg by mouth daily. ) 30 tablet 2  . furosemide (LASIX) 40 MG tablet Take 1 tablet (40 mg total) by mouth daily. 30 tablet 2  . glimepiride (AMARYL) 2 MG tablet Take 2 mg by mouth daily with breakfast.    . ipratropium-albuterol (DUONEB) 0.5-2.5 (3) MG/3ML  SOLN Take 3 mLs by nebulization every 6 (six) hours as needed (wheezing, shortness of breath). (Patient taking differently: Take 3 mLs by nebulization 3 (three) times daily. ) 360 mL 1  . ondansetron (ZOFRAN ODT) 4 MG disintegrating tablet Allow 1-2 tablets to dissolve in your mouth every 8 hours as needed for nausea/vomiting 30 tablet 0  . ONE TOUCH ULTRA TEST test strip USE TO CHECK BLOOD SUGAR 1-2 TIMES DAILY AS DIRECTED. DX E11.22.  5  . oxyCODONE (OXY IR/ROXICODONE) 5 MG immediate release tablet Take 0.5 tablets (2.5 mg total) by mouth every 4 (four) hours as needed for severe pain. 90 tablet 0  . potassium chloride 20 MEQ TBCR Take 20 mEq by mouth daily. While taking lasix 30 tablet 2  . sitaGLIPtin (JANUVIA) 100 MG tablet Take 100 mg by mouth daily.    Marland Kitchen umeclidinium-vilanterol (ANORO ELLIPTA) 62.5-25 MCG/INH AEPB Inhale 1 puff into the lungs daily.     . vitamin B-12 (CYANOCOBALAMIN) 1000 MCG tablet Take 1 tablet (1,000 mcg total) by mouth daily. 30 tablet 1    Results for orders placed or performed during the hospital encounter of 04/14/18 (from the past 48 hour(s))  Glucose, capillary     Status: Abnormal   Collection Time: 2018-04-14  3:27 AM  Result Value Ref Range   Glucose-Capillary 333 (H) 70 - 99 mg/dL  CBC with Differential     Status: Abnormal   Collection Time: 2018/04/14  3:34 AM  Result Value Ref Range   WBC 16.9 (H) 4.0 - 10.5 K/uL   RBC 5.70 (H) 3.87 - 5.11 MIL/uL   Hemoglobin 15.3 (H) 12.0 - 15.0 g/dL   HCT 49.9 (H) 36.0 - 46.0 %   MCV 87.5 80.0 - 100.0 fL   MCH 26.8 26.0 - 34.0 pg   MCHC 30.7 30.0 - 36.0 g/dL   RDW 15.8 (H) 11.5 - 15.5 %   Platelets 230 150 - 400 K/uL   nRBC 0.2 0.0 - 0.2 %   Neutrophils Relative % 67 %   Neutro Abs 11.5 (H) 1.7 - 7.7 K/uL   Lymphocytes Relative 24 %   Lymphs Abs 4.0 0.7 - 4.0 K/uL   Monocytes Relative 5 %   Monocytes Absolute 0.8 0.1 - 1.0 K/uL   Eosinophils Relative 1 %   Eosinophils Absolute 0.1 0.0 - 0.5 K/uL   Basophils  Relative 1 %   Basophils Absolute 0.1 0.0 - 0.1 K/uL   Immature Granulocytes 2 %   Abs Immature Granulocytes 0.39 (H) 0.00 - 0.07 K/uL    Comment: Performed at [facility], [address], [place], [place] [address]  Comprehensive metabolic panel     Status: Abnormal   Collection Time: 2018-04-14  3:34 AM  Result Value Ref Range   Sodium  140 135 - 145 mmol/L   Potassium 3.6 3.5 - 5.1 mmol/L   Chloride 105 98 - 111 mmol/L   CO2 25 22 - 32 mmol/L   Glucose, Bld 362 (H) 70 - 99 mg/dL   BUN 17 8 - 23 mg/dL   Creatinine, Ser 1.13 (H) 0.44 - 1.00 mg/dL   Calcium 8.6 (L) 8.9 - 10.3 mg/dL   Total Protein 7.7 6.5 - 8.1 g/dL   Albumin 3.9 3.5 - 5.0 g/dL   AST 25 15 - 41 U/L   ALT 15 0 - 44 U/L   Alkaline Phosphatase 98 38 - 126 U/L   Total Bilirubin 0.5 0.3 - 1.2 mg/dL   GFR calc non Af Amer 50 (L) >60 mL/min   GFR calc Af Amer 58 (L) >60 mL/min   Anion gap 10 5 - 15    Comment: Performed at [facility], [address] [place], [place] [address]  Troponin I - Once     Status: None   Collection Time: 04-12-18  3:34 AM  Result Value Ref Range   Troponin I <0.03 <0.03 ng/mL    Comment: Performed at [facility], [address] [place], [place] [address]  Lactic acid, plasma     Status: Abnormal   Collection Time: 04-12-2018  3:47 AM  Result Value Ref Range   Lactic Acid, Venous 8.0 (HH) 0.5 - 1.9 mmol/L    Comment: CRITICAL RESULT CALLED TO, READ BACK BY AND VERIFIED WITH BUTCH WOODS AT 2423 April 12, 2018.  TFK Performed at [facility], [address] [place], [place] [address]   Urinalysis, Complete w Microscopic     Status: Abnormal   Collection Time: Apr 12, 2018  3:55 AM  Result Value Ref Range   Color, Urine YELLOW (A) YELLOW   APPearance HAZY (A) CLEAR   Specific Gravity, Urine 1.025 1.005 - 1.030   pH 6.0 5.0 - 8.0   Glucose, UA >=500 (A) NEGATIVE mg/dL   Hgb urine dipstick NEGATIVE NEGATIVE   Bilirubin Urine NEGATIVE NEGATIVE   Ketones,  ur NEGATIVE NEGATIVE mg/dL   Protein, ur >=300 (A) NEGATIVE mg/dL   Nitrite NEGATIVE NEGATIVE   Leukocytes,Ua NEGATIVE NEGATIVE   RBC / HPF 0-5 0 - 5 RBC/hpf   WBC, UA 0-5 0 - 5 WBC/hpf   Bacteria, UA RARE (A) NONE SEEN   Squamous Epithelial / LPF 0-5 0 - 5   Mucus PRESENT     Comment: Performed at [facility], [address] [place], [place] [address]  Blood gas, arterial     Status: Abnormal   Collection Time: Apr 12, 2018  3:57 AM  Result Value Ref Range   FIO2 1.00    Delivery systems VENTILATOR    Mode ASSIST CONTROL    VT 500 mL   Peep/cpap 5.0 cm H20   pH, Arterial 7.16 (LL) 7.350 - 7.450    Comment: CRITICAL RESULT CALLED TO, READ BACK BY AND VERIFIED WITH: DR SUNG [id number] DT    pCO2 arterial 60 (H) 32.0 - 48.0 mmHg   pO2, Arterial 153 (H) 83.0 - 108.0 mmHg   Bicarbonate 21.4 20.0 - 28.0 mmol/L   Acid-base deficit 8.0 (H) 0.0 - 2.0 mmol/L   O2 Saturation 98.8 %   Patient temperature 37.0    Collection site RIGHT RADIAL    Sample type ARTERIAL DRAW    Allens test (pass/fail) PASS PASS   Mechanical Rate 20     Comment: Performed at [facility]  Lab, [place], [place] [address]   Dg Abdomen 1 View  Result Date: 2018-03-20 CLINICAL DATA:  68 year old female with enteric tube placement. EXAM: ABDOMEN - 1 VIEW COMPARISON:  Abdominal CT dated 12/01/2014. FINDINGS: Partially visualized enteric tube with tip and side-port in the left upper abdomen likely in the stomach. Air is noted within the colon. There is degenerative changes of the spine. The soft tissues are grossly unremarkable. Bibasilar interstitial coarsening. IMPRESSION: Enteric tube with tip in the left upper abdomen likely in the stomach. Electronically Signed   By: Anner Crete M.D.   On: 03-20-2018 05:33   Dg Chest Port 1 View  Result Date: 2018/03/20 CLINICAL DATA:  68 year old female with respiratory failure. Status post intubation. EXAM: PORTABLE CHEST 1 VIEW COMPARISON:   Chest CT dated 08/13/2017 FINDINGS: Endotracheal tube with tip close to the carina tilting towards the left mainstem bronchus. Recommend retraction by approximately 3 cm. There is cardiomegaly. Diffuse interstitial coarsening and patchy bilateral lower lung field densities which may represent combination of atelectasis or pneumonia on a background of interstitial lung disease. No large pleural effusion. No pneumothorax. No acute osseous pathology. IMPRESSION: 1. Endotracheal tube with tip close to the carina. Recommend retraction by approximately 3 cm. 2. Cardiomegaly. 3. Bilateral lower lung field airspace densities. Electronically Signed   By: Anner Crete M.D.   On: 03/20/2018 04:43    Review of Systems  Unable to perform ROS: Intubated    Blood pressure 105/79, pulse 80, temperature (!) 96.6 F (35.9 C), temperature source Bladder, resp. rate 18, height 5' 5" (1.651 m), weight 91 kg, SpO2 (!) 89 %. Physical Exam  Vitals reviewed. Constitutional: She is oriented to person, place, and time. She appears well-developed and well-nourished. No distress. She is sedated and intubated.  HENT:  Head: Normocephalic and atraumatic.  Eyes: Pupils are equal, round, and reactive to light. Conjunctivae and EOM are normal. No scleral icterus.  Neck: Normal range of motion. Neck supple. No JVD present. No tracheal deviation present. No thyromegaly present.  Cardiovascular: Normal rate, regular rhythm and normal heart sounds. Exam reveals no gallop and no friction rub.  No murmur heard. Respiratory: Effort normal and breath sounds normal. She is intubated.  GI: Soft. Bowel sounds are normal. She exhibits no distension. There is no abdominal tenderness.  Genitourinary:    Genitourinary Comments: Deferred   Musculoskeletal: Normal range of motion.        General: No edema.  Lymphadenopathy:    She has no cervical adenopathy.  Neurological: She is alert and oriented to person, place, and time. No cranial  nerve deficit. She exhibits normal muscle tone.  Skin: Skin is warm and dry. No rash noted. No erythema.  Psychiatric:  Patient is intubated and sedated     Assessment/Plan This is a 68 year old female admitted for respiratory failure. 1.  Respiratory failure: Acute; with hypoxia.  Repeat ABG following intubation due to respiratory arrest.  Continue mechanical ventilation.  The patient is no longer on Levophed.   2.  COPD: Severe; duo nebs every 6 hours.  Wean O2 as tolerated to maintain oxygen saturations 88 to 92% 3.  Pulmonary hypertension: Arterial; consider addition of nitrous oxide inhibitor and/or nitrous oxide through vent circuit 4.  CAD: Stable.  Heart cath from September 2019 shows proximal LAD 50% lesion as well as first diagonal branch lesion 75%.  Continue aspirin 5.  Hypertension: Hold antihypertensive medication for now 6.  Diabetes mellitus type 2: Blood sugar  checks every 4 while n.p.o.  Sliding scale insulin while hospitalized 7.  DVT prophylaxis: Lovenox 8.  GI prophylaxis: Pantoprazole following 24 hours intubation The patient is a full code.  I have personally spent 45 minutes in critical care time with this patient  Harrie Foreman, MD April 11, 2018, 5:43 AM

## 2018-03-21 NOTE — ED Notes (Signed)
1 amp (50 mEq) Sodium Bicarbonate given IVP

## 2018-03-21 NOTE — Progress Notes (Signed)
Patient passed at 1047am. Patient was extubated at 10:50am.

## 2018-03-21 NOTE — ED Notes (Signed)
ED TO INPATIENT HANDOFF REPORT  Name/Age/Gender Stephanie Short 68 y.o. female  Code Status Code Status History    Date Active Date Inactive Code Status Order ID Comments User Context   09/29/2017 1402 09/29/2017 1941 Full Code [id number]  Yolonda Kida, MD Inpatient   08/13/2017 2050 08/19/2017 1815 Full Code [id number]  Vaughan Basta, MD Inpatient      Home/SNF/Other Home  Chief Complaint diff breathing  Level of Care/Admitting Diagnosis ED Disposition    ED Disposition Condition [place]: [place] [[id number]]  Level of Care: ICU [6]  Diagnosis: Acute respiratory failure with hypoxemia [facility]) [[id number]]  Admitting Physician: Harrie Foreman [[id number]]  Attending Physician: Harrie Foreman [[id number]]  Estimated length of stay: past midnight tomorrow  Certification:: I certify this patient will need inpatient services for at least 2 midnights  PT Class (Do Not Modify): Inpatient [101]  PT Acc Code (Do Not Modify): Private [1]       Medical History Past Medical History:  Diagnosis Date  . Asthma   . CHF (congestive heart failure) ([place])   . Depression   . Diabetes mellitus   . Hepatitis C    Dr. 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 Hypertension   . Insomnia   . Oxygen dependent     Allergies Allergies  Allergen Reactions  . Codeine Nausea And Vomiting  . Dilaudid [Hydromorphone Hcl] Nausea And Vomiting  . Tramadol Nausea And Vomiting and Nausea Only  . Gabapentin Nausea And Vomiting and Other (See Comments)    Dizziness and GI upset  . Hydrocodone-Acetaminophen Nausea And Vomiting and Nausea Only  . Oxycodone-Acetaminophen Nausea Only  . Promethazine Other (See Comments)    Reaction:  GI upset   . Scallops [Shellfish Allergy] Nausea And Vomiting    IV Location/Drains/Wounds Patient Lines/Drains/Airways Status   Active Line/Drains/Airways    Name:   Placement date:   Placement time:    Site:   Days:   Peripheral IV 19-Mar-2018 Left;Upper Forearm   03-19-2018    0337    Forearm   less than 1   Peripheral IV 19-Mar-2018 Left Arm   03-19-2018    0343    Arm   less than 1   Peripheral IV 19-Mar-2018 Left Antecubital   03-19-2018    0346    Antecubital   less than 1   NG/OG Tube Orogastric 16 Fr. Center mouth Xray;Aucultation Documented cm marking at nare/ corner of mouth 58 cm   Mar 19, 2018    0439    Center mouth   less than 1   Urethral Catheter Beth, EDT Double-lumen 16 Fr.   2018-03-19    0353    Double-lumen   less than 1   Airway 7.5 mm   19-Mar-2018    0350     less than 1   Intraosseous Line 03/19/18 Tibia   March 19, 2018    0342    Left   less than 1          Labs/Imaging Results for orders placed or performed during the hospital encounter of March 19, 2018 (from the past 48 hour(s))  Glucose, capillary     Status: Abnormal   Collection Time: 2018-03-19  3:27 AM  Result Value Ref Range   Glucose-Capillary 333 (H) 70 - 99 mg/dL  CBC with Differential     Status: Abnormal   Collection Time: Mar 19, 2018  3:34 AM  Result Value Ref  Range   WBC 16.9 (H) 4.0 - 10.5 K/uL   RBC 5.70 (H) 3.87 - 5.11 MIL/uL   Hemoglobin 15.3 (H) 12.0 - 15.0 g/dL   HCT 49.9 (H) 36.0 - 46.0 %   MCV 87.5 80.0 - 100.0 fL   MCH 26.8 26.0 - 34.0 pg   MCHC 30.7 30.0 - 36.0 g/dL   RDW 15.8 (H) 11.5 - 15.5 %   Platelets 230 150 - 400 K/uL   nRBC 0.2 0.0 - 0.2 %   Neutrophils Relative % 67 %   Neutro Abs 11.5 (H) 1.7 - 7.7 K/uL   Lymphocytes Relative 24 %   Lymphs Abs 4.0 0.7 - 4.0 K/uL   Monocytes Relative 5 %   Monocytes Absolute 0.8 0.1 - 1.0 K/uL   Eosinophils Relative 1 %   Eosinophils Absolute 0.1 0.0 - 0.5 K/uL   Basophils Relative 1 %   Basophils Absolute 0.1 0.0 - 0.1 K/uL   Immature Granulocytes 2 %   Abs Immature Granulocytes 0.39 (H) 0.00 - 0.07 K/uL    Comment: Performed at [facility], [address] [place], [place] [address]  Comprehensive metabolic panel     Status: Abnormal   Collection Time:  2018-04-09  3:34 AM  Result Value Ref Range   Sodium 140 135 - 145 mmol/L   Potassium 3.6 3.5 - 5.1 mmol/L   Chloride 105 98 - 111 mmol/L   CO2 25 22 - 32 mmol/L   Glucose, Bld 362 (H) 70 - 99 mg/dL   BUN 17 8 - 23 mg/dL   Creatinine, Ser 1.13 (H) 0.44 - 1.00 mg/dL   Calcium 8.6 (L) 8.9 - 10.3 mg/dL   Total Protein 7.7 6.5 - 8.1 g/dL   Albumin 3.9 3.5 - 5.0 g/dL   AST 25 15 - 41 U/L   ALT 15 0 - 44 U/L   Alkaline Phosphatase 98 38 - 126 U/L   Total Bilirubin 0.5 0.3 - 1.2 mg/dL   GFR calc non Af Amer 50 (L) >60 mL/min   GFR calc Af Amer 58 (L) >60 mL/min   Anion gap 10 5 - 15    Comment: Performed at [facility], [address] [place], [place] [address]  Troponin I - Once     Status: None   Collection Time: 2018-04-09  3:34 AM  Result Value Ref Range   Troponin I <0.03 <0.03 ng/mL    Comment: Performed at [facility], [address] [place], [place] [address]  Lactic acid, plasma     Status: Abnormal   Collection Time: April 09, 2018  3:47 AM  Result Value Ref Range   Lactic Acid, Venous 8.0 (HH) 0.5 - 1.9 mmol/L    Comment: CRITICAL RESULT CALLED TO, READ BACK BY AND VERIFIED WITH BUTCH WOODS AT 4854 2018-04-09.  TFK Performed at [facility], [address] [place], [place] [address]   Urinalysis, Complete w Microscopic     Status: Abnormal   Collection Time: 2018-04-09  3:55 AM  Result Value Ref Range   Color, Urine YELLOW (A) YELLOW   APPearance HAZY (A) CLEAR   Specific Gravity, Urine 1.025 1.005 - 1.030   pH 6.0 5.0 - 8.0   Glucose, UA >=500 (A) NEGATIVE mg/dL   Hgb urine dipstick NEGATIVE NEGATIVE   Bilirubin Urine NEGATIVE NEGATIVE   Ketones, ur NEGATIVE NEGATIVE mg/dL   Protein, ur >=300 (A) NEGATIVE mg/dL   Nitrite NEGATIVE NEGATIVE   Leukocytes,Ua NEGATIVE NEGATIVE  RBC / HPF 0-5 0 - 5 RBC/hpf   WBC, UA 0-5 0 - 5 WBC/hpf   Bacteria, UA RARE (A) NONE SEEN   Squamous Epithelial / LPF 0-5 0 - 5   Mucus PRESENT     Comment: Performed  at [facility], [address] [place], [place] [address]  Blood gas, arterial     Status: Abnormal   Collection Time: 04/04/2018  3:57 AM  Result Value Ref Range   FIO2 1.00    Delivery systems VENTILATOR    Mode ASSIST CONTROL    VT 500 mL   Peep/cpap 5.0 cm H20   pH, Arterial 7.16 (LL) 7.350 - 7.450    Comment: CRITICAL RESULT CALLED TO, READ BACK BY AND VERIFIED WITH: DR SUNG [id number] DT    pCO2 arterial 60 (H) 32.0 - 48.0 mmHg   pO2, Arterial 153 (H) 83.0 - 108.0 mmHg   Bicarbonate 21.4 20.0 - 28.0 mmol/L   Acid-base deficit 8.0 (H) 0.0 - 2.0 mmol/L   O2 Saturation 98.8 %   Patient temperature 37.0    Collection site RIGHT RADIAL    Sample type ARTERIAL DRAW    Allens test (pass/fail) PASS PASS   Mechanical Rate 20     Comment: Performed at [facility], [address], [place],  [address]   Dg Abdomen 1 View  Result Date: 04-04-18 CLINICAL DATA:  68 year old female with enteric tube placement. EXAM: ABDOMEN - 1 VIEW COMPARISON:  Abdominal CT dated 12/01/2014. FINDINGS: Partially visualized enteric tube with tip and side-port in the left upper abdomen likely in the stomach. Air is noted within the colon. There is degenerative changes of the spine. The soft tissues are grossly unremarkable. Bibasilar interstitial coarsening. IMPRESSION: Enteric tube with tip in the left upper abdomen likely in the stomach. Electronically Signed   By: Anner Crete M.D.   On: 04-Apr-2018 05:33   Dg Chest Port 1 View  Result Date: 2018-04-04 CLINICAL DATA:  68 year old female with respiratory failure. Status post intubation. EXAM: PORTABLE CHEST 1 VIEW COMPARISON:  Chest CT dated 08/13/2017 FINDINGS: Endotracheal tube with tip close to the carina tilting towards the left mainstem bronchus. Recommend retraction by approximately 3 cm. There is cardiomegaly. Diffuse interstitial coarsening and patchy bilateral lower lung field densities which may represent combination  of atelectasis or pneumonia on a background of interstitial lung disease. No large pleural effusion. No pneumothorax. No acute osseous pathology. IMPRESSION: 1. Endotracheal tube with tip close to the carina. Recommend retraction by approximately 3 cm. 2. Cardiomegaly. 3. Bilateral lower lung field airspace densities. Electronically Signed   By: Anner Crete M.D.   On: 04-04-18 04:43    Pending Labs Unresulted Labs (From admission, onward)    Start     Ordered   April 04, 2018 0330  Culture, blood (routine x 2)  BLOOD CULTURE X 2,   STAT     April 04, 2018 0329   04-04-2018 0330  Lactic acid, plasma  Now then every 2 hours,   STAT     04-04-18 0329   2018-04-04 0330  Urine culture  Once,   STAT     04/04/18 0329   Signed and Held  Creatinine, serum  (enoxaparin (LOVENOX)    CrCl >/= 30 ml/min)  Weekly,   R    Comments:  while on enoxaparin therapy    Signed and Held   Signed and Held  TSH  Add-on,   R     Signed and  Held          Vitals/Pain Today's Vitals   18-Mar-2018 0502 18-Mar-2018 0517 03-18-2018 0523 03-18-2018 0531  BP: 102/75 103/76 104/79 105/79  Pulse: 82 81 80 80  Resp: _0 Temp:  (!) 96.6 F (35.9 C) (!) 96.6 F (35.9 C) (!) 96.6 F (35.9 C)  TempSrc: Bladder Bladder Bladder Bladder  SpO2: 91%  (!) 88% (!) 89%  Weight:      Height:      PainSc: Asleep Asleep Asleep Asleep    Isolation Precautions No active isolations  Medications Medications  norepinephrine (LEVOPHED) 80m in 2518mpremix infusion (0 mcg/min Intravenous Paused 02/21/25/2020359)  propofol (DIPRIVAN) 1000 MG/100ML infusion (10 mcg/kg/min  91 kg Intravenous Rate/Dose Change 02/21/25/2020531)  vancomycin (VANCOCIN) 1,750 mg in sodium chloride 0.9 % 500 mL IVPB (has no administration in time range)  piperacillin-tazobactam (ZOSYN) IVPB 3.375 g (3.375 g Intravenous New Bag/Given 02/21/25/20456)  sodium chloride 0.9 % bolus 1,000 mL (1,000 mLs Intravenous New Bag/Given 02/2018/02/27358)    Mobility walks

## 2018-03-21 NOTE — ED Provider Notes (Signed)
[facility] Emergency Department Provider Note   ____________________________________________   First MD Initiated Contact with Patient 03/21/2018 [phone]     (approximate)  I have reviewed the triage vital signs and the nursing notes.   HISTORY  Chief Complaint Shortness of Breath  Level V caveat: Limited by unresponsiveness  HPI Stephanie Short is a 68 y.o. female brought to the ED from home via EMS with a chief complaint of shortness of breath.  Per EMS, patient has a history of COPD on home oxygen.  During their transport they placed CPAP on the patient. Patient was unresponsive in respiratory arrest upon her arrival to the ED.    Past Medical History:  Diagnosis Date  . Asthma   . CHF (congestive heart failure) ([place])   . Depression   . Diabetes mellitus   . Hepatitis C    Dr. 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 Hypertension   . Insomnia   . Oxygen dependent     Patient Active Problem List   Diagnosis Date Noted  . Acute respiratory failure with hypoxemia ([place]) 03-21-2018  . Chronic respiratory failure with hypoxia ([place]) 11/16/2017  . Obstructive sleep apnea syndrome 11/16/2017  . Secondary pulmonary arterial hypertension ([place]) 11/16/2017  . Hyperlipidemia associated with type 2 diabetes mellitus ([place]) 09/29/2017  . Hypertension associated with chronic kidney disease due to type 2 diabetes mellitus ([place]) 09/29/2017  . Chronic diastolic heart failure ([place]) 08/26/2017  . COPD (chronic obstructive pulmonary disease) ([place]) 08/13/2017  . Acute diastolic CHF (congestive heart failure) ([place]) 08/13/2017  . Chronic left shoulder pain 03/31/2017  . DDD (degenerative disc disease), cervical 08/29/2016  . Healthcare maintenance 07/30/2016  . Chronic pain syndrome 01/29/2016  . Monoclonal gammopathy 12/21/2015  . Chronic neck pain (3) (B) (R>L) 10/17/2015  . Problem with medical care compliance 05/23/2015  . Poor historian 05/23/2015  .  Vitamin B12 deficiency 04/28/2015  . Chronic hip pain(2) (Bilateral) (L>R) 02/01/2015  . Greater trochanteric bursitis of hips (Bilateral) 02/01/2015  . Chronic sacroiliac joint pain (Bilateral) 02/01/2015  . Osteoarthritis of hips (Bilateral) 02/01/2015  . Pain management 02/01/2015  . Chronic pain of lower extremity  (Bilateral) (L>R) 02/01/2015  . Postmenopausal osteoporosis 02/01/2015  . Chronic cervical radicular pain 02/01/2015  . Neurogenic pain 02/01/2015  . Chronic kidney disease 02/01/2015  . Chronic low back pain (Location of Primary Source of Pain) (Bilateral) (R>L) 01/31/2015  . Lumbar spondylosis 01/31/2015  . 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 Lumbar facet syndrome (Bilateral) (R>L) 01/31/2015  . Long term current use of opiate analgesic 01/31/2015  . Long term prescription opiate use 01/31/2015  . Opiate use (22.5 MME/Day) 01/31/2015  . Encounter for therapeutic drug level monitoring 01/31/2015  . Encounter for chronic pain management 01/31/2015  . Osteopenia 01/31/2015  . Endometrial polyp 08/15/2014  . Hemorrhage, postmenopausal 08/02/2014  . Pure hypercholesterolemia 05/23/2014  . Major depression in remission ([place]) 01/11/2014  . Atherosclerosis of abdominal aorta ([place]) 12/31/2013  . Burning or prickling sensation 06/29/2013  . Airway hyperreactivity 06/12/2013  . Type 2 diabetes mellitus with hyperglycemia, without long-term current use of insulin ([place]) 06/12/2013  . Benign essential hypertension 06/12/2013  . Cannot sleep 06/12/2013  . Asthma 06/12/2013  . HYPERLIPIDEMIA-MIXED 10/29/2009  . AORTIC ATHEROSCLEROSIS 10/29/2009    Past Surgical History:  Procedure Laterality Date  . BACK SURGERY  2013   [facility] Dr. Mauri Pole  . COLONOSCOPY W/ POLYPECTOMY    . COLONOSCOPY  WITH PROPOFOL N/A 03/13/2015   Procedure: COLONOSCOPY WITH PROPOFOL;  Surgeon: Lollie Sails, MD;  Location: [facility] ENDOSCOPY;  Service: Endoscopy;  Laterality: N/A;  . HYSTEROSCOPY  W/D&C N/A 08/18/2014   Procedure: DILATATION AND CURETTAGE /HYSTEROSCOPY;  Surgeon: Lorette Ang, MD;  Location: [facility] ORS;  Service: Gynecology;  Laterality: N/A;  . KNEE ARTHROSCOPY Right   . RIGHT/LEFT HEART CATH AND CORONARY ANGIOGRAPHY N/A 09/29/2017   Procedure: RIGHT/LEFT HEART CATH AND CORONARY ANGIOGRAPHY;  Surgeon: Yolonda Kida, MD;  Location: [place] CV LAB;  Service: Cardiovascular;  Laterality: N/A;    Prior to Admission medications   Medication Sig Start Date End Date Taking? Authorizing Provider  acetaminophen (TYLENOL) 325 MG tablet Take 2 tablets (650 mg total) by mouth every 6 (six) hours as needed for mild pain or headache. Patient taking differently: Take 500 mg by mouth every 6 (six) hours as needed for mild pain or headache.  08/19/17   Gladstone Lighter, MD  albuterol (PROVENTIL HFA;VENTOLIN HFA) 108 (90 Base) MCG/ACT inhaler Inhale 2 puffs into the lungs every 6 (six) hours as needed for wheezing or shortness of breath.    [provider]  aspirin EC 81 MG tablet Take 81 mg by mouth at bedtime.    [provider]  Blood Glucose Monitoring Suppl (FIFTY50 GLUCOSE METER 2.0) w/Device KIT Use as directed. Dx E11.22 ONE TOUCH 02/22/16   [provider]  carvedilol (COREG) 6.25 MG tablet Take 6.25 mg by mouth 2 (two) times daily with a meal.    [provider]  felodipine (PLENDIL) 5 MG 24 hr tablet Take 1 tablet (5 mg total) by mouth every morning. Patient taking differently: Take 10 mg by mouth daily.  08/19/17   Gladstone Lighter, MD  furosemide (LASIX) 40 MG tablet Take 1 tablet (40 mg total) by mouth daily. 08/19/17   Gladstone Lighter, MD  glimepiride (AMARYL) 2 MG tablet Take 2 mg by mouth daily with breakfast.    [provider]  ipratropium-albuterol (DUONEB) 0.5-2.5 (3) MG/3ML SOLN Take 3 mLs by nebulization every 6 (six) hours as needed (wheezing, shortness of breath). Patient taking differently: Take 3 mLs by  nebulization 3 (three) times daily.  08/19/17   Gladstone Lighter, MD  ondansetron (ZOFRAN ODT) 4 MG disintegrating tablet Allow 1-2 tablets to dissolve in your mouth every 8 hours as needed for nausea/vomiting 01/25/18   Vevelyn Francois, NP  ONE TOUCH ULTRA TEST test strip USE TO CHECK BLOOD SUGAR 1-2 TIMES DAILY AS DIRECTED. DX E11.22. 02/22/16   [provider]  oxyCODONE (OXY IR/ROXICODONE) 5 MG immediate release tablet Take 0.5 tablets (2.5 mg total) by mouth every 4 (four) hours as needed for severe pain. 02/24/18 03/26/18  Vevelyn Francois, NP  potassium chloride 20 MEQ TBCR Take 20 mEq by mouth daily. While taking lasix 08/19/17   Gladstone Lighter, MD  sitaGLIPtin (JANUVIA) 100 MG tablet Take 100 mg by mouth daily.    [provider]  umeclidinium-vilanterol (ANORO ELLIPTA) 62.5-25 MCG/INH AEPB Inhale 1 puff into the lungs daily.  07/14/17   [provider]  vitamin B-12 (CYANOCOBALAMIN) 1000 MCG tablet Take 1 tablet (1,000 mcg total) by mouth daily. 02/24/18   Vevelyn Francois, NP    Allergies Codeine; Dilaudid [hydromorphone hcl]; Tramadol; Gabapentin; Hydrocodone-acetaminophen; Oxycodone-acetaminophen; Promethazine; and [place]all[place] [shellfish allergy]  Family History  Problem Relation Age of Onset  . Coronary artery disease Mother 36  . Coronary artery disease Brother 43  . Heart  attack Brother        MI  . Heart disease Father   . Diabetes Father   . Diabetes Other   . Hypertension Other   . Colon cancer Other   . Breast cancer Neg Hx     Social History Social History   Tobacco Use  . Smoking status: Former Smoker    Packs/day: 1.00    Years: 20.00    Pack years: 20.00    Types: Cigarettes    Last attempt to quit: 01/21/2004    Years since quitting: 14.1  . Smokeless tobacco: Never Used  Substance Use Topics  . Alcohol use: Yes    Alcohol/week: 0.0 - 1.0 standard drinks    Comment: Social  . Drug use: No    Review of Systems  Constitutional: No  fever/chills Eyes: No visual changes. ENT: No sore throat. Cardiovascular: Denies chest pain. Respiratory: Positive for shortness of breath. Gastrointestinal: No abdominal pain.  No nausea, no vomiting.  No diarrhea.  No constipation. Genitourinary: Negative for dysuria. Musculoskeletal: Negative for back pain. Skin: Negative for rash. Neurological: Negative for headaches, focal weakness or numbness.   ____________________________________________   PHYSICAL EXAM:  VITAL SIGNS: ED Triage Vitals  Enc Vitals Group     BP 03/26/18 0343 (!) 124/107     Pulse Rate 03-26-18 0343 (!) 108     Resp 2018/03/26 0343 20     Temp --      Temp src --      SpO2 26-Mar-2018 0343 100 %     Weight 2018-03-26 0331 200 lb 9.9 oz (91 kg)     Height 03-26-18 0331 _0  (1.651 m)     Head Circumference --      Peak Flow --      Pain Score 26-Mar-2018 0331 Asleep     Pain Loc --      Pain Edu? --      Excl. in [place]? --     Constitutional: Unresponsive. Eyes: Conjunctivae are normal. PERRL. EOMI. Head: Atraumatic. Nose: No congestion/rhinnorhea. Mouth/Throat: Mucous membranes are moist.  Oropharynx non-erythematous. Neck: No stridor.  Cardiovascular: Normal rate, regular rhythm. Grossly normal heart sounds.  Good peripheral circulation. Respiratory: No respiratory effort. Gastrointestinal: Soft and nontender. Moderate distention. No abdominal bruits. No CVA tenderness. Musculoskeletal: No lower extremity edema.  No joint effusions. Neurologic:  Unresponsive. Skin:  Skin is cool, dry and intact. No rash noted. Psychiatric: Unable to assess.  ____________________________________________   LABS (all labs ordered are listed, but only abnormal results are displayed)  Labs Reviewed  GLUCOSE, CAPILLARY - Abnormal; Notable for the following components:      Result Value   Glucose-Capillary 333 (*)    All other components within normal limits  CBC WITH DIFFERENTIAL/PLATELET - Abnormal; Notable for the  following components:   WBC 16.9 (*)    RBC 5.70 (*)    Hemoglobin 15.3 (*)    HCT 49.9 (*)    RDW 15.8 (*)    Neutro Abs 11.5 (*)    Abs Immature Granulocytes 0.39 (*)    All other components within normal limits  COMPREHENSIVE METABOLIC PANEL - Abnormal; Notable for the following components:   Glucose, Bld 362 (*)    Creatinine, Ser 1.13 (*)    Calcium 8.6 (*)    GFR calc non Af Amer 50 (*)    GFR calc Af Amer 58 (*)    All other components within normal limits  LACTIC ACID, PLASMA -  Abnormal; Notable for the following components:   Lactic Acid, Venous 8.0 (*)    All other components within normal limits  URINALYSIS, COMPLETE (UACMP) WITH MICROSCOPIC - Abnormal; Notable for the following components:   Color, Urine YELLOW (*)    APPearance HAZY (*)    Glucose, UA >=500 (*)    Protein, ur >=300 (*)    Bacteria, UA RARE (*)    All other components within normal limits  BLOOD GAS, ARTERIAL - Abnormal; Notable for the following components:   pH, Arterial 7.16 (*)    pCO2 arterial 60 (*)    pO2, Arterial 153 (*)    Acid-base deficit 8.0 (*)    All other components within normal limits  CULTURE, BLOOD (ROUTINE X 2)  CULTURE, BLOOD (ROUTINE X 2)  URINE CULTURE  CULTURE, RESPIRATORY  MRSA PCR SCREENING  TROPONIN I  LACTIC ACID, PLASMA  TSH  PROCALCITONIN  BLOOD GAS, ARTERIAL  URINE DRUG SCREEN, QUALITATIVE ([facility] ONLY)  TROPONIN I  TROPONIN I  TROPONIN I  BASIC METABOLIC PANEL   ____________________________________________  EKG  ED ECG REPORT I, SUNG,JADE J, the attending physician, personally viewed and interpreted this ECG.   Date: 03-Apr-2018  EKG Time: 0357  Rate: 97  Rhythm: normal EKG, normal sinus rhythm  Axis: Normal  Intervals:none  ST&T Change: Nonspecific  ____________________________________________  RADIOLOGY  ED MD interpretation: ET tube too close to carina; will retract   Official radiology report(s): Dg Abdomen 1 View  Result Date:  April 03, 2018 CLINICAL DATA:  68 year old female with enteric tube placement. EXAM: ABDOMEN - 1 VIEW COMPARISON:  Abdominal CT dated 12/01/2014. FINDINGS: Partially visualized enteric tube with tip and side-port in the left upper abdomen likely in the stomach. Air is noted within the colon. There is degenerative changes of the spine. The soft tissues are grossly unremarkable. Bibasilar interstitial coarsening. IMPRESSION: Enteric tube with tip in the left upper abdomen likely in the stomach. Electronically Signed   By: Anner Crete M.D.   On: 04-03-2018 05:33   Dg Chest Port 1 View  Result Date: 2018/04/03 CLINICAL DATA:  68 year old female with respiratory failure. Status post intubation. EXAM: PORTABLE CHEST 1 VIEW COMPARISON:  Chest CT dated 08/13/2017 FINDINGS: Endotracheal tube with tip close to the carina tilting towards the left mainstem bronchus. Recommend retraction by approximately 3 cm. There is cardiomegaly. Diffuse interstitial coarsening and patchy bilateral lower lung field densities which may represent combination of atelectasis or pneumonia on a background of interstitial lung disease. No large pleural effusion. No pneumothorax. No acute osseous pathology. IMPRESSION: 1. Endotracheal tube with tip close to the carina. Recommend retraction by approximately 3 cm. 2. Cardiomegaly. 3. Bilateral lower lung field airspace densities. Electronically Signed   By: Anner Crete M.D.   On: 04/03/2018 04:43    ____________________________________________   PROCEDURES  Procedure(s) performed (including Critical Care):  Procedure Name: Intubation Date/Time: April 03, 2018 3:49 AM Performed by: Paulette Blanch, MD Pre-anesthesia Checklist: Patient identified, Patient being monitored, Emergency Drugs available, Timeout performed and Suction available Oxygen Delivery Method: Ambu bag Preoxygenation: Pre-oxygenation with 100% oxygen Induction Type: Rapid sequence and Cricoid Pressure  applied Ventilation: Mask ventilation without difficulty Laryngoscope Size: Glidescope and 3 Grade View: Grade I Tube size: 7.5 mm Number of attempts: 1 Airway Equipment and Method: Rigid stylet Placement Confirmation: ETT inserted through vocal cords under direct vision,  CO2 detector and Breath sounds checked- equal and bilateral Tube secured with: ETT holder       CRITICAL CARE  Performed by: Paulette Blanch   Total critical care time: 60 minutes  Critical care time was exclusive of separately billable procedures and treating other patients.  Critical care was necessary to treat or prevent imminent or life-threatening deterioration.  Critical care was time spent personally by me on the following activities: development of treatment plan with patient and/or surrogate as well as nursing, discussions with consultants, evaluation of patient's response to treatment, examination of patient, obtaining history from patient or surrogate, ordering and performing treatments and interventions, ordering and review of laboratory studies, ordering and review of radiographic studies, pulse oximetry and re-evaluation of patient's condition.    ____________________________________________   INITIAL IMPRESSION / ASSESSMENT AND PLAN / ED COURSE  As part of my medical decision making, I reviewed the following data within the [place] History obtained from family, Nursing notes reviewed and incorporated, Labs reviewed, EKG interpreted, Old chart reviewed, Radiograph reviewed, Discussed with admitting physician and Notes from prior ED visits    68 year old female with COPD who arrives in full respiratory arrest.  She was intubated without difficulty.  Subsequently she lost pulses.  CPR was performed.  2 rounds of epi, 1 amp of bicarb were given with return of spontaneous circulation.  Patient husband is at bedside.  States she was asking for her oxygen tank to be turned up earlier this  evening.   Clinical Course as of Mar 16 615  April 03, 2018  0400 Patient remains with spontaneous pulses. BP on 62mg Levophed 115/83. Will hold to see how her BP holds up. Will restart immediately as needed. Discussed with hospitalist Dr. DMarcille Blancowho will evaluate patient in the emergency department for admission.   [JS]    Clinical Course User Index [JS] SPaulette Blanch MD     ____________________________________________   FINAL CLINICAL IMPRESSION(S) / ED DIAGNOSES  Final diagnoses:  Respiratory arrest ([place]  Hypothermia, initial encounter  Hyperglycemia     ED Discharge Orders    None       Note:  This document was prepared using Dragon voice recognition software and may include unintentional dictation errors.   SPaulette Blanch MD [phone]

## 2018-03-21 NOTE — Progress Notes (Signed)
PATIENT NAME: PEACHIE BARKALOW MEDICAL RECORD NUMBER: [id number] Birthday: 1951-01-05  Age: 68 y.o. Admit Date: 04/04/2018  Provider:Matti Minney williams  Indication: cardiac arrest  Technical Description:   CPR performance duration: 1 minutes     Evaluation  Final Status - Was patient successfully resuscitated-YES  If successfully resuscitated - what is current rhythm-SR If successfully resuscitated - what is current hemodynamic status-unstable  Husband at bedside updated and notified  Patient critically ill [place] prognosis    Dalilah Curlin Patricia Pesa, M.D.  [facility]  Medical Director [place] Director [facility] Cardio-Pulmonary Department

## 2018-03-21 NOTE — Procedures (Signed)
Central Venous Catheter Insertion Procedure Note JASMYN PICHA [id number] Aug 25, 1950  Procedure: Insertion of Central Venous Catheter Indications: Assessment of intravascular volume, Drug and/or fluid administration and Frequent blood sampling  Procedure Details Consent: Unable to obtain consent because of emergent medical necessity. and Altered Mental Status (intubated and sedated) Time Out: Verified patient identification, verified procedure, site/side was marked, verified correct patient position, special equipment/implants available, medications/allergies/relevent history reviewed, required imaging and test results available.  Performed  Maximum sterile technique was used including antiseptics, cap, gloves, gown, hand hygiene, mask and sheet. Skin prep: Chlorhexidine; local anesthetic administered A antimicrobial bonded/coated triple lumen catheter was placed in the right internal jugular vein using the Seldinger technique.  Evaluation Blood flow good Complications: No apparent complications Patient did tolerate procedure well. Chest X-ray ordered to verify placement.  CXR: pending.  Procedure was performed using Ultrasound for real visualization of vessel cannulization.  Darel Hong, AGACNP-BC [facility] Pager: [phone] Cell: [phone]   Bradly Bienenstock 03/26/2018, 7:44 AM

## 2018-03-21 NOTE — Progress Notes (Addendum)
04/13/2018 0332  Clinical Encounter Type  Visited With Family;Health care provider  Visit Type Code  Spiritual Encounters  Spiritual Needs Prayer;Emotional  Stress Factors  Family Stress Factors Health changes  Advance Directives (For Healthcare)  Does Patient Have a Medical Advance Directive? No  Mental Health Advance Directives  Does Patient Have a Mental Health Advance Directive? No   Chaplain received a page regarding CPR in progress for the patient. Her husband was intermittently at the bedside and waiting outside of the room. He made calls to his sister-in-law and niece to provide an update on the his wife's status. Chaplain maintained pastoral presence and offered compassionate care and emotional support. Patient pulses obtained and is now awaiting transfer to the CCU. The patient's husband asked the chaplain to pray at the bedside; chaplain obliged. He expressed gratitude and shared the events that led up to the need to call EMS. Chaplain will wait with the patient's husband, then check back once she has been transferred to the CCU.

## 2018-03-21 NOTE — Progress Notes (Signed)
eLink Physician-Brief Progress Note Patient Name: Stephanie Short DOB: 03-Oct-1950 MRN: [id number]   Date of Service  03-Apr-2018  HPI/Events of Note  68 yo female with PMH of COPD and Pulmonary HTN (on study drug at [facility] for same). C/o SOB and placed on CPAP by EMS. Arrived in ED pulseless. CPR intiated. Patient intubated and ventilated. PCCM asked to assume care in ICU. VSS.  eICU Interventions  No new orders.     Intervention Category Evaluation Type: New Patient Evaluation  [name] 04-03-2018, 6:18 AM

## 2018-03-21 NOTE — Progress Notes (Signed)
Pastoral Care Visit - Code [facility]    04-01-2018 0900  Clinical Encounter Type  Visited With Patient and family together;Health care provider  Visit Type Code  Referral From Physician  Consult/Referral To Chaplain  Spiritual Encounters  Spiritual Needs Emotional;Grief support  Stress Factors  Family Stress Factors Health changes   Chap responded to code blue. Pt's husband is present. Comforted pt husband.  Husband shared about pt and their children. Chap provided grief support.  Darcey Nora, Chaplain

## 2018-03-21 NOTE — ED Notes (Signed)
Dr Beather Arbour made aware at this time of pt's critical Lactic Acid level as reported by lab. Lactic Acid 8.0 mmol/L

## 2018-03-21 NOTE — ED Notes (Signed)
2nd dose of Epi given at this time (57m/10mL) IVP

## 2018-03-21 NOTE — ED Notes (Signed)
Pt without detectable breathing or pulses; CPR initiated at this time.

## 2018-03-21 NOTE — Progress Notes (Signed)
I have dicussed with husband of grave prognosis with multiorgan failure  Patient had recurrent cardiac arrest, sedation and hypotyermia protocoll stopped  Husband at bedside for a 4th code bluee and he asked to NOT to start CPR.  Patient was pronounced dead at 62AM.   Family are satisfied with Plan of action and management. All questions answered  Corrin Parker, M.D.  [facility]  Medical Director [place] Director [facility] Cardio-Pulmonary Department

## 2018-03-21 NOTE — Progress Notes (Addendum)
Pt coded x 3 after arrival of writing RN for this shift on ICU.  Each time was PEA noted when O2 sats flatlined and patient's clonic jerking ceased suddenly.  After the second code blue with CPR and epi pushed husband stated "Please don't do that to her again.  She can't take it anymore" when I told him his wife was pulseless again and we would have to start CPR.  MD Kasa notified and he spoke with husband and patient was made DNR/NMP at that time.  Writing RN stayed at bedside and answered questions for husband.  Chaplain also at bedside.  Pt taking approx 2 agonal breaths /minute over the vent during this time and clonic activity increased.  This activity ceased suddenly and the agonal breaths stopped also. EKG activity continued on monitor for several minutes after all pulses were lost and no heart tones could be auscultated.  I told Mr Chenevert what this meant and he was accepting that his wife was no longer suffering.  He has called the family in and stated they should arrive soon.  Dr Mortimer Fries called to bedside at husband's request and confirmed no pupillary reflexes and no heart tones,  Patient pronounced dead at 1047  Pt bathed, linen changed and all lines discontinued.  Room cleaned.  CDS has released the patient, not a suitable donor.

## 2018-03-21 NOTE — ED Triage Notes (Signed)
Pt arrives to ED via [facility] from home with c/o [facility]. Pt unresponsive on arrival. EMS reports pt was alert and talking en route, became anxious and frigidity en route and attempted to removed C-pap that was placed by EMS. Pt immediately intubated by Dr Beather Arbour upon room to ED 16.

## 2018-03-21 NOTE — Death Summary Note (Signed)
DEATH SUMMARY   Patient Details  Name: Stephanie Short MRN: [id number] DOB: 06/23/1950  Admission/Discharge Information   Admit Date:  2018-03-30  Date of Death:   03-30-18   Time of Death:  1047AM  Length of Stay: 0  Referring Physician: Kirk Ruths, MD   Reason(s) for Hospitalization  Cardiac arrest  Diagnoses  Preliminary cause of death: ischemic cardiomyopathy COPD PULM HTN Secondary Diagnoses (including complications and co-morbidities):  Active Problems:   Acute respiratory failure with hypoxemia [facility])   Brief Hospital Course (including significant findings, care, treatment, and services provided and events leading to death)  Cardiac arrest NSTEMI VENT SUPPORT VASOPRESSOR SUPPORT  Husband at bedside Patient was made DNR aftre 4 codes  Patient was then taken off all meds Patient expired at 1047AM while husband at bedside    Pertinent Labs and Studies  Significant Diagnostic Studies Dg Abdomen 1 View  Result Date: 03-30-2018 CLINICAL DATA:  68 year old female with enteric tube placement. EXAM: ABDOMEN - 1 VIEW COMPARISON:  Abdominal CT dated 12/01/2014. FINDINGS: Partially visualized enteric tube with tip and side-port in the left upper abdomen likely in the stomach. Air is noted within the colon. There is degenerative changes of the spine. The soft tissues are grossly unremarkable. Bibasilar interstitial coarsening. IMPRESSION: Enteric tube with tip in the left upper abdomen likely in the stomach. Electronically Signed   By: Anner Crete M.D.   On: 03/30/2018 05:33   Dg Chest Port 1 View  Result Date: 03-30-18 CLINICAL DATA:  68 year old female post central line placement. Subsequent encounter. EXAM: PORTABLE CHEST 1 VIEW COMPARISON:  03-30-2018 3:27 a.m. FINDINGS: Endotracheal tube has been retracted with the tip now 4.4 cm above the carina. Right internal jugular catheter has been placed with the tip projecting over the mid superior vena  cava level. No pneumothorax noted. Nasogastric tube courses below the diaphragm. Tip is not included on the present exam. Cardiomegaly. Progressive slightly asymmetric airspace disease greater on the right and greater within the lung bases. This may represent pulmonary edema. Infectious infiltrate not excluded in proper clinical setting. Calcified aorta. No acute osseous abnormality. IMPRESSION: 1. Endotracheal tube has been retracted with the tip now 4.4 cm above the carina. 2. Right internal jugular catheter has been placed with the tip projecting over the mid superior vena cava level. No pneumothorax noted. 3. Nasogastric tube courses below the diaphragm. Tip is not included on the present exam. 4. Cardiomegaly. 5. Progressive slightly asymmetric airspace disease greater on the right and greater within the lung bases. This may represent pulmonary edema. Infectious infiltrate not excluded in proper clinical setting. 6.  Aortic Atherosclerosis (ICD10-I70.0). Electronically Signed   By: Genia Del M.D.   On: 2018/03/30 08:14   Dg Chest Port 1 View  Result Date: 2018-03-30 CLINICAL DATA:  68 year old female with respiratory failure. Status post intubation. EXAM: PORTABLE CHEST 1 VIEW COMPARISON:  Chest CT dated 08/13/2017 FINDINGS: Endotracheal tube with tip close to the carina tilting towards the left mainstem bronchus. Recommend retraction by approximately 3 cm. There is cardiomegaly. Diffuse interstitial coarsening and patchy bilateral lower lung field densities which may represent combination of atelectasis or pneumonia on a background of interstitial lung disease. No large pleural effusion. No pneumothorax. No acute osseous pathology. IMPRESSION: 1. Endotracheal tube with tip close to the carina. Recommend retraction by approximately 3 cm. 2. Cardiomegaly. 3. Bilateral lower lung field airspace densities. Electronically Signed   By: Anner Crete M.D.   On: March 30, 2018 04:43  Microbiology Recent  Results (from the past 240 hour(s))  MRSA PCR Screening     Status: None   Collection Time: 03/21/18  6:13 AM  Result Value Ref Range Status   MRSA by PCR NEGATIVE NEGATIVE Final    Comment:        The GeneXpert MRSA Assay (FDA approved for NASAL specimens only), is one component of a comprehensive MRSA colonization surveillance program. It is not intended to diagnose MRSA infection nor to guide or monitor treatment for MRSA infections. Performed at [facility], [address] [place], [place] [address]     Lab Basic Metabolic Panel: Recent Labs  Lab 2018/03/21 0334 March 21, 2018 0658  NA 140 142  K 3.6 4.1  CL 105 106  CO2 25 23  GLUCOSE 362* 402*  BUN 17 21  CREATININE 1.13* 1.31*  CALCIUM 8.6* 9.1   Liver Function Tests: Recent Labs  Lab 03-21-2018 0334  AST 25  ALT 15  ALKPHOS 98  BILITOT 0.5  PROT 7.7  ALBUMIN 3.9   No results for input(s): LIPASE, AMYLASE in the last 168 hours. No results for input(s): AMMONIA in the last 168 hours. CBC: Recent Labs  Lab 03-21-18 0334  WBC 16.9*  NEUTROABS 11.5*  HGB 15.3*  HCT 49.9*  MCV 87.5  PLT 230   Cardiac Enzymes: Recent Labs  Lab 03-21-18 0334 21-Mar-2018 0658  TROPONINI <0.03 3.37*   Sepsis Labs: Recent Labs  Lab 03-21-2018 0334 21-Mar-2018 0347 Mar 21, 2018 0646 03-21-2018 0658  PROCALCITON  --   --   --  1.67  WBC 16.9*  --   --   --   LATICACIDVEN  --  8.0* 3.7*  --       Alixandria Friedt 2018/03/21, 10:58 AM

## 2018-03-21 NOTE — ED Notes (Signed)
CPR paused for pulse/rhythm check at this time. Pt with audible pulses via Doppler (HR 151), CPR discontinued by Dr Beather Arbour.

## 2018-03-21 NOTE — Progress Notes (Signed)
Pt noted to be in cardiac arrest upon arrival to ED.  Code in ED approximately 10 minutes long before ROSC achieved.  It is reported that pt became unresponsive with EMS, unsure if she had a pulse at that time, so unsure of total pulseless time.  Upon arrival to ICU, pt is intubated with myoclonic jerking.  Pupils are PERRL, but pt has no cough or gag reflex.  Placed order for STAT Head CT and CTA Chest to rule out PE.  However, pt is too unstable to transfer down to CT right now (O2 sats 80-84% on 100% FiO2 and 10 PEEP).  Discussed with Dr. Emmit Alexanders pt findings, and inquired if we should perform Targeted Temperature Management.  After discussing with Dr. Emmit Alexanders, will cool pt to 36 C.    Darel Hong, AGACNP-BC [facility] Pager: [phone] Cell: [phone]

## 2018-03-21 NOTE — Progress Notes (Signed)
Responded to Code Blue. Upon arrival, was instructed by nursing staff that patient has central venous access. No Code Blue sheet started at that time. Instructed staff to consult if our services are needed.

## 2018-03-21 NOTE — ED Notes (Signed)
CPR paused for pulse/rhythm check. No pulses detected, CPR resumed at this time.

## 2018-03-21 NOTE — Consult Note (Signed)
[facility] Consultation Note  Patient ID: Stephanie Short, MRN: [id number], DOB/AGE: 1950/11/01 68 y.o. Admit date: 04/08/18   Date of Consult: 04-08-2018 Primary Physician: Kirk Ruths, MD Primary Cardiologist: [facility]  Chief Complaint:  Chief Complaint  Patient presents with  . Shortness of Breath   Reason for Consult: Cardiopulmonary arrest  HPI: 68 y.o. female with known cardiovascular disease essential hypertension mixed hyperlipidemia and diabetes who is had severe chronic obstructive pulmonary disease with home oxygen.  The patient has had pulmonary rehabilitation over the last 6 to 8 months for which she is done fairly well and started on a new research medication management for pulmonary hypertension.  She has been doing relatively well with this until last night when she became severely short of breath in a sudden fashion.  The severe shortness of breath did not improve with increased oxygenation.  She also had feeling of chest pressure and burning as well.  At that time there was apparently no other significant symptoms of dizziness syncope nausea or diaphoresis.  Her husband did increase her oxygen for which did not make her feel any better.  Apparent oxygenation was in the 70% range and sometimes into the 50% range by pulse oximeter at home.  EMS was called and the patient was taken to the emergency room at which time apparently she had pulseless electrical activity and received a fairly long time of CPR.  After intubation and further oxygenation the patient therefore had a blood pressure and heart rate and there were no apparent significant EKG changes.  Currently EKG has shown sinus tachycardia with biatrial enlargement and right axis deviation otherwise no apparent ST elevation myocardial infarction.  Current troponin is normal.  The patient is currently hemodynamically stable but has been sedated intubated without evidence of neurologic recovery at this  time.  Currently the patient has had a differential diagnosis of coronary artery disease as a primary issue although cardiac catheterization showed modest coronary atherosclerosis and no atherosclerosis of the right coronary artery.  Additionally pulmonary embolism and other pulmonary causes may be paramount.  Past Medical History:  Diagnosis Date  . Asthma   . CHF (congestive heart failure) ([place])   . Depression   . Diabetes mellitus   . Hepatitis C    Dr. 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 Hypertension   . Insomnia   . Oxygen dependent       Surgical History:  Past Surgical History:  Procedure Laterality Date  . BACK SURGERY  2013   [facility] Dr. Mauri Pole  . COLONOSCOPY W/ POLYPECTOMY    . COLONOSCOPY WITH PROPOFOL N/A 03/13/2015   Procedure: COLONOSCOPY WITH PROPOFOL;  Surgeon: Lollie Sails, MD;  Location: [facility] ENDOSCOPY;  Service: Endoscopy;  Laterality: N/A;  . HYSTEROSCOPY W/D&C N/A 08/18/2014   Procedure: DILATATION AND CURETTAGE /HYSTEROSCOPY;  Surgeon: Lorette Ang, MD;  Location: [facility] ORS;  Service: Gynecology;  Laterality: N/A;  . KNEE ARTHROSCOPY Right   . RIGHT/LEFT HEART CATH AND CORONARY ANGIOGRAPHY N/A 09/29/2017   Procedure: RIGHT/LEFT HEART CATH AND CORONARY ANGIOGRAPHY;  Surgeon: Yolonda Kida, MD;  Location: [facility] LAB;  Service: Cardiovascular;  Laterality: N/A;     Home Meds: Prior to Admission medications   Medication Sig Start Date End Date Taking? Authorizing Provider  acetaminophen (TYLENOL) 325 MG tablet Take 2 tablets (650 mg total) by mouth every 6 (six) hours as needed for mild pain or headache. Patient taking differently:  Take 500 mg by mouth every 6 (six) hours as needed for mild pain or headache.  08/19/17   Gladstone Lighter, MD  albuterol (PROVENTIL HFA;VENTOLIN HFA) 108 (90 Base) MCG/ACT inhaler Inhale 2 puffs into the lungs every 6 (six) hours as needed for wheezing or shortness of breath.    [provider]   aspirin EC 81 MG tablet Take 81 mg by mouth at bedtime.    [provider]  Blood Glucose Monitoring Suppl (FIFTY50 GLUCOSE METER 2.0) w/Device KIT Use as directed. Dx E11.22 ONE TOUCH 02/22/16   [provider]  carvedilol (COREG) 6.25 MG tablet Take 6.25 mg by mouth 2 (two) times daily with a meal.    [provider]  felodipine (PLENDIL) 5 MG 24 hr tablet Take 1 tablet (5 mg total) by mouth every morning. Patient taking differently: Take 10 mg by mouth daily.  08/19/17   Gladstone Lighter, MD  furosemide (LASIX) 40 MG tablet Take 1 tablet (40 mg total) by mouth daily. 08/19/17   Gladstone Lighter, MD  glimepiride (AMARYL) 2 MG tablet Take 2 mg by mouth daily with breakfast.    [provider]  ipratropium-albuterol (DUONEB) 0.5-2.5 (3) MG/3ML SOLN Take 3 mLs by nebulization every 6 (six) hours as needed (wheezing, shortness of breath). Patient taking differently: Take 3 mLs by nebulization 3 (three) times daily.  08/19/17   Gladstone Lighter, MD  ondansetron (ZOFRAN ODT) 4 MG disintegrating tablet Allow 1-2 tablets to dissolve in your mouth every 8 hours as needed for nausea/vomiting 01/25/18   Vevelyn Francois, NP  ONE TOUCH ULTRA TEST test strip USE TO CHECK BLOOD SUGAR 1-2 TIMES DAILY AS DIRECTED. DX E11.22. 02/22/16   [provider]  oxyCODONE (OXY IR/ROXICODONE) 5 MG immediate release tablet Take 0.5 tablets (2.5 mg total) by mouth every 4 (four) hours as needed for severe pain. 02/24/18 03/26/18  Vevelyn Francois, NP  potassium chloride 20 MEQ TBCR Take 20 mEq by mouth daily. While taking lasix 08/19/17   Gladstone Lighter, MD  sitaGLIPtin (JANUVIA) 100 MG tablet Take 100 mg by mouth daily.    [provider]  umeclidinium-vilanterol (ANORO ELLIPTA) 62.5-25 MCG/INH AEPB Inhale 1 puff into the lungs daily.  07/14/17   [provider]  vitamin B-12 (CYANOCOBALAMIN) 1000 MCG tablet Take 1 tablet (1,000 mcg total) by mouth daily. 02/24/18   Vevelyn Francois, NP    Inpatient Medications:  . artificial tears  1 application Both Eyes C0B  . aspirin  81 mg Per Tube Daily  . chlorhexidine gluconate (MEDLINE KIT)  15 mL Mouth Rinse BID  . cisatracurium  0.1 mg/kg Intravenous Once  . fentaNYL (SUBLIMAZE) injection  100 mcg Intravenous Once  . heparin  5,000 Units Subcutaneous Q8H  . insulin aspart  0-15 Units Subcutaneous Q4H  . insulin aspart  0-5 Units Subcutaneous QHS  . ipratropium-albuterol  3 mL Nebulization Q6H  . mouth rinse  15 mL Mouth Rinse 10 times per day   . sodium chloride 125 mL/hr at 11-Apr-2018 0836  . cisatracurium (NIMBEX) infusion 1 mcg/kg/min (04-11-18 0845)  . famotidine (PEPCID) IV 20 mg (2018/04/11 0839)  . fentaNYL infusion INTRAVENOUS 100 mcg/hr (04-11-18 0834)  . norepinephrine (LEVOPHED) Adult infusion Stopped (Apr 11, 2018 0359)  . norepinephrine (LEVOPHED) Adult infusion    . piperacillin-tazobactam (ZOSYN)  IV Stopped (April 11, 2018 0901)  . propofol (DIPRIVAN) infusion 20 mcg/kg/min (2018-04-11 0700)  . propofol (DIPRIVAN) infusion    . vancomycin  Allergies:  Allergies  Allergen Reactions  . Codeine Nausea And Vomiting  . Dilaudid [Hydromorphone Hcl] Nausea And Vomiting  . Tramadol Nausea And Vomiting and Nausea Only  . Gabapentin Nausea And Vomiting and Other (See Comments)    Dizziness and GI upset  . Hydrocodone-Acetaminophen Nausea And Vomiting and Nausea Only  . Oxycodone-Acetaminophen Nausea Only  . Promethazine Other (See Comments)    Reaction:  GI upset   . Scallops [Shellfish Allergy] Nausea And Vomiting    Social History   Socioeconomic History  . Marital status: Married    Spouse name: Not on file  . Number of children: Not on file  . Years of education: Not on file  . Highest education level: Not on file  Occupational History  . Occupation: Full time  Social Needs  . Financial resource strain: Not on file  . Food insecurity:    Worry: Not on file    Inability: Not on file  .  Transportation needs:    Medical: Not on file    Non-medical: Not on file  Tobacco Use  . Smoking status: Former Smoker    Packs/day: 1.00    Years: 20.00    Pack years: 20.00    Types: Cigarettes    Last attempt to quit: 01/21/2004    Years since quitting: 14.1  . Smokeless tobacco: Never Used  Substance and Sexual Activity  . Alcohol use: Yes    Alcohol/week: 0.0 - 1.0 standard drinks    Comment: Social  . Drug use: No  . Sexual activity: Not on file  Lifestyle  . Physical activity:    Days per week: Not on file    Minutes per session: Not on file  . Stress: Not on file  Relationships  . Social connections:    Talks on phone: Not on file    Gets together: Not on file    Attends religious service: Not on file    Active member of club or organization: Not on file    Attends meetings of clubs or organizations: Not on file    Relationship status: Not on file  . Intimate partner violence:    Fear of current or ex partner: Not on file    Emotionally abused: Not on file    Physically abused: Not on file    Forced sexual activity: Not on file  Other Topics Concern  . Not on file  Social History Narrative   Does not get regular exercise     Family History  Problem Relation Age of Onset  . Coronary artery disease Mother 32  . Coronary artery disease Brother 31  . Heart attack Brother        MI  . Heart disease Father   . Diabetes Father   . Diabetes Other   . Hypertension Other   . Colon cancer Other   . Breast cancer Neg Hx      Review of Systems    Labs: Recent Labs    Mar 22, 2018 0334  TROPONINI <0.03   Lab Results  Component Value Date   WBC 16.9 (H) 03-22-18   HGB 15.3 (H) 03-22-2018   HCT 49.9 (H) 2018/03/22   MCV 87.5 03/22/18   PLT 230 03-22-18    Recent Labs  Lab 03/22/2018 0334  NA 140  K 3.6  CL 105  CO2 25  BUN 17  CREATININE 1.13*  CALCIUM 8.6*  PROT 7.7  BILITOT 0.5  ALKPHOS 98  ALT 15  AST 25  GLUCOSE 362*   Lab Results   Component Value Date   CHOL 82 05/24/2012   HDL 31 (L) 05/24/2012   LDLCALC 38 05/24/2012   TRIG 67 05/24/2012   Lab Results  Component Value Date   DDIMER  02/06/2009    0.30        AT THE INHOUSE ESTABLISHED CUTOFF VALUE OF 0.48 ug/mL FEU, THIS ASSAY HAS BEEN DOCUMENTED IN THE LITERATURE TO HAVE A SENSITIVITY AND NEGATIVE PREDICTIVE VALUE OF AT LEAST 98 TO 99%.  THE TEST RESULT SHOULD BE CORRELATED WITH AN ASSESSMENT OF THE CLINICAL PROBABILITY OF DVT / VTE.    Radiology/Studies:  Dg Abdomen 1 View  Result Date: 03-20-18 CLINICAL DATA:  68 year old female with enteric tube placement. EXAM: ABDOMEN - 1 VIEW COMPARISON:  Abdominal CT dated 12/01/2014. FINDINGS: Partially visualized enteric tube with tip and side-port in the left upper abdomen likely in the stomach. Air is noted within the colon. There is degenerative changes of the spine. The soft tissues are grossly unremarkable. Bibasilar interstitial coarsening. IMPRESSION: Enteric tube with tip in the left upper abdomen likely in the stomach. Electronically Signed   By: Anner Crete M.D.   On: 2018-03-20 05:33   Dg Chest Port 1 View  Result Date: 03/20/2018 CLINICAL DATA:  67 year old female post central line placement. Subsequent encounter. EXAM: PORTABLE CHEST 1 VIEW COMPARISON:  Mar 20, 2018 3:27 a.m. FINDINGS: Endotracheal tube has been retracted with the tip now 4.4 cm above the carina. Right internal jugular catheter has been placed with the tip projecting over the mid superior vena cava level. No pneumothorax noted. Nasogastric tube courses below the diaphragm. Tip is not included on the present exam. Cardiomegaly. Progressive slightly asymmetric airspace disease greater on the right and greater within the lung bases. This may represent pulmonary edema. Infectious infiltrate not excluded in proper clinical setting. Calcified aorta. No acute osseous abnormality. IMPRESSION: 1. Endotracheal tube has been retracted with the  tip now 4.4 cm above the carina. 2. Right internal jugular catheter has been placed with the tip projecting over the mid superior vena cava level. No pneumothorax noted. 3. Nasogastric tube courses below the diaphragm. Tip is not included on the present exam. 4. Cardiomegaly. 5. Progressive slightly asymmetric airspace disease greater on the right and greater within the lung bases. This may represent pulmonary edema. Infectious infiltrate not excluded in proper clinical setting. 6.  Aortic Atherosclerosis (ICD10-I70.0). Electronically Signed   By: Genia Del M.D.   On: Mar 20, 2018 08:14   Dg Chest Port 1 View  Result Date: 20-Mar-2018 CLINICAL DATA:  68 year old female with respiratory failure. Status post intubation. EXAM: PORTABLE CHEST 1 VIEW COMPARISON:  Chest CT dated 08/13/2017 FINDINGS: Endotracheal tube with tip close to the carina tilting towards the left mainstem bronchus. Recommend retraction by approximately 3 cm. There is cardiomegaly. Diffuse interstitial coarsening and patchy bilateral lower lung field densities which may represent combination of atelectasis or pneumonia on a background of interstitial lung disease. No large pleural effusion. No pneumothorax. No acute osseous pathology. IMPRESSION: 1. Endotracheal tube with tip close to the carina. Recommend retraction by approximately 3 cm. 2. Cardiomegaly. 3. Bilateral lower lung field airspace densities. Electronically Signed   By: Anner Crete M.D.   On: 03-20-18 04:43    EKG: Sinus tachycardia with biatrial enlargement and right axis deviation  Weights: Filed Weights   03/20/18 0331  Weight: 91 kg     Physical Exam: Blood pressure 102/77, pulse 82, temperature (!)  96.6 F (35.9 C), resp. rate 18, height 5' 5" (1.651 m), weight 91 kg, SpO2 (!) 85 %. Body mass index is 33.38 kg/m. General: Well developed, well nourished, in no acute distress. Head eyes ears nose throat: Normocephalic, atraumatic, sclera non-icteric, no  xanthomas, nares are without discharge. No apparent thyromegaly and/or mass  Lungs: Normal respiratory effort.  Some wheezes, no rales, no rhonchi.  Heart: RRR with normal S1 S2. no murmur gallop, no rub, PMI is normal size and placement, carotid upstroke normal without bruit, jugular venous pressure is normal Abdomen: Soft, non-tender, non-distended with normoactive bowel sounds. No hepatomegaly. No rebound/guarding. No obvious abdominal masses. Abdominal aorta is normal size without bruit Extremities: No edema. no cyanosis, no clubbing, no ulcers  Peripheral : 2+ bilateral upper extremity pulses, 2+ bilateral femoral pulses, 2+ bilateral dorsal pedal pulse Neuro: Not alert and oriented. No facial asymmetry. No focal deficit.  Does not moves all extremities spontaneously. Musculoskeletal: Normal muscle tone without kyphosis Psych: Does not responds to questions appropriately with a normal affect.    Assessment: 68 year old female with acute cardiopulmonary arrest of unknown etiology with differential diagnosis of hypoxia, pulmonary embolism, and/or non-ST elevation myocardial infarction.  Currently the patient does not appear to have any ST elevation myocardial infarction or heart failure requiring further pursuit.  Plan: 1.  Continue supportive care for cardiopulmonary arrest with oxygenation and intubation 2.  Serial ECG and enzymes to assess for possible myocardial infarction 3.  Further evaluation of possibility of pulmonary embolism causing hypoxia and pulseless electrical activity 4.  Further consideration of echocardiogram in comparison to old echocardiograms 5.  No further invasive cardiac diagnostics at this time due to no evidence of ST elevation myocardial infarction and will continue further evaluation after above for next treatment options 6.  Heparinization if able for treatment of above as able  Signed, Corey Skains M.D. [facility] 2018-04-01, 9:05  AM

## 2018-03-21 NOTE — Progress Notes (Signed)
Pastoral Care Follow Up Visit w/ Pt Family    Apr 08, 2018 1300  Clinical Encounter Type  Visited With Patient and family together  Visit Type Follow-up  Referral From Chaplain;Family  Consult/Referral To Chaplain  Spiritual Encounters  Spiritual Needs Grief support;Emotional;Prayer   Once family to arrive, chap revisited fam to provide comfort and support.  Pt's husband, son, and aunt arrived.  Husband indicated that addt'l family will be arriving later to see body in the morgue.  Melven Sartorius confirmed information needed was given to RN by family and release ppwk signed.  Melven Sartorius gave husband AC's phone number and directed him to call as soon as decision made regarding to whom to release the body.  Darcey Nora, Chaplain

## 2018-03-21 DEATH — deceased

## 2018-03-23 ENCOUNTER — Other Ambulatory Visit: Payer: Self-pay | Admitting: Nurse Practitioner

## 2018-03-24 NOTE — Telephone Encounter (Signed)
Got a call from Santiago Glad at [facility] and she needs to speak with nurse about Death Certification being incorrect and family is very upset.  CB# [phone] Gildardo Pounds from [facility] Dept.

## 2018-03-24 NOTE — Telephone Encounter (Signed)
I spoke to Stephanie Short regarding death certificate in question. Line 28 Did tobacco use contribute to death was not filled out. I confirmed with Dr. Mortimer Fries that the answer was NO. I called Santiago Glad back and let her know. Nothing further needed at this time.

## 2018-03-25 ENCOUNTER — Encounter: Payer: 59 | Admitting: Nurse Practitioner
